# Patient Record
Sex: Male | Born: 1940 | Race: White | Hispanic: No | Smoking: Current every day smoker
Health system: Southern US, Community
[De-identification: ages and names within clinical notes are randomized; demographics above are authoritative.]

## PROBLEM LIST (undated history)

## (undated) DIAGNOSIS — K635 Polyp of colon: Secondary | ICD-10-CM

## (undated) DIAGNOSIS — Z923 Personal history of irradiation: Secondary | ICD-10-CM

## (undated) DIAGNOSIS — B029 Zoster without complications: Secondary | ICD-10-CM

## (undated) DIAGNOSIS — I10 Essential (primary) hypertension: Secondary | ICD-10-CM

## (undated) DIAGNOSIS — E785 Hyperlipidemia, unspecified: Secondary | ICD-10-CM

## (undated) DIAGNOSIS — C61 Malignant neoplasm of prostate: Secondary | ICD-10-CM

## (undated) DIAGNOSIS — G40909 Epilepsy, unspecified, not intractable, without status epilepticus: Secondary | ICD-10-CM

## (undated) HISTORY — PX: COLONOSCOPY: SHX174

## (undated) HISTORY — PX: OTHER SURGICAL HISTORY: SHX169

---

## 1999-07-07 ENCOUNTER — Ambulatory Visit (HOSPITAL_COMMUNITY): Admission: RE | Admit: 1999-07-07 | Discharge: 1999-07-07 | Payer: Self-pay | Admitting: Gastroenterology

## 2005-01-08 ENCOUNTER — Ambulatory Visit: Payer: Self-pay | Admitting: Internal Medicine

## 2005-02-20 ENCOUNTER — Ambulatory Visit: Payer: Self-pay | Admitting: Internal Medicine

## 2005-07-05 ENCOUNTER — Inpatient Hospital Stay (HOSPITAL_COMMUNITY): Admission: EM | Admit: 2005-07-05 | Discharge: 2005-07-06 | Payer: Self-pay | Admitting: Emergency Medicine

## 2005-07-05 ENCOUNTER — Ambulatory Visit: Payer: Self-pay | Admitting: Infectious Diseases

## 2005-07-09 ENCOUNTER — Encounter: Payer: Self-pay | Admitting: Interventional Radiology

## 2005-09-01 ENCOUNTER — Ambulatory Visit (HOSPITAL_COMMUNITY): Admission: RE | Admit: 2005-09-01 | Discharge: 2005-09-02 | Payer: Self-pay | Admitting: Interventional Radiology

## 2005-09-15 ENCOUNTER — Encounter: Payer: Self-pay | Admitting: Interventional Radiology

## 2006-01-11 ENCOUNTER — Ambulatory Visit (HOSPITAL_COMMUNITY): Admission: RE | Admit: 2006-01-11 | Discharge: 2006-01-11 | Payer: Self-pay | Admitting: Interventional Radiology

## 2010-07-06 ENCOUNTER — Encounter: Payer: Self-pay | Admitting: Interventional Radiology

## 2011-09-29 ENCOUNTER — Ambulatory Visit: Payer: Self-pay | Admitting: Radiation Oncology

## 2011-09-29 ENCOUNTER — Ambulatory Visit: Payer: Self-pay

## 2011-12-25 ENCOUNTER — Encounter: Payer: Self-pay | Admitting: Internal Medicine

## 2012-09-29 ENCOUNTER — Encounter: Payer: Self-pay | Admitting: Internal Medicine

## 2014-05-17 ENCOUNTER — Other Ambulatory Visit (HOSPITAL_COMMUNITY): Payer: Self-pay | Admitting: Urology

## 2014-05-17 DIAGNOSIS — C61 Malignant neoplasm of prostate: Secondary | ICD-10-CM

## 2014-06-21 ENCOUNTER — Ambulatory Visit (HOSPITAL_COMMUNITY)
Admission: RE | Admit: 2014-06-21 | Discharge: 2014-06-21 | Disposition: A | Discharge status: Home / Self Care | Payer: Medicare HMO | Source: Ambulatory Visit | Attending: Urology | Admitting: Urology

## 2014-06-21 DIAGNOSIS — K5730 Diverticulosis of large intestine without perforation or abscess without bleeding: Secondary | ICD-10-CM | POA: Insufficient documentation

## 2014-06-21 DIAGNOSIS — C61 Malignant neoplasm of prostate: Secondary | ICD-10-CM | POA: Insufficient documentation

## 2014-06-21 LAB — POCT I-STAT CREATININE: Creatinine, Ser: 1 mg/dL (ref 0.50–1.35)

## 2014-06-21 MED ORDER — GADOBENATE DIMEGLUMINE 529 MG/ML IV SOLN
20.0000 mL | Freq: Once | INTRAVENOUS | Status: AC | PRN
Start: 2014-06-21 — End: 2014-06-21
  Administered 2014-06-21: 18 mL via INTRAVENOUS

## 2014-06-27 HISTORY — PX: PROSTATE BIOPSY: SHX241

## 2014-07-12 ENCOUNTER — Ambulatory Visit: Payer: Medicare Other | Admitting: Radiation Oncology

## 2014-07-12 ENCOUNTER — Ambulatory Visit: Payer: Medicare Other

## 2014-07-17 ENCOUNTER — Ambulatory Visit: Payer: Medicare Other

## 2014-07-17 ENCOUNTER — Ambulatory Visit: Payer: Medicare Other | Admitting: Radiation Oncology

## 2014-07-19 ENCOUNTER — Encounter: Payer: Self-pay | Admitting: Radiation Oncology

## 2014-07-19 NOTE — Progress Notes (Signed)
GU Location of Tumor / Histology: Adenocarcinoma of the Prostate    If Prostate Cancer, Gleason Score is (3 + 4) and PSA is (10.8)    Past/Anticipated interventions by urology, if any: Biopsy of the Prostate  Past/Anticipated interventions by medical oncology, if any:   Weight changes, if any: Lost 7 lbs in the past 5 months - deliberate weight loss   Bowel/Bladder complaints, if any: Nocturia x 2-3, Intermittent urinary stream, denies dysuria  Nausea/Vomiting, if any: No  Pain issues, if any:  None  SAFETY ISSUES:  Prior radiation? NO  Pacemaker/ICD? NO  Possible current pregnancy? N/A  Is the patient on methotrexate? NO  Current Complaints / other details:  Scheduled for Bone Scan on 08/03/2016

## 2014-07-20 ENCOUNTER — Encounter: Payer: Self-pay | Admitting: Radiation Oncology

## 2014-07-23 ENCOUNTER — Other Ambulatory Visit (HOSPITAL_COMMUNITY): Payer: Self-pay | Admitting: Urology

## 2014-07-23 DIAGNOSIS — C61 Malignant neoplasm of prostate: Secondary | ICD-10-CM

## 2014-07-24 ENCOUNTER — Ambulatory Visit: Payer: Medicare HMO

## 2014-07-24 ENCOUNTER — Ambulatory Visit
Admission: RE | Admit: 2014-07-24 | Discharge: 2014-07-24 | Disposition: A | Discharge status: Home / Self Care | Payer: Medicare HMO | Source: Ambulatory Visit | Attending: Radiation Oncology | Admitting: Radiation Oncology

## 2014-07-24 ENCOUNTER — Encounter: Payer: Self-pay | Admitting: Radiation Oncology

## 2014-07-24 ENCOUNTER — Inpatient Hospital Stay: Admission: RE | Admit: 2014-07-24 | Payer: Medicare Other | Source: Ambulatory Visit | Admitting: Radiation Oncology

## 2014-07-24 ENCOUNTER — Institutional Professional Consult (permissible substitution): Payer: Medicare Other | Admitting: Radiation Oncology

## 2014-07-24 VITALS — BP 131/69 | HR 105 | Temp 98.3°F | Ht 69.0 in | Wt 188.9 lb

## 2014-07-24 DIAGNOSIS — C61 Malignant neoplasm of prostate: Secondary | ICD-10-CM | POA: Insufficient documentation

## 2014-07-24 HISTORY — DX: Malignant neoplasm of prostate: C61

## 2014-07-24 HISTORY — DX: Hyperlipidemia, unspecified: E78.5

## 2014-07-24 HISTORY — DX: Essential (primary) hypertension: I10

## 2014-07-24 HISTORY — DX: Polyp of colon: K63.5

## 2014-07-24 HISTORY — DX: Zoster without complications: B02.9

## 2014-07-24 NOTE — Progress Notes (Addendum)
[facility] NEW PATIENT EVALUATION  Name: Carlos Wise MRN: [id number]  Date:   07/24/2014           DOB: 1941-04-08  Status: outpatient   CC:   Carlos Wise, * Dr. Daiva Eves, Dr. 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 male who is seen today through the courtesy of Dr. 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  I send he was initially diagnosed with prostate cancer in March 2013 when he presented with an elevated PSA of 6.64.  He did one biopsy diagnostic for Gleason 7 (3+4) involving 50% of one core from the left lateral mid gland.  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  I see that his PSA was 8.2 in March 2014.  More recently, his PSA rose to 11.04 and he was seen by Dr. Gaynelle Arabian this past December.  MR of the prostate on 06/21/2014  showed a 1.6 cm irregular peripheral zone nodule in the left lateral mid gland consistent with a high-grade carcinoma.  There was no definite extra prostatic tumor extension, seminal vesicle involvement or pelvic metastatic disease.  On 06/27/2014  he underwent ultrasound-guided biopsies by Dr. Gaynelle Arabian finding Gleason 8 (4+4) involving 50% of one core from left lateral base, 70% of one core from the left base, 60%  each of 2 cores from left lateral mid gland. He also had Gleason 7 (4+3) involving less than 5% of one core from the left mid gland and 20% of one core from the left lateral apex. His gland volume was approximately 64.3 mL.  His staging workup includes a bone scan which is scheduled for February 19.  He is doing reasonably well from a GU and GI standpoint.  His I PSS score is 5.  He claims to be potent.  He was seen by Dr. Alinda Money last week to discuss robotic surgery.    PREVIOUS RADIATION THERAPY: No   PAST  MEDICAL HISTORY:  has a past medical history of Prostate cancer; Hyperlipemia; Herpes zoster; Hypertension; and Intestinal polyp.     PAST SURGICAL HISTORY:  Past Surgical History  Procedure Laterality Date  . Carotid artery catherization    . Prostate biopsy  06/27/2014     FAMILY HISTORY: family history includes Colon cancer in his mother; Heart attack in his father; Prostate cancer in his father.  His father was diagnosed with prostate cancer in his early 78s, and died of a heart attack at age 48.  His mother died from metastatic colon cancer and 36.    SOCIAL HISTORY:  reports that he has quit smoking. He does not have any smokeless tobacco history on file. He reports that he drinks alcohol. He reports that he does not use illicit drugs.  Married, no children.  Retired Hotel manager.    ALLERGIES: Penicillins   MEDICATIONS:  Current Outpatient Prescriptions  Medication Sig Dispense Refill  . amLODipine (NORVASC) 5 MG tablet Take 5 mg by mouth daily.    Marland Kitchen aspirin 81 MG tablet Take 81 mg by mouth daily.    . cholecalciferol (VITAMIN D) 400 UNITS TABS tablet Take 400 Units by mouth. Vitamin D3    . Cyanocobalamin (VITAMIN B-12 ER PO) Take by mouth.    . escitalopram (LEXAPRO) 10 MG tablet Take 10 mg  by mouth daily.    . Multiple Vitamins-Minerals (CENTRUM ADULTS PO) Take 1 tablet by mouth daily.    . simvastatin (ZOCOR) 40 MG tablet Take 40 mg by mouth daily.     No current facility-administered medications for this encounter.     REVIEW OF SYSTEMS:  Pertinent items are noted in HPI.    PHYSICAL EXAM:  height is 5\' 9"  (1.753 m) and weight is 188 lb 14.4 oz (85.684 kg). His temperature is 98.3 F (36.8 C). His blood pressure is 131/69 and his pulse is 105.   Head and neck examination: Grossly unremarkable.  Nodes: Without palpable cervical or supra-clavicular lymphadenopathy.  Chest: Lungs clear.  Abdomen: Without hepatomegaly.  Rectal:  the prostate gland is normal in size and is  without focal induration or nodularity.  Extremities: Without edema.     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 I explained to the patient and his wife that his prognosis is related to his stage, PSA level, and Gleason score.  His stage is favorable while his PSA level is of intermediate favorability and his Gleason score is unfavorable.  He has high-risk disease.  He is at significant risk for extraprostatic extension, seminal vesicle involvement and/or occult metastatic disease.  He will complete his staging workup with a bone scan of February 19.  We discussed management options which include robotic surgery versus radiation therapy.  Radiation therapy options include 5 weeks of external beam followed by seed implantation or 8 weeks of external beam/IMRT.  We discussed the potential acute and late toxicities of radiation therapy.  We also discussed the role of androgen deprivation therapy which is typically given for 2 years when given with radiation therapy.  We discussed bladder filling to minimize radiation related toxicity.  We discussed the fact that if he has surgery he would not require androgen deprivation therapy, and he would have a pathologic stage which could guide [place] with respect to need for possible adjuvant radiation therapy.  Radiation therapy with external beam/IMRT would obviously be more time-consuming but may be better tolerated considering his age of 7.  I do recommend androgen deprivation therapy and we discussed the timing of radiation therapy which would not be until late spring.  Lastly, we discussed placement of 3 gold seed markers for image guidance.  He was given information for review.      PLAN: As discussed above.  He will contact me if he wants to consider  radiation therapy.  Otherwise, he will make plans for surgery with Dr. Alinda Money.   I spent 60  minutes face to face with the patient and more than 50% of that time was spent in counseling and/or coordination of care.

## 2014-07-24 NOTE — Addendum Note (Signed)
Encounter addended by: Deirdre Evener, RN on: 07/24/2014  5:15 PM<BR>     Documentation filed: Charges VN

## 2014-07-24 NOTE — Addendum Note (Signed)
Encounter addended by: Deirdre Evener, RN on: 07/24/2014  6:37 PM<BR>     Documentation filed: Inpatient Document Flowsheet, Flowsheet VN, Charges VN

## 2014-08-03 ENCOUNTER — Encounter (HOSPITAL_COMMUNITY)
Admission: RE | Admit: 2014-08-03 | Discharge: 2014-08-03 | Disposition: A | Discharge status: Home / Self Care | Payer: Medicare HMO | Source: Ambulatory Visit | Attending: Urology | Admitting: Urology

## 2014-08-03 DIAGNOSIS — C61 Malignant neoplasm of prostate: Secondary | ICD-10-CM | POA: Insufficient documentation

## 2014-08-14 ENCOUNTER — Other Ambulatory Visit (HOSPITAL_COMMUNITY): Payer: Self-pay | Admitting: Urology

## 2014-08-14 DIAGNOSIS — R948 Abnormal results of function studies of other organs and systems: Secondary | ICD-10-CM

## 2014-08-28 ENCOUNTER — Other Ambulatory Visit (HOSPITAL_COMMUNITY): Payer: Self-pay | Admitting: Urology

## 2014-08-28 ENCOUNTER — Ambulatory Visit (HOSPITAL_COMMUNITY)
Admission: RE | Admit: 2014-08-28 | Discharge: 2014-08-28 | Disposition: A | Discharge status: Home / Self Care | Payer: Medicare HMO | Source: Ambulatory Visit | Attending: Urology | Admitting: Urology

## 2014-08-28 DIAGNOSIS — R948 Abnormal results of function studies of other organs and systems: Secondary | ICD-10-CM | POA: Insufficient documentation

## 2014-08-28 DIAGNOSIS — C61 Malignant neoplasm of prostate: Secondary | ICD-10-CM | POA: Diagnosis present

## 2014-08-28 LAB — POCT I-STAT CREATININE: CREATININE: 1 mg/dL (ref 0.50–1.35)

## 2014-08-28 MED ORDER — GADOBENATE DIMEGLUMINE 529 MG/ML IV SOLN
15.0000 mL | Freq: Once | INTRAVENOUS | Status: AC | PRN
Start: 2014-08-28 — End: 2014-08-28
  Administered 2014-08-28: 15 mL via INTRAVENOUS

## 2014-09-05 ENCOUNTER — Encounter: Payer: Self-pay | Admitting: Radiation Oncology

## 2014-09-05 ENCOUNTER — Telehealth: Payer: Self-pay | Admitting: *Deleted

## 2014-09-05 NOTE — Telephone Encounter (Signed)
CALLED PATIENT TO INFORM OF [facility] APPT. FOR 11-07-14, LVM FOR A RETURN CALL

## 2014-09-05 NOTE — Progress Notes (Signed)
Chart note: Dr. Arlyn Leak nurse, Carlos Wise, called to tell me that [name] wanted to proceed with external beam/IMRT.  I spoke with Carlos Wise, and he is scheduled for a Vantus implant in early April.  He will also place his 3 gold seed markers at the same time.  I will see Carlos Wise back for a follow-up visit in 2 months and go through the planning process for external beam/IMRT.

## 2014-09-21 ENCOUNTER — Telehealth: Payer: Self-pay | Admitting: *Deleted

## 2014-09-21 NOTE — Telephone Encounter (Signed)
CALLED PATIENT TO INFORM THAT APPT. HAS BEEN MOVED TO 11-14-14 - 1:30 PM  FOR 2:00 PM APPT., LVM FOR A RETURN  CALL

## 2014-10-03 ENCOUNTER — Encounter: Payer: Self-pay | Admitting: Internal Medicine

## 2014-11-07 ENCOUNTER — Ambulatory Visit: Payer: Medicare HMO | Admitting: Radiation Oncology

## 2014-11-07 ENCOUNTER — Ambulatory Visit: Payer: Medicare HMO

## 2014-11-14 ENCOUNTER — Ambulatory Visit: Payer: Medicare HMO | Admitting: Radiation Oncology

## 2014-11-14 ENCOUNTER — Ambulatory Visit: Payer: Medicare HMO

## 2014-11-15 ENCOUNTER — Ambulatory Visit: Payer: Medicare HMO | Admitting: Radiation Oncology

## 2014-11-20 ENCOUNTER — Ambulatory Visit
Admission: RE | Admit: 2014-11-20 | Discharge: 2014-11-20 | Disposition: A | Discharge status: Home / Self Care | Payer: Medicare HMO | Source: Ambulatory Visit | Attending: Radiation Oncology | Admitting: Radiation Oncology

## 2014-11-20 ENCOUNTER — Encounter: Payer: Self-pay | Admitting: Radiation Oncology

## 2014-11-20 VITALS — BP 137/78 | HR 97 | Temp 99.0°F | Ht 69.0 in | Wt 195.7 lb

## 2014-11-20 DIAGNOSIS — C61 Malignant neoplasm of prostate: Secondary | ICD-10-CM

## 2014-11-20 NOTE — Progress Notes (Signed)
CC: Dr. Katrine Coho, Dr. Daiva Eves  Follow-up note:  Diagnosis: Stage TIc high-risk adenocarcinoma prostate  History: [name] is a pleasant 74 year old male who is seen today for review and scheduling of his radiation therapy in the management of his stage TIc high-risk adenocarcinoma prostate.  I first saw the patient in consultation on 07/24/2014. He was initially diagnosed with prostate cancer in March 2013 when he presented with an elevated PSA of 6.64. He did one biopsy diagnostic for Gleason 7 (3+4) involving 50% of one core from the left lateral mid gland. 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 I see that his PSA was 8.2 in March 2014. More recently, his PSA rose to 11.04 and he was seen by Dr. Gaynelle Arabian this past December. MR of the prostate on 06/21/2014 showed a 1.6 cm irregular peripheral zone nodule in the left lateral mid gland consistent with a high-grade carcinoma. There was no definite extra prostatic tumor extension, seminal vesicle involvement or pelvic metastatic disease. On 06/27/2014 he underwent ultrasound-guided biopsies by Dr. Gaynelle Arabian finding Gleason 8 (4+4) involving 50% of one core from left lateral base, 70% of one core from the left base, 60% each of 2 cores from left lateral mid gland. He also had Gleason 7 (4+3) involving less than 5% of one core from the left mid gland and 20% of one core from the left lateral apex. His gland volume was approximately 64.3 mL.His staging workup included negative bone scan on 08/03/2014.  He elected for external beam along with androgen deprivation therapy for 2 years.  He has been doing reasonably well from a GU and GI standpoint.  On 09/25/2014 he had placement of a Vantas implant along his proximal left upper extremity and also placement of 3 gold seed markers for image guidance.  He states that he has minimal hot flashes and only slight fatigue.  He is still having erections.  Physical  examination: Alert and oriented. Filed Vitals:   11/20/14 1230  BP: 137/78  Pulse: 97  Temp: 99 F (37.2 C)   Rectal examination not performed today.  Impression: Stage TIc high-risk adenocarcinoma prostate.  We again reviewed his management options and he confirms his desire to undergo 8 weeks of external beam/IMRT along with androgen deprivation therapy.  I would like to wait until the end of June before beginning his radiation therapy.  This will be her most 3 months since placement of his implant.  I will have him return for CT simulation on June 20.  We discussed the potential acute and late toxicities of radiation therapy.  We discussed bladder filling to minimize urinary treatment-related toxicity.  Plan: As above.  30 minutes was spent face-to-face with the patient, primarily counseling patient and coordinating his care.

## 2014-11-20 NOTE — Progress Notes (Signed)
Carlos Wise is here for reassessment to start radiation therapy. He had placement of his 3 Gold seeds and the [employer] Implant.  He denies any hot flashes, fatigue, nor muscle aches since Vantas implant.

## 2014-12-03 ENCOUNTER — Ambulatory Visit
Admission: RE | Admit: 2014-12-03 | Discharge: 2014-12-03 | Disposition: A | Discharge status: Home / Self Care | Payer: Medicare HMO | Source: Ambulatory Visit | Attending: Radiation Oncology | Admitting: Radiation Oncology

## 2014-12-03 DIAGNOSIS — C61 Malignant neoplasm of prostate: Secondary | ICD-10-CM | POA: Diagnosis not present

## 2014-12-03 NOTE — Progress Notes (Addendum)
Complex simulation/treatment planning note: The patient was taken to the CT simulator. He was placed supine on the CT simulator table. A custom Vac lock immobilization device was constructed. A red rubber tube was placed within the rectal vault. He was then catheterized and contrast instilled into the bladder/urethra. He was then scanned . I chose an isocenter along the central prostate. The CT data set was sent to the MIM Planning system where I contoured his prostate, seminal vesicles, bladder, rectum, and lower rectosigmoid colon. He is now ready for IMRT simulation/treatment planning. I am prescribing 7800 cGy to his prostate PTV which represents the prostate +0.8 cm except for 0.5 cm along the rectum. I prescribing 5600 cGy in 40 sessions to his seminal vesicles which includes the seminal vesicles +0.5 cm.

## 2014-12-04 ENCOUNTER — Encounter: Payer: Self-pay | Admitting: Radiation Oncology

## 2014-12-04 DIAGNOSIS — C61 Malignant neoplasm of prostate: Secondary | ICD-10-CM | POA: Diagnosis not present

## 2014-12-04 NOTE — Progress Notes (Signed)
IMRT simulation/treatment planning note: The patient completed his IMRT treatment planning today.  IMRT was chosen to decrease the risk for both acute and late bladder and rectal toxicity compared to conventional or 3-D conformal radiation therapy.  Dose volume histograms were obtained for the bladder, rectum, and femoral heads in addition to the prostate PTV and seminal vesicle PTV.  We met our departmental guidelines.  I'm prescribing 7800 cGy in 40 sessions to his prostate PTV and 5600 cGy in 40 sessions to his seminal vesicle PTV.  He is being treated with 6 MV photons, dual ARC VMAT IMRT.

## 2014-12-06 ENCOUNTER — Ambulatory Visit: Payer: Medicare HMO | Admitting: Radiation Oncology

## 2014-12-12 ENCOUNTER — Ambulatory Visit
Admission: RE | Admit: 2014-12-12 | Discharge: 2014-12-12 | Disposition: A | Discharge status: Home / Self Care | Payer: Medicare HMO | Source: Ambulatory Visit | Attending: Radiation Oncology | Admitting: Radiation Oncology

## 2014-12-12 DIAGNOSIS — C61 Malignant neoplasm of prostate: Secondary | ICD-10-CM | POA: Diagnosis not present

## 2014-12-13 ENCOUNTER — Ambulatory Visit
Admission: RE | Admit: 2014-12-13 | Discharge: 2014-12-13 | Disposition: A | Discharge status: Home / Self Care | Payer: Medicare HMO | Source: Ambulatory Visit | Attending: Radiation Oncology | Admitting: Radiation Oncology

## 2014-12-13 DIAGNOSIS — C61 Malignant neoplasm of prostate: Secondary | ICD-10-CM | POA: Diagnosis not present

## 2014-12-14 ENCOUNTER — Ambulatory Visit
Admission: RE | Admit: 2014-12-14 | Discharge: 2014-12-14 | Disposition: A | Discharge status: Home / Self Care | Payer: Medicare HMO | Source: Ambulatory Visit | Attending: Radiation Oncology | Admitting: Radiation Oncology

## 2014-12-14 DIAGNOSIS — C61 Malignant neoplasm of prostate: Secondary | ICD-10-CM | POA: Diagnosis not present

## 2014-12-18 ENCOUNTER — Ambulatory Visit
Admission: RE | Admit: 2014-12-18 | Discharge: 2014-12-18 | Disposition: A | Discharge status: Home / Self Care | Payer: Medicare HMO | Source: Ambulatory Visit | Attending: Radiation Oncology | Admitting: Radiation Oncology

## 2014-12-18 ENCOUNTER — Encounter: Payer: Self-pay | Admitting: Radiation Oncology

## 2014-12-18 VITALS — BP 140/76 | HR 97 | Temp 98.6°F | Resp 16 | Ht 69.0 in | Wt 193.9 lb

## 2014-12-18 DIAGNOSIS — C61 Malignant neoplasm of prostate: Secondary | ICD-10-CM

## 2014-12-18 NOTE — Progress Notes (Signed)
Brodey Bonn has completed 4 fractions to his prostate.  He denies pain and hematuria.  He reports occasional dysuria, every "1 out of 5 times I urinate."  He said this started recently.  He reports nocturia 3 times a night.    BP 140/76 mmHg  Pulse 97  Temp(Src) 98.6 F (37 C) (Oral)  Resp 16  Ht 5\' 9"  (1.753 m)  Wt 193 lb 14.4 oz (87.952 kg)  BMI 28.62 kg/m2

## 2014-12-18 NOTE — Progress Notes (Signed)
Weekly Management Note:  Site: Prostate Current Dose:  780  cGy Projected Dose: 7800  cGy  Narrative: The patient is seen today for routine under treatment assessment. CBCT/MVCT images/port films were reviewed. The chart was reviewed.   Bladder filling is satisfactory.  No new GU or GI difficulties.  He does have occasional dysuria which I do not feel is related to his radiation therapy.  He is also having nocturia 3.  Physical Examination:  Filed Vitals:   12/18/14 1105  BP: 140/76  Pulse: 97  Temp: 98.6 F (37 C)  Resp: 16  .  Weight: 193 lb 14.4 oz (87.952 kg).  No change.  Impression: Tolerating radiation therapy well.  I reviewed his cone beam CT scans with him to encouraged comfortable bladder filling.  Plan: Continue radiation therapy as planned.

## 2014-12-18 NOTE — Progress Notes (Signed)
Pt here for patient teaching.  Pt given Radiation and You booklet. Reviewed areas of pertinence such as diarrhea, fatigue, skin changes and urinary and bladder changes . Pt able to give teach back of to pat skin and use unscented/gentle soap,avoid applying anything to skin within 4 hours of treatment. Pt demonstrated understanding and verbalizes understanding of information given and will contact nursing with any questions or concerns.

## 2014-12-19 ENCOUNTER — Ambulatory Visit
Admission: RE | Admit: 2014-12-19 | Discharge: 2014-12-19 | Disposition: A | Discharge status: Home / Self Care | Payer: Medicare HMO | Source: Ambulatory Visit | Attending: Radiation Oncology | Admitting: Radiation Oncology

## 2014-12-19 DIAGNOSIS — C61 Malignant neoplasm of prostate: Secondary | ICD-10-CM | POA: Diagnosis not present

## 2014-12-20 ENCOUNTER — Ambulatory Visit
Admission: RE | Admit: 2014-12-20 | Discharge: 2014-12-20 | Disposition: A | Discharge status: Home / Self Care | Payer: Medicare HMO | Source: Ambulatory Visit | Attending: Radiation Oncology | Admitting: Radiation Oncology

## 2014-12-20 DIAGNOSIS — C61 Malignant neoplasm of prostate: Secondary | ICD-10-CM | POA: Diagnosis not present

## 2014-12-21 ENCOUNTER — Ambulatory Visit
Admission: RE | Admit: 2014-12-21 | Discharge: 2014-12-21 | Disposition: A | Discharge status: Home / Self Care | Payer: Medicare HMO | Source: Ambulatory Visit | Attending: Radiation Oncology | Admitting: Radiation Oncology

## 2014-12-21 DIAGNOSIS — C61 Malignant neoplasm of prostate: Secondary | ICD-10-CM | POA: Diagnosis not present

## 2014-12-24 ENCOUNTER — Ambulatory Visit
Admission: RE | Admit: 2014-12-24 | Discharge: 2014-12-24 | Disposition: A | Discharge status: Home / Self Care | Payer: Medicare HMO | Source: Ambulatory Visit | Attending: Radiation Oncology | Admitting: Radiation Oncology

## 2014-12-24 VITALS — BP 125/70 | HR 101 | Temp 98.1°F | Resp 10 | Wt 194.1 lb

## 2014-12-24 DIAGNOSIS — C61 Malignant neoplasm of prostate: Secondary | ICD-10-CM | POA: Diagnosis not present

## 2014-12-24 NOTE — Progress Notes (Signed)
PAIN: He is currently in no pain. URINARY: Pt reports urinary frequency. Pt states they urinate 2 - 3 times per night.  BOWEL: Pt reports a soft bowel movement everyday/everyother day. OTHER: Pt complains of fatigue.  BP 125/70 mmHg  Pulse 101  Temp(Src) 98.1 F (36.7 C) (Oral)  Resp 10  Wt 194 lb 1.6 oz (88.043 kg)  SpO2 98% Wt Readings from Last 3 Encounters:  12/24/14 194 lb 1.6 oz (88.043 kg)  12/18/14 193 lb 14.4 oz (87.952 kg)  11/20/14 195 lb 11.2 oz (88.769 kg)

## 2014-12-24 NOTE — Progress Notes (Signed)
   Weekly Management Note:  outpatient    ICD-9-CM ICD-10-CM   1. Malignant neoplasm of prostate 185 C61     Current Dose:  15.6 Gy  Projected Dose: 78 Gy   Narrative:  The patient presents for routine under treatment assessment.  CBCT/MVCT images/Port film x-rays were reviewed.  The chart was checked. Doing well. No new complaints   Physical Findings:  weight is 194 lb 1.6 oz (88.043 kg). His oral temperature is 98.1 F (36.7 C). His blood pressure is 125/70 and his pulse is 101. His respiration is 10 and oxygen saturation is 98%.   Wt Readings from Last 3 Encounters:  12/24/14 194 lb 1.6 oz (88.043 kg)  12/18/14 193 lb 14.4 oz (87.952 kg)  11/20/14 195 lb 11.2 oz (88.769 kg)   NAD  Impression:  The patient is tolerating radiotherapy.  Plan:  Continue radiotherapy as planned.    ________________________________   Eppie Gibson, M.D.

## 2014-12-25 ENCOUNTER — Ambulatory Visit
Admission: RE | Admit: 2014-12-25 | Discharge: 2014-12-25 | Disposition: A | Discharge status: Home / Self Care | Payer: Medicare HMO | Source: Ambulatory Visit | Attending: Radiation Oncology | Admitting: Radiation Oncology

## 2014-12-25 DIAGNOSIS — C61 Malignant neoplasm of prostate: Secondary | ICD-10-CM | POA: Diagnosis not present

## 2014-12-26 ENCOUNTER — Ambulatory Visit
Admission: RE | Admit: 2014-12-26 | Discharge: 2014-12-26 | Disposition: A | Discharge status: Home / Self Care | Payer: Medicare HMO | Source: Ambulatory Visit | Attending: Radiation Oncology | Admitting: Radiation Oncology

## 2014-12-26 DIAGNOSIS — C61 Malignant neoplasm of prostate: Secondary | ICD-10-CM | POA: Diagnosis not present

## 2014-12-27 ENCOUNTER — Ambulatory Visit
Admission: RE | Admit: 2014-12-27 | Discharge: 2014-12-27 | Disposition: A | Discharge status: Home / Self Care | Payer: Medicare HMO | Source: Ambulatory Visit | Attending: Radiation Oncology | Admitting: Radiation Oncology

## 2014-12-27 DIAGNOSIS — C61 Malignant neoplasm of prostate: Secondary | ICD-10-CM | POA: Diagnosis not present

## 2014-12-28 ENCOUNTER — Ambulatory Visit
Admission: RE | Admit: 2014-12-28 | Discharge: 2014-12-28 | Disposition: A | Discharge status: Home / Self Care | Payer: Medicare HMO | Source: Ambulatory Visit | Attending: Radiation Oncology | Admitting: Radiation Oncology

## 2014-12-28 DIAGNOSIS — C61 Malignant neoplasm of prostate: Secondary | ICD-10-CM | POA: Diagnosis not present

## 2014-12-31 ENCOUNTER — Ambulatory Visit
Admission: RE | Admit: 2014-12-31 | Discharge: 2014-12-31 | Disposition: A | Discharge status: Home / Self Care | Payer: Medicare HMO | Source: Ambulatory Visit | Attending: Radiation Oncology | Admitting: Radiation Oncology

## 2014-12-31 VITALS — BP 132/72 | HR 98 | Temp 98.5°F | Resp 12 | Wt 192.6 lb

## 2014-12-31 DIAGNOSIS — C61 Malignant neoplasm of prostate: Secondary | ICD-10-CM

## 2014-12-31 NOTE — Progress Notes (Signed)
Weekly Management Note:  Site: Prostate Current Dose:  2535  cGy Projected Dose: 7800  cGy  Narrative: The patient is seen today for routine under treatment assessment. CBCT/MVCT images/port films were reviewed. The chart was reviewed.   Bladder filling is satisfactory.  No new GU or GI difficulties.  Physical Examination:  Filed Vitals:   12/31/14 1019  BP: 132/72  Pulse: 98  Temp: 98.5 F (36.9 C)  Resp: 12  .  Weight: 192 lb 9.6 oz (87.363 kg).  No change.  Impression: Tolerating radiation therapy well.  Plan: Continue radiation therapy as planned.

## 2014-12-31 NOTE — Progress Notes (Signed)
PAIN: He is currently in no pain.  URINARY: Pt states they urinate 2 - 3 times per night.  BOWEL: Pt reports a soft bowel movement everyday/everyother day.  BP 132/72 mmHg  Pulse 98  Temp(Src) 98.5 F (36.9 C) (Oral)  Resp 12  Wt 192 lb 9.6 oz (87.363 kg)  SpO2 100% Wt Readings from Last 3 Encounters:  12/31/14 192 lb 9.6 oz (87.363 kg)  12/24/14 194 lb 1.6 oz (88.043 kg)  12/18/14 193 lb 14.4 oz (87.952 kg)

## 2015-01-01 ENCOUNTER — Ambulatory Visit
Admission: RE | Admit: 2015-01-01 | Discharge: 2015-01-01 | Disposition: A | Discharge status: Home / Self Care | Payer: Medicare HMO | Source: Ambulatory Visit | Attending: Radiation Oncology | Admitting: Radiation Oncology

## 2015-01-01 DIAGNOSIS — C61 Malignant neoplasm of prostate: Secondary | ICD-10-CM | POA: Diagnosis not present

## 2015-01-02 ENCOUNTER — Ambulatory Visit
Admission: RE | Admit: 2015-01-02 | Discharge: 2015-01-02 | Disposition: A | Discharge status: Home / Self Care | Payer: Medicare HMO | Source: Ambulatory Visit | Attending: Radiation Oncology | Admitting: Radiation Oncology

## 2015-01-02 DIAGNOSIS — C61 Malignant neoplasm of prostate: Secondary | ICD-10-CM | POA: Diagnosis not present

## 2015-01-03 ENCOUNTER — Ambulatory Visit
Admission: RE | Admit: 2015-01-03 | Discharge: 2015-01-03 | Disposition: A | Discharge status: Home / Self Care | Payer: Medicare HMO | Source: Ambulatory Visit | Attending: Radiation Oncology | Admitting: Radiation Oncology

## 2015-01-03 DIAGNOSIS — C61 Malignant neoplasm of prostate: Secondary | ICD-10-CM | POA: Diagnosis not present

## 2015-01-04 ENCOUNTER — Ambulatory Visit
Admission: RE | Admit: 2015-01-04 | Discharge: 2015-01-04 | Disposition: A | Discharge status: Home / Self Care | Payer: Medicare HMO | Source: Ambulatory Visit | Attending: Radiation Oncology | Admitting: Radiation Oncology

## 2015-01-04 DIAGNOSIS — C61 Malignant neoplasm of prostate: Secondary | ICD-10-CM | POA: Diagnosis not present

## 2015-01-07 ENCOUNTER — Ambulatory Visit
Admission: RE | Admit: 2015-01-07 | Discharge: 2015-01-07 | Disposition: A | Discharge status: Home / Self Care | Payer: Medicare HMO | Source: Ambulatory Visit | Attending: Radiation Oncology | Admitting: Radiation Oncology

## 2015-01-07 ENCOUNTER — Encounter: Payer: Self-pay | Admitting: Radiation Oncology

## 2015-01-07 VITALS — BP 131/77 | HR 90 | Temp 98.7°F | Resp 20 | Ht 69.0 in | Wt 192.4 lb

## 2015-01-07 DIAGNOSIS — C61 Malignant neoplasm of prostate: Secondary | ICD-10-CM | POA: Diagnosis not present

## 2015-01-07 NOTE — Progress Notes (Signed)
Weekly Management Note:  Site: prostate Current Dose:   3510  cGy Projected Dose:  7800  cGy  Narrative: The patient is seen today for routine under treatment assessment. CBCT/MVCT images/port films were reviewed. The chart was reviewed.    Bladder filling is satisfactory. He does have nocturia 2-4 along with some urinary frequency.  His stream is occasionally weak. No GI difficulties.  Physical Examination:  Filed Vitals:   01/07/15 1029  BP: 131/77  Pulse: 90  Temp: 98.7 F (37.1 C)  Resp: 20  .  Weight: 192 lb 6.4 oz (87.272 kg).  No change.  Impression: Tolerating radiation therapy well.  He may benefit from an alpha blocker but he would like to avoid this for the time being.  Plan: Continue radiation therapy as planned.

## 2015-01-07 NOTE — Progress Notes (Signed)
Carlos Wise has compelled 18 fractions to his prostate.  He denies pain.  He reports occasional burning with urination.  He reports urinary frequency.  He reports getting up 2-4 times per night to urinate.  He reports his urinary stream is occasionally weak.  He denies hematuria.  He denies diarrhea and fatigue.  BP 131/77 mmHg  Pulse 90  Temp(Src) 98.7 F (37.1 C) (Oral)  Resp 20  Ht 5\' 9"  (1.753 m)  Wt 192 lb 6.4 oz (87.272 kg)  BMI 28.40 kg/m2

## 2015-01-08 ENCOUNTER — Ambulatory Visit
Admission: RE | Admit: 2015-01-08 | Discharge: 2015-01-08 | Disposition: A | Discharge status: Home / Self Care | Payer: Medicare HMO | Source: Ambulatory Visit | Attending: Radiation Oncology | Admitting: Radiation Oncology

## 2015-01-08 DIAGNOSIS — C61 Malignant neoplasm of prostate: Secondary | ICD-10-CM | POA: Diagnosis not present

## 2015-01-09 ENCOUNTER — Ambulatory Visit
Admission: RE | Admit: 2015-01-09 | Discharge: 2015-01-09 | Disposition: A | Discharge status: Home / Self Care | Payer: Medicare HMO | Source: Ambulatory Visit | Attending: Radiation Oncology | Admitting: Radiation Oncology

## 2015-01-09 DIAGNOSIS — C61 Malignant neoplasm of prostate: Secondary | ICD-10-CM | POA: Diagnosis not present

## 2015-01-10 ENCOUNTER — Ambulatory Visit
Admission: RE | Admit: 2015-01-10 | Discharge: 2015-01-10 | Disposition: A | Discharge status: Home / Self Care | Payer: Medicare HMO | Source: Ambulatory Visit | Attending: Radiation Oncology | Admitting: Radiation Oncology

## 2015-01-10 DIAGNOSIS — C61 Malignant neoplasm of prostate: Secondary | ICD-10-CM | POA: Diagnosis not present

## 2015-01-11 ENCOUNTER — Ambulatory Visit
Admission: RE | Admit: 2015-01-11 | Discharge: 2015-01-11 | Disposition: A | Discharge status: Home / Self Care | Payer: Medicare HMO | Source: Ambulatory Visit | Attending: Radiation Oncology | Admitting: Radiation Oncology

## 2015-01-11 ENCOUNTER — Encounter: Payer: Self-pay | Admitting: *Deleted

## 2015-01-11 VITALS — BP 139/65 | HR 97 | Temp 98.4°F | Resp 12 | Wt 195.1 lb

## 2015-01-11 DIAGNOSIS — C61 Malignant neoplasm of prostate: Secondary | ICD-10-CM | POA: Diagnosis not present

## 2015-01-11 MED ORDER — TAMSULOSIN HCL 0.4 MG PO CAPS: 0.4000 mg | ORAL_CAPSULE | Freq: Every day | ORAL | Status: DC

## 2015-01-11 NOTE — Progress Notes (Signed)
PAIN: He is currently in no pain. URINARY: Pt reports urinary frequency, urgency, incontinence and dribbling. Pt states they urinate 9 times.  BOWEL: Pt reports a soft bowel movement everyday/everyother day. OTHER: Not taking Flomax BP 139/65 mmHg  Pulse 97  Temp(Src) 98.4 F (36.9 C) (Oral)  Resp 12  Wt 195 lb 1.6 oz (88.497 kg)  SpO2 99% Wt Readings from Last 3 Encounters:  01/11/15 195 lb 1.6 oz (88.497 kg)  01/07/15 192 lb 6.4 oz (87.272 kg)  12/31/14 192 lb 9.6 oz (87.363 kg)

## 2015-01-11 NOTE — Progress Notes (Signed)
   Department of Radiation Oncology  Phone:  [phone] Fax:        [phone]  Weekly Treatment Note    Name: Carlos Wise Date: 01/11/2015 MRN: [id number] DOB: February 14, 1941   Current dose: 40.95 Gy  Current fraction: 21   MEDICATIONS: Current Outpatient Prescriptions  Medication Sig Dispense Refill  . amLODipine (NORVASC) 5 MG tablet Take 5 mg by mouth daily.    Marland Kitchen aspirin 81 MG tablet Take 81 mg by mouth daily.    . cholecalciferol (VITAMIN D) 400 UNITS TABS tablet Take 400 Units by mouth. Vitamin D3    . Cyanocobalamin (VITAMIN B-12 ER PO) Take by mouth.    . escitalopram (LEXAPRO) 10 MG tablet Take 10 mg by mouth daily.    . Multiple Vitamins-Minerals (CENTRUM ADULTS PO) Take 1 tablet by mouth daily.    . simvastatin (ZOCOR) 40 MG tablet Take 40 mg by mouth daily.     No current facility-administered medications for this encounter.     ALLERGIES: Penicillins   LABORATORY DATA:  No results found for: WBC, HGB, HCT, MCV, PLT No results found for: NA, K, CL, CO2 No results found for: ALT, AST, GGT, ALKPHOS, BILITOT   NARRATIVE: SAMIR ISHAQ was seen today for weekly treatment management. The chart was checked and the patient's films were reviewed. PAIN:  He is currently in no pain.  URINARY:  Pt reports urinary frequency, urgency, incontinence and dribbling. Pt states nocturia x 9. BOWEL:  Pt reports a soft bowel movement everyday or every other day.  OTHER:  Not taking Flomax  PHYSICAL EXAMINATION: weight is 195 lb 1.6 oz (88.497 kg). His oral temperature is 98.4 F (36.9 C). His blood pressure is 139/65 and his pulse is 97. His respiration is 12 and oxygen saturation is 99%.  Alert and oriented times three.  ASSESSMENT: The patient is doing satisfactorily with treatment.  PLAN: We will continue with the patient's radiation treatment as planned. I will prescribe Flomax for the pt's urinary symptoms. The pt uses [facility].  This document  serves as a record of services personally performed by Kyung Rudd, MD. It was created on his behalf by Darcus Austin, a trained medical scribe. The creation of this record is based on the scribe's personal observations and the provider's statements to them. This document has been checked and approved by the attending provider.

## 2015-01-11 NOTE — Progress Notes (Signed)
Received call from pt stating having problems going to the bathroom constantly and he doesn't know if he can come in for his treatment today.  Called pt.  Pt reports they have voided 9 times since midnight to 8.  Encouraged pt to try and come in so he could be seen by one of our physicians. He said he lives 45 minutes away and will be here as soon as he can.  Notified Dr. Lisbeth Renshaw and PUT appointment scheduled.

## 2015-01-14 ENCOUNTER — Ambulatory Visit
Admission: RE | Admit: 2015-01-14 | Discharge: 2015-01-14 | Disposition: A | Discharge status: Home / Self Care | Payer: Medicare HMO | Source: Ambulatory Visit | Attending: Radiation Oncology | Admitting: Radiation Oncology

## 2015-01-14 DIAGNOSIS — C61 Malignant neoplasm of prostate: Secondary | ICD-10-CM | POA: Diagnosis not present

## 2015-01-15 ENCOUNTER — Ambulatory Visit
Admission: RE | Admit: 2015-01-15 | Discharge: 2015-01-15 | Disposition: A | Discharge status: Home / Self Care | Payer: Medicare HMO | Source: Ambulatory Visit | Attending: Radiation Oncology | Admitting: Radiation Oncology

## 2015-01-15 ENCOUNTER — Encounter: Payer: Self-pay | Admitting: Radiation Oncology

## 2015-01-15 VITALS — BP 135/76 | HR 96 | Temp 98.2°F | Resp 20 | Wt 194.8 lb

## 2015-01-15 DIAGNOSIS — C61 Malignant neoplasm of prostate: Secondary | ICD-10-CM

## 2015-01-15 NOTE — Progress Notes (Signed)
Weekly rad txs 24/40 completed, flomax has helped a lot stated patient, nocturia x2, no dysuria,hematuria, bowels normal, appetite good, mild fatigue  10:18 AM BP 135/76 mmHg  Pulse 96  Temp(Src) 98.2 F (36.8 C) (Oral)  Resp 20  Wt 194 lb 12.8 oz (88.361 kg)  Wt Readings from Last 3 Encounters:  01/15/15 194 lb 12.8 oz (88.361 kg)  01/11/15 195 lb 1.6 oz (88.497 kg)  01/07/15 192 lb 6.4 oz (87.272 kg)

## 2015-01-15 NOTE — Progress Notes (Signed)
Weekly Management Note:  Site: Prostate Current Dose:  4680  cGy Projected Dose: 7800  cGy  Narrative: The patient is seen today for routine under treatment assessment. CBCT/MVCT images/port films were reviewed. The chart was reviewed.   Bladder filling satisfactory.  No new GU or GI difficulty.  Tamsulosin has been quite helpful.  Physical Examination:  Filed Vitals:   01/15/15 1016  BP: 135/76  Pulse: 96  Temp: 98.2 F (36.8 C)  Resp: 20  .  Weight: 194 lb 12.8 oz (88.361 kg).  No change.  Impression: Tolerating radiation therapy well.  Plan: Continue radiation therapy as planned.

## 2015-01-16 ENCOUNTER — Ambulatory Visit
Admission: RE | Admit: 2015-01-16 | Discharge: 2015-01-16 | Disposition: A | Discharge status: Home / Self Care | Payer: Medicare HMO | Source: Ambulatory Visit | Attending: Radiation Oncology | Admitting: Radiation Oncology

## 2015-01-16 DIAGNOSIS — C61 Malignant neoplasm of prostate: Secondary | ICD-10-CM | POA: Diagnosis not present

## 2015-01-17 ENCOUNTER — Ambulatory Visit
Admission: RE | Admit: 2015-01-17 | Discharge: 2015-01-17 | Disposition: A | Discharge status: Home / Self Care | Payer: Medicare HMO | Source: Ambulatory Visit | Attending: Radiation Oncology | Admitting: Radiation Oncology

## 2015-01-17 DIAGNOSIS — C61 Malignant neoplasm of prostate: Secondary | ICD-10-CM | POA: Diagnosis not present

## 2015-01-18 ENCOUNTER — Ambulatory Visit
Admission: RE | Admit: 2015-01-18 | Discharge: 2015-01-18 | Disposition: A | Discharge status: Home / Self Care | Payer: Medicare HMO | Source: Ambulatory Visit | Attending: Radiation Oncology | Admitting: Radiation Oncology

## 2015-01-18 DIAGNOSIS — C61 Malignant neoplasm of prostate: Secondary | ICD-10-CM | POA: Diagnosis not present

## 2015-01-21 ENCOUNTER — Ambulatory Visit
Admission: RE | Admit: 2015-01-21 | Discharge: 2015-01-21 | Disposition: A | Discharge status: Home / Self Care | Payer: Medicare HMO | Source: Ambulatory Visit | Attending: Radiation Oncology | Admitting: Radiation Oncology

## 2015-01-21 VITALS — BP 106/62 | HR 89 | Temp 98.4°F | Resp 12 | Wt 194.0 lb

## 2015-01-21 DIAGNOSIS — E785 Hyperlipidemia, unspecified: Secondary | ICD-10-CM | POA: Insufficient documentation

## 2015-01-21 DIAGNOSIS — C61 Malignant neoplasm of prostate: Secondary | ICD-10-CM | POA: Insufficient documentation

## 2015-01-21 DIAGNOSIS — Z880 Allergy status to penicillin: Secondary | ICD-10-CM | POA: Diagnosis not present

## 2015-01-21 DIAGNOSIS — Z510 Encounter for antineoplastic radiation therapy: Secondary | ICD-10-CM | POA: Insufficient documentation

## 2015-01-21 DIAGNOSIS — I10 Essential (primary) hypertension: Secondary | ICD-10-CM | POA: Diagnosis not present

## 2015-01-21 NOTE — Progress Notes (Signed)
PAIN: He is currently in no pain.  URINARY: Pt reports no changes to urinary. Pt states they urinate 2 - 3 times per night.  BOWEL: Pt reports a soft bowel movement everyday/everyother day. OTHER: Pt complains of fatigue.  Pt wife fell on Friday night and broke her hip.  She is currently admitted at [facility] recovering from surgery.   BP 106/62 mmHg  Pulse 89  Temp(Src) 98.4 F (36.9 C) (Oral)  Resp 12  Wt 194 lb (87.998 kg)  SpO2 98% Wt Readings from Last 3 Encounters:  01/21/15 194 lb (87.998 kg)  01/15/15 194 lb 12.8 oz (88.361 kg)  01/11/15 195 lb 1.6 oz (88.497 kg)

## 2015-01-21 NOTE — Progress Notes (Signed)
   Weekly Management Note:  outpatient    ICD-9-CM ICD-10-CM   1. Malignant neoplasm of prostate 185 C61     Current Dose:  54.6 Gy  Projected Dose: 78 Gy   Narrative:  The patient presents for routine under treatment assessment.  CBCT/MVCT images/Port film x-rays were reviewed.  The chart was checked. No new compalints  Physical Findings:  weight is 194 lb (87.998 kg). His oral temperature is 98.4 F (36.9 C). His blood pressure is 106/62 and his pulse is 89. His respiration is 12 and oxygen saturation is 98%.   Wt Readings from Last 3 Encounters:  01/21/15 194 lb (87.998 kg)  01/15/15 194 lb 12.8 oz (88.361 kg)  01/11/15 195 lb 1.6 oz (88.497 kg)   NAD  Impression:  The patient is tolerating radiotherapy.  Plan:  Continue radiotherapy as planned.    ________________________________   Eppie Gibson, M.D.

## 2015-01-22 ENCOUNTER — Ambulatory Visit
Admission: RE | Admit: 2015-01-22 | Discharge: 2015-01-22 | Disposition: A | Discharge status: Home / Self Care | Payer: Medicare HMO | Source: Ambulatory Visit | Attending: Radiation Oncology | Admitting: Radiation Oncology

## 2015-01-22 DIAGNOSIS — Z510 Encounter for antineoplastic radiation therapy: Secondary | ICD-10-CM | POA: Diagnosis not present

## 2015-01-23 ENCOUNTER — Ambulatory Visit
Admission: RE | Admit: 2015-01-23 | Discharge: 2015-01-23 | Disposition: A | Discharge status: Home / Self Care | Payer: Medicare HMO | Source: Ambulatory Visit | Attending: Radiation Oncology | Admitting: Radiation Oncology

## 2015-01-23 DIAGNOSIS — Z510 Encounter for antineoplastic radiation therapy: Secondary | ICD-10-CM | POA: Diagnosis not present

## 2015-01-24 ENCOUNTER — Ambulatory Visit
Admission: RE | Admit: 2015-01-24 | Discharge: 2015-01-24 | Disposition: A | Discharge status: Home / Self Care | Payer: Medicare HMO | Source: Ambulatory Visit | Attending: Radiation Oncology | Admitting: Radiation Oncology

## 2015-01-24 DIAGNOSIS — Z510 Encounter for antineoplastic radiation therapy: Secondary | ICD-10-CM | POA: Diagnosis not present

## 2015-01-25 ENCOUNTER — Ambulatory Visit
Admission: RE | Admit: 2015-01-25 | Discharge: 2015-01-25 | Disposition: A | Discharge status: Home / Self Care | Payer: Medicare HMO | Source: Ambulatory Visit | Attending: Radiation Oncology | Admitting: Radiation Oncology

## 2015-01-25 DIAGNOSIS — Z510 Encounter for antineoplastic radiation therapy: Secondary | ICD-10-CM | POA: Diagnosis not present

## 2015-01-28 ENCOUNTER — Ambulatory Visit
Admission: RE | Admit: 2015-01-28 | Discharge: 2015-01-28 | Disposition: A | Discharge status: Home / Self Care | Payer: Medicare HMO | Source: Ambulatory Visit | Attending: Radiation Oncology | Admitting: Radiation Oncology

## 2015-01-28 ENCOUNTER — Ambulatory Visit: Payer: Medicare HMO

## 2015-01-28 ENCOUNTER — Encounter: Payer: Self-pay | Admitting: Radiation Oncology

## 2015-01-28 VITALS — BP 128/75 | HR 90 | Temp 98.5°F | Ht 69.0 in | Wt 192.5 lb

## 2015-01-28 DIAGNOSIS — C61 Malignant neoplasm of prostate: Secondary | ICD-10-CM | POA: Diagnosis present

## 2015-01-28 NOTE — Progress Notes (Signed)
Carlos Wise reports good urinary stream with occ. burning at the end of his stream, nocturia 2-3, no rectal irritation nor diarrhea.

## 2015-01-28 NOTE — Progress Notes (Signed)
Weekly Management Note:  Site: Prostate Current Dose:  6435  cGy Projected Dose: 7800  cGy  Narrative: The patient is seen today for routine under treatment assessment. CBCT/MVCT images/port films were reviewed. The chart was reviewed.   Bladder filling is satisfactory.  He does have slight burning at the end of his urinary stream.  Nocturia 2-3.  Physical Examination:  Filed Vitals:   01/28/15 1018  BP: 128/75  Pulse: 90  Temp: 98.5 F (36.9 C)  .  Weight: 192 lb 8 oz (87.317 kg).  No change.  Impression: Tolerating radiation therapy well.  He will finish his radiation therapy next week.  Plan: Continue radiation therapy as planned.

## 2015-01-29 ENCOUNTER — Ambulatory Visit: Payer: Medicare HMO

## 2015-01-29 ENCOUNTER — Ambulatory Visit
Admission: RE | Admit: 2015-01-29 | Discharge: 2015-01-29 | Disposition: A | Discharge status: Home / Self Care | Payer: Medicare HMO | Source: Ambulatory Visit | Attending: Radiation Oncology | Admitting: Radiation Oncology

## 2015-01-29 DIAGNOSIS — C61 Malignant neoplasm of prostate: Secondary | ICD-10-CM | POA: Diagnosis not present

## 2015-01-30 ENCOUNTER — Ambulatory Visit
Admission: RE | Admit: 2015-01-30 | Discharge: 2015-01-30 | Disposition: A | Discharge status: Home / Self Care | Payer: Medicare HMO | Source: Ambulatory Visit | Attending: Radiation Oncology | Admitting: Radiation Oncology

## 2015-01-30 ENCOUNTER — Ambulatory Visit: Payer: Medicare HMO

## 2015-01-30 DIAGNOSIS — C61 Malignant neoplasm of prostate: Secondary | ICD-10-CM | POA: Diagnosis not present

## 2015-01-31 ENCOUNTER — Ambulatory Visit
Admission: RE | Admit: 2015-01-31 | Discharge: 2015-01-31 | Disposition: A | Discharge status: Home / Self Care | Payer: Medicare HMO | Source: Ambulatory Visit | Attending: Radiation Oncology | Admitting: Radiation Oncology

## 2015-01-31 ENCOUNTER — Ambulatory Visit: Payer: Medicare HMO

## 2015-01-31 DIAGNOSIS — C61 Malignant neoplasm of prostate: Secondary | ICD-10-CM | POA: Diagnosis not present

## 2015-02-01 ENCOUNTER — Ambulatory Visit: Payer: Medicare HMO

## 2015-02-01 ENCOUNTER — Ambulatory Visit
Admission: RE | Admit: 2015-02-01 | Discharge: 2015-02-01 | Disposition: A | Discharge status: Home / Self Care | Payer: Medicare HMO | Source: Ambulatory Visit | Attending: Radiation Oncology | Admitting: Radiation Oncology

## 2015-02-01 DIAGNOSIS — C61 Malignant neoplasm of prostate: Secondary | ICD-10-CM | POA: Diagnosis not present

## 2015-02-04 ENCOUNTER — Ambulatory Visit
Admission: RE | Admit: 2015-02-04 | Discharge: 2015-02-04 | Disposition: A | Discharge status: Home / Self Care | Payer: Medicare HMO | Source: Ambulatory Visit | Attending: Radiation Oncology | Admitting: Radiation Oncology

## 2015-02-04 VITALS — BP 140/79 | HR 91 | Temp 97.6°F | Ht 69.0 in | Wt 193.8 lb

## 2015-02-04 DIAGNOSIS — C61 Malignant neoplasm of prostate: Secondary | ICD-10-CM

## 2015-02-04 NOTE — Progress Notes (Signed)
Weekly Management Note:  Site: Prostate Current Dose:  7410  cGy Projected Dose: 7800  cGy  Narrative: The patient is seen today for routine under treatment assessment. CBCT/MVCT images/port films were reviewed. The chart was reviewed.   Bladder filling is satisfactory.  Physical Examination:  Filed Vitals:   02/04/15 1016  BP: 140/79  Pulse: 91  Temp: 97.6 F (36.4 C)  .  Weight: 193 lb 12.8 oz (87.907 kg).  No new GU or GI difficulty.  Impression: Tolerating radiation therapy well.  He finishes his radiation therapy this Wednesday.  Plan: Continue radiation therapy as planned.  One-month follow-up visit after completion of radiation therapy.

## 2015-02-04 NOTE — Progress Notes (Signed)
Carlos Wise reports good urinary stream with occ. burning at the end of his stream, nocturia 2-3.  He reports more frequent, "normal" stools at this time.  Completes on Wednesday of this week.

## 2015-02-05 ENCOUNTER — Ambulatory Visit
Admission: RE | Admit: 2015-02-05 | Discharge: 2015-02-05 | Disposition: A | Discharge status: Home / Self Care | Payer: Medicare HMO | Source: Ambulatory Visit | Attending: Radiation Oncology | Admitting: Radiation Oncology

## 2015-02-05 DIAGNOSIS — C61 Malignant neoplasm of prostate: Secondary | ICD-10-CM | POA: Diagnosis not present

## 2015-02-06 ENCOUNTER — Ambulatory Visit
Admission: RE | Admit: 2015-02-06 | Discharge: 2015-02-06 | Disposition: A | Discharge status: Home / Self Care | Payer: Medicare HMO | Source: Ambulatory Visit | Attending: Radiation Oncology | Admitting: Radiation Oncology

## 2015-02-06 ENCOUNTER — Encounter: Payer: Self-pay | Admitting: Radiation Oncology

## 2015-02-06 DIAGNOSIS — C61 Malignant neoplasm of prostate: Secondary | ICD-10-CM | POA: Diagnosis not present

## 2015-02-06 NOTE — Progress Notes (Signed)
[facility] End of Treatment Note  Name:Carlos Wise  Date: 02/06/2015 BMW:[id number] DOB:10-Apr-1941   Status:outpatient    CC: No primary care provider on file.  Dr. Katrine Coho  REFERRING PHYSICIAN: Dr. 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 tolerated his treatment well although he developed mild obstructive symptoms early during his course of therapy which improved with tamsulosin.  Otherwise no GU or GI difficulties.                         PLAN: Routine followup in one month. Patient instructed to call if questions or worsening complaints in interim.

## 2015-02-06 NOTE — Progress Notes (Signed)
Chart note: Mr. Fosnaugh began his VMAT IMRT on 12/12/2014.  He was treated with VMAT IMRT with dual ARC with 2 dynamic sets of MLCs corresponding to one set of IMRT treatment devices [phone])

## 2015-03-05 ENCOUNTER — Encounter: Payer: Self-pay | Admitting: Radiation Oncology

## 2015-03-06 ENCOUNTER — Encounter: Payer: Self-pay | Admitting: Radiation Oncology

## 2015-03-06 ENCOUNTER — Ambulatory Visit
Admission: RE | Admit: 2015-03-06 | Discharge: 2015-03-06 | Disposition: A | Discharge status: Home / Self Care | Payer: Medicare HMO | Source: Ambulatory Visit | Attending: Radiation Oncology | Admitting: Radiation Oncology

## 2015-03-06 VITALS — BP 139/83 | HR 94 | Temp 97.8°F | Ht 69.0 in | Wt 189.8 lb

## 2015-03-06 DIAGNOSIS — C61 Malignant neoplasm of prostate: Secondary | ICD-10-CM

## 2015-03-06 HISTORY — DX: Personal history of irradiation: Z92.3

## 2015-03-06 NOTE — Progress Notes (Signed)
CC: Dr. Katrine Wise  Follow-up note:  [name] returns today approximately 1 month following completion of radiation therapy/IMRT along with androgen deprivation therapy in the management of his stage TIc high-risk adenocarcinoma prostate.  He is doing well and he feels that his urinary habits have almost returned to baseline.  He has nocturia 1-2.  He remains on tamsulosin.  He tells me that he will run out of his prescription within a week and he may try to do without it.  No GI difficulties.  He does not have any significant hot flashes although he does have some fatigue which she feels is more likely related to helping his wife by pushing her in a wheelchair after she recently fractured her hip for which she underwent surgery.  His Viadur implant was on April 12 and I suspect that Dr. Gaynelle Wise will replace this in early to mid April.  He does not yet have a FU appointment to see Dr. Gaynelle Wise.  Physical examination: Alert and oriented. Filed Vitals:   03/06/15 1021  BP: 139/83  Pulse: 94  Temp: 97.8 F (36.6 C)   Rectal examination not performed today.  Impression: Satisfactory progress.  He is having little in the way of side effects from his androgen deprivation therapy.  I told [name] that Dr. Gaynelle Wise may want to see him later this year for routine follow-up visit, or simply wait until next year prior to scheduling replacement of his Viadur implant.  I've not scheduled him for a formal follow-up visit with me, , and we would appreciate Dr. Gaynelle Wise keep [place] posted on his progress.  Plan: As above.

## 2015-03-06 NOTE — Progress Notes (Signed)
Carlos Wise here for reassessment s/p xrt to his pelvis for prostate cancer which completed on 02/06/15.  He reports intermittent burning upon urination and grades this as a level 5/10.  He continues to have urinary frequency during the night and urgency.  Nocturia x 2.  Denies any proctitis.

## 2015-04-24 DIAGNOSIS — E7800 Pure hypercholesterolemia, unspecified: Secondary | ICD-10-CM | POA: Diagnosis not present

## 2015-04-24 DIAGNOSIS — R69 Illness, unspecified: Secondary | ICD-10-CM | POA: Diagnosis not present

## 2015-04-24 DIAGNOSIS — I10 Essential (primary) hypertension: Secondary | ICD-10-CM | POA: Diagnosis not present

## 2015-04-24 DIAGNOSIS — C61 Malignant neoplasm of prostate: Secondary | ICD-10-CM | POA: Diagnosis not present

## 2015-04-24 DIAGNOSIS — Z6829 Body mass index (BMI) 29.0-29.9, adult: Secondary | ICD-10-CM | POA: Diagnosis not present

## 2015-04-24 DIAGNOSIS — Z79899 Other long term (current) drug therapy: Secondary | ICD-10-CM | POA: Diagnosis not present

## 2015-04-24 DIAGNOSIS — Z23 Encounter for immunization: Secondary | ICD-10-CM | POA: Diagnosis not present

## 2015-07-10 DIAGNOSIS — R05 Cough: Secondary | ICD-10-CM | POA: Diagnosis not present

## 2015-07-10 DIAGNOSIS — Z6829 Body mass index (BMI) 29.0-29.9, adult: Secondary | ICD-10-CM | POA: Diagnosis not present

## 2015-08-09 DIAGNOSIS — Z6829 Body mass index (BMI) 29.0-29.9, adult: Secondary | ICD-10-CM | POA: Diagnosis not present

## 2015-08-09 DIAGNOSIS — R0609 Other forms of dyspnea: Secondary | ICD-10-CM | POA: Diagnosis not present

## 2015-08-09 DIAGNOSIS — Z1389 Encounter for screening for other disorder: Secondary | ICD-10-CM | POA: Diagnosis not present

## 2015-08-09 DIAGNOSIS — Z9181 History of falling: Secondary | ICD-10-CM | POA: Diagnosis not present

## 2015-08-09 DIAGNOSIS — J309 Allergic rhinitis, unspecified: Secondary | ICD-10-CM | POA: Diagnosis not present

## 2015-08-09 DIAGNOSIS — R5383 Other fatigue: Secondary | ICD-10-CM | POA: Diagnosis not present

## 2015-08-09 DIAGNOSIS — R202 Paresthesia of skin: Secondary | ICD-10-CM | POA: Diagnosis not present

## 2015-08-15 DIAGNOSIS — W010XXA Fall on same level from slipping, tripping and stumbling without subsequent striking against object, initial encounter: Secondary | ICD-10-CM | POA: Diagnosis not present

## 2015-08-15 DIAGNOSIS — R0602 Shortness of breath: Secondary | ICD-10-CM | POA: Diagnosis not present

## 2015-08-15 DIAGNOSIS — S20212A Contusion of left front wall of thorax, initial encounter: Secondary | ICD-10-CM | POA: Diagnosis not present

## 2015-08-15 DIAGNOSIS — Z8546 Personal history of malignant neoplasm of prostate: Secondary | ICD-10-CM | POA: Diagnosis not present

## 2015-08-15 DIAGNOSIS — Z87891 Personal history of nicotine dependence: Secondary | ICD-10-CM | POA: Diagnosis not present

## 2015-08-15 DIAGNOSIS — R05 Cough: Secondary | ICD-10-CM | POA: Diagnosis not present

## 2015-08-15 DIAGNOSIS — S40012A Contusion of left shoulder, initial encounter: Secondary | ICD-10-CM | POA: Diagnosis not present

## 2015-08-15 DIAGNOSIS — Z7982 Long term (current) use of aspirin: Secondary | ICD-10-CM | POA: Diagnosis not present

## 2015-08-20 DIAGNOSIS — S20212A Contusion of left front wall of thorax, initial encounter: Secondary | ICD-10-CM | POA: Diagnosis not present

## 2015-08-20 DIAGNOSIS — Z6829 Body mass index (BMI) 29.0-29.9, adult: Secondary | ICD-10-CM | POA: Diagnosis not present

## 2015-08-20 DIAGNOSIS — R05 Cough: Secondary | ICD-10-CM | POA: Diagnosis not present

## 2015-09-05 DIAGNOSIS — R0602 Shortness of breath: Secondary | ICD-10-CM | POA: Diagnosis not present

## 2015-09-05 DIAGNOSIS — R05 Cough: Secondary | ICD-10-CM | POA: Diagnosis not present

## 2015-09-05 DIAGNOSIS — J310 Chronic rhinitis: Secondary | ICD-10-CM | POA: Diagnosis not present

## 2015-09-05 DIAGNOSIS — Z8719 Personal history of other diseases of the digestive system: Secondary | ICD-10-CM | POA: Diagnosis not present

## 2015-10-15 DIAGNOSIS — R05 Cough: Secondary | ICD-10-CM | POA: Diagnosis not present

## 2015-10-21 DIAGNOSIS — Z6828 Body mass index (BMI) 28.0-28.9, adult: Secondary | ICD-10-CM | POA: Diagnosis not present

## 2015-10-21 DIAGNOSIS — H8110 Benign paroxysmal vertigo, unspecified ear: Secondary | ICD-10-CM | POA: Diagnosis not present

## 2015-11-08 ENCOUNTER — Ambulatory Visit: Payer: Medicare HMO | Attending: Family Medicine | Admitting: Physical Therapy

## 2015-11-08 ENCOUNTER — Encounter: Payer: Self-pay | Admitting: Physical Therapy

## 2015-11-08 VITALS — BP 131/62

## 2015-11-08 DIAGNOSIS — R42 Dizziness and giddiness: Secondary | ICD-10-CM | POA: Diagnosis not present

## 2015-11-08 NOTE — Therapy (Signed)
[place] MAIN [facility] SERVICES [address] [place], [place], [address] Phone: [phone]   Fax:  [phone]  Physical Therapy Evaluation  Patient Details  Name: SOPHEAK KISSLING MRN: [id number] Date of Birth: [birth year] Referring Provider: Dr. Lisbeth Ply  Encounter Date: 11/08/2015      PT End of Session - 11/08/15 1252    Visit Number 1   Number of Visits 8   Date for PT Re-Evaluation 01/03/16   Authorization Type Needs G codes   PT Start Time [id number]   PT Stop Time 1405   PT Time Calculation (min) 70 min   Activity Tolerance Patient tolerated treatment well   Behavior During Therapy [facility] for tasks assessed/performed      Past Medical History  Diagnosis Date  . Prostate cancer ([place])   . Hyperlipemia   . Herpes zoster   . Hypertension   . Intestinal polyp     hyperplastic  . S/P radiation therapy 12/12/2014 through 02/06/2015     Prostate 7800 cGy in 40 sessions, seminal vesicles 5600 cGy in 40 sessions     Past Surgical History  Procedure Laterality Date  . Carotid artery catherization    . Prostate biopsy  06/27/2014    Filed Vitals:   11/08/15 1332 11/08/15 1333 11/08/15 1334  BP: 142/72 132/77 131/62    VESTIBULAR AND BALANCE EVALUATION  Onset Date: May 1st 2017  HISTORY:  Subjective history of current problem: Pt reports that when he layed down one night he experienced vertigo that was brief. Pt states this began the first of May and states no prior issues with vertigo. Pt reports that getting up in the morning he has to go slow. Pt states it is annoying more than anything else. Patient states sometimes when he looks up he gets vertigo but states it does not happen all the time. Pt states he has allergies and sometimes if he has a coughing spell he will experience dizziness.   Description of dizziness: vertigo, unsteadiness with initial standing from  lower level     Frequency: usually in the morning every morning Duration: seconds Symptom nature: motion provoked,positional, variable Provocative Factors: looking up, lying down and standing from low positions at times Easing Factors: none that he can recall   Progression of symptoms: no change since onset History of similar episodes: denies  Falls (yes/no): yes Number of falls in past 6 months: 1 fall when he got out of bed quickly a few weeks ago due to dizziness; pt states this was around the time when his dizziness symptoms had first begun.  Prior Functional Level: independent community ambulator without AD, driving and independent with ADLs.   Auditory complaints (tinnitus, pain, drainage): denies except occasional tinnitus chronically Vision (last eye exam, diplopia, recent changes): denies   EXAMINATION       COORDINATION: Finger to Nose:  Dysmetric  Left; normal right Past Pointing:   Left  Past pointing; normal right     MUSCULOSKELETAL SCREEN: Cervical Spine ROM: WFL AROM cervical R and L rotation, flexion and extension without pain. AROM cervical extension was limited although WFL.   Functional Mobility: independent with transfers and bed mobility  Gait: patient ambulates with good cadence with reciprocal arm swing without AD. Pt reports mild apprehension when asked to look up while ambulating but was able to do so with mild changes to gait.  Scanning of visual environment with gait is: fair  Balance: Pt demonstrates very mild gait changes with  ambulation with vertical head turns. Pt scored a 23/24 on the DGI which is normal.    OCULOMOTOR / VESTIBULAR TESTING:  Oculomotor Exam- Room Light  Normal Abnormal Comments  Ocular Alignment N    Ocular ROM N    Spontaneous Nystagmus N    End-Gaze Nystagmus N  AGE APPROPRIATE AT END RANGE  Smooth Pursuit N    Saccades N    VOR N    VOR Cancellation N    Left Head Thrust   Deferred  Right Head Thrust   Deferred   Head Shaking Nystagmus   Deferred       BPPV TESTS:  Symptoms Duration Intensity Nystagmus  L Dix-Hallpike None reported with testing on flat mat table     R Dix-Hallpike None reported with testing on flat mat table; repeated on inverted mat table and pt reported 5/10 vertigo Less than 30 seconds 5/10 Upbeating, right torsional nystagmus of short duration  L Head Roll negative   None observed  R Head Roll negative   None observed    FUNCTIONAL OUTCOME MEASURES:  Results Comments  DGI 23/24 normal         Subjective Assessment - 11/08/15 1421    Subjective Patient reports at the end of the session "I can tell I feel better already".   Pertinent History Pt reports that when he layed down one night he experienced vertigo that was brief. Pt states this began the first of May and states no prior issues with vertigo. Pt reports that getting up in the morning he has to go slow. Pt states it is annoying more than anything else. Patient states sometimes when he looks up he gets vertigo but states it does not happen all the time. Pt states he has allergies and sometimes if he has a coughing spell he will experience dizziness.     Currently in Pain? No/denies            [facility] PT Assessment - 11/08/15 0001    Assessment   Medical Diagnosis positional vertigo   Referring Provider Dr. Lisbeth Ply   Onset Date/Surgical Date 10/14/15   Hand Dominance Right   Next MD Visit 12/30/2015   Prior Therapy none   Precautions   Precautions None   Restrictions   Weight Bearing Restrictions No   Balance Screen   Has the patient fallen in the past 6 months Yes   How many times? 1   Has the patient had a decrease in activity level because of a fear of falling?  Yes   Is the patient reluctant to leave their home because of a fear of falling?  No   Home Environment   Living Environment Private residence   Living Arrangements Spouse/significant other   Available Help at Discharge Family;Friend(s)    Type of [place] to enter   Entrance Stairs-Number of Steps 8   Entrance Stairs-Rails Left;Right;Can reach both   [place] Two level   Alternate Level Stairs-Number of Steps patient wrote 2 with bilateral rails   Alternate Level Stairs-Rails Right;Left   Home Equipment Grab bars - tub/shower;Toilet riser   Standardized Balance Assessment   Standardized Balance Assessment Dynamic Gait Index   Dynamic Gait Index   Level Surface Normal   Change in Gait Speed Normal   Gait with Horizontal Head Turns Normal   Gait with Vertical Head Turns Mild Impairment   Gait and Pivot Turn Normal   Step Over Obstacle  Normal   Step Around Obstacles Normal   Steps Normal   Total Score 23        Canalith Repositioning: Performed Left and Right Dix-Hallpike test and both were negative with no nystagmus observed and patient denied vertigo with patient. Patient however with limited cervical extension ROM and therefore repeated testing on inclined mat table.    On inverted mat table performed Right Dix-Hallpike testing. Performed right Dix-Hallpike test and it was positive with right upbeating torsional nystagmus of short duration. Performed canalith repositioning maneuver (CRT) Repeated R CRT for a total of 2 maneuvers with retesting between maneuvers Pt reported 5/10 vertigo with first Dix-Hallpike test on inverted mat table and reported <5/10 on second test.        PT Education - 11/08/15 1251    Education provided Yes   Education Details BPPV and handout information from [email or web address] provided, POC   Person(s) Educated Patient   Methods Explanation;Demonstration;Handout   Comprehension Verbalized understanding             PT Long Term Goals - 11/08/15 1252    PT LONG TERM GOAL #1   Title Patient reports no vertigo with provoking motions or positions.   Time 8   Period Weeks   Status New   PT LONG TERM GOAL #2   Title Patient will report 50% or greater  improvement in his symptoms of dizziness and imbalance with provoking motions or positions by 01/03/2016.   Baseline Pt reports 5/10 dizziness with activity this date   Time 8   Period Weeks   Status New               Plan - 11/08/15 1252    Clinical Impression Statement Patient with 10 mmHg decrease in systolic blood pressure with change in postion from sup to sitting with reports of mild dizziness. Patient reporting new onset of vertigo lasting seconds that is brought on by position changes. Pt with positive right Dix-Hallpike testing on inverted mat table secondary to decreased cerivcal ROM extension. Pt responded well to canalith repositioning maneuvers and no nystagmus was observed upon repeat testing. Patient with testing and signs that could be consistent with right posterior canal BPPV and would benefit from continued PT services to perform repeat cnaalith repositioning maneuvers if indicated and to address patient's subjective symptoms of dizziness and imbalance.    Rehab Potential Excellent   Clinical Impairments Affecting Rehab Potential Positive indicators: motivated, acute onset  Negative Indicators: decreased cervical AROM extension   PT Frequency 1x / week   PT Duration 8 weeks   PT Treatment/Interventions Canalith Repostioning;Therapeutic activities;Vestibular;Balance training;Neuromuscular re-education;Patient/family education   PT Next Visit Plan Re-test Dix-Hallpike tests on inclined mat table and repeat CRT if indicated.   PT Home Exercise Plan N/A at present   Consulted and Agree with Plan of Care Patient      Patient will benefit from skilled therapeutic intervention in order to improve the following deficits and impairments:  Dizziness, Decreased balance  Visit Diagnosis: Dizziness and giddiness - Plan: PT plan of care cert/re-cert      G-Codes - 0000000 [id number]    Functional Assessment Tool Used clinical judgment, DGI   Functional Limitation Mobility: Walking  and moving around   Mobility: Walking and Moving Around Current Status [id number]) At least 1 percent but less than 20 percent impaired, limited or restricted   Mobility: Walking and Moving Around Goal Status [id number]) 0 percent impaired, limited or restricted  Problem List Patient Active Problem List   Diagnosis Date Noted  . Malignant neoplasm of prostate ([place]) 07/24/2014   Lady Deutscher PT, DPT Lady Deutscher 11/08/2015, 2:59 PM  [place] MAIN [facility] SERVICES [address] [place], [place], [address] Phone: [phone]   Fax:  [phone]  Name: EUELL SANTORELLI MRN: [id number] Date of Birth: 05-Sep-1940

## 2015-11-15 ENCOUNTER — Encounter: Payer: Medicare HMO | Admitting: Physical Therapy

## 2015-11-20 ENCOUNTER — Encounter: Payer: Medicare HMO | Admitting: Physical Therapy

## 2015-11-22 DIAGNOSIS — C61 Malignant neoplasm of prostate: Secondary | ICD-10-CM | POA: Diagnosis not present

## 2015-11-26 DIAGNOSIS — R5383 Other fatigue: Secondary | ICD-10-CM | POA: Diagnosis not present

## 2015-11-26 DIAGNOSIS — C61 Malignant neoplasm of prostate: Secondary | ICD-10-CM | POA: Diagnosis not present

## 2015-11-29 ENCOUNTER — Ambulatory Visit: Payer: Medicare HMO | Attending: Family Medicine | Admitting: Physical Therapy

## 2015-12-06 ENCOUNTER — Ambulatory Visit: Payer: Medicare HMO | Admitting: Physical Therapy

## 2015-12-13 ENCOUNTER — Encounter: Payer: Medicare HMO | Admitting: Physical Therapy

## 2016-01-20 DIAGNOSIS — I959 Hypotension, unspecified: Secondary | ICD-10-CM | POA: Diagnosis not present

## 2016-01-20 DIAGNOSIS — Z6829 Body mass index (BMI) 29.0-29.9, adult: Secondary | ICD-10-CM | POA: Diagnosis not present

## 2016-01-20 DIAGNOSIS — R5383 Other fatigue: Secondary | ICD-10-CM | POA: Diagnosis not present

## 2016-01-20 DIAGNOSIS — R69 Illness, unspecified: Secondary | ICD-10-CM | POA: Diagnosis not present

## 2016-02-11 DIAGNOSIS — H2513 Age-related nuclear cataract, bilateral: Secondary | ICD-10-CM | POA: Diagnosis not present

## 2016-02-12 DIAGNOSIS — M79659 Pain in unspecified thigh: Secondary | ICD-10-CM | POA: Diagnosis not present

## 2016-02-12 DIAGNOSIS — E663 Overweight: Secondary | ICD-10-CM | POA: Diagnosis not present

## 2016-02-12 DIAGNOSIS — Z6829 Body mass index (BMI) 29.0-29.9, adult: Secondary | ICD-10-CM | POA: Diagnosis not present

## 2016-02-12 DIAGNOSIS — R202 Paresthesia of skin: Secondary | ICD-10-CM | POA: Diagnosis not present

## 2016-02-24 ENCOUNTER — Ambulatory Visit: Payer: Self-pay | Admitting: Allergy and Immunology

## 2016-03-04 ENCOUNTER — Encounter: Payer: Self-pay | Admitting: Allergy and Immunology

## 2016-03-04 ENCOUNTER — Ambulatory Visit (INDEPENDENT_AMBULATORY_CARE_PROVIDER_SITE_OTHER): Payer: Medicare HMO | Admitting: Allergy and Immunology

## 2016-03-04 VITALS — BP 150/82 | HR 104 | Temp 99.1°F | Resp 20 | Ht 67.13 in | Wt 196.8 lb

## 2016-03-04 DIAGNOSIS — R5382 Chronic fatigue, unspecified: Secondary | ICD-10-CM

## 2016-03-04 NOTE — Progress Notes (Signed)
NEW PATIENT NOTE  Referring Provider: No ref. provider found Primary Provider: [name], MD Date of office visit: 03/04/2016    Subjective:   Chief Complaint:  Carlos Wise (DOB: 04/26/1941) is a 75 y.o. male who presents to the clinic on 03/04/2016 with a chief complaint of Fatigue (suspects food allergy) and sinus congestion .  HPI: Carlos Wise presents to this clinic in evaluation of fatigue. For the past 6 months he develops these episodes of very severe fatigue where he has no energy. If he performes some type of labor for an hour or 2 he then feels "spent" for the rest of the day. He's also noticed that he's developed some upper thigh pain a little bit inferior to his inguinal area bilaterally during this timeframe. Otherwise, he has no associated systemic or constitutional symptoms. He apparently sleeps quite well without any apneic issues and he feels refreshed in the morning upon awakening. He does snore but once again it does not appear as though he obstructs his airway. He is not using any type of new medications that may be responsible for this issue. He has been treating reflux for the past 6 months to address reflux-induced cough. He's had a chest x-ray and he's had blood tests apparently all of which were normal.  It should be noted that Maxwell was diagnosed with prostate cancer last year and in May receive radiation therapy and a hormone inhibitor implant in his right arm. Apparently this implant should work for one year to inhibit, I suspect, testosterone production.  Afraz would like to determine whether or not allergic disease is responsible for his episodes of fatigue. I did have a talk with him today and notified him that this would be a very unusual manifestation of allergic disease but he would like to go ahead and have that evaluation completed.  Past Medical History:  Diagnosis Date  . Herpes zoster   . Hyperlipemia   . Hypertension   . Intestinal polyp    hyperplastic  . Prostate cancer ([place])   . S/P radiation therapy 12/12/2014 through 02/06/2015    Prostate 7800 cGy in 40 sessions, seminal vesicles 5600 cGy in 40 sessions     Past Surgical History:  Procedure Laterality Date  . Carotid Artery Catherization    . PROSTATE BIOPSY  06/27/2014      Medication List      amLODipine 5 MG tablet Commonly known as:  NORVASC Take 5 mg by mouth daily.   aspirin 81 MG tablet Take 81 mg by mouth daily.   CENTRUM ADULTS PO Take 1 tablet by mouth daily.   cholecalciferol 400 units Tabs tablet Commonly known as:  VITAMIN D Take 400 Units by mouth. Vitamin D3   pantoprazole 40 MG tablet Commonly known as:  PROTONIX Take 40 mg by mouth daily.   simvastatin 40 MG tablet Commonly known as:  ZOCOR Take 40 mg by mouth daily.   tamsulosin 0.4 MG Caps capsule Commonly known as:  FLOMAX Take 1 capsule (0.4 mg total) by mouth daily.   VITAMIN B-12 ER PO Take by mouth.       Allergies  Allergen Reactions  . Penicillins     Patient states no allergy but has preference that he does not want this medication    Review of systems negative except as noted in HPI / PMHx or noted below:  Review of Systems  Constitutional: Negative.   HENT: Negative.   Eyes: Negative.  Respiratory: Negative.   Cardiovascular: Negative.   Gastrointestinal: Negative.   Genitourinary: Negative.   Musculoskeletal: Negative.   Skin: Negative.   Neurological: Negative.   Endo/Heme/Allergies: Negative.   Psychiatric/Behavioral: Negative.     Family History  Problem Relation Age of Onset  . Colon cancer Mother   . Prostate cancer Father   . Heart attack Father     Social History   Social History  . Marital status: Married    Spouse name: N/A  . Number of children: 0  . Years of education: N/A   Occupational History  . Retired    Social History Main Topics  . Smoking  status: Former Research scientist (life sciences)  . Smokeless tobacco: Never Used     Comment: Quit 30 years ago  . Alcohol use 0.0 oz/week     Comment: 3 oz daily  . Drug use: No  . Sexual activity: Not on file   Other Topics Concern  . Not on file   Social History Narrative  . No narrative on file    Environmental and Social history  Lives in a house with a dry environment, no animals located inside the household, carpeting in the bedroom, no plastic on the better pillow, and no smokers located inside the household.  Objective:   Vitals:   03/04/16 1345  BP: (!) 150/82  Pulse: (!) 104  Resp: 20  Temp: 99.1 F (37.3 C)   Height: 5' 7.13" (170.5 cm) Weight: 196 lb 12.8 oz (89.3 kg)  Physical Exam  Constitutional: He is well-developed, well-nourished, and in no distress.  HENT:  Head: Normocephalic. Head is without right periorbital erythema and without left periorbital erythema.  Right Ear: Tympanic membrane, external ear and ear canal normal.  Left Ear: Tympanic membrane, external ear and ear canal normal.  Nose: Nose normal. No mucosal edema or rhinorrhea.  Mouth/Throat: Oropharynx is clear and moist and mucous membranes are normal. No oropharyngeal exudate.  Eyes: Conjunctivae and lids are normal. Pupils are equal, round, and reactive to light.  Neck: Trachea normal. No tracheal deviation present. No thyromegaly present.  Cardiovascular: Normal rate, regular rhythm, S1 normal, S2 normal and normal heart sounds.   No murmur heard. Pulmonary/Chest: Effort normal. No stridor. No tachypnea. No respiratory distress. He has no wheezes. He has no rales. He exhibits no tenderness.  Abdominal: Soft. He exhibits no distension and no mass. There is no hepatosplenomegaly. There is no tenderness. There is no rebound and no guarding.  Musculoskeletal: He exhibits no edema or tenderness.  Lymphadenopathy:       Head (right side): No tonsillar adenopathy present.       Head (left side): No tonsillar  adenopathy present.    He has no cervical adenopathy.    He has no axillary adenopathy.  Neurological: He is alert. Gait normal.  Skin: No rash noted. He is not diaphoretic. No erythema. No pallor. Nails show no clubbing.  Psychiatric: Mood and affect normal.    Diagnostics: Allergy skin tests were performed. He did not demonstrate any hypersensitivity against a screening panel of aeroallergens or foods  Oxygen saturation was 95% on room air at rest   Assessment and Plan:    1. Chronic fatigue     1. Review all blood tests results  2. May need further blood testing  3. Obtain fall flu vaccine  I do not know the etiologic factor giving rise to Robert's fatigue but I would not be surprised at all if he has very  little testosterone circulating around in his body and that is the cause for his fatigue. Apparently his implant should have only worked for 12 months but there could be some prolonged anti-testosterone metabolism ongoing or he could've had some type of permanent change in his testosterone production or maybe he is deficient on cortisol production. If his testosterone level and his cortisol level has not been checked with his previous blood test we will check this just to confirm that this is probably the issue. I would recommend any additional evaluation at this point in time beyond checking his testosterone and cortisol level as I suspect it would be very low yield and if there is a specific etiologic factor contributing to his fatigue then he will probably need to declare itself better prior to being identified. There does not appear to be any evidence that a food or aeroallergens hypersensitivities responsible for this issue.  Allena Katz, MD [place]

## 2016-03-04 NOTE — Patient Instructions (Addendum)
  1. Review all blood tests results  2. May need further blood testing including evaluation of testosterone level  3. Obtain fall flu vaccine

## 2016-03-05 ENCOUNTER — Other Ambulatory Visit: Payer: Self-pay | Admitting: *Deleted

## 2016-03-05 ENCOUNTER — Telehealth: Payer: Self-pay

## 2016-03-05 DIAGNOSIS — R5382 Chronic fatigue, unspecified: Secondary | ICD-10-CM

## 2016-03-05 NOTE — Telephone Encounter (Signed)
Left message for pt. To call.

## 2016-03-05 NOTE — Telephone Encounter (Signed)
Patient clled back to office advised that Dr Neldon Mc wanted heim to get lab tests. He requested they be mailed his home which I did.

## 2016-03-05 NOTE — Telephone Encounter (Signed)
-----   Message from Jiles Prows, MD sent at 03/05/2016  7:40 AM EDT ----- Please inform patient that he needs to have additional blood tests including a.m. cortisol and free and total testosterone level.

## 2016-03-10 DIAGNOSIS — R5382 Chronic fatigue, unspecified: Secondary | ICD-10-CM | POA: Diagnosis not present

## 2016-03-11 LAB — TESTOSTERONE: Testosterone: 14 ng/dL — ABNORMAL LOW (ref 264–916)

## 2016-03-11 LAB — CORTISOL: CORTISOL: 15.5 ug/dL

## 2016-04-03 DIAGNOSIS — Z6829 Body mass index (BMI) 29.0-29.9, adult: Secondary | ICD-10-CM | POA: Diagnosis not present

## 2016-04-03 DIAGNOSIS — M48062 Spinal stenosis, lumbar region with neurogenic claudication: Secondary | ICD-10-CM | POA: Diagnosis not present

## 2016-04-03 DIAGNOSIS — M79659 Pain in unspecified thigh: Secondary | ICD-10-CM | POA: Diagnosis not present

## 2016-04-03 DIAGNOSIS — Z23 Encounter for immunization: Secondary | ICD-10-CM | POA: Diagnosis not present

## 2016-04-03 DIAGNOSIS — Z1389 Encounter for screening for other disorder: Secondary | ICD-10-CM | POA: Diagnosis not present

## 2016-04-30 DIAGNOSIS — R69 Illness, unspecified: Secondary | ICD-10-CM | POA: Diagnosis not present

## 2016-05-25 DIAGNOSIS — C61 Malignant neoplasm of prostate: Secondary | ICD-10-CM | POA: Diagnosis not present

## 2016-05-27 DIAGNOSIS — C61 Malignant neoplasm of prostate: Secondary | ICD-10-CM | POA: Diagnosis not present

## 2016-07-20 ENCOUNTER — Telehealth: Payer: Self-pay | Admitting: *Deleted

## 2016-07-20 NOTE — Telephone Encounter (Signed)
Called pt pharmacy l[facility]berty, left vm,  This patient is no longer in our care, patient will need to call his primary MD or his Urologist for refill on flomax' 12:28 PM

## 2016-07-28 DIAGNOSIS — E7800 Pure hypercholesterolemia, unspecified: Secondary | ICD-10-CM | POA: Diagnosis not present

## 2016-07-28 DIAGNOSIS — Z79899 Other long term (current) drug therapy: Secondary | ICD-10-CM | POA: Diagnosis not present

## 2016-07-28 DIAGNOSIS — Z1211 Encounter for screening for malignant neoplasm of colon: Secondary | ICD-10-CM | POA: Diagnosis not present

## 2016-07-28 DIAGNOSIS — Z0000 Encounter for general adult medical examination without abnormal findings: Secondary | ICD-10-CM | POA: Diagnosis not present

## 2016-07-28 DIAGNOSIS — M48062 Spinal stenosis, lumbar region with neurogenic claudication: Secondary | ICD-10-CM | POA: Diagnosis not present

## 2016-07-28 DIAGNOSIS — Z6829 Body mass index (BMI) 29.0-29.9, adult: Secondary | ICD-10-CM | POA: Diagnosis not present

## 2016-07-28 DIAGNOSIS — C61 Malignant neoplasm of prostate: Secondary | ICD-10-CM | POA: Diagnosis not present

## 2016-07-28 DIAGNOSIS — I10 Essential (primary) hypertension: Secondary | ICD-10-CM | POA: Diagnosis not present

## 2016-07-28 DIAGNOSIS — K219 Gastro-esophageal reflux disease without esophagitis: Secondary | ICD-10-CM | POA: Diagnosis not present

## 2016-07-28 DIAGNOSIS — R202 Paresthesia of skin: Secondary | ICD-10-CM | POA: Diagnosis not present

## 2016-08-18 ENCOUNTER — Ambulatory Visit: Payer: Medicare HMO | Attending: Physician Assistant

## 2016-08-18 DIAGNOSIS — R42 Dizziness and giddiness: Secondary | ICD-10-CM

## 2016-08-18 NOTE — Therapy (Signed)
[place] MAIN [facility] SERVICES [address] [place], [place], [address] Phone: [phone]   Fax:  [phone]  Physical Therapy Evaluation  Patient Details  Name: RAYDEL LATSKO MRN: [id number] Date of Birth: 1940-11-04 Referring Provider: Cyndi Bender PA-C  Encounter Date: 08/18/2016      PT End of Session - 08/18/16 1433    Visit Number 1   Number of Visits 8   Date for PT Re-Evaluation 10/18/16   Authorization Type g codes 1/10   PT Start Time 1115   PT Stop Time 1215   PT Time Calculation (min) 60 min   Activity Tolerance Patient tolerated treatment well   Behavior During Therapy [facility] for tasks assessed/performed      Past Medical History:  Diagnosis Date  . Herpes zoster   . Hyperlipemia   . Hypertension   . Intestinal polyp    hyperplastic  . Prostate cancer ([place])   . S/P radiation therapy 12/12/2014 through 02/06/2015    Prostate 7800 cGy in 40 sessions, seminal vesicles 5600 cGy in 40 sessions     Past Surgical History:  Procedure Laterality Date  . Carotid Artery Catherization    . PROSTATE BIOPSY  06/27/2014    There were no vitals filed for this visit.       Subjective Assessment - 08/18/16 1114    Subjective Dizziness and balance difficulty   Pertinent History Pt reports some problems controlling his balance for the last 6 months. He also describes his symptoms as "dizziness."  He reports symptoms when closing his eyes in the shower, bending forward, and turning his head quickly. He has suffered one fall in the last 12 months but endorses multiple stumbles. His symptoms are worse in the AM and improve when he is distracted. He has an upcoming appointment with neurology for shooting pain in bilateral hands and feet which occurs briefly and infrequently. He also reports some numbness in bilateral hands and feet but has difficulty describing in  greater detail. Denies history of diabetes. He has a history of prostate CA and was successfully treated with radiation as well as hormone therapy. He complains of severe fatigue and saw an allergist who recommended he have his testosterone checked. His lab tests showed extremely low testosterone and he recently had a follow-up with his urologist. His urologist recommended starting him on testosterone replacement therapy if his PSA remains low by June 2018.  Pt denies any recent increase in stress/anxiety. No recent tick bites. No syncopal or presyncopal episodes. Recent physical exam was WNL including labwork. 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 Pt reports some recent night sweats. States he has reported to his primary MD who had an explanation for his symptoms but pt does not recall. Otherwise history is negative for red flags.    Limitations Walking   Diagnostic tests None reported   Patient Stated Goals Decrease dizziness with household and leisure activities            [facility] PT Assessment - 08/18/16 0001      Assessment   Medical Diagnosis Balance difficulty   Referring Provider Cyndi Bender PA-C   Onset Date/Surgical Date 02/19/16  Approximate   Hand Dominance Right   Next MD Visit August 2018   Prior Therapy Yes     Precautions   Precautions None     Restrictions   Weight Bearing Restrictions No  Balance Screen   Has the patient fallen in the past 6 months Yes   How many times? 1   Has the patient had a decrease in activity level because of a fear of falling?  No   Is the patient reluctant to leave their home because of a fear of falling?  No     Home Environment   Living Environment Private residence   Living Arrangements Spouse/significant other   Available Help at Discharge Family;Friend(s)   Type of [place] to enter;Other (comment)  Also has chair lift   Entrance  Stairs-Number of Steps 8   Entrance Stairs-Rails Left;Right;Can reach both   Home Layout Two level   Home Equipment Grab bars - tub/shower;Toilet riser     Prior Function   Level of Independence Independent     Cognition   Overall Cognitive Status Within Functional Limits for tasks assessed     Observation/Other Assessments   Other Surveys  Other Surveys   Activities of Balance Confidence Scale (ABC Scale)  72.5%     Dynamic Gait Index   Level Surface Normal   Change in Gait Speed Normal   Gait with Horizontal Head Turns Mild Impairment   Gait with Vertical Head Turns Mild Impairment   Gait and Pivot Turn Normal  Reports dizziness   Step Over Obstacle Normal   Step Around Obstacles Normal   Steps Mild Impairment   Total Score 21       VESTIBULAR AND BALANCE EVALUATION  Onset Date:   HISTORY:  Subjective history of current problem: Pt reports some problems controlling his balance for the last 6 months. He also describes his symptoms as "dizziness."  He reports symptoms when closing his eyes in the shower, bending forward, and turning his head quickly. He has suffered one fall in the last 12 months but endorses multiple stumbles. His symptoms are worse in the AM and improve when he is distracted. He has an upcoming appointment with neurology for shooting pain in bilateral hands and feet which occurs briefly and infrequently. He also reports some numbness in bilateral hands and feet but has difficulty describing in greater detail. Denies history of diabetes. He has a history of prostate CA and was successfully treated with radiation as well as hormone therapy. He complains of severe fatigue and saw an allergist who recommended he have his testosterone checked. His lab tests showed extremely low testosterone and he recently had a follow-up with his urologist. His urologist recommended starting him on testosterone replacement therapy if his PSA remains low by June 2018.  Pt denies any  recent increase in stress/anxiety. No recent tick bites. No syncopal or presyncopal episodes. Recent physical exam was WNL including labwork. 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 Pt reports some recent night sweats. States he has reported to his primary MD who had an explanation for his symptoms but pt does not recall. Otherwise history is negative for red flags.   Description of dizziness: Denies: vertigo, lightheadedness, aural fullness. Confirms: general unsteadiness  Frequency: 4-5 times/wk  Duration: seconds  Symptom nature: (motion provoked/positional/spontaneous/constant, variable, intermittent): intermittent and motion provoked, never spontaneous  Provocative Factors: worse in the AM, leaning forward, quick head movements, heights, in the shower with eyes closed Easing Factors: Activity, high protein meals, distraction  Progression of symptoms: (better, worse, no change since onset) Unchanged History of similar episodes: None  Falls (yes/no):  Yes Number of falls in past 6 months: 1   Prior Functional Level: Fully independent.  Auditory complaints (tinnitus, pain, drainage): Reports infrequent history of tinnitus but not correlated with symptoms. Otherwise denies auditory symptoms. Vision (last eye exam, diplopia, recent changes): Denies symptoms, wears reading glasses but otherwise no corrective lenses.  Current Symptoms: Denies: vertigo, N & V, dysarthria, dysphagia, drop attacks, bowel and bladder changes, recent weight loss/gain, lightheadedness, headache, migraines; Confirms: rocking, general unsteadiness, imbalance, tilting, swaying;   EXAMINATION  POSTURE: WNL  NEUROLOGICAL SCREEN: (2+ unless otherwise noted.) N=normal  Ab=abnormal  Level Dermatome R L Myotome R L Reflex R L  C3 Anterior Neck N N Sidebend C2-3 N N Jaw CN V    C4 Top of Shoulder N N Shoulder Shrug C4 N N Hoffman's UMN N N  C5  Lateral Upper Arm N N Shoulder ABD C4-5 N N Biceps C5-6 2 2   C6 Lateral Arm/ Thumb N N Arm Flex/ Wrist Ext C5-6 N N Brachiorad. C5-6 2 2   C7 Middle Finger N N Arm Ext//Wrist Flex C6-7 N N Triceps C7 2 2  C8 4th & 5th Finger N N Flex/ Ext Carpi Ulnaris C8 N N Patella 2 2  T1 Medial Arm N N Interossei T1 N N Gastrocnemius 2 2  L2 Medial thigh/groin N N Illiopsoas (L2-3) N N     L3 Lower thigh/med.knee N N Quadriceps (L3-4) N N     L4 Medial leg/lat thigh N N Tibialis Ant (L4-5) N N     L5 Lat. leg & dorsal foot N N EHL (L5) N N     S1 post/lat foot/thigh/leg N N Gastrocnemius (S1-2) N N     S2 Post./med. thigh & leg N N Hamstrings (L4-S3) N N       SOMATOSENSORY:         Sensation           Intact      Diminished         Absent  Light touch Normal    Any N & T in extremities or weakness: Reports some intermittent bilateral hand and foot numbness      COORDINATION: Finger to Nose: Normal Pronator Drift: Very mild pronator drift LUE Finger opposition and rapid alternating movements WNL  MUSCULOSKELETAL SCREEN: Cervical Spine ROM: Mild limitation in all directions but particularly extension, painless   ROM: WFL  MMT: Grossly 4+ to 5/5 throughout and symmetrical, No foot drop. No abnormal tone  Functional Mobility: Independent with all transfers  Gait: Scanning of visual environment with gait is: Decreased spontaneous head turning noted with ambulation  POSTURAL CONTROL TESTS:   Clinical Test of Sensory Interaction for Balance    (CTSIB):  CONDITION TIME STRATEGY SWAY  Eyes open, firm surface 30  1+  Eyes closed, firm surface 30 Ankle 3+  Eyes open, foam surface 30 Ankle 1+  Eyes closed, foam surface 30 Ankle/knee 3+    OCULOMOTOR / VESTIBULAR TESTING:  Oculomotor Exam- Room Light  Normal Abnormal Comments  Ocular Alignment N    Ocular ROM N    Spontaneous Nystagmus N    End-Gaze Nystagmus N  Age appropriate  Smooth Pursuit  A Mildly saccadic especially during L gaze.  Convergence approximately 6 inches  Saccades N  One saccade correction, varies between under and overshoot  VOR  A Difficulty relaxing during testing but with intervention pt reports onset of symptoms  VOR Cancellation N    Left Head Thrust  Unable to accurately assess bilaterally due to difficulty relaxing cervical spine  Right Head Thrust     Head Shaking Nystagmus     Static Acuity     Dynamic Acuity       Oculomotor Exam- Fixation Suppressed  Normal Abnormal Comments  Ocular Alignment N    Ocular ROM N    Spontaneous Nystagmus N    End-Gaze Nystagmus     Left Head Thrust     Right Head Thrust     Head Shaking Nystagmus N      BPPV TESTS:  Symptoms Duration Intensity Nystagmus  L Dix-Hallpike No   No  R Dix-Hallpike No   No  L Head Roll No   No  R Head Roll No   No  L Sidelying Test      R Sidelying Test        FUNCTIONAL OUTCOME MEASURES:  Results Comments  DHI    ABC Scale 72.5% Above cut-off  DGI 21/24 Gait deviation with horizontal and vertical head turns  10 meter Walking Speed WNL        TREATMENT  Neuromuscular Re-education Pt issued VOR x 1 horizontal in sitting 1 min x 3, 4 times/day; Performed exercise x 60 seconds with patient who reports 3/10 dizziness. Verbal feedback and demonstration provided to patient. Pt issued written handout with education.                            PT Education - 08/18/16 1432    Education provided Yes   Education Details Plan of care, HEP   Person(s) Educated Patient   Methods Explanation;Demonstration;Verbal cues;Handout   Comprehension Verbalized understanding;Returned demonstration             PT Long Term Goals - 08/18/16 1428      PT LONG TERM GOAL #1   Title Pt will be independent with HEP in order to decrease symptoms and improve function at home and with leisure activities   Time 8   Period Weeks   Status New     PT LONG TERM GOAL #2   Title Patient will report 50% or  greater improvement in his symptoms of dizziness and imbalance with provoking motions or positions.    Time 8   Period Weeks   Status New     PT LONG TERM GOAL #3   Title Pt will improve DGI to at least 23/24 in order to demonstrate normal gait with horizontal and vertical head turns with ambulation   Baseline 08/18/16: 21/24   Time 8   Period Weeks   Status New               Plan - 08/18/16 1426    Clinical Impression Statement Pt is a pleasant 76 yo male referred for difficulty related to his balance. He reports approximately 6 month duration of symptoms which are primarily described as dizziness related to quick head turns, bending forward, and closing his eyes in the shower. PT examination reveals mildly saccadic smooth pursuits but no reproduction of symptoms. Positive VOR for reproduction of dizziness as well lateral gait deviation and slowing of gait speed with both vertical and horizontal head turns while walking. All BPPV testing negative. Significant trunk sway and difficulty with eyes closed on compliant surface during modified CTSIB test. 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 He will benefit from vestibular therapy to improve symptoms of dizziness and balance  deficits in order to decrease his impairments at home and with leisure activities. If he does not respond to vestibular therapy he may benefit from referral to ENT for further work-up.     Rehab Potential Excellent   Clinical Impairments Affecting Rehab Potential Positive: motivated; Negative: age   PT Frequency 1x / week   PT Duration 8 weeks   PT Treatment/Interventions Canalith Repostioning;Therapeutic activities;Vestibular;Balance training;Neuromuscular re-education;Patient/family education;DME Instruction;Gait training;Therapeutic exercise;Manual techniques   PT Next Visit Plan Pt to complete DHI, review and progress VOR, semi-tandem progressions, progress HEP as tolerated   PT Home  Exercise Plan VOR x 1 horizontal in sitting, 1 min x 3, 4x/day   Consulted and Agree with Plan of Care Patient      Patient will benefit from skilled therapeutic intervention in order to improve the following deficits and impairments:  Dizziness, Decreased balance  Visit Diagnosis: Dizziness and giddiness - Plan: PT plan of care cert/re-cert      G-Codes - [id number]    Functional Assessment Tool Used (Outpatient Only) clinical judgment, DGI, ABC   Functional Limitation Mobility: Walking and moving around   Mobility: Walking and Moving Around Current Status [phone]) At least 20 percent but less than 40 percent impaired, limited or restricted   Mobility: Walking and Moving Around Goal Status [phone]) At least 1 percent but less than 20 percent impaired, limited or restricted       Problem List Patient Active Problem List   Diagnosis Date Noted  . Malignant neoplasm of prostate ([place]) 07/24/2014   Phillips Grout PT, DPT   Huprich,Jason 08/18/2016, 2:45 PM  [place] MAIN [facility] SERVICES [address] [place], [place], [address] Phone: [phone]   Fax:  [phone]  Name: TERIQ RABON MRN: [id number] Date of Birth: Sep 03, 1940

## 2016-08-25 ENCOUNTER — Encounter: Payer: Medicare HMO | Admitting: Physical Therapy

## 2016-08-28 ENCOUNTER — Ambulatory Visit: Payer: Medicare HMO | Admitting: Physical Therapy

## 2016-08-31 ENCOUNTER — Ambulatory Visit: Payer: Medicare HMO

## 2016-09-02 ENCOUNTER — Ambulatory Visit: Payer: Medicare HMO

## 2016-09-07 ENCOUNTER — Ambulatory Visit: Payer: Medicare HMO

## 2016-09-09 ENCOUNTER — Ambulatory Visit: Payer: Medicare HMO

## 2016-09-11 ENCOUNTER — Encounter: Payer: Medicare HMO | Admitting: Physical Therapy

## 2016-09-14 ENCOUNTER — Ambulatory Visit: Payer: Medicare HMO

## 2016-09-16 ENCOUNTER — Ambulatory Visit: Payer: Medicare HMO

## 2016-09-18 ENCOUNTER — Ambulatory Visit: Payer: Medicare HMO | Attending: Physician Assistant

## 2016-09-18 VITALS — BP 154/78 | HR 101

## 2016-09-18 DIAGNOSIS — R42 Dizziness and giddiness: Secondary | ICD-10-CM | POA: Diagnosis not present

## 2016-09-18 NOTE — Patient Instructions (Addendum)
Feet Heel-Toe "Tandem", Head Motion - Eyes Open    With eyes open, right foot in front of the other but slightly out to the side, move head slowly: left and right for 30 seconds. Alternate feet and then repeat. Perform 3 times with each leg forward. Do __2__ sessions per day.

## 2016-09-18 NOTE — Therapy (Addendum)
[place] MAIN [facility] SERVICES [address] [place], [place], [address] Phone: [phone]   Fax:  [phone]  Physical Therapy Treatment  Patient Details  Name: Carlos Wise MRN: [id number] Date of Birth: 11/09/1940 Referring Provider: Cyndi Bender PA-C  Encounter Date: 09/18/2016      PT End of Session - 09/18/16 1013    Visit Number 2   Number of Visits 8   Date for PT Re-Evaluation Nov 02, 2016   Authorization Type g codes August 14, 2022   PT Start Time 1000   PT Stop Time 1045   PT Time Calculation (min) 45 min   Activity Tolerance Patient tolerated treatment well   Behavior During Therapy [facility] for tasks assessed/performed      Past Medical History:  Diagnosis Date  . Herpes zoster   . Hyperlipemia   . Hypertension   . Intestinal polyp    hyperplastic  . Prostate cancer ([place])   . S/P radiation therapy 12/12/2014 through 02/06/2015    Prostate 7800 cGy in 40 sessions, seminal vesicles 5600 cGy in 40 sessions     Past Surgical History:  Procedure Laterality Date  . Carotid Artery Catherization    . PROSTATE BIOPSY  06/27/2014    Vitals:   09/18/16 1006  BP: (!) 154/78  Pulse: (!) 101  SpO2: 98%        Subjective Assessment - 09/18/16 1001    Subjective Pt reports that he is doing well at this time. He states that he has been performing VOR at home and states that it has helped with his symptoms. He has had scheduling conflicts and went out of town for a couple weeks which is why he has not been back since the initial evaluation.    Pertinent History Pt reports some problems controlling his balance for the last 6 months. He also describes his symptoms as "dizziness."  He reports symptoms when closing his eyes in the shower, bending forward, and turning his head quickly. He has suffered one fall in the last 12 months but endorses multiple stumbles. His symptoms are  worse in the AM and improve when he is distracted. He has an upcoming appointment with neurology for shooting pain in bilateral hands and feet which occurs briefly and infrequently. He also reports some numbness in bilateral hands and feet but has difficulty describing in greater detail. Denies history of diabetes. He has a history of prostate CA and was successfully treated with radiation as well as hormone therapy. He complains of severe fatigue and saw an allergist who recommended he have his testosterone checked. His lab tests showed extremely low testosterone and he recently had a follow-up with his urologist. His urologist recommended starting him on testosterone replacement therapy if his PSA remains low by June 2018.  Pt denies any recent increase in stress/anxiety. No recent tick bites. No syncopal or presyncopal episodes. Recent physical exam was WNL including labwork. 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 Pt reports some recent night sweats. States he has reported to his primary MD who had an explanation for his symptoms but pt does not recall. Otherwise history is negative for red flags.    Limitations Walking   Diagnostic tests None reported   Patient Stated Goals Decrease dizziness with household and leisure activities   Currently in Pain? No/denies            [facility] PT Assessment - 09/18/16 0001  Observation/Other Assessments   Other Surveys  Other Surveys   Dizziness Handicap Inventory (DHI)  36/100       TREATMENT  NEUROMUSCULAR RE-EDUCATION  VOR Performed VOR x 1 in sitting x 60 seconds, pt requires considerable correction for speed of motion and to avoid stopping during rotation; VOR x 1 in standing with feet together x 30 seconds, too difficult for patient to maintain balance so discontinued; VOR x 1 in standing with feet apart 60 seconds x 2, pt reports 5/10 dizziness and demonstrates better  exercise technique only requiring minimal correction;  Semi-tandem Performed semitandem balance with horizontal and vertical head turns alternating forward LE, 30s x 2 for both vertical and horizontal in each position;  Pt completed DHI=36/100 (unbilled);  Hallway Ambulation Performed ambulation in hallway with horizontal and vertical head turns x 75' each; Ambulation with vertical ball toss to self x 75', horizontal toss to therapist 36' x 2; Ambulation in hallway with head turns to read playing cards on wall x 75';                   PT Education - 09/18/16 1013    Education provided Yes   Education Details HEP progression   Person(s) Educated Patient   Methods Explanation   Comprehension Verbalized understanding             PT Long Term Goals - 09/18/16 1159      PT LONG TERM GOAL #1   Title Pt will be independent with HEP in order to decrease symptoms and improve function at home and with leisure activities   Time 8   Period Weeks   Status On-going     PT LONG TERM GOAL #2   Title Patient will report 50% or greater improvement in his symptoms of dizziness and imbalance with provoking motions or positions.    Time 8   Period Weeks   Status On-going     PT LONG TERM GOAL #3   Title Pt will improve DGI to at least 23/24 in order to demonstrate normal gait with horizontal and vertical head turns with ambulation   Baseline 08/18/16: 21/24   Time 8   Period Weeks   Status On-going     PT LONG TERM GOAL #4   Title Pt will decrease DHI score by at least 18 points in order to demonstrate clinically significant reduction in disability    Baseline 09/18/16: 36/100   Time 8   Period Weeks   Status New               Plan - 09/18/16 1015    Clinical Impression Statement Pt reports some improvement in his symptoms since starting VOR exercise at home. He has not been back for therapy in the month since his initial evaluation so would not expect a lot of  progress. [place] is 36/100 on this date. Pt required significant correction to VOR x 1 exercise. Provided pt with home exercise progression on this date. Follow-up as scheduled.    Rehab Potential Excellent   Clinical Impairments Affecting Rehab Potential Positive: motivated; Negative: age   PT Frequency 1x / week   PT Duration 8 weeks   PT Treatment/Interventions Canalith Repostioning;Therapeutic activities;Vestibular;Balance training;Neuromuscular re-education;Patient/family education;DME Instruction;Gait training;Therapeutic exercise;Manual techniques   PT Next Visit Plan Progress VOR, semi-tandem progressions, ambulation with head turns, progress HEP as tolerated   PT Home Exercise Plan VOR x 1 horizontal in standing with feet apart, 1 min x 3, 4x/day,  semitandem balance with horizontal head turns 30s x 3 bilateral;   Consulted and Agree with Plan of Care Patient      Patient will benefit from skilled therapeutic intervention in order to improve the following deficits and impairments:  Dizziness, Decreased balance  Visit Diagnosis: Dizziness and giddiness     Problem List Patient Active Problem List   Diagnosis Date Noted  . Malignant neoplasm of prostate ([place]) 07/24/2014   Phillips Grout PT, DPT   Obelia Bonello 09/18/2016, 12:00 PM  [place] MAIN [facility] SERVICES [address] [place], [place], [address] Phone: [phone]   Fax:  [phone]  Name: Carlos Wise MRN: [id number] Date of Birth: 07/27/1940

## 2016-09-21 ENCOUNTER — Ambulatory Visit: Payer: Medicare HMO

## 2016-09-23 ENCOUNTER — Ambulatory Visit: Payer: Medicare HMO

## 2016-09-25 ENCOUNTER — Encounter: Payer: Self-pay | Admitting: Physical Therapy

## 2016-09-25 ENCOUNTER — Ambulatory Visit: Payer: Medicare HMO | Admitting: Physical Therapy

## 2016-09-25 DIAGNOSIS — R42 Dizziness and giddiness: Secondary | ICD-10-CM

## 2016-09-25 NOTE — Therapy (Signed)
[place] MAIN [facility] SERVICES [address] [place], [place], [address] Phone: [phone]   Fax:  [phone]  Physical Therapy Treatment  Patient Details  Name: Carlos Wise MRN: [id number] Date of Birth: November 26, 1940 Referring Provider: Cyndi Bender PA-C  Encounter Date: 09/25/2016      PT End of Session - 09/25/16 1119    Visit Number 3   Number of Visits 8   Date for PT Re-Evaluation 2016-10-29   Authorization Type g codes 2022-08-10   PT Start Time 1030/09/28   PT Stop Time 1116   PT Time Calculation (min) 44 min   Activity Tolerance Patient tolerated treatment well   Behavior During Therapy [facility] for tasks assessed/performed      Past Medical History:  Diagnosis Date  . Herpes zoster   . Hyperlipemia   . Hypertension   . Intestinal polyp    hyperplastic  . Prostate cancer ([place])   . S/P radiation therapy 12/12/2014 through 02/06/2015    Prostate 7800 cGy in 40 sessions, seminal vesicles 5600 cGy in 40 sessions     Past Surgical History:  Procedure Laterality Date  . Carotid Artery Catherization    . PROSTATE BIOPSY  06/27/2014    There were no vitals filed for this visit.      Subjective Assessment - 09/25/16 1035    Subjective Patient reports that he has been doing good. He states the exercises have helped. Patient reports yesterday was not a good day and that from 8-10 in the am is the hardest time for him. Patient denies episodes of dizziness.    Pertinent History Pt reports some problems controlling his balance for the last 6 months. He also describes his symptoms as "dizziness."  He reports symptoms when closing his eyes in the shower, bending forward, and turning his head quickly. He has suffered one fall in the last 12 months but endorses multiple stumbles. His symptoms are worse in the AM and improve when he is distracted. He has an upcoming appointment with  neurology for shooting pain in bilateral hands and feet which occurs briefly and infrequently. He also reports some numbness in bilateral hands and feet but has difficulty describing in greater detail. Denies history of diabetes. He has a history of prostate CA and was successfully treated with radiation as well as hormone therapy. He complains of severe fatigue and saw an allergist who recommended he have his testosterone checked. His lab tests showed extremely low testosterone and he recently had a follow-up with his urologist. His urologist recommended starting him on testosterone replacement therapy if his PSA remains low by June 2018.  Pt denies any recent increase in stress/anxiety. No recent tick bites. No syncopal or presyncopal episodes. Recent physical exam was WNL including labwork. 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 Pt reports some recent night sweats. States he has reported to his primary MD who had an explanation for his symptoms but pt does not recall. Otherwise history is negative for red flags.    Limitations Walking   Diagnostic tests None reported   Patient Stated Goals Decrease dizziness with household and leisure activities   Currently in Pain? No/denies      Neuromuscular Re-education:  VOR X 1 exercise:   Patient performed VOR X 1 horizontal with conflicting background in standing 3 reps of 1 minute each with verbal cues for technique initially. Patient demonstrating good amount of  head turning excursion and with maintaining eye contact with the target. Added conflicting background progression for patient's home exercise program.    Airex Balance Beam: On Airex balance beam, performed sideways stance static holds with horizontal and vertical head turns and then body turns.  On Airex balance beam, performed sideways stepping with horizontal head turns 5' times 4 reps with CGA to Min A. On Airex balance  beam, performed sideways stepping with vertical head turns 5' times 4 reps with CGA to min A. Patient with several small losses of balance that he was able to self-correct and one loss of balance in which patient required min A to correct.   Tandem Walking:  On firm surface, performed tandem walking 5' times 4 reps in // bars without UEs support with CGA. Patient improved with practice but demonstrates trunk sway and use of ankle and hip strategies to maintain balance.   Diona Foley toss over shoulder Patient performed multiple 38' trials of forward and retro ambulation while tossing ball over one shoulder with return catch over opposite shoulder with CGA. Patient reports sensation of imbalance with this activity. Patient performed multiple 41' trials of forward and retro ambulation while tossing ball over one shoulder with return catch over opposite shoulder varying the ball position to head, shoulder and waist level to promote head turning and tilting. Patient has increased difficulty with retro ambulation with increased imbalance and patient with very slow cadence with decreased step length. Patient did improve with practice with retro ambulation.   Ball circles: In standing, worked on moving a ball in circles clockwise while tracking ball with head and eyes.          PT Education - 09/25/16 1542    Education provided Yes   Education Details HEP progression and plan of care   Person(s) Educated Patient;Spouse   Methods Explanation;Demonstration   Comprehension Verbalized understanding             PT Long Term Goals - 09/18/16 1159      PT LONG TERM GOAL #1   Title Pt will be independent with HEP in order to decrease symptoms and improve function at home and with leisure activities   Time 8   Period Weeks   Status On-going     PT LONG TERM GOAL #2   Title Patient will report 50% or greater improvement in his symptoms of dizziness and imbalance with provoking motions or positions.     Time 8   Period Weeks   Status On-going     PT LONG TERM GOAL #3   Title Pt will improve DGI to at least 23/24 in order to demonstrate normal gait with horizontal and vertical head turns with ambulation   Baseline 08/18/16: 21/24   Time 8   Period Weeks   Status On-going     PT LONG TERM GOAL #4   Title Pt will decrease DHI score by at least 18 points in order to demonstrate clinically significant reduction in disability    Baseline 09/18/16: 36/100   Time 8   Period Weeks   Status New               Plan - 09/25/16 1543    Clinical Impression Statement Patient required decreased cuing wtih VOR x 1exercise and was able to progress to conflicting background. Patient challenged by narrow base of support and uneven surface activties especially when combined with vertical or horizontal head turns. Patient noted to have mild imbalance at times  during session and did require min A with sidestepping activity in Airex balance beam. Patient would benefit from continued PT services to further address deficits and goals.    Rehab Potential Excellent   Clinical Impairments Affecting Rehab Potential Positive: motivated; Negative: age   PT Frequency 1x / week   PT Duration 8 weeks   PT Treatment/Interventions Canalith Repostioning;Therapeutic activities;Vestibular;Balance training;Neuromuscular re-education;Patient/family education;DME Instruction;Gait training;Therapeutic exercise;Manual techniques   PT Next Visit Plan semi-tandem progressions, ambulation with head turns, progress HEP as tolerated; hallway ball toss, forward and retro amb with ball toss over shoulder, airex pad activities   PT Home Exercise Plan VOR x 1 horizontal in standing with feet apart, 1 min x 3, 4x/day, semitandem balance with horizontal head turns 30s x 3 bilateral;   Consulted and Agree with Plan of Care Patient      Patient will benefit from skilled therapeutic intervention in order to improve the following  deficits and impairments:  Dizziness, Decreased balance  Visit Diagnosis: Dizziness and giddiness     Problem List Patient Active Problem List   Diagnosis Date Noted  . Malignant neoplasm of prostate ([place]) 07/24/2014   Lady Deutscher PT, DPT Lady Deutscher 09/25/2016, 3:47 PM  [place] MAIN [facility] SERVICES [address] [place], [place], [address] Phone: [phone]   Fax:  [phone]  Name: JAEVION GOTO MRN: [id number] Date of Birth: 01/06/1941

## 2016-09-28 ENCOUNTER — Ambulatory Visit: Payer: Medicare HMO

## 2016-09-30 ENCOUNTER — Ambulatory Visit: Payer: Medicare HMO

## 2016-09-30 DIAGNOSIS — Z86010 Personal history of colonic polyps: Secondary | ICD-10-CM | POA: Diagnosis not present

## 2016-09-30 DIAGNOSIS — R05 Cough: Secondary | ICD-10-CM | POA: Diagnosis not present

## 2016-09-30 DIAGNOSIS — Z800 Family history of malignant neoplasm of digestive organs: Secondary | ICD-10-CM | POA: Diagnosis not present

## 2016-10-02 ENCOUNTER — Ambulatory Visit: Payer: Medicare HMO

## 2016-10-02 VITALS — BP 134/77 | HR 96

## 2016-10-02 DIAGNOSIS — R42 Dizziness and giddiness: Secondary | ICD-10-CM

## 2016-10-02 NOTE — Therapy (Signed)
[place] MAIN [facility] SERVICES [address] [place], [place], [address] Phone: [phone]   Fax:  [phone]  Physical Therapy Treatment  Patient Details  Name: Carlos Wise MRN: [id number] Date of Birth: Jan 04, 1941 Referring Provider: Cyndi Bender PA-C  Encounter Date: 10/02/2016      PT End of Session - 10/02/16 1041    Visit Number 4   Number of Visits 8   Date for PT Re-Evaluation 03-Nov-2016   Authorization Type g codes 10/14/22   PT Start Time 1028-10-02   PT Stop Time 1115   PT Time Calculation (min) 45 min   Equipment Utilized During Treatment Gait belt   Activity Tolerance Patient tolerated treatment well   Behavior During Therapy [facility] for tasks assessed/performed      Past Medical History:  Diagnosis Date  . Herpes zoster   . Hyperlipemia   . Hypertension   . Intestinal polyp    hyperplastic  . Prostate cancer ([place])   . S/P radiation therapy 12/12/2014 through 02/06/2015    Prostate 7800 cGy in 40 sessions, seminal vesicles 5600 cGy in 40 sessions     Past Surgical History:  Procedure Laterality Date  . Carotid Artery Catherization    . PROSTATE BIOPSY  06/27/2014    Vitals:   10/02/16 1033  BP: 134/77  Pulse: 96  SpO2: 98%        Subjective Assessment - 10/02/16 1032    Subjective Pt reports he is doing well today. He had a particularly good day yesterday. He reports approximately 70% improvement in symptoms since starting therapy. No specific questions or concerns at this time.    Pertinent History Pt reports some problems controlling his balance for the last 6 months. He also describes his symptoms as "dizziness."  He reports symptoms when closing his eyes in the shower, bending forward, and turning his head quickly. He has suffered one fall in the last 12 months but endorses multiple stumbles. His symptoms are worse in the AM and improve when he is  distracted. He has an upcoming appointment with neurology for shooting pain in bilateral hands and feet which occurs briefly and infrequently. He also reports some numbness in bilateral hands and feet but has difficulty describing in greater detail. Denies history of diabetes. He has a history of prostate CA and was successfully treated with radiation as well as hormone therapy. He complains of severe fatigue and saw an allergist who recommended he have his testosterone checked. His lab tests showed extremely low testosterone and he recently had a follow-up with his urologist. His urologist recommended starting him on testosterone replacement therapy if his PSA remains low by June 2018.  Pt denies any recent increase in stress/anxiety. No recent tick bites. No syncopal or presyncopal episodes. Recent physical exam was WNL including labwork. 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 Pt reports some recent night sweats. States he has reported to his primary MD who had an explanation for his symptoms but pt does not recall. Otherwise history is negative for red flags.    Limitations Walking   Diagnostic tests None reported   Patient Stated Goals Decrease dizziness with household and leisure activities   Currently in Pain? No/denies         Neuromuscular Re-education:  VOR X 1 exercise:   VOR X 1 horizontal with conflicting background in standing with feet together x 1 minute, verbal cues  for speed initially but patient demonstrating good amount of head turning excursion and with maintaining eye contact with the target. Fair amount of sway noted. Pt denies dizziness but endorses unsteadiness during this activity; VOR x 1 horizontal with conflicting background, standing on Airex with feet apart 1 minute x 2;    Ball toss over shoulder Patient performed multiple 70' trials of forward and retro ambulation while tossing ball over one  shoulder with return catch over opposite shoulder with CGA. Patient reports sensation of imbalance with this activity. Patient performed multiple 86' trials of forward and retro ambulation while tossing ball over one shoulder with return catch over opposite shoulder varying the ball position to head, shoulder and waist level to promote head turning and tilting. Patient has increased difficulty with retro ambulation with increased imbalance and patient with very slow cadence with decreased step length. Patient did improve with practice with retro ambulation.   Airex Balance Beam On Airex balance beam, performed sideways stepping with horizontal head turns 5' times 4 reps with CGA to Min A. On Airex balance beam, performed sideways stepping with vertical head turns 5' times 4 reps with CGA to min A. Patient with several small losses of balance that he was able to self-correct and one loss of balance in which patient required min A to correct. Pt has difficulty sequencing with head turns and stepping requiring frequent correction;  Tandem Walking  On Airex balance beam performed tandem walking 5' times 6 reps in // bars without UEs support with CGA. Patient with infrequent 2 finger touches on parallel bars to stabilize;  Bending Narrow stance on Airex ball passes between baskets at knee height. Pt forced to bend forward, bring ball to eye level, and bend over while turning to the other side. Considerable instability noted. Pt reports that one of the most difficulty activities for him to perform is forward bending as he feels like his body is going to continue forward  Ball circles In standing on Airex in front of mirror, worked on moving a ball in circles clockwise while tracking ball with head and eyes.                           PT Education - 10/02/16 1033    Education provided Yes   Education Details HEP reinforced   Person(s) Educated Patient   Methods Explanation    Comprehension Verbalized understanding             PT Long Term Goals - 09/18/16 1159      PT LONG TERM GOAL #1   Title Pt will be independent with HEP in order to decrease symptoms and improve function at home and with leisure activities   Time 8   Period Weeks   Status On-going     PT LONG TERM GOAL #2   Title Patient will report 50% or greater improvement in his symptoms of dizziness and imbalance with provoking motions or positions.    Time 8   Period Weeks   Status On-going     PT LONG TERM GOAL #3   Title Pt will improve DGI to at least 23/24 in order to demonstrate normal gait with horizontal and vertical head turns with ambulation   Baseline 08/18/16: 21/24   Time 8   Period Weeks   Status On-going     PT LONG TERM GOAL #4   Title Pt will decrease DHI score by at least 18  points in order to demonstrate clinically significant reduction in disability    Baseline 09/18/16: 36/100   Time 8   Period Weeks   Status New               Plan - 10/02/16 1041    Clinical Impression Statement Pt demonstrates good progress with therapy. He reports approximately 70% improvement in symptoms since starting therapy. He still presents with imbalance and has particular difficulty with retro ambulation ball passes. No HEP progression on this date. Pt encouraged to continue current HEP and follow-up as scheduled.    Rehab Potential Excellent   Clinical Impairments Affecting Rehab Potential Positive: motivated; Negative: age   PT Frequency 1x / week   PT Duration 8 weeks   PT Treatment/Interventions Canalith Repostioning;Therapeutic activities;Vestibular;Balance training;Neuromuscular re-education;Patient/family education;DME Instruction;Gait training;Therapeutic exercise;Manual techniques   PT Next Visit Plan semi-tandem progressions, ambulation with head turns, progress HEP as tolerated; hallway ball toss, forward and retro amb with ball toss over shoulder, airex pad activities    PT Home Exercise Plan VOR x 1 horizontal in standing with feet apart, 1 min x 3, 4x/day, semitandem balance with horizontal head turns 30s x 3 bilateral;   Consulted and Agree with Plan of Care Patient      Patient will benefit from skilled therapeutic intervention in order to improve the following deficits and impairments:  Dizziness, Decreased balance  Visit Diagnosis: Dizziness and giddiness     Problem List Patient Active Problem List   Diagnosis Date Noted  . Malignant neoplasm of prostate ([place]) 07/24/2014   Phillips Grout PT, DPT   Huprich,Jason 10/02/2016, 11:35 AM  [place] MAIN [facility] SERVICES [address] [place], [place], [address] Phone: [phone]   Fax:  [phone]  Name: Carlos Wise MRN: [id number] Date of Birth: 01/31/1941

## 2016-10-09 ENCOUNTER — Ambulatory Visit: Payer: Medicare HMO | Admitting: Physical Therapy

## 2016-10-13 ENCOUNTER — Ambulatory Visit: Payer: Medicare HMO | Attending: Physician Assistant

## 2016-10-13 VITALS — BP 142/79 | HR 91

## 2016-10-13 DIAGNOSIS — R42 Dizziness and giddiness: Secondary | ICD-10-CM | POA: Insufficient documentation

## 2016-10-13 NOTE — Therapy (Signed)
[place] MAIN [facility] SERVICES [address] [place], [place], [address] Phone: [phone]   Fax:  [phone]  Physical Therapy Treatment/Discharge  Patient Details  Name: Carlos Wise MRN: [id number] Date of Birth: August 24, 1940 Referring Provider: Cyndi Bender PA-C  Encounter Date: 20-Oct-2016      PT End of Session - 2016/10/20 1258    Visit Number 5   Number of Visits 8   Date for PT Re-Evaluation Oct 20, 2016   Authorization Type g codes 5/10   PT Start Time 1300   PT Stop Time 1345   PT Time Calculation (min) 45 min   Equipment Utilized During Treatment Gait belt   Activity Tolerance Patient tolerated treatment well   Behavior During Therapy [facility] for tasks assessed/performed      Past Medical History:  Diagnosis Date  . Herpes zoster   . Hyperlipemia   . Hypertension   . Intestinal polyp    hyperplastic  . Prostate cancer ([place])   . S/P radiation therapy 12/12/2014 through 02/06/2015    Prostate 7800 cGy in 40 sessions, seminal vesicles 5600 cGy in 40 sessions     Past Surgical History:  Procedure Laterality Date  . Carotid Artery Catherization    . PROSTATE BIOPSY  06/27/2014    Vitals:   2016/10/20 1302  BP: (!) 142/79  Pulse: 91  SpO2: 99%        Subjective Assessment - Oct 20, 2016 1257    Subjective Pt states he is doing well today. He reports approximately 60% improvement with respect to his symptoms since starting therapy.    Pertinent History Pt reports some problems controlling his balance for the last 6 months. He also describes his symptoms as "dizziness."  He reports symptoms when closing his eyes in the shower, bending forward, and turning his head quickly. He has suffered one fall in the last 12 months but endorses multiple stumbles. His symptoms are worse in the AM and improve when he is distracted. He has an upcoming appointment with neurology  for shooting pain in bilateral hands and feet which occurs briefly and infrequently. He also reports some numbness in bilateral hands and feet but has difficulty describing in greater detail. Denies history of diabetes. He has a history of prostate CA and was successfully treated with radiation as well as hormone therapy. He complains of severe fatigue and saw an allergist who recommended he have his testosterone checked. His lab tests showed extremely low testosterone and he recently had a follow-up with his urologist. His urologist recommended starting him on testosterone replacement therapy if his PSA remains low by June 2018.  Pt denies any recent increase in stress/anxiety. No recent tick bites. No syncopal or presyncopal episodes. Recent physical exam was WNL including labwork. 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 Pt reports some recent night sweats. States he has reported to his primary MD who had an explanation for his symptoms but pt does not recall. Otherwise history is negative for red flags.    Limitations Walking   Diagnostic tests None reported   Patient Stated Goals Decrease dizziness with household and leisure activities   Currently in Pain? No/denies            [facility] PT Assessment - Oct 20, 2016 1302      Observation/Other Assessments   Other Surveys  Other Surveys   Dizziness Handicap Inventory [facility])  14/100     Dynamic Gait  Index   Level Surface Normal   Change in Gait Speed Normal   Gait with Horizontal Head Turns Normal   Gait with Vertical Head Turns Normal   Gait and Pivot Turn Normal   Step Over Obstacle Normal   Step Around Obstacles Normal   Steps Normal   Total Score 24      Neuromuscular Re-education:  Pt completed DHI (unbilled); Performed DGI with patient who scored 24/24; Updated goals and discussed plan of care with patient;  VOR X 1 exercise:  VOR X 1 horizontal with conflicting  background in standing with feet together x 1 minute, good technique noted without cues, denies dizziness; VOR X 1 horizontal with conflicting background in semitandem x 1 minute, VOR x 1 horizontal with conflicting background feet together on Airex x 1 minute;   Semitandem Semitandem stance alternating LE with smooth horizontal head turns/eye follow x 30s each; Tandem stance alternating LE with smooth horizontal head turns/eye follow x 30s each;  Tandem Walking  On Airex balance beam performed tandem walking 5', 6 lengths in // bars without UEs support with CGA. Patient with infrequent 2 finger touches on parallel bars to stabilize; On Airex balance beam retro ambulation 5', 2 lengths in // bars with intemrittent UE support; Side stepping on Airex 5' x 4 lengths;  Single leg balance Single leg balance x 30s bilaterally;  Pt provided written HEP with extensive education and directions about how progress exercises to make more challenging as he improves.                        PT Education - 10/13/16 1257    Education provided Yes   Education Details Goals, discharge, HEP progression, indicates for return if needed   Person(s) Educated Patient   Methods Explanation;Demonstration;Verbal cues;Handout   Comprehension Verbalized understanding;Returned demonstration             PT Long Term Goals - 10/13/16 1302      PT LONG TERM GOAL #1   Title Pt will be independent with HEP in order to decrease symptoms and improve function at home and with leisure activities   Time 8   Period Weeks   Status Achieved     PT LONG TERM GOAL #2   Title Patient will report 50% or greater improvement in his symptoms of dizziness and imbalance with provoking motions or positions.    Baseline 10/13/16: 60% improvement in symptoms since starting therapy   Time 8   Period Weeks   Status Achieved     PT LONG TERM GOAL #3   Title Pt will improve DGI to at least 23/24 in order to  demonstrate normal gait with horizontal and vertical head turns with ambulation   Baseline 08/18/16: 21/24; 10/13/16: 24/24   Time 8   Period Weeks   Status Achieved     PT LONG TERM GOAL #4   Title Pt will decrease DHI score by at least 18 points in order to demonstrate clinically significant reduction in disability    Baseline 09/18/16: 36/100; 10/13/16: 14/100   Time 8   Period Weeks   Status Achieved               Plan - 10/13/16 1258    Clinical Impression Statement Pt reports approximately 60% improvement in symptoms since starting therapy. His DGI improved from 21/24 to 24/24 today and his Grant decreased from 36/100 at the initial evaluation to 14/100 today. He  is independent with his home exercise program and pleased with his progress at this time. Pt will be discharged at this time having met his goals and due to patient preference to continue home program independently. He was provided an appropriate exercise progression and encouraged to obtain a new order to return if his symptoms do not continue to improve or worsen.    Rehab Potential Excellent   Clinical Impairments Affecting Rehab Potential Positive: motivated; Negative: age   PT Frequency 1x / week   PT Duration 8 weeks   PT Treatment/Interventions Canalith Repostioning;Therapeutic activities;Vestibular;Balance training;Neuromuscular re-education;Patient/family education;DME Instruction;Gait training;Therapeutic exercise;Manual techniques   PT Next Visit Plan Discharge   PT Home Exercise Plan VOR x 1 horizontal in standing with feet together and target on busy background, 1 min x 3, 4x/day, semitandem balance with horizontal head turns 30s x 3 bilateral; single leg balance; Pt provided possible progressions for both VOR and semitandem head turning exercises to make more difficult   Consulted and Agree with Plan of Care Patient      Patient will benefit from skilled therapeutic intervention in order to improve the following  deficits and impairments:  Dizziness, Decreased balance  Visit Diagnosis: Dizziness and giddiness       G-Codes - 10-26-16 1549    Functional Assessment Tool Used (Outpatient Only) clinical judgment, DGI, DHI   Functional Limitation Mobility: Walking and moving around   Mobility: Walking and Moving Around Goal Status [phone]) At least 1 percent but less than 20 percent impaired, limited or restricted   Mobility: Walking and Moving Around Discharge Status [phone]) At least 1 percent but less than 20 percent impaired, limited or restricted      Problem List Patient Active Problem List   Diagnosis Date Noted  . Malignant neoplasm of prostate ([place]) 07/24/2014   Phillips Grout PT, DPT   Karalee Hauter Oct 26, 2016, 3:52 PM  [place] MAIN [facility] SERVICES [address] [place], [place], [address] Phone: [phone]   Fax:  [phone]  Name: Carlos Wise MRN: [id number] Date of Birth: November 18, 1940

## 2016-11-23 DIAGNOSIS — M48061 Spinal stenosis, lumbar region without neurogenic claudication: Secondary | ICD-10-CM | POA: Diagnosis not present

## 2016-11-23 DIAGNOSIS — R202 Paresthesia of skin: Secondary | ICD-10-CM | POA: Diagnosis not present

## 2016-11-24 DIAGNOSIS — C61 Malignant neoplasm of prostate: Secondary | ICD-10-CM | POA: Diagnosis not present

## 2016-11-30 DIAGNOSIS — C61 Malignant neoplasm of prostate: Secondary | ICD-10-CM | POA: Diagnosis not present

## 2016-11-30 DIAGNOSIS — E291 Testicular hypofunction: Secondary | ICD-10-CM | POA: Diagnosis not present

## 2016-11-30 DIAGNOSIS — R3916 Straining to void: Secondary | ICD-10-CM | POA: Diagnosis not present

## 2016-12-09 ENCOUNTER — Ambulatory Visit: Admit: 2016-12-09 | Payer: Medicare HMO | Admitting: Unknown Physician Specialty

## 2016-12-09 SURGERY — COLONOSCOPY WITH PROPOFOL
Anesthesia: General

## 2017-01-25 DIAGNOSIS — C61 Malignant neoplasm of prostate: Secondary | ICD-10-CM | POA: Diagnosis not present

## 2017-01-25 DIAGNOSIS — R3915 Urgency of urination: Secondary | ICD-10-CM | POA: Diagnosis not present

## 2017-01-25 DIAGNOSIS — R350 Frequency of micturition: Secondary | ICD-10-CM | POA: Diagnosis not present

## 2017-01-25 DIAGNOSIS — E291 Testicular hypofunction: Secondary | ICD-10-CM | POA: Diagnosis not present

## 2017-01-26 DIAGNOSIS — Z79899 Other long term (current) drug therapy: Secondary | ICD-10-CM | POA: Diagnosis not present

## 2017-01-26 DIAGNOSIS — E349 Endocrine disorder, unspecified: Secondary | ICD-10-CM | POA: Diagnosis not present

## 2017-01-26 DIAGNOSIS — G629 Polyneuropathy, unspecified: Secondary | ICD-10-CM | POA: Diagnosis not present

## 2017-01-26 DIAGNOSIS — Z9181 History of falling: Secondary | ICD-10-CM | POA: Diagnosis not present

## 2017-01-26 DIAGNOSIS — I10 Essential (primary) hypertension: Secondary | ICD-10-CM | POA: Diagnosis not present

## 2017-01-26 DIAGNOSIS — Z6830 Body mass index (BMI) 30.0-30.9, adult: Secondary | ICD-10-CM | POA: Diagnosis not present

## 2017-01-26 DIAGNOSIS — K219 Gastro-esophageal reflux disease without esophagitis: Secondary | ICD-10-CM | POA: Diagnosis not present

## 2017-01-26 DIAGNOSIS — E7800 Pure hypercholesterolemia, unspecified: Secondary | ICD-10-CM | POA: Diagnosis not present

## 2017-01-27 DIAGNOSIS — R202 Paresthesia of skin: Secondary | ICD-10-CM | POA: Diagnosis not present

## 2017-01-27 DIAGNOSIS — M48061 Spinal stenosis, lumbar region without neurogenic claudication: Secondary | ICD-10-CM | POA: Diagnosis not present

## 2017-03-05 DIAGNOSIS — E871 Hypo-osmolality and hyponatremia: Secondary | ICD-10-CM | POA: Diagnosis not present

## 2017-04-13 DIAGNOSIS — R8271 Bacteriuria: Secondary | ICD-10-CM | POA: Diagnosis not present

## 2017-04-13 DIAGNOSIS — C61 Malignant neoplasm of prostate: Secondary | ICD-10-CM | POA: Diagnosis not present

## 2017-04-13 DIAGNOSIS — R338 Other retention of urine: Secondary | ICD-10-CM | POA: Diagnosis not present

## 2017-04-20 DIAGNOSIS — R338 Other retention of urine: Secondary | ICD-10-CM | POA: Diagnosis not present

## 2017-04-27 DIAGNOSIS — R338 Other retention of urine: Secondary | ICD-10-CM | POA: Diagnosis not present

## 2017-05-11 DIAGNOSIS — I10 Essential (primary) hypertension: Secondary | ICD-10-CM | POA: Diagnosis not present

## 2017-05-11 DIAGNOSIS — Z1331 Encounter for screening for depression: Secondary | ICD-10-CM | POA: Diagnosis not present

## 2017-05-11 DIAGNOSIS — Z6831 Body mass index (BMI) 31.0-31.9, adult: Secondary | ICD-10-CM | POA: Diagnosis not present

## 2017-05-11 DIAGNOSIS — Z23 Encounter for immunization: Secondary | ICD-10-CM | POA: Diagnosis not present

## 2017-05-11 DIAGNOSIS — G629 Polyneuropathy, unspecified: Secondary | ICD-10-CM | POA: Diagnosis not present

## 2017-05-27 DIAGNOSIS — E291 Testicular hypofunction: Secondary | ICD-10-CM | POA: Diagnosis not present

## 2017-05-27 DIAGNOSIS — C61 Malignant neoplasm of prostate: Secondary | ICD-10-CM | POA: Diagnosis not present

## 2017-06-03 DIAGNOSIS — C61 Malignant neoplasm of prostate: Secondary | ICD-10-CM | POA: Diagnosis not present

## 2017-07-21 DIAGNOSIS — S76111A Strain of right quadriceps muscle, fascia and tendon, initial encounter: Secondary | ICD-10-CM | POA: Diagnosis not present

## 2017-07-21 DIAGNOSIS — Z6831 Body mass index (BMI) 31.0-31.9, adult: Secondary | ICD-10-CM | POA: Diagnosis not present

## 2017-09-02 DIAGNOSIS — Z8546 Personal history of malignant neoplasm of prostate: Secondary | ICD-10-CM | POA: Diagnosis not present

## 2017-10-14 DIAGNOSIS — R3915 Urgency of urination: Secondary | ICD-10-CM | POA: Diagnosis not present

## 2017-10-14 DIAGNOSIS — R350 Frequency of micturition: Secondary | ICD-10-CM | POA: Diagnosis not present

## 2017-10-21 DIAGNOSIS — R0602 Shortness of breath: Secondary | ICD-10-CM | POA: Diagnosis not present

## 2017-10-21 DIAGNOSIS — Z1211 Encounter for screening for malignant neoplasm of colon: Secondary | ICD-10-CM | POA: Diagnosis not present

## 2017-10-21 DIAGNOSIS — Z0000 Encounter for general adult medical examination without abnormal findings: Secondary | ICD-10-CM | POA: Diagnosis not present

## 2017-10-21 DIAGNOSIS — Z125 Encounter for screening for malignant neoplasm of prostate: Secondary | ICD-10-CM | POA: Diagnosis not present

## 2017-10-21 DIAGNOSIS — E785 Hyperlipidemia, unspecified: Secondary | ICD-10-CM | POA: Diagnosis not present

## 2017-10-21 DIAGNOSIS — Z136 Encounter for screening for cardiovascular disorders: Secondary | ICD-10-CM | POA: Diagnosis not present

## 2017-10-21 DIAGNOSIS — Z6831 Body mass index (BMI) 31.0-31.9, adult: Secondary | ICD-10-CM | POA: Diagnosis not present

## 2017-10-21 DIAGNOSIS — E669 Obesity, unspecified: Secondary | ICD-10-CM | POA: Diagnosis not present

## 2017-11-18 ENCOUNTER — Encounter: Payer: Self-pay | Admitting: Family Medicine

## 2017-12-02 DIAGNOSIS — Z8546 Personal history of malignant neoplasm of prostate: Secondary | ICD-10-CM | POA: Diagnosis not present

## 2017-12-07 DIAGNOSIS — R3915 Urgency of urination: Secondary | ICD-10-CM | POA: Diagnosis not present

## 2017-12-07 DIAGNOSIS — Z8546 Personal history of malignant neoplasm of prostate: Secondary | ICD-10-CM | POA: Diagnosis not present

## 2017-12-07 DIAGNOSIS — N5201 Erectile dysfunction due to arterial insufficiency: Secondary | ICD-10-CM | POA: Diagnosis not present

## 2018-02-11 DIAGNOSIS — R339 Retention of urine, unspecified: Secondary | ICD-10-CM | POA: Diagnosis not present

## 2018-02-15 DIAGNOSIS — N35812 Other urethral bulbous stricture, male: Secondary | ICD-10-CM | POA: Diagnosis not present

## 2018-02-15 DIAGNOSIS — R339 Retention of urine, unspecified: Secondary | ICD-10-CM | POA: Diagnosis not present

## 2018-02-23 DIAGNOSIS — R339 Retention of urine, unspecified: Secondary | ICD-10-CM | POA: Diagnosis not present

## 2018-02-23 DIAGNOSIS — N35912 Unspecified bulbous urethral stricture, male: Secondary | ICD-10-CM | POA: Diagnosis not present

## 2018-02-23 DIAGNOSIS — N35812 Other urethral bulbous stricture, male: Secondary | ICD-10-CM | POA: Diagnosis not present

## 2018-02-23 DIAGNOSIS — N35913 Unspecified membranous urethral stricture, male: Secondary | ICD-10-CM | POA: Diagnosis not present

## 2018-02-24 DIAGNOSIS — N35812 Other urethral bulbous stricture, male: Secondary | ICD-10-CM | POA: Diagnosis not present

## 2018-03-04 DIAGNOSIS — N35812 Other urethral bulbous stricture, male: Secondary | ICD-10-CM | POA: Diagnosis not present

## 2018-03-15 DIAGNOSIS — R69 Illness, unspecified: Secondary | ICD-10-CM | POA: Diagnosis not present

## 2018-03-15 DIAGNOSIS — Z6831 Body mass index (BMI) 31.0-31.9, adult: Secondary | ICD-10-CM | POA: Diagnosis not present

## 2018-03-15 DIAGNOSIS — Z23 Encounter for immunization: Secondary | ICD-10-CM | POA: Diagnosis not present

## 2018-05-25 DIAGNOSIS — R69 Illness, unspecified: Secondary | ICD-10-CM | POA: Diagnosis not present

## 2018-05-25 DIAGNOSIS — Z6831 Body mass index (BMI) 31.0-31.9, adult: Secondary | ICD-10-CM | POA: Diagnosis not present

## 2018-05-25 DIAGNOSIS — I10 Essential (primary) hypertension: Secondary | ICD-10-CM | POA: Diagnosis not present

## 2018-05-25 DIAGNOSIS — Z9181 History of falling: Secondary | ICD-10-CM | POA: Diagnosis not present

## 2018-06-02 DIAGNOSIS — Z8546 Personal history of malignant neoplasm of prostate: Secondary | ICD-10-CM | POA: Diagnosis not present

## 2018-06-14 DIAGNOSIS — Z8546 Personal history of malignant neoplasm of prostate: Secondary | ICD-10-CM | POA: Diagnosis not present

## 2018-06-14 DIAGNOSIS — N35812 Other urethral bulbous stricture, male: Secondary | ICD-10-CM | POA: Diagnosis not present

## 2018-06-14 DIAGNOSIS — N5201 Erectile dysfunction due to arterial insufficiency: Secondary | ICD-10-CM | POA: Diagnosis not present

## 2018-07-11 DIAGNOSIS — H6123 Impacted cerumen, bilateral: Secondary | ICD-10-CM | POA: Diagnosis not present

## 2018-07-11 DIAGNOSIS — J301 Allergic rhinitis due to pollen: Secondary | ICD-10-CM | POA: Diagnosis not present

## 2018-07-11 DIAGNOSIS — H6980 Other specified disorders of Eustachian tube, unspecified ear: Secondary | ICD-10-CM | POA: Diagnosis not present

## 2018-07-25 DIAGNOSIS — R69 Illness, unspecified: Secondary | ICD-10-CM | POA: Diagnosis not present

## 2018-07-28 DIAGNOSIS — Z6830 Body mass index (BMI) 30.0-30.9, adult: Secondary | ICD-10-CM | POA: Diagnosis not present

## 2018-07-28 DIAGNOSIS — R69 Illness, unspecified: Secondary | ICD-10-CM | POA: Diagnosis not present

## 2018-11-15 DIAGNOSIS — R3915 Urgency of urination: Secondary | ICD-10-CM | POA: Diagnosis not present

## 2018-11-15 DIAGNOSIS — R350 Frequency of micturition: Secondary | ICD-10-CM | POA: Diagnosis not present

## 2018-11-22 DIAGNOSIS — Z8546 Personal history of malignant neoplasm of prostate: Secondary | ICD-10-CM | POA: Diagnosis not present

## 2018-11-29 DIAGNOSIS — Z8546 Personal history of malignant neoplasm of prostate: Secondary | ICD-10-CM | POA: Diagnosis not present

## 2018-11-29 DIAGNOSIS — R3915 Urgency of urination: Secondary | ICD-10-CM | POA: Diagnosis not present

## 2018-12-01 DIAGNOSIS — H9192 Unspecified hearing loss, left ear: Secondary | ICD-10-CM | POA: Diagnosis not present

## 2018-12-01 DIAGNOSIS — I10 Essential (primary) hypertension: Secondary | ICD-10-CM | POA: Diagnosis not present

## 2018-12-01 DIAGNOSIS — E7800 Pure hypercholesterolemia, unspecified: Secondary | ICD-10-CM | POA: Diagnosis not present

## 2018-12-01 DIAGNOSIS — R69 Illness, unspecified: Secondary | ICD-10-CM | POA: Diagnosis not present

## 2018-12-01 DIAGNOSIS — Z1331 Encounter for screening for depression: Secondary | ICD-10-CM | POA: Diagnosis not present

## 2018-12-01 DIAGNOSIS — Z6829 Body mass index (BMI) 29.0-29.9, adult: Secondary | ICD-10-CM | POA: Diagnosis not present

## 2018-12-01 DIAGNOSIS — G629 Polyneuropathy, unspecified: Secondary | ICD-10-CM | POA: Diagnosis not present

## 2018-12-01 DIAGNOSIS — K219 Gastro-esophageal reflux disease without esophagitis: Secondary | ICD-10-CM | POA: Diagnosis not present

## 2018-12-06 DIAGNOSIS — H9120 Sudden idiopathic hearing loss, unspecified ear: Secondary | ICD-10-CM | POA: Diagnosis not present

## 2018-12-06 DIAGNOSIS — H6123 Impacted cerumen, bilateral: Secondary | ICD-10-CM | POA: Diagnosis not present

## 2018-12-06 DIAGNOSIS — H90A22 Sensorineural hearing loss, unilateral, left ear, with restricted hearing on the contralateral side: Secondary | ICD-10-CM | POA: Diagnosis not present

## 2018-12-21 DIAGNOSIS — R42 Dizziness and giddiness: Secondary | ICD-10-CM | POA: Diagnosis not present

## 2018-12-21 DIAGNOSIS — J301 Allergic rhinitis due to pollen: Secondary | ICD-10-CM | POA: Diagnosis not present

## 2018-12-21 DIAGNOSIS — H90A22 Sensorineural hearing loss, unilateral, left ear, with restricted hearing on the contralateral side: Secondary | ICD-10-CM | POA: Diagnosis not present

## 2018-12-30 DIAGNOSIS — N35112 Postinfective bulbous urethral stricture, not elsewhere classified: Secondary | ICD-10-CM | POA: Diagnosis not present

## 2019-01-02 DIAGNOSIS — R339 Retention of urine, unspecified: Secondary | ICD-10-CM | POA: Diagnosis not present

## 2019-01-23 DIAGNOSIS — Z6829 Body mass index (BMI) 29.0-29.9, adult: Secondary | ICD-10-CM | POA: Diagnosis not present

## 2019-01-23 DIAGNOSIS — G629 Polyneuropathy, unspecified: Secondary | ICD-10-CM | POA: Diagnosis not present

## 2019-01-23 DIAGNOSIS — Z79899 Other long term (current) drug therapy: Secondary | ICD-10-CM | POA: Diagnosis not present

## 2019-01-23 DIAGNOSIS — R69 Illness, unspecified: Secondary | ICD-10-CM | POA: Diagnosis not present

## 2019-01-23 DIAGNOSIS — E7800 Pure hypercholesterolemia, unspecified: Secondary | ICD-10-CM | POA: Diagnosis not present

## 2019-01-23 DIAGNOSIS — R5383 Other fatigue: Secondary | ICD-10-CM | POA: Diagnosis not present

## 2019-01-23 DIAGNOSIS — I10 Essential (primary) hypertension: Secondary | ICD-10-CM | POA: Diagnosis not present

## 2019-01-30 DIAGNOSIS — Z79899 Other long term (current) drug therapy: Secondary | ICD-10-CM | POA: Diagnosis not present

## 2019-01-30 DIAGNOSIS — E7800 Pure hypercholesterolemia, unspecified: Secondary | ICD-10-CM | POA: Diagnosis not present

## 2019-01-30 DIAGNOSIS — R5383 Other fatigue: Secondary | ICD-10-CM | POA: Diagnosis not present

## 2019-02-09 DIAGNOSIS — Z8546 Personal history of malignant neoplasm of prostate: Secondary | ICD-10-CM | POA: Diagnosis not present

## 2019-02-13 DIAGNOSIS — R339 Retention of urine, unspecified: Secondary | ICD-10-CM | POA: Diagnosis not present

## 2019-03-08 DIAGNOSIS — R69 Illness, unspecified: Secondary | ICD-10-CM | POA: Diagnosis not present

## 2019-03-15 DIAGNOSIS — R339 Retention of urine, unspecified: Secondary | ICD-10-CM | POA: Diagnosis not present

## 2019-04-24 DIAGNOSIS — R339 Retention of urine, unspecified: Secondary | ICD-10-CM | POA: Diagnosis not present

## 2019-05-22 DIAGNOSIS — R69 Illness, unspecified: Secondary | ICD-10-CM | POA: Diagnosis not present

## 2019-05-23 DIAGNOSIS — R69 Illness, unspecified: Secondary | ICD-10-CM | POA: Diagnosis not present

## 2019-05-31 DIAGNOSIS — R339 Retention of urine, unspecified: Secondary | ICD-10-CM | POA: Diagnosis not present

## 2019-08-07 DIAGNOSIS — E7800 Pure hypercholesterolemia, unspecified: Secondary | ICD-10-CM | POA: Diagnosis not present

## 2019-08-07 DIAGNOSIS — R69 Illness, unspecified: Secondary | ICD-10-CM | POA: Diagnosis not present

## 2019-08-07 DIAGNOSIS — K219 Gastro-esophageal reflux disease without esophagitis: Secondary | ICD-10-CM | POA: Diagnosis not present

## 2019-08-07 DIAGNOSIS — G629 Polyneuropathy, unspecified: Secondary | ICD-10-CM | POA: Diagnosis not present

## 2019-08-07 DIAGNOSIS — I10 Essential (primary) hypertension: Secondary | ICD-10-CM | POA: Diagnosis not present

## 2019-08-07 DIAGNOSIS — Z6830 Body mass index (BMI) 30.0-30.9, adult: Secondary | ICD-10-CM | POA: Diagnosis not present

## 2019-08-21 DIAGNOSIS — R339 Retention of urine, unspecified: Secondary | ICD-10-CM | POA: Diagnosis not present

## 2019-09-15 DIAGNOSIS — R339 Retention of urine, unspecified: Secondary | ICD-10-CM | POA: Diagnosis not present

## 2019-10-24 DIAGNOSIS — R339 Retention of urine, unspecified: Secondary | ICD-10-CM | POA: Diagnosis not present

## 2019-11-20 DIAGNOSIS — X32XXXS Exposure to sunlight, sequela: Secondary | ICD-10-CM | POA: Diagnosis not present

## 2019-11-20 DIAGNOSIS — L573 Poikiloderma of Civatte: Secondary | ICD-10-CM | POA: Diagnosis not present

## 2019-11-20 DIAGNOSIS — R69 Illness, unspecified: Secondary | ICD-10-CM | POA: Diagnosis not present

## 2019-12-15 DIAGNOSIS — R339 Retention of urine, unspecified: Secondary | ICD-10-CM | POA: Diagnosis not present

## 2020-01-04 DIAGNOSIS — X32XXXS Exposure to sunlight, sequela: Secondary | ICD-10-CM | POA: Diagnosis not present

## 2020-01-04 DIAGNOSIS — R69 Illness, unspecified: Secondary | ICD-10-CM | POA: Diagnosis not present

## 2020-01-04 DIAGNOSIS — L573 Poikiloderma of Civatte: Secondary | ICD-10-CM | POA: Diagnosis not present

## 2020-01-23 DIAGNOSIS — R339 Retention of urine, unspecified: Secondary | ICD-10-CM | POA: Diagnosis not present

## 2020-01-30 DIAGNOSIS — R69 Illness, unspecified: Secondary | ICD-10-CM | POA: Diagnosis not present

## 2020-01-30 DIAGNOSIS — G629 Polyneuropathy, unspecified: Secondary | ICD-10-CM | POA: Diagnosis not present

## 2020-01-30 DIAGNOSIS — G3184 Mild cognitive impairment, so stated: Secondary | ICD-10-CM | POA: Diagnosis not present

## 2020-01-30 DIAGNOSIS — I10 Essential (primary) hypertension: Secondary | ICD-10-CM | POA: Diagnosis not present

## 2020-01-30 DIAGNOSIS — Z809 Family history of malignant neoplasm, unspecified: Secondary | ICD-10-CM | POA: Diagnosis not present

## 2020-01-30 DIAGNOSIS — Z720 Tobacco use: Secondary | ICD-10-CM | POA: Diagnosis not present

## 2020-01-30 DIAGNOSIS — Z8249 Family history of ischemic heart disease and other diseases of the circulatory system: Secondary | ICD-10-CM | POA: Diagnosis not present

## 2020-01-30 DIAGNOSIS — R32 Unspecified urinary incontinence: Secondary | ICD-10-CM | POA: Diagnosis not present

## 2020-01-30 DIAGNOSIS — K219 Gastro-esophageal reflux disease without esophagitis: Secondary | ICD-10-CM | POA: Diagnosis not present

## 2020-01-30 DIAGNOSIS — N529 Male erectile dysfunction, unspecified: Secondary | ICD-10-CM | POA: Diagnosis not present

## 2020-02-04 ENCOUNTER — Observation Stay (HOSPITAL_COMMUNITY): Payer: Medicare HMO

## 2020-02-04 ENCOUNTER — Emergency Department (HOSPITAL_COMMUNITY): Payer: Medicare HMO

## 2020-02-04 ENCOUNTER — Inpatient Hospital Stay (HOSPITAL_COMMUNITY)
Admission: EM | Admit: 2020-02-04 | Discharge: 2020-02-09 | DRG: 100 | Disposition: A | Discharge status: Skilled Nursing Facility | Payer: Medicare HMO | Attending: Internal Medicine | Admitting: Internal Medicine

## 2020-02-04 ENCOUNTER — Encounter (HOSPITAL_COMMUNITY): Payer: Self-pay | Admitting: Radiology

## 2020-02-04 ENCOUNTER — Observation Stay (HOSPITAL_BASED_OUTPATIENT_CLINIC_OR_DEPARTMENT_OTHER): Payer: Medicare HMO

## 2020-02-04 DIAGNOSIS — K219 Gastro-esophageal reflux disease without esophagitis: Secondary | ICD-10-CM | POA: Diagnosis present

## 2020-02-04 DIAGNOSIS — I110 Hypertensive heart disease with heart failure: Secondary | ICD-10-CM | POA: Diagnosis present

## 2020-02-04 DIAGNOSIS — C61 Malignant neoplasm of prostate: Secondary | ICD-10-CM | POA: Diagnosis present

## 2020-02-04 DIAGNOSIS — Z7289 Other problems related to lifestyle: Secondary | ICD-10-CM | POA: Diagnosis not present

## 2020-02-04 DIAGNOSIS — F1010 Alcohol abuse, uncomplicated: Secondary | ICD-10-CM | POA: Diagnosis present

## 2020-02-04 DIAGNOSIS — R9431 Abnormal electrocardiogram [ECG] [EKG]: Secondary | ICD-10-CM | POA: Diagnosis present

## 2020-02-04 DIAGNOSIS — I5032 Chronic diastolic (congestive) heart failure: Secondary | ICD-10-CM | POA: Diagnosis present

## 2020-02-04 DIAGNOSIS — Z8249 Family history of ischemic heart disease and other diseases of the circulatory system: Secondary | ICD-10-CM

## 2020-02-04 DIAGNOSIS — R0689 Other abnormalities of breathing: Secondary | ICD-10-CM | POA: Diagnosis not present

## 2020-02-04 DIAGNOSIS — R471 Dysarthria and anarthria: Secondary | ICD-10-CM | POA: Diagnosis present

## 2020-02-04 DIAGNOSIS — F1090 Alcohol use, unspecified, uncomplicated: Secondary | ICD-10-CM

## 2020-02-04 DIAGNOSIS — Z87891 Personal history of nicotine dependence: Secondary | ICD-10-CM

## 2020-02-04 DIAGNOSIS — I6523 Occlusion and stenosis of bilateral carotid arteries: Secondary | ICD-10-CM | POA: Diagnosis not present

## 2020-02-04 DIAGNOSIS — Y900 Blood alcohol level of less than 20 mg/100 ml: Secondary | ICD-10-CM | POA: Diagnosis present

## 2020-02-04 DIAGNOSIS — R404 Transient alteration of awareness: Secondary | ICD-10-CM | POA: Diagnosis not present

## 2020-02-04 DIAGNOSIS — R4701 Aphasia: Secondary | ICD-10-CM | POA: Diagnosis not present

## 2020-02-04 DIAGNOSIS — Z79899 Other long term (current) drug therapy: Secondary | ICD-10-CM

## 2020-02-04 DIAGNOSIS — Z789 Other specified health status: Secondary | ICD-10-CM

## 2020-02-04 DIAGNOSIS — D72829 Elevated white blood cell count, unspecified: Secondary | ICD-10-CM | POA: Diagnosis present

## 2020-02-04 DIAGNOSIS — R2990 Unspecified symptoms and signs involving the nervous system: Secondary | ICD-10-CM

## 2020-02-04 DIAGNOSIS — R29818 Other symptoms and signs involving the nervous system: Secondary | ICD-10-CM | POA: Diagnosis not present

## 2020-02-04 DIAGNOSIS — G629 Polyneuropathy, unspecified: Secondary | ICD-10-CM | POA: Diagnosis present

## 2020-02-04 DIAGNOSIS — R4182 Altered mental status, unspecified: Secondary | ICD-10-CM | POA: Diagnosis not present

## 2020-02-04 DIAGNOSIS — Z20822 Contact with and (suspected) exposure to covid-19: Secondary | ICD-10-CM | POA: Diagnosis present

## 2020-02-04 DIAGNOSIS — Z880 Allergy status to penicillin: Secondary | ICD-10-CM

## 2020-02-04 DIAGNOSIS — Z8042 Family history of malignant neoplasm of prostate: Secondary | ICD-10-CM

## 2020-02-04 DIAGNOSIS — G9341 Metabolic encephalopathy: Secondary | ICD-10-CM | POA: Diagnosis present

## 2020-02-04 DIAGNOSIS — I10 Essential (primary) hypertension: Secondary | ICD-10-CM | POA: Diagnosis not present

## 2020-02-04 DIAGNOSIS — G40909 Epilepsy, unspecified, not intractable, without status epilepticus: Secondary | ICD-10-CM | POA: Diagnosis not present

## 2020-02-04 DIAGNOSIS — Z7982 Long term (current) use of aspirin: Secondary | ICD-10-CM

## 2020-02-04 DIAGNOSIS — E871 Hypo-osmolality and hyponatremia: Secondary | ICD-10-CM | POA: Diagnosis not present

## 2020-02-04 DIAGNOSIS — R0989 Other specified symptoms and signs involving the circulatory and respiratory systems: Secondary | ICD-10-CM | POA: Diagnosis not present

## 2020-02-04 DIAGNOSIS — I6782 Cerebral ischemia: Secondary | ICD-10-CM | POA: Diagnosis not present

## 2020-02-04 DIAGNOSIS — R0902 Hypoxemia: Secondary | ICD-10-CM | POA: Diagnosis not present

## 2020-02-04 DIAGNOSIS — H9190 Unspecified hearing loss, unspecified ear: Secondary | ICD-10-CM | POA: Diagnosis present

## 2020-02-04 DIAGNOSIS — I6389 Other cerebral infarction: Secondary | ICD-10-CM | POA: Diagnosis not present

## 2020-02-04 DIAGNOSIS — R569 Unspecified convulsions: Secondary | ICD-10-CM

## 2020-02-04 DIAGNOSIS — E785 Hyperlipidemia, unspecified: Secondary | ICD-10-CM | POA: Diagnosis present

## 2020-02-04 DIAGNOSIS — R279 Unspecified lack of coordination: Secondary | ICD-10-CM | POA: Diagnosis present

## 2020-02-04 DIAGNOSIS — Z8546 Personal history of malignant neoplasm of prostate: Secondary | ICD-10-CM

## 2020-02-04 DIAGNOSIS — R9401 Abnormal electroencephalogram [EEG]: Secondary | ICD-10-CM | POA: Diagnosis present

## 2020-02-04 DIAGNOSIS — R9082 White matter disease, unspecified: Secondary | ICD-10-CM | POA: Diagnosis not present

## 2020-02-04 DIAGNOSIS — Z923 Personal history of irradiation: Secondary | ICD-10-CM

## 2020-02-04 LAB — CBC
HCT: 43.5 % (ref 39.0–52.0)
Hemoglobin: 15.2 g/dL (ref 13.0–17.0)
MCH: 32 pg (ref 26.0–34.0)
MCHC: 34.9 g/dL (ref 30.0–36.0)
MCV: 91.6 fL (ref 80.0–100.0)
Platelets: 231 10*3/uL (ref 150–400)
RBC: 4.75 MIL/uL (ref 4.22–5.81)
RDW: 11.9 % (ref 11.5–15.5)
WBC: 12.2 10*3/uL — ABNORMAL HIGH (ref 4.0–10.5)
nRBC: 0 % (ref 0.0–0.2)

## 2020-02-04 LAB — PHOSPHORUS: Phosphorus: 3.6 mg/dL (ref 2.5–4.6)

## 2020-02-04 LAB — I-STAT CHEM 8, ED
BUN: 10 mg/dL (ref 8–23)
Calcium, Ion: 1.1 mmol/L — ABNORMAL LOW (ref 1.15–1.40)
Chloride: 94 mmol/L — ABNORMAL LOW (ref 98–111)
Creatinine, Ser: 0.8 mg/dL (ref 0.61–1.24)
Glucose, Bld: 156 mg/dL — ABNORMAL HIGH (ref 70–99)
HCT: 45 % (ref 39.0–52.0)
Hemoglobin: 15.3 g/dL (ref 13.0–17.0)
Potassium: 4.2 mmol/L (ref 3.5–5.1)
Sodium: 128 mmol/L — ABNORMAL LOW (ref 135–145)
TCO2: 23 mmol/L (ref 22–32)

## 2020-02-04 LAB — DIFFERENTIAL
Abs Immature Granulocytes: 0.06 10*3/uL (ref 0.00–0.07)
Basophils Absolute: 0.1 10*3/uL (ref 0.0–0.1)
Basophils Relative: 1 %
Eosinophils Absolute: 0 10*3/uL (ref 0.0–0.5)
Eosinophils Relative: 0 %
Immature Granulocytes: 1 %
Lymphocytes Relative: 8 %
Lymphs Abs: 1 10*3/uL (ref 0.7–4.0)
Monocytes Absolute: 0.5 10*3/uL (ref 0.1–1.0)
Monocytes Relative: 4 %
Neutro Abs: 10.6 10*3/uL — ABNORMAL HIGH (ref 1.7–7.7)
Neutrophils Relative %: 86 %

## 2020-02-04 LAB — COMPREHENSIVE METABOLIC PANEL WITH GFR
ALT: 17 U/L (ref 0–44)
AST: 23 U/L (ref 15–41)
Albumin: 4.3 g/dL (ref 3.5–5.0)
Alkaline Phosphatase: 66 U/L (ref 38–126)
Anion gap: 14 (ref 5–15)
BUN: 10 mg/dL (ref 8–23)
CO2: 21 mmol/L — ABNORMAL LOW (ref 22–32)
Calcium: 9.5 mg/dL (ref 8.9–10.3)
Chloride: 92 mmol/L — ABNORMAL LOW (ref 98–111)
Creatinine, Ser: 0.9 mg/dL (ref 0.61–1.24)
GFR calc Af Amer: >60 mL/min
GFR calc non Af Amer: >60 mL/min
Glucose, Bld: 154 mg/dL — ABNORMAL HIGH (ref 70–99)
Potassium: 4.2 mmol/L (ref 3.5–5.1)
Sodium: 127 mmol/L — ABNORMAL LOW (ref 135–145)
Total Bilirubin: 1.7 mg/dL — ABNORMAL HIGH (ref 0.3–1.2)
Total Protein: 7.6 g/dL (ref 6.5–8.1)

## 2020-02-04 LAB — LIPID PANEL
Cholesterol: 274 mg/dL — ABNORMAL HIGH (ref 0–200)
HDL: 61 mg/dL
LDL Cholesterol: 193 mg/dL — ABNORMAL HIGH (ref 0–99)
Total CHOL/HDL Ratio: 4.5 ratio
Triglycerides: 98 mg/dL
VLDL: 20 mg/dL (ref 0–40)

## 2020-02-04 LAB — ETHANOL: Alcohol, Ethyl (B): <10 mg/dL (ref <10)

## 2020-02-04 LAB — SARS CORONAVIRUS 2 BY RT PCR (HOSPITAL ORDER, PERFORMED IN ~~LOC~~ HOSPITAL LAB): SARS Coronavirus 2: NEGATIVE

## 2020-02-04 LAB — ECHOCARDIOGRAM COMPLETE
Area-P 1/2: 3.34 cm2
S' Lateral: 2.8 cm
Single Plane A4C EF: 61.5 %

## 2020-02-04 LAB — APTT: aPTT: 26 seconds (ref 24–36)

## 2020-02-04 LAB — HEMOGLOBIN A1C
Hgb A1c MFr Bld: 5.6 % (ref 4.8–5.6)
Mean Plasma Glucose: 114.02 mg/dL

## 2020-02-04 LAB — MAGNESIUM: Magnesium: 1.9 mg/dL (ref 1.7–2.4)

## 2020-02-04 LAB — CBG MONITORING, ED: Glucose-Capillary: 153 mg/dL — ABNORMAL HIGH (ref 70–99)

## 2020-02-04 MED ORDER — LORAZEPAM 2 MG/ML IJ SOLN
0.0000 mg | Freq: Four times a day (QID) | INTRAMUSCULAR | Status: DC
  Administered 2020-02-04: 1 mg via INTRAVENOUS
  Filled 2020-02-04: qty 1

## 2020-02-04 MED ORDER — SODIUM CHLORIDE 0.9 % IV SOLN: INTRAVENOUS | Status: DC

## 2020-02-04 MED ORDER — ASPIRIN 325 MG PO TABS
325.0000 mg | ORAL_TABLET | Freq: Every day | ORAL | Status: DC
  Administered 2020-02-06 – 2020-02-09 (×4): 325 mg via ORAL
  Filled 2020-02-04 (×4): qty 1

## 2020-02-04 MED ORDER — HYDRALAZINE HCL 20 MG/ML IJ SOLN: 10.0000 mg | INTRAMUSCULAR | Status: DC | PRN

## 2020-02-04 MED ORDER — LORAZEPAM 1 MG PO TABS
1.0000 mg | ORAL_TABLET | ORAL | Status: AC | PRN
  Filled 2020-02-04: qty 4

## 2020-02-04 MED ORDER — ACETAMINOPHEN 325 MG PO TABS: 650.0000 mg | ORAL_TABLET | ORAL | Status: DC | PRN

## 2020-02-04 MED ORDER — THIAMINE HCL 100 MG/ML IJ SOLN
100.0000 mg | Freq: Every day | INTRAMUSCULAR | Status: DC
  Administered 2020-02-04 – 2020-02-06 (×3): 100 mg via INTRAVENOUS
  Filled 2020-02-04 (×4): qty 2

## 2020-02-04 MED ORDER — FOLIC ACID 1 MG PO TABS
1.0000 mg | ORAL_TABLET | Freq: Every day | ORAL | Status: DC
  Administered 2020-02-07 – 2020-02-09 (×3): 1 mg via ORAL
  Filled 2020-02-04 (×4): qty 1

## 2020-02-04 MED ORDER — ASPIRIN 300 MG RE SUPP
300.0000 mg | Freq: Once | RECTAL | Status: AC
  Administered 2020-02-04: 300 mg via RECTAL
  Filled 2020-02-04: qty 1

## 2020-02-04 MED ORDER — LORAZEPAM 2 MG/ML IJ SOLN
1.0000 mg | Freq: Once | INTRAMUSCULAR | Status: DC
  Filled 2020-02-04: qty 1

## 2020-02-04 MED ORDER — IOHEXOL 350 MG/ML SOLN
100.0000 mL | Freq: Once | INTRAVENOUS | Status: AC | PRN
  Administered 2020-02-04: 100 mL via INTRAVENOUS

## 2020-02-04 MED ORDER — ENOXAPARIN SODIUM 40 MG/0.4ML ~~LOC~~ SOLN
40.0000 mg | SUBCUTANEOUS | Status: DC
  Administered 2020-02-05 – 2020-02-08 (×5): 40 mg via SUBCUTANEOUS
  Filled 2020-02-04 (×5): qty 0.4

## 2020-02-04 MED ORDER — ADULT MULTIVITAMIN W/MINERALS CH
1.0000 | ORAL_TABLET | Freq: Every day | ORAL | Status: DC
  Administered 2020-02-07 – 2020-02-08 (×2): 1 via ORAL
  Filled 2020-02-04 (×3): qty 1

## 2020-02-04 MED ORDER — ACETAMINOPHEN 160 MG/5ML PO SOLN: 650.0000 mg | ORAL | Status: DC | PRN

## 2020-02-04 MED ORDER — STROKE: EARLY STAGES OF RECOVERY BOOK: Freq: Once | Status: DC

## 2020-02-04 MED ORDER — THIAMINE HCL 100 MG PO TABS
100.0000 mg | ORAL_TABLET | Freq: Every day | ORAL | Status: DC
  Administered 2020-02-07 – 2020-02-09 (×3): 100 mg via ORAL
  Filled 2020-02-04 (×3): qty 1

## 2020-02-04 MED ORDER — ASPIRIN 300 MG RE SUPP
300.0000 mg | Freq: Every day | RECTAL | Status: DC
  Administered 2020-02-05: 300 mg via RECTAL
  Filled 2020-02-04 (×5): qty 1

## 2020-02-04 MED ORDER — LORAZEPAM 2 MG/ML IJ SOLN: 0.0000 mg | Freq: Two times a day (BID) | INTRAMUSCULAR | Status: DC

## 2020-02-04 MED ORDER — LORAZEPAM 2 MG/ML IJ SOLN
1.0000 mg | INTRAMUSCULAR | Status: AC | PRN
  Administered 2020-02-05 (×2): 2 mg via INTRAVENOUS
  Filled 2020-02-04 (×3): qty 1

## 2020-02-04 MED ORDER — ACETAMINOPHEN 650 MG RE SUPP: 650.0000 mg | RECTAL | Status: DC | PRN

## 2020-02-04 NOTE — ED Notes (Signed)
Admitting provider notified of difficulties holding still for MRI, verbal order for Ativan 1mg  IV given.

## 2020-02-04 NOTE — Progress Notes (Signed)
Patients wife was tired and wanted to go homne. Someone came to take her home. She requested info from staff.  Staff expressed she has already been told ands she does not understand. Chaplain had prayer with patient and wife to try to calm down the wife who was upset.   02/04/20 2200  Clinical Encounter Type  Visited With Patient and family together  Visit Type Initial  Referral From Chaplain  Consult/Referral To Chaplain  Spiritual Encounters  Spiritual Needs Prayer  Stress Factors  Patient Stress Factors None identified  Family Stress Factors Exhausted  Conard Novak, [place]

## 2020-02-04 NOTE — Consult Note (Signed)
Neurology Consultation Reason for Consult: Aphasia Referring Physician: Vernell Barrier, A  CC: Aphasia  History is obtained from: EMS  HPI: Carlos Wise is a 79 y.o. male with a history with htn, hpl who was normal prior to bed.  His wife found him to be confused this morning and therefore called 911.  Due to aphasia, he was activated as a code stroke and taken for emergent CT scan.  CT/CTA/CTP was negative, despite him appearing as a stroke.  Given the focal symptoms without large vessel occlusion on emergent EEG was obtained as well which does not demonstrate any ongoing seizure activity.   LKW: 9:30 PM 8/21 tpa given?: no, outside of window    ROS: Unable to obtain due to altered mental status.   Past Medical History:  Diagnosis Date  . Herpes zoster   . Hyperlipemia   . Hypertension   . Intestinal polyp    hyperplastic  . Prostate cancer ([place])   . S/P radiation therapy 12/12/2014 through 02/06/2015    Prostate 7800 cGy in 40 sessions, seminal vesicles 5600 cGy in 40 sessions      Family History  Problem Relation Age of Onset  . Colon cancer Mother   . Prostate cancer Father   . Heart attack Father      Social History:  reports that he has quit smoking. He has never used smokeless tobacco. He reports current alcohol use. He reports that he does not use drugs.   Exam: Current vital signs: There were no vitals taken for this visit. Vital signs in last 24 hours:     Physical Exam  Constitutional: Appears well-developed and well-nourished.  Psych: Affect appropriate to situation Eyes: No scleral injection HENT: No OP obstrucion MSK: no joint deformities.  Cardiovascular: Normal rate and regular rhythm.  Respiratory: Effort normal, non-labored breathing GI: Soft.  No distension. There is no tenderness.  Skin: WDI  Neuro: Mental Status: Patient is awake, alert, he is fairly densely aphasic.   He is able to close his eyes to command, but when asked to stick out his tongue he simply opens his mouth.  He does not follow any other commands. Cranial Nerves: II: He does not clearly blink to threat from the right, but does from the left.  Pupils are equal, round, and reactive to light.   III,IV, VI: EOMI without ptosis or diploplia.  V: Facial sensation is symmetric to temperature VII: Facial movement is symmetric.  VIII: hearing is intact to voice X: Uvula elevates symmetrically XI: Shoulder shrug is symmetric. XII: tongue is midline without atrophy or fasciculations.  Motor: He appears to move all extremities equally, but does not cooperate with formal testing. Sensory: He does not appear to respond to noxious stimulation as much on the right leg as the left leg Cerebellar: Does not perform    I have reviewed labs in epic and the results pertinent to this consultation are: Sodium 128  I have reviewed the images obtained: CT/CTA/CTP-negative  Impression: 79 year old male with acute onset aphasia and I suspect a right visual field cut and right hypoesthesia.  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  He is being admitted for secondary risk factor modification and therapy.  Recommendations: - HgbA1c, fasting lipid panel - MRI of the brain without contrast - Frequent neuro checks - Echocardiogram - Carotid dopplers - Prophylactic therapy-Antiplatelet med:  Aspirin - dose 325mg  PO or 300mg  PR - Risk factor modification - Telemetry monitoring - PT consult, OT consult, Speech consult - Stroke team to follow   Roland Rack, MD [facility] [phone]  If 7pm- 7am, please page neurology on call as listed in [place].

## 2020-02-04 NOTE — Progress Notes (Signed)
  Echocardiogram 2D Echocardiogram has been performed.  Merrie Roof F 02/04/2020, 4:09 PM

## 2020-02-04 NOTE — H&P (Signed)
History and Physical    Carlos Wise SJG:[id number] DOB: [birth year] DOA: 02/04/2020  Referring MD/NP/PA: Lennice Sites, MD PCP: [name], MD  Patient coming from: Home via EMS  Chief Complaint: Altered  I have personally briefly reviewed patient's old medical records in [place]   HPI: Carlos Wise is a 79 y.o. male with medical history significant of hypertension, hyperlipidemia, ICA aneurysm s/p coiling , and prostate cancer s/p radiation presents after being found this morning by his wife altered and not making sense with what he was trying to say.  History is limited his wife with the phone.  The patient has seemed normal to bed around 9 PM last night.  He however was up and down throughout most of the night.  His wife noted that he was only coughing.  This time she noted that he seemed very unstable on his feet for which she was assisted around.  Patient apparently drinks cocktails per day on average.  He had received both covid vaccines.  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  Patient was not a TPA candidate as he was out of the window.  Patient was noted to have  blood pressure 169/78 and all other vital signs stable.   Labs significant for WBC 12.2, sodium 127, chloride 92, glucose 154 total bilirubin 1.7.  CT angiogram of the head and neck and CT cerebral perfusion did not show any emergent large vessel occlusion.  Covid screening was pending.  TR[facility] called to admit for further stroke work-up.  Review of Systems  Unable to perform ROS: Medical condition    Past Medical History:  Diagnosis Date  . Herpes zoster   . Hyperlipemia   . Hypertension   . Intestinal polyp    hyperplastic  . Prostate cancer ([place])   . S/P radiation therapy 12/12/2014 through 02/06/2015    Prostate  7800 cGy in 40 sessions, seminal vesicles 5600 cGy in 40 sessions     Past Surgical History:  Procedure Laterality Date  . Carotid Artery Catherization    . PROSTATE BIOPSY  06/27/2014     reports that he has quit smoking. He has never used smokeless tobacco. He reports current alcohol use. He reports that he does not use drugs.  Allergies  Allergen Reactions  . Penicillins     Patient states no allergy but has preference that he does not want this medication    Family History  Problem Relation Age of Onset  . Colon cancer Mother   . Prostate cancer Father   . Heart attack Father     Prior to Admission medications   Medication Sig Start Date End Date Taking? Authorizing Provider  amLODipine (NORVASC) 5 MG tablet Take 2.5 mg by mouth daily.     [provider]  aspirin 81 MG tablet Take 81 mg by mouth daily.    [provider]  cholecalciferol (VITAMIN D) 400 UNITS TABS tablet Take 400 Units by mouth. Vitamin D3    [provider]  Cyanocobalamin (VITAMIN B-12 ER PO) Take by mouth.    [provider]  Multiple Vitamins-Minerals (CENTRUM ADULTS PO) Take 1 tablet by mouth daily.    [provider]  pantoprazole (PROTONIX) 40 MG tablet Take 40 mg by mouth daily.    [provider]  simvastatin (ZOCOR) 40 MG  tablet Take 20 mg by mouth daily.     [provider]  tamsulosin (FLOMAX) 0.4 MG CAPS capsule Take 1 capsule (0.4 mg total) by mouth daily. 01/11/15   Kyung Rudd, MD    Physical Exam:  Constitutional: Elderly male who is alert, but not really following commands Vitals:   02/04/20 1345 02/04/20 1400  BP: (!) 169/78   Pulse:  91  Resp: 16 20  SpO2:  95%   Eyes: PERRL, lids and conjunctivae normal ENMT: Mucous membranes are moist. Posterior pharynx clear of any exudate or lesions. Appears to be hard of hearing. Neck: normal, supple, no masses, no thyromegaly Respiratory: clear to  auscultation bilaterally, no wheezing, no crackles. Normal respiratory effort. No accessory muscle use.  Cardiovascular: Regular rate and rhythm, no murmurs / rubs / gallops. No extremity edema. 2+ pedal pulses. No carotid bruits.  Abdomen: no tenderness, no masses palpated. No hepatosplenomegaly. Bowel sounds positive.  Musculoskeletal: no clubbing / cyanosis. No joint deformity upper and lower extremities. Good ROM, no contractures. Normal muscle tone.  Skin: no rashes, lesions, ulcers. No induration Neurologic: CN 2-12 grossly intact.  Able to speak, but words did not make sense.  Appears also able to move all extremities, but unable to get the patient to follow commands. Psychiatric:  Alert, but unable to assess for orientation.    Labs on Admission: I have personally reviewed following labs and imaging studies  CBC: Recent Labs  Lab 02/04/20 1319 02/04/20 1328  WBC 12.2*  --   NEUTROABS 10.6*  --   HGB 15.2 15.3  HCT 43.5 45.0  MCV 91.6  --   PLT 231  --    Basic Metabolic Panel: Recent Labs  Lab 02/04/20 1319 02/04/20 1328  NA 127* 128*  K 4.2 4.2  CL 92* 94*  CO2 21*  --   GLUCOSE 154* 156*  BUN 10 10  CREATININE 0.90 0.80  CALCIUM 9.5  --    GFR: CrCl cannot be calculated (Unknown ideal weight.). Liver Function Tests: Recent Labs  Lab 02/04/20 1319  AST 23  ALT 17  ALKPHOS 66  BILITOT 1.7*  PROT 7.6  ALBUMIN 4.3   No results for input(s): LIPASE, AMYLASE in the last 168 hours. No results for input(s): AMMONIA in the last 168 hours. Coagulation Profile: No results for input(s): INR, PROTIME in the last 168 hours. Cardiac Enzymes: No results for input(s): CKTOTAL, CKMB, CKMBINDEX, TROPONINI in the last 168 hours. BNP (last 3 results) No results for input(s): PROBNP in the last 8760 hours. HbA1C: No results for input(s): HGBA1C in the last 72 hours. CBG: No results for input(s): GLUCAP in the last 168 hours. Lipid Profile: No results for input(s):  CHOL, HDL, LDLCALC, TRIG, CHOLHDL, LDLDIRECT in the last 72 hours. Thyroid Function Tests: No results for input(s): TSH, T4TOTAL, FREET4, T3FREE, THYROIDAB in the last 72 hours. Anemia Panel: No results for input(s): VITAMINB12, FOLATE, FERRITIN, TIBC, IRON, RETICCTPCT in the last 72 hours. Urine analysis: No results found for: COLORURINE, APPEARANCEUR, LABSPEC, PHURINE, GLUCOSEU, HGBUR, BILIRUBINUR, KETONESUR, PROTEINUR, UROBILINOGEN, NITRITE, LEUKOCYTESUR Sepsis Labs: No results found for this or any previous visit (from the past 240 hour(s)).   Radiological Exams on Admission: CT Code Stroke CTA Head W/WO contrast  Result Date: 02/04/2020 CLINICAL DATA:  Aphasia. EXAM: CT ANGIOGRAPHY HEAD AND NECK CT PERFUSION BRAIN TECHNIQUE: Multidetector CT imaging of the head and neck was performed using the standard protocol during bolus administration of intravenous contrast. Multiplanar CT image  reconstructions and MIPs were obtained to evaluate the vascular anatomy. Carotid stenosis measurements (when applicable) are obtained utilizing NASCET criteria, using the distal internal carotid diameter as the denominator. Multiphase CT imaging of the brain was performed following IV bolus contrast injection. Subsequent parametric perfusion maps were calculated using RAPID software. CONTRAST:  Dose is currently not known COMPARISON:  Brain MRI 07/06/2005 FINDINGS: CTA NECK FINDINGS Aortic arch: The proximal aorta arch is larger than the descending. No aneurysmal measurement where covered (maximum 37 mm). Right carotid system: Calcified plaque at the bifurcation and proximal ICA without flow limiting stenosis or ulceration. Left carotid system: Calcified plaque at the bifurcation and proximal ICA without flow limiting stenosis, beading, or ulceration. Vertebral arteries: Proximal subclavian atherosclerosis without significant stenosis. Symmetric vertebral arteries with smooth and widely patent lumens to the dura.  Skeleton: Degenerative changes without acute or aggressive finding. Other neck: No significant incidental finding. Upper chest: Negative Review of the MIP images confirms the above findings CTA HEAD FINDINGS Anterior circulation: Atheromatous plaque on the bilateral carotid siphon with right-sided stent spanning cavernous to supraclinoid. No evidence of in stent stenosis. There has been coil embolization of a superior hypophyseal region aneurysm on the right. Hypoplastic left A1 segment. No evident branch occlusion, beading, or filling aneurysm. Posterior circulation: The vertebral and basilar arteries are widely patent. No branch occlusion, beading, or aneurysm. Venous sinuses: Unremarkable Anatomic variants: As above Review of the MIP images confirms the above findings CT Brain Perfusion Findings: ASPECTS: 10 CBF (<30%) Volume: 36mL Perfusion (Tmax>6.0s) volume: 65mL IMPRESSION: CTA head and neck: 1. No emergent finding, including large vessel occlusion. 2. Atherosclerosis without flow limiting stenosis of major vessels in the head and neck. 3. Stent assisted right ICA aneurysm coiling. No evidence of recurrence. CT perfusion: No infarct or penumbra by standard parameters Electronically Signed   By: Monte Fantasia M.D.   On: 02/04/2020 13:48   CT Code Stroke CTA Neck W/WO contrast  Result Date: 02/04/2020 CLINICAL DATA:  Aphasia. EXAM: CT ANGIOGRAPHY HEAD AND NECK CT PERFUSION BRAIN TECHNIQUE: Multidetector CT imaging of the head and neck was performed using the standard protocol during bolus administration of intravenous contrast. Multiplanar CT image reconstructions and MIPs were obtained to evaluate the vascular anatomy. Carotid stenosis measurements (when applicable) are obtained utilizing NASCET criteria, using the distal internal carotid diameter as the denominator. Multiphase CT imaging of the brain was performed following IV bolus contrast injection. Subsequent parametric perfusion maps were calculated  using RAPID software. CONTRAST:  Dose is currently not known COMPARISON:  Brain MRI 07/06/2005 FINDINGS: CTA NECK FINDINGS Aortic arch: The proximal aorta arch is larger than the descending. No aneurysmal measurement where covered (maximum 37 mm). Right carotid system: Calcified plaque at the bifurcation and proximal ICA without flow limiting stenosis or ulceration. Left carotid system: Calcified plaque at the bifurcation and proximal ICA without flow limiting stenosis, beading, or ulceration. Vertebral arteries: Proximal subclavian atherosclerosis without significant stenosis. Symmetric vertebral arteries with smooth and widely patent lumens to the dura. Skeleton: Degenerative changes without acute or aggressive finding. Other neck: No significant incidental finding. Upper chest: Negative Review of the MIP images confirms the above findings CTA HEAD FINDINGS Anterior circulation: Atheromatous plaque on the bilateral carotid siphon with right-sided stent spanning cavernous to supraclinoid. No evidence of in stent stenosis. There has been coil embolization of a superior hypophyseal region aneurysm on the right. Hypoplastic left A1 segment. No evident branch occlusion, beading, or filling aneurysm. Posterior circulation: The vertebral and  basilar arteries are widely patent. No branch occlusion, beading, or aneurysm. Venous sinuses: Unremarkable Anatomic variants: As above Review of the MIP images confirms the above findings CT Brain Perfusion Findings: ASPECTS: 10 CBF (<30%) Volume: 68mL Perfusion (Tmax>6.0s) volume: 64mL IMPRESSION: CTA head and neck: 1. No emergent finding, including large vessel occlusion. 2. Atherosclerosis without flow limiting stenosis of major vessels in the head and neck. 3. Stent assisted right ICA aneurysm coiling. No evidence of recurrence. CT perfusion: No infarct or penumbra by standard parameters Electronically Signed   By: Monte Fantasia M.D.   On: 02/04/2020 13:48   CT Code Stroke  Cerebral Perfusion with contrast  Result Date: 02/04/2020 CLINICAL DATA:  Aphasia. EXAM: CT ANGIOGRAPHY HEAD AND NECK CT PERFUSION BRAIN TECHNIQUE: Multidetector CT imaging of the head and neck was performed using the standard protocol during bolus administration of intravenous contrast. Multiplanar CT image reconstructions and MIPs were obtained to evaluate the vascular anatomy. Carotid stenosis measurements (when applicable) are obtained utilizing NASCET criteria, using the distal internal carotid diameter as the denominator. Multiphase CT imaging of the brain was performed following IV bolus contrast injection. Subsequent parametric perfusion maps were calculated using RAPID software. CONTRAST:  Dose is currently not known COMPARISON:  Brain MRI 07/06/2005 FINDINGS: CTA NECK FINDINGS Aortic arch: The proximal aorta arch is larger than the descending. No aneurysmal measurement where covered (maximum 37 mm). Right carotid system: Calcified plaque at the bifurcation and proximal ICA without flow limiting stenosis or ulceration. Left carotid system: Calcified plaque at the bifurcation and proximal ICA without flow limiting stenosis, beading, or ulceration. Vertebral arteries: Proximal subclavian atherosclerosis without significant stenosis. Symmetric vertebral arteries with smooth and widely patent lumens to the dura. Skeleton: Degenerative changes without acute or aggressive finding. Other neck: No significant incidental finding. Upper chest: Negative Review of the MIP images confirms the above findings CTA HEAD FINDINGS Anterior circulation: Atheromatous plaque on the bilateral carotid siphon with right-sided stent spanning cavernous to supraclinoid. No evidence of in stent stenosis. There has been coil embolization of a superior hypophyseal region aneurysm on the right. Hypoplastic left A1 segment. No evident branch occlusion, beading, or filling aneurysm. Posterior circulation: The vertebral and basilar  arteries are widely patent. No branch occlusion, beading, or aneurysm. Venous sinuses: Unremarkable Anatomic variants: As above Review of the MIP images confirms the above findings CT Brain Perfusion Findings: ASPECTS: 10 CBF (<30%) Volume: 38mL Perfusion (Tmax>6.0s) volume: 16mL IMPRESSION: CTA head and neck: 1. No emergent finding, including large vessel occlusion. 2. Atherosclerosis without flow limiting stenosis of major vessels in the head and neck. 3. Stent assisted right ICA aneurysm coiling. No evidence of recurrence. CT perfusion: No infarct or penumbra by standard parameters Electronically Signed   By: Monte Fantasia M.D.   On: 02/04/2020 13:48   CT HEAD CODE STROKE WO CONTRAST`  Result Date: 02/04/2020 CLINICAL DATA:  Code stroke.  Aphasia. EXAM: CT HEAD WITHOUT CONTRAST TECHNIQUE: Contiguous axial images were obtained from the base of the skull through the vertex without intravenous contrast. COMPARISON:  Brain MRI 07/06/2005 FINDINGS: Brain: No evidence of acute infarction, hemorrhage, obstructive hydrocephalus, extra-axial collection or mass lesion/mass effect. Cerebral volume loss. Ventriculomegaly with disproportionate subarachnoid spaces and some callosal angle narrowing but to a degree not definitive for normal pressure hydrocephalus. Vascular: No asymmetric vessel hyperdensity. Right intracranial ICA stenting. Skull: Normal. Negative for fracture or focal lesion. Sinuses/Orbits: Negative Other: These results were communicated to Dr. Leonel Ramsay at 1:35 pmon 8/22/2021by text page via the  AMION messaging system. ASPECTS [facility] Stroke Program Early CT Score) - Ganglionic level infarction (caudate, lentiform nuclei, internal capsule, insula, M1-M3 cortex): 7 - Supraganglionic infarction (M4-M6 cortex): 3 Total score (0-10 with 10 being normal): 10 IMPRESSION: 1. No acute finding. 2. 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 Electronically Signed    By: Monte Fantasia M.D.   On: 02/04/2020 13:38    EKG: Independently reviewed.  Sinus rhythm at 84 bpm with QT prolonged at 589  Assessment/Plan Aphasia: Acute.  Patient presents after being found to be acutely unable to understand or express what he would like to say this morning.  Last noted to be normal prior to going to bed around 9 PM last night. Initial Imaging studies  did not show any acute abnormalities or large vessel occlusion. Suspectin patient likely had a stroke. -Admit to telemetry bed -Stroke order set initiated -Neuro checks -N.p.o. until able to pass swallow screen -Allow for permissive hypertension systolic blood pressure <211/155 -Check echocardiogram -Check  MRI and EEG -PT/OT/Speech consults -Check Hemoglobin A1c and lipid panel in a.m. -ASA -Social work consult -[place] [facility], will follow-up   Leukocytosis: WBC elevated at 12.2 admission.  -Follow-up urinalysis and chest x-ray  Hyponatremia: Acute.  Sodium level noted to be 127 on admission.  Related with patient's history of alcohol use. -Continue to monitor  Prolonged QT interval: Acute.  QTc prolonged at 589. -Correct any electrolyte abnormalities -Hold any QT prolonging medication  Alcohol use/abuse: Patient wife notes that he normally drinks a couple of cocktails per day on average. -CIWA protocol  History of prostate cancer status post radiation  DVT prophylaxis: Lovenox Code Status: Full Family Communication: Message left on home phone is unable to reach patient's wife Disposition Plan: To be determined Consults called: Neurology Admission status: Observation Norval Morton MD [facility] Pager [phone]   If 7PM-7AM, please contact night-coverage [email or web address] Password [email or web address]  02/04/2020, 2:09 PM

## 2020-02-04 NOTE — Progress Notes (Addendum)
Unable to acquire imaging. Pt moving during exam. IV came while we were changing pt but he kept retracting hand. Attempted to call nurse to notify.

## 2020-02-04 NOTE — ED Notes (Signed)
Pts son called and updated, he will bring mother to see pt. Pt did go to MRI but did was not able to stay still enough for images. Pt does have 2nd IV still in place.  VSS, will contact MD for additional orders.

## 2020-02-04 NOTE — ED Triage Notes (Signed)
Pt bib ems code stroke. LKW yesterday at 2100. Today wife found pt to be altered, aphasic. Only answering yes to all questions/statements. Not moving legs.  170/92, CBG 140. HR 80.

## 2020-02-04 NOTE — Progress Notes (Signed)
EEG was reviewed at the bedside while is being performed with no ongoing seizure activity, left side posterior dominant rhythm appears attenuated, but no epileptiform activity was seen.  unfortunately due to technical difficulty I am unable to review it for formal read at this time.  Formal read to follow.  Roland Rack, MD [facility] [phone]  If 7pm- 7am, please page neurology on call as listed in [place].

## 2020-02-04 NOTE — ED Notes (Signed)
Admitting paged to RN per her request 

## 2020-02-04 NOTE — ED Provider Notes (Addendum)
[facility] EMERGENCY DEPARTMENT Provider Note   CSN: [id number] Arrival date & time: 02/04/20  1321  An emergency department physician performed an initial assessment on this suspected stroke patient at 1320.  History Chief Complaint  Patient presents with  . Code Stroke    Carlos Wise is a 79 y.o. male.  LEVEL 5 caveat due to aphasia and confusion. Code stroke upon arrival. Last known normal at 930 pm, woke up with aphasia and leg weakness. Cant answer question upon arrival due to aphasia.   The history is provided by the EMS personnel.  Neurologic Problem This is a new problem. Nothing aggravates the symptoms. Nothing relieves the symptoms. He has tried nothing for the symptoms. The treatment provided no relief.       Past Medical History:  Diagnosis Date  . Herpes zoster   . Hyperlipemia   . Hypertension   . Intestinal polyp    hyperplastic  . Prostate cancer ([place])   . S/P radiation therapy 12/12/2014 through 02/06/2015    Prostate 7800 cGy in 40 sessions, seminal vesicles 5600 cGy in 40 sessions     Patient Active Problem List   Diagnosis Date Noted  . Malignant neoplasm of prostate ([place]) 07/24/2014    Past Surgical History:  Procedure Laterality Date  . Carotid Artery Catherization    . PROSTATE BIOPSY  06/27/2014       Family History  Problem Relation Age of Onset  . Colon cancer Mother   . Prostate cancer Father   . Heart attack Father     Social History   Tobacco Use  . Smoking status: Former Research scientist (life sciences)  . Smokeless tobacco: Never Used  . Tobacco comment: Quit 30 years ago  Substance Use Topics  . Alcohol use: Yes    Alcohol/week: 0.0 standard drinks    Comment: 3 oz daily  . Drug use: No    Home Medications Prior to Admission medications   Medication Sig Start Date End Date Taking? Authorizing Provider  amLODipine (NORVASC) 5 MG tablet Take 2.5 mg  by mouth daily.     [provider]  aspirin 81 MG tablet Take 81 mg by mouth daily.    [provider]  cholecalciferol (VITAMIN D) 400 UNITS TABS tablet Take 400 Units by mouth. Vitamin D3    [provider]  Cyanocobalamin (VITAMIN B-12 ER PO) Take by mouth.    [provider]  Multiple Vitamins-Minerals (CENTRUM ADULTS PO) Take 1 tablet by mouth daily.    [provider]  pantoprazole (PROTONIX) 40 MG tablet Take 40 mg by mouth daily.    [provider]  simvastatin (ZOCOR) 40 MG tablet Take 20 mg by mouth daily.     [provider]  tamsulosin (FLOMAX) 0.4 MG CAPS capsule Take 1 capsule (0.4 mg total) by mouth daily. 01/11/15   Carlos Rudd, MD    Allergies    Penicillins  Review of Systems   Review of Systems  Unable to perform ROS: Acuity of condition    Physical Exam Updated Vital Signs There were no vitals taken for this visit.  Physical Exam Vitals and nursing note reviewed.  Constitutional:      General: He is not in acute distress.    Appearance: He is well-developed. He is not ill-appearing.  HENT:     Head: Normocephalic and atraumatic.     Nose: Nose normal.     Mouth/Throat:     Mouth: Mucous membranes are  moist.  Eyes:     Extraocular Movements: Extraocular movements intact.     Conjunctiva/sclera: Conjunctivae normal.     Pupils: Pupils are equal, round, and reactive to light.  Cardiovascular:     Rate and Rhythm: Normal rate and regular rhythm.     Pulses: Normal pulses.     Heart sounds: Normal heart sounds. No murmur heard.   Pulmonary:     Effort: Pulmonary effort is normal. No respiratory distress.     Breath sounds: Normal breath sounds.  Abdominal:     Palpations: Abdomen is soft.     Tenderness: There is no abdominal tenderness.  Musculoskeletal:     Cervical back: Normal range of motion and neck supple.  Skin:    General: Skin is warm and dry.  Neurological:     Mental Status:  He is alert. He is disoriented.     Comments: Aphasic, does not follow commands well, appears to move all extremities, no obvious facial droop   Psychiatric:        Mood and Affect: Mood normal.     ED Results / Procedures / Treatments   Labs (all labs ordered are listed, but only abnormal results are displayed) Labs Reviewed  CBC - Abnormal; Notable for the following components:      Result Value   WBC 12.2 (*)    All other components within normal limits  DIFFERENTIAL - Abnormal; Notable for the following components:   Neutro Abs 10.6 (*)    All other components within normal limits  I-STAT CHEM 8, ED - Abnormal; Notable for the following components:   Sodium 128 (*)    Chloride 94 (*)    Glucose, Bld 156 (*)    Calcium, Ion 1.10 (*)    All other components within normal limits  SARS CORONAVIRUS 2 BY RT PCR (HOSPITAL ORDER, [facility])  APTT  COMPREHENSIVE METABOLIC PANEL  RAPID URINE DRUG SCREEN, HOSP PERFORMED  URINALYSIS, ROUTINE W REFLEX MICROSCOPIC  ETHANOL    EKG None  Radiology CT Code Stroke CTA Head W/WO contrast  Result Date: 02/04/2020 CLINICAL DATA:  Aphasia. EXAM: CT ANGIOGRAPHY HEAD AND NECK CT PERFUSION BRAIN TECHNIQUE: Multidetector CT imaging of the head and neck was performed using the standard protocol during bolus administration of intravenous contrast. Multiplanar CT image reconstructions and MIPs were obtained to evaluate the vascular anatomy. Carotid stenosis measurements (when applicable) are obtained utilizing NASCET criteria, using the distal internal carotid diameter as the denominator. Multiphase CT imaging of the brain was performed following IV bolus contrast injection. Subsequent parametric perfusion maps were calculated using RAPID software. CONTRAST:  Dose is currently not known COMPARISON:  Brain MRI 07/06/2005 FINDINGS: CTA NECK FINDINGS Aortic arch: The proximal aorta arch is larger than the descending. No  aneurysmal measurement where covered (maximum 37 mm). Right carotid system: Calcified plaque at the bifurcation and proximal ICA without flow limiting stenosis or ulceration. Left carotid system: Calcified plaque at the bifurcation and proximal ICA without flow limiting stenosis, beading, or ulceration. Vertebral arteries: Proximal subclavian atherosclerosis without significant stenosis. Symmetric vertebral arteries with smooth and widely patent lumens to the dura. Skeleton: Degenerative changes without acute or aggressive finding. Other neck: No significant incidental finding. Upper chest: Negative Review of the MIP images confirms the above findings CTA HEAD FINDINGS Anterior circulation: Atheromatous plaque on the bilateral carotid siphon with right-sided stent spanning cavernous to supraclinoid. No evidence of in stent stenosis. There has been coil embolization  of a superior hypophyseal region aneurysm on the right. Hypoplastic left A1 segment. No evident branch occlusion, beading, or filling aneurysm. Posterior circulation: The vertebral and basilar arteries are widely patent. No branch occlusion, beading, or aneurysm. Venous sinuses: Unremarkable Anatomic variants: As above Review of the MIP images confirms the above findings CT Brain Perfusion Findings: ASPECTS: 10 CBF (<30%) Volume: 66mL Perfusion (Tmax>6.0s) volume: 80mL IMPRESSION: CTA head and neck: 1. No emergent finding, including large vessel occlusion. 2. Atherosclerosis without flow limiting stenosis of major vessels in the head and neck. 3. Stent assisted right ICA aneurysm coiling. No evidence of recurrence. CT perfusion: No infarct or penumbra by standard parameters Electronically Signed   By: Monte Fantasia M.D.   On: 02/04/2020 13:48   CT Code Stroke CTA Neck W/WO contrast  Result Date: 02/04/2020 CLINICAL DATA:  Aphasia. EXAM: CT ANGIOGRAPHY HEAD AND NECK CT PERFUSION BRAIN TECHNIQUE: Multidetector CT imaging of the head and neck was  performed using the standard protocol during bolus administration of intravenous contrast. Multiplanar CT image reconstructions and MIPs were obtained to evaluate the vascular anatomy. Carotid stenosis measurements (when applicable) are obtained utilizing NASCET criteria, using the distal internal carotid diameter as the denominator. Multiphase CT imaging of the brain was performed following IV bolus contrast injection. Subsequent parametric perfusion maps were calculated using RAPID software. CONTRAST:  Dose is currently not known COMPARISON:  Brain MRI 07/06/2005 FINDINGS: CTA NECK FINDINGS Aortic arch: The proximal aorta arch is larger than the descending. No aneurysmal measurement where covered (maximum 37 mm). Right carotid system: Calcified plaque at the bifurcation and proximal ICA without flow limiting stenosis or ulceration. Left carotid system: Calcified plaque at the bifurcation and proximal ICA without flow limiting stenosis, beading, or ulceration. Vertebral arteries: Proximal subclavian atherosclerosis without significant stenosis. Symmetric vertebral arteries with smooth and widely patent lumens to the dura. Skeleton: Degenerative changes without acute or aggressive finding. Other neck: No significant incidental finding. Upper chest: Negative Review of the MIP images confirms the above findings CTA HEAD FINDINGS Anterior circulation: Atheromatous plaque on the bilateral carotid siphon with right-sided stent spanning cavernous to supraclinoid. No evidence of in stent stenosis. There has been coil embolization of a superior hypophyseal region aneurysm on the right. Hypoplastic left A1 segment. No evident branch occlusion, beading, or filling aneurysm. Posterior circulation: The vertebral and basilar arteries are widely patent. No branch occlusion, beading, or aneurysm. Venous sinuses: Unremarkable Anatomic variants: As above Review of the MIP images confirms the above findings CT Brain Perfusion  Findings: ASPECTS: 10 CBF (<30%) Volume: 51mL Perfusion (Tmax>6.0s) volume: 43mL IMPRESSION: CTA head and neck: 1. No emergent finding, including large vessel occlusion. 2. Atherosclerosis without flow limiting stenosis of major vessels in the head and neck. 3. Stent assisted right ICA aneurysm coiling. No evidence of recurrence. CT perfusion: No infarct or penumbra by standard parameters Electronically Signed   By: Monte Fantasia M.D.   On: 02/04/2020 13:48   CT Code Stroke Cerebral Perfusion with contrast  Result Date: 02/04/2020 CLINICAL DATA:  Aphasia. EXAM: CT ANGIOGRAPHY HEAD AND NECK CT PERFUSION BRAIN TECHNIQUE: Multidetector CT imaging of the head and neck was performed using the standard protocol during bolus administration of intravenous contrast. Multiplanar CT image reconstructions and MIPs were obtained to evaluate the vascular anatomy. Carotid stenosis measurements (when applicable) are obtained utilizing NASCET criteria, using the distal internal carotid diameter as the denominator. Multiphase CT imaging of the brain was performed following IV bolus contrast injection. Subsequent parametric perfusion  maps were calculated using RAPID software. CONTRAST:  Dose is currently not known COMPARISON:  Brain MRI 07/06/2005 FINDINGS: CTA NECK FINDINGS Aortic arch: The proximal aorta arch is larger than the descending. No aneurysmal measurement where covered (maximum 37 mm). Right carotid system: Calcified plaque at the bifurcation and proximal ICA without flow limiting stenosis or ulceration. Left carotid system: Calcified plaque at the bifurcation and proximal ICA without flow limiting stenosis, beading, or ulceration. Vertebral arteries: Proximal subclavian atherosclerosis without significant stenosis. Symmetric vertebral arteries with smooth and widely patent lumens to the dura. Skeleton: Degenerative changes without acute or aggressive finding. Other neck: No significant incidental finding. Upper chest:  Negative Review of the MIP images confirms the above findings CTA HEAD FINDINGS Anterior circulation: Atheromatous plaque on the bilateral carotid siphon with right-sided stent spanning cavernous to supraclinoid. No evidence of in stent stenosis. There has been coil embolization of a superior hypophyseal region aneurysm on the right. Hypoplastic left A1 segment. No evident branch occlusion, beading, or filling aneurysm. Posterior circulation: The vertebral and basilar arteries are widely patent. No branch occlusion, beading, or aneurysm. Venous sinuses: Unremarkable Anatomic variants: As above Review of the MIP images confirms the above findings CT Brain Perfusion Findings: ASPECTS: 10 CBF (<30%) Volume: 65mL Perfusion (Tmax>6.0s) volume: 24mL IMPRESSION: CTA head and neck: 1. No emergent finding, including large vessel occlusion. 2. Atherosclerosis without flow limiting stenosis of major vessels in the head and neck. 3. Stent assisted right ICA aneurysm coiling. No evidence of recurrence. CT perfusion: No infarct or penumbra by standard parameters Electronically Signed   By: Monte Fantasia M.D.   On: 02/04/2020 13:48   CT HEAD CODE STROKE WO CONTRAST`  Result Date: 02/04/2020 CLINICAL DATA:  Code stroke.  Aphasia. EXAM: CT HEAD WITHOUT CONTRAST TECHNIQUE: Contiguous axial images were obtained from the base of the skull through the vertex without intravenous contrast. COMPARISON:  Brain MRI 07/06/2005 FINDINGS: Brain: No evidence of acute infarction, hemorrhage, obstructive hydrocephalus, extra-axial collection or mass lesion/mass effect. Cerebral volume loss. Ventriculomegaly with disproportionate subarachnoid spaces and some callosal angle narrowing but to a degree not definitive for normal pressure hydrocephalus. Vascular: No asymmetric vessel hyperdensity. Right intracranial ICA stenting. Skull: Normal. Negative for fracture or focal lesion. Sinuses/Orbits: Negative Other: These results were communicated to  Dr. Leonel Ramsay at 1:35 pmon 8/22/2021by text page via the [facility] messaging system. ASPECTS [facility] Stroke Program Early CT Score) - Ganglionic level infarction (caudate, lentiform nuclei, internal capsule, insula, M1-M3 cortex): 7 - Supraganglionic infarction (M4-M6 cortex): 3 Total score (0-10 with 10 being normal): 10 IMPRESSION: 1. No acute finding. 2. 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 Electronically Signed   By: Monte Fantasia M.D.   On: 02/04/2020 13:38    Procedures .Critical Care Performed by: Lennice Sites, DO Authorized by: Lennice Sites, DO   Critical care provider statement:    Critical care time (minutes):  40   Critical care was necessary to treat or prevent imminent or life-threatening deterioration of the following conditions:  CNS failure or compromise   Critical care was time spent personally by me on the following activities:  Blood draw for specimens, development of treatment plan with patient or surrogate, discussions with primary provider, discussions with consultants, evaluation of patient's response to treatment, examination of patient, obtaining history from patient or surrogate, ordering and performing treatments and interventions, ordering and review of laboratory studies, ordering and review of radiographic studies, pulse oximetry, re-evaluation of patient's condition and  review of old charts   I assumed direction of critical care for this patient from another provider in my specialty: no     (including critical care time)  Medications Ordered in ED Medications  aspirin suppository 300 mg (has no administration in time range)    ED Course  I have reviewed the triage vital signs and the nursing notes.  Pertinent labs & imaging results that were available during my care of the patient were reviewed by me and considered in my medical decision making (see chart for details).    MDM Rules/Calculators/A&P                           TAYVEN RENTERIA is a 79 year old male with history of hypertension, high cholesterol, prostate cancer status post radiation treatment, history of ICA disease status post coiling who presents to the ED with strokelike symptoms with EMS. Last known normal was last night at 9:30 PM. Patient woke up aphasic, confused, possibly not moving his lower extremities. Patient arrives with airway, breathing, circulation intact. He is aphasic but appears to be moving all extremities. He does not follow commands very well and he does not make much sense when he talks. Neurology at the bedside upon arrival and went directly for CT scan. CT imaging showed bleeding, no large vessel occlusion. Patient is not a candidate for TPA and does not appear to be a candidate for thrombectomy. Overall suspicion is high for stroke and patient to be given aspirin and to be admitted for further stroke care. Will order MRI. Lab work shows no significant anemia, electrolyte abnormality, kidney injury other than a sodium of 127. To be admitted to medicine for further stroke care.  This chart was dictated using voice recognition software.  Despite best efforts to proofread,  errors can occur which can change the documentation meaning.    Final Clinical Impression(s) / ED Diagnoses Final diagnoses:  Aphasia  Stroke-like symptoms    Rx / DC Orders ED Discharge Orders    None       Lennice Sites, DO 02/04/20 [place], [place], DO 02/04/20 1409

## 2020-02-05 ENCOUNTER — Other Ambulatory Visit: Payer: Self-pay

## 2020-02-05 ENCOUNTER — Observation Stay (HOSPITAL_COMMUNITY): Payer: Medicare HMO

## 2020-02-05 DIAGNOSIS — R569 Unspecified convulsions: Secondary | ICD-10-CM | POA: Diagnosis not present

## 2020-02-05 DIAGNOSIS — E782 Mixed hyperlipidemia: Secondary | ICD-10-CM | POA: Diagnosis not present

## 2020-02-05 DIAGNOSIS — R2990 Unspecified symptoms and signs involving the nervous system: Secondary | ICD-10-CM | POA: Diagnosis not present

## 2020-02-05 DIAGNOSIS — R4701 Aphasia: Secondary | ICD-10-CM | POA: Diagnosis not present

## 2020-02-05 LAB — URINALYSIS, ROUTINE W REFLEX MICROSCOPIC
Bilirubin Urine: NEGATIVE
Glucose, UA: NEGATIVE mg/dL
Hgb urine dipstick: NEGATIVE
Ketones, ur: 20 mg/dL — AB
Leukocytes,Ua: NEGATIVE
Nitrite: NEGATIVE
Protein, ur: 30 mg/dL — AB
Specific Gravity, Urine: 1.031 — ABNORMAL HIGH (ref 1.005–1.030)
pH: 5 (ref 5.0–8.0)

## 2020-02-05 LAB — RAPID URINE DRUG SCREEN, HOSP PERFORMED
Amphetamines: NOT DETECTED
Barbiturates: NOT DETECTED
Benzodiazepines: POSITIVE — AB
Cocaine: NOT DETECTED
Opiates: NOT DETECTED
Tetrahydrocannabinol: NOT DETECTED

## 2020-02-05 LAB — CBC
HCT: 41.6 % (ref 39.0–52.0)
Hemoglobin: 14.7 g/dL (ref 13.0–17.0)
MCH: 32.9 pg (ref 26.0–34.0)
MCHC: 35.3 g/dL (ref 30.0–36.0)
MCV: 93.1 fL (ref 80.0–100.0)
Platelets: 261 10*3/uL (ref 150–400)
RBC: 4.47 MIL/uL (ref 4.22–5.81)
RDW: 12.1 % (ref 11.5–15.5)
WBC: 11.8 10*3/uL — ABNORMAL HIGH (ref 4.0–10.5)
nRBC: 0 % (ref 0.0–0.2)

## 2020-02-05 LAB — COMPREHENSIVE METABOLIC PANEL
ALT: 16 U/L (ref 0–44)
AST: 28 U/L (ref 15–41)
Albumin: 4.1 g/dL (ref 3.5–5.0)
Alkaline Phosphatase: 55 U/L (ref 38–126)
Anion gap: 13 (ref 5–15)
BUN: 14 mg/dL (ref 8–23)
CO2: 23 mmol/L (ref 22–32)
Calcium: 9.6 mg/dL (ref 8.9–10.3)
Chloride: 95 mmol/L — ABNORMAL LOW (ref 98–111)
Creatinine, Ser: 0.88 mg/dL (ref 0.61–1.24)
GFR calc Af Amer: >60 mL/min (ref >60)
GFR calc non Af Amer: >60 mL/min (ref >60)
Glucose, Bld: 113 mg/dL — ABNORMAL HIGH (ref 70–99)
Potassium: 3.8 mmol/L (ref 3.5–5.1)
Sodium: 131 mmol/L — ABNORMAL LOW (ref 135–145)
Total Bilirubin: 1.9 mg/dL — ABNORMAL HIGH (ref 0.3–1.2)
Total Protein: 7.4 g/dL (ref 6.5–8.1)

## 2020-02-05 LAB — CBG MONITORING, ED: Glucose-Capillary: 165 mg/dL — ABNORMAL HIGH (ref 70–99)

## 2020-02-05 MED ORDER — FLUTICASONE PROPIONATE 50 MCG/ACT NA SUSP
2.0000 | Freq: Every day | NASAL | Status: DC | PRN
  Filled 2020-02-05: qty 16

## 2020-02-05 MED ORDER — SENNOSIDES-DOCUSATE SODIUM 8.6-50 MG PO TABS: 2.0000 | ORAL_TABLET | Freq: Every evening | ORAL | Status: DC | PRN

## 2020-02-05 MED ORDER — GABAPENTIN 300 MG PO CAPS
300.0000 mg | ORAL_CAPSULE | Freq: Two times a day (BID) | ORAL | Status: DC
  Administered 2020-02-06 – 2020-02-09 (×7): 300 mg via ORAL
  Filled 2020-02-05 (×7): qty 1

## 2020-02-05 MED ORDER — PANTOPRAZOLE SODIUM 40 MG PO TBEC
40.0000 mg | DELAYED_RELEASE_TABLET | Freq: Every day | ORAL | Status: DC
  Administered 2020-02-07 – 2020-02-09 (×3): 40 mg via ORAL
  Filled 2020-02-05 (×4): qty 1

## 2020-02-05 MED ORDER — SODIUM CHLORIDE 0.9 % IV SOLN
2000.0000 mg | Freq: Once | INTRAVENOUS | Status: AC
  Administered 2020-02-05: 2000 mg via INTRAVENOUS
  Filled 2020-02-05: qty 20

## 2020-02-05 MED ORDER — LEVETIRACETAM IN NACL 500 MG/100ML IV SOLN
500.0000 mg | Freq: Two times a day (BID) | INTRAVENOUS | Status: DC
  Administered 2020-02-06 – 2020-02-08 (×6): 500 mg via INTRAVENOUS
  Filled 2020-02-05 (×7): qty 100

## 2020-02-05 MED ORDER — ATORVASTATIN CALCIUM 80 MG PO TABS
80.0000 mg | ORAL_TABLET | Freq: Every day | ORAL | Status: DC
  Administered 2020-02-06 – 2020-02-09 (×4): 80 mg via ORAL
  Filled 2020-02-05 (×4): qty 1

## 2020-02-05 MED ORDER — POLYETHYLENE GLYCOL 3350 17 G PO PACK: 17.0000 g | PACK | Freq: Every day | ORAL | Status: DC | PRN

## 2020-02-05 NOTE — ED Notes (Signed)
Patient sleeping

## 2020-02-05 NOTE — Progress Notes (Signed)
STROKE TEAM PROGRESS NOTE   INTERVAL HISTORY Wife at bedside. Pt lying in bed, initially sleeping, but arousable with voiced but needed repetitive stimulation to keep eyes open. Pt still has severe global aphasia, however, current stroke work up all negative. EEG formal read pending but no seizure reported. No fever, UA, CXR neg. Slight elevated WBC but no sign of CNS infection.   Vitals:   02/04/20 1530 02/04/20 1830 02/04/20 1900 02/05/20 0612  BP: (!) 173/88 (!) 177/88 (!) 128/93 (!) 159/86  Pulse: 88 91 96 76  Resp:  15 17 18   Temp:    99.9 F (37.7 C)  TempSrc:    Oral  SpO2: 95% 96% 96% 94%   CBC:  Recent Labs  Lab 02/04/20 1319 02/04/20 1319 02/04/20 1328 02/05/20 0634  WBC 12.2*  --   --  11.8*  NEUTROABS 10.6*  --   --   --   HGB 15.2   < > 15.3 14.7  HCT 43.5   < > 45.0 41.6  MCV 91.6  --   --  93.1  PLT 231  --   --  261   < > = values in this interval not displayed.   Basic Metabolic Panel:  Recent Labs  Lab 02/04/20 1319 02/04/20 1319 02/04/20 1328 02/04/20 1419 02/05/20 0634  NA 127*   < > 128*  --  131*  K 4.2   < > 4.2  --  3.8  CL 92*   < > 94*  --  95*  CO2 21*  --   --   --  23  GLUCOSE 154*   < > 156*  --  113*  BUN 10   < > 10  --  14  CREATININE 0.90   < > 0.80  --  0.88  CALCIUM 9.5  --   --   --  9.6  MG  --   --   --  1.9  --   PHOS  --   --   --  3.6  --    < > = values in this interval not displayed.   Lipid Panel:  Recent Labs  Lab 02/04/20 1319  CHOL 274*  TRIG 98  HDL 61  CHOLHDL 4.5  VLDL 20  LDLCALC 193*   HgbA1c:  Recent Labs  Lab 02/04/20 1419  HGBA1C 5.6   Urine Drug Screen:  Recent Labs  Lab 02/05/20 1045  LABOPIA NONE DETECTED  COCAINSCRNUR NONE DETECTED  LABBENZ POSITIVE*  AMPHETMU NONE DETECTED  THCU NONE DETECTED  LABBARB NONE DETECTED    Alcohol Level  Recent Labs  Lab 02/04/20 1319  ETH <10    IMAGING past 24 hours CT Code Stroke CTA Head W/WO contrast  Result Date: 02/04/2020 CLINICAL  DATA:  Aphasia. EXAM: CT ANGIOGRAPHY HEAD AND NECK CT PERFUSION BRAIN TECHNIQUE: Multidetector CT imaging of the head and neck was performed using the standard protocol during bolus administration of intravenous contrast. Multiplanar CT image reconstructions and MIPs were obtained to evaluate the vascular anatomy. Carotid stenosis measurements (when applicable) are obtained utilizing NASCET criteria, using the distal internal carotid diameter as the denominator. Multiphase CT imaging of the brain was performed following IV bolus contrast injection. Subsequent parametric perfusion maps were calculated using RAPID software. CONTRAST:  Dose is currently not known COMPARISON:  Brain MRI 07/06/2005 FINDINGS: CTA NECK FINDINGS Aortic arch: The proximal aorta arch is larger than the descending. No aneurysmal measurement where covered (maximum 37 mm).  Right carotid system: Calcified plaque at the bifurcation and proximal ICA without flow limiting stenosis or ulceration. Left carotid system: Calcified plaque at the bifurcation and proximal ICA without flow limiting stenosis, beading, or ulceration. Vertebral arteries: Proximal subclavian atherosclerosis without significant stenosis. Symmetric vertebral arteries with smooth and widely patent lumens to the dura. Skeleton: Degenerative changes without acute or aggressive finding. Other neck: No significant incidental finding. Upper chest: Negative Review of the MIP images confirms the above findings CTA HEAD FINDINGS Anterior circulation: Atheromatous plaque on the bilateral carotid siphon with right-sided stent spanning cavernous to supraclinoid. No evidence of in stent stenosis. There has been coil embolization of a superior hypophyseal region aneurysm on the right. Hypoplastic left A1 segment. No evident branch occlusion, beading, or filling aneurysm. Posterior circulation: The vertebral and basilar arteries are widely patent. No branch occlusion, beading, or aneurysm.  Venous sinuses: Unremarkable Anatomic variants: As above Review of the MIP images confirms the above findings CT Brain Perfusion Findings: ASPECTS: 10 CBF (<30%) Volume: 86mL Perfusion (Tmax>6.0s) volume: 60mL IMPRESSION: CTA head and neck: 1. No emergent finding, including large vessel occlusion. 2. Atherosclerosis without flow limiting stenosis of major vessels in the head and neck. 3. Stent assisted right ICA aneurysm coiling. No evidence of recurrence. CT perfusion: No infarct or penumbra by standard parameters Electronically Signed   By: Monte Fantasia M.D.   On: 02/04/2020 13:48   DG Chest 2 View  Result Date: 02/04/2020 CLINICAL DATA:  Aphasia EXAM: CHEST - 2 VIEW COMPARISON:  Report from March of 2017 FINDINGS: Trachea is midline. Cardiomediastinal contours and hilar structures are normal. Multiple leads project over the chest. Lungs are clear.  No sign of pleural effusion. On limited assessment skeletal structures are unremarkable. IMPRESSION: No acute cardiopulmonary disease. Electronically Signed   By: Zetta Bills M.D.   On: 02/04/2020 15:27   CT Code Stroke CTA Neck W/WO contrast  Result Date: 02/04/2020 CLINICAL DATA:  Aphasia. EXAM: CT ANGIOGRAPHY HEAD AND NECK CT PERFUSION BRAIN TECHNIQUE: Multidetector CT imaging of the head and neck was performed using the standard protocol during bolus administration of intravenous contrast. Multiplanar CT image reconstructions and MIPs were obtained to evaluate the vascular anatomy. Carotid stenosis measurements (when applicable) are obtained utilizing NASCET criteria, using the distal internal carotid diameter as the denominator. Multiphase CT imaging of the brain was performed following IV bolus contrast injection. Subsequent parametric perfusion maps were calculated using RAPID software. CONTRAST:  Dose is currently not known COMPARISON:  Brain MRI 07/06/2005 FINDINGS: CTA NECK FINDINGS Aortic arch: The proximal aorta arch is larger than the  descending. No aneurysmal measurement where covered (maximum 37 mm). Right carotid system: Calcified plaque at the bifurcation and proximal ICA without flow limiting stenosis or ulceration. Left carotid system: Calcified plaque at the bifurcation and proximal ICA without flow limiting stenosis, beading, or ulceration. Vertebral arteries: Proximal subclavian atherosclerosis without significant stenosis. Symmetric vertebral arteries with smooth and widely patent lumens to the dura. Skeleton: Degenerative changes without acute or aggressive finding. Other neck: No significant incidental finding. Upper chest: Negative Review of the MIP images confirms the above findings CTA HEAD FINDINGS Anterior circulation: Atheromatous plaque on the bilateral carotid siphon with right-sided stent spanning cavernous to supraclinoid. No evidence of in stent stenosis. There has been coil embolization of a superior hypophyseal region aneurysm on the right. Hypoplastic left A1 segment. No evident branch occlusion, beading, or filling aneurysm. Posterior circulation: The vertebral and basilar arteries are widely patent. No branch occlusion,  beading, or aneurysm. Venous sinuses: Unremarkable Anatomic variants: As above Review of the MIP images confirms the above findings CT Brain Perfusion Findings: ASPECTS: 10 CBF (<30%) Volume: 27mL Perfusion (Tmax>6.0s) volume: 23mL IMPRESSION: CTA head and neck: 1. No emergent finding, including large vessel occlusion. 2. Atherosclerosis without flow limiting stenosis of major vessels in the head and neck. 3. Stent assisted right ICA aneurysm coiling. No evidence of recurrence. CT perfusion: No infarct or penumbra by standard parameters Electronically Signed   By: Monte Fantasia M.D.   On: 02/04/2020 13:48   MR Brain Wo Contrast (neuro protocol)  Result Date: 02/04/2020 CLINICAL DATA:  Suspected stroke.  Aphasia. EXAM: MRI HEAD WITHOUT CONTRAST TECHNIQUE: Multiplanar, multiecho pulse sequences of the  brain and surrounding structures were obtained without intravenous contrast. COMPARISON:  Head CT 02/04/2020 FINDINGS: Examination degraded by motion. BRAIN: No acute infarct, acute hemorrhage or extra-axial collection. Multifocal white matter hyperintensity, most commonly due to chronic ischemic microangiopathy. Advanced parenchymal volume loss. No chronic microhemorrhage. Normal midline structures. VASCULAR: Major flow voids are preserved. SKULL AND UPPER CERVICAL SPINE: Normal calvarium and skull base. Visualized upper cervical spine and soft tissues are normal. SINUSES/ORBITS: No paranasal sinus fluid levels or advanced mucosal thickening. No mastoid or middle ear effusion. Normal orbits. IMPRESSION: 1. Motion degraded examination. 2. No acute intracranial process. 3. Advanced parenchymal volume loss and findings of chronic small vessel disease. Electronically Signed   By: Ulyses Jarred M.D.   On: 02/04/2020 21:38   CT Code Stroke Cerebral Perfusion with contrast  Result Date: 02/04/2020 CLINICAL DATA:  Aphasia. EXAM: CT ANGIOGRAPHY HEAD AND NECK CT PERFUSION BRAIN TECHNIQUE: Multidetector CT imaging of the head and neck was performed using the standard protocol during bolus administration of intravenous contrast. Multiplanar CT image reconstructions and MIPs were obtained to evaluate the vascular anatomy. Carotid stenosis measurements (when applicable) are obtained utilizing NASCET criteria, using the distal internal carotid diameter as the denominator. Multiphase CT imaging of the brain was performed following IV bolus contrast injection. Subsequent parametric perfusion maps were calculated using RAPID software. CONTRAST:  Dose is currently not known COMPARISON:  Brain MRI 07/06/2005 FINDINGS: CTA NECK FINDINGS Aortic arch: The proximal aorta arch is larger than the descending. No aneurysmal measurement where covered (maximum 37 mm). Right carotid system: Calcified plaque at the bifurcation and proximal  ICA without flow limiting stenosis or ulceration. Left carotid system: Calcified plaque at the bifurcation and proximal ICA without flow limiting stenosis, beading, or ulceration. Vertebral arteries: Proximal subclavian atherosclerosis without significant stenosis. Symmetric vertebral arteries with smooth and widely patent lumens to the dura. Skeleton: Degenerative changes without acute or aggressive finding. Other neck: No significant incidental finding. Upper chest: Negative Review of the MIP images confirms the above findings CTA HEAD FINDINGS Anterior circulation: Atheromatous plaque on the bilateral carotid siphon with right-sided stent spanning cavernous to supraclinoid. No evidence of in stent stenosis. There has been coil embolization of a superior hypophyseal region aneurysm on the right. Hypoplastic left A1 segment. No evident branch occlusion, beading, or filling aneurysm. Posterior circulation: The vertebral and basilar arteries are widely patent. No branch occlusion, beading, or aneurysm. Venous sinuses: Unremarkable Anatomic variants: As above Review of the MIP images confirms the above findings CT Brain Perfusion Findings: ASPECTS: 10 CBF (<30%) Volume: 64mL Perfusion (Tmax>6.0s) volume: 87mL IMPRESSION: CTA head and neck: 1. No emergent finding, including large vessel occlusion. 2. Atherosclerosis without flow limiting stenosis of major vessels in the head and neck. 3. Stent assisted right  ICA aneurysm coiling. No evidence of recurrence. CT perfusion: No infarct or penumbra by standard parameters Electronically Signed   By: Monte Fantasia M.D.   On: 02/04/2020 13:48   ECHOCARDIOGRAM COMPLETE  Result Date: 02/04/2020    ECHOCARDIOGRAM REPORT   Patient Name:   NATHIN SARAN Date of Exam: 02/04/2020 Medical Rec #:  [id number]    Height:       67.1 in Accession #:    [id number]   Weight:       196.8 lb Date of Birth:  11-Apr-1941    BSA:          2.011 m Patient Age:    64 years     BP:           167/96  mmHg Patient Gender: M            HR:           86 bpm. Exam Location:  Inpatient Procedure: 2D Echo, Cardiac Doppler and Color Doppler Indications:    Stroke 434.91/I163.9  History:        Patient has no prior history of Echocardiogram examinations.  Sonographer:    Merrie Roof RDCS Referring Phys: [id number] RONDELL A SMITH  Sonographer Comments: Poor compliance secondary to altered mental status due to stroke. IMPRESSIONS  1. Left ventricular ejection fraction, by estimation, is 60 to 65%. The left ventricle has normal function. The left ventricle has no regional wall motion abnormalities. There is mild concentric left ventricular hypertrophy. Left ventricular diastolic parameters are consistent with Grade I diastolic dysfunction (impaired relaxation).  2. Right ventricular systolic function is normal. The right ventricular size is normal. There is normal pulmonary artery systolic pressure.  3. The mitral valve is normal in structure. No evidence of mitral valve regurgitation. No evidence of mitral stenosis.  4. The aortic valve is normal in structure. Aortic valve regurgitation is not visualized. No aortic stenosis is present.  5. The inferior vena cava is normal in size with greater than 50% respiratory variability, suggesting right atrial pressure of 3 mmHg. Conclusion(s)/Recommendation(s): No intracardiac source of embolism detected on this transthoracic study. A transesophageal echocardiogram is recommended to exclude cardiac source of embolism if clinically indicated. FINDINGS  Left Ventricle: Left ventricular ejection fraction, by estimation, is 60 to 65%. The left ventricle has normal function. The left ventricle has no regional wall motion abnormalities. The left ventricular internal cavity size was normal in size. There is  mild concentric left ventricular hypertrophy. Left ventricular diastolic parameters are consistent with Grade I diastolic dysfunction (impaired relaxation). Normal left ventricular  filling pressure. Right Ventricle: The right ventricular size is normal. No increase in right ventricular wall thickness. Right ventricular systolic function is normal. There is normal pulmonary artery systolic pressure. Left Atrium: Left atrial size was normal in size. Right Atrium: Right atrial size was normal in size. Pericardium: There is no evidence of pericardial effusion. Mitral Valve: The mitral valve is normal in structure. Normal mobility of the mitral valve leaflets. No evidence of mitral valve regurgitation. No evidence of mitral valve stenosis. Tricuspid Valve: The tricuspid valve is normal in structure. Tricuspid valve regurgitation is trivial. No evidence of tricuspid stenosis. Aortic Valve: The aortic valve is normal in structure. Aortic valve regurgitation is not visualized. No aortic stenosis is present. Pulmonic Valve: The pulmonic valve was normal in structure. Pulmonic valve regurgitation is not visualized. No evidence of pulmonic stenosis. Aorta: The aortic root is normal in size and structure. Venous: The  inferior vena cava is normal in size with greater than 50% respiratory variability, suggesting right atrial pressure of 3 mmHg. IAS/Shunts: No atrial level shunt detected by color flow Doppler.  LEFT VENTRICLE PLAX 2D LVIDd:         4.20 cm     Diastology LVIDs:         2.80 cm     LV e' lateral:   8.49 cm/s LV PW:         1.20 cm     LV E/e' lateral: 7.9 LV IVS:        1.10 cm     LV e' medial:    7.40 cm/s LVOT diam:     2.00 cm     LV E/e' medial:  9.1 LV SV:         74 LV SV Index:   37 LVOT Area:     3.14 cm  LV Volumes (MOD) LV vol d, MOD A4C: 63.4 ml LV vol s, MOD A4C: 24.4 ml LV SV MOD A4C:     63.4 ml LEFT ATRIUM           Index LA diam:      3.70 cm 1.84 cm/m LA Vol (A4C): 59.4 ml 29.54 ml/m  AORTIC VALVE LVOT Vmax:   122.00 cm/s LVOT Vmean:  82.300 cm/s LVOT VTI:    0.237 m  AORTA Ao Root diam: 3.50 cm MITRAL VALVE MV Area (PHT): 3.34 cm     SHUNTS MV Decel Time: 227 msec      Systemic VTI:  0.24 m MV E velocity: 67.30 cm/s   Systemic Diam: 2.00 cm MV A velocity: 113.00 cm/s MV E/A ratio:  0.60 Ena Dawley MD Electronically signed by Ena Dawley MD Signature Date/Time: 02/04/2020/4:28:09 PM    Final    CT HEAD CODE STROKE WO CONTRAST`  Result Date: 02/04/2020 CLINICAL DATA:  Code stroke.  Aphasia. EXAM: CT HEAD WITHOUT CONTRAST TECHNIQUE: Contiguous axial images were obtained from the base of the skull through the vertex without intravenous contrast. COMPARISON:  Brain MRI 07/06/2005 FINDINGS: Brain: No evidence of acute infarction, hemorrhage, obstructive hydrocephalus, extra-axial collection or mass lesion/mass effect. Cerebral volume loss. Ventriculomegaly with disproportionate subarachnoid spaces and some callosal angle narrowing but to a degree not definitive for normal pressure hydrocephalus. Vascular: No asymmetric vessel hyperdensity. Right intracranial ICA stenting. Skull: Normal. Negative for fracture or focal lesion. Sinuses/Orbits: Negative Other: These results were communicated to Dr. Leonel Ramsay at 1:35 pmon 8/22/2021by text page via the [facility] messaging system. ASPECTS [facility] Stroke Program Early CT Score) - Ganglionic level infarction (caudate, lentiform nuclei, internal capsule, insula, M1-M3 cortex): 7 - Supraganglionic infarction (M4-M6 cortex): 3 Total score (0-10 with 10 being normal): 10 IMPRESSION: 1. No acute finding. 2. 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 Electronically Signed   By: Monte Fantasia M.D.   On: 02/04/2020 13:38    PHYSICAL EXAM  Temp:  [98.4 F (36.9 C)-99.9 F (37.7 C)] 98.4 F (36.9 C) (08/23 1237) Pulse Rate:  [76-96] 76 (08/23 1237) Resp:  [14-21] 14 (08/23 1237) BP: (128-177)/(71-96) 144/71 (08/23 1237) SpO2:  [94 %-97 %] 94 % (08/23 1237)  General - Well nourished, well developed, sleepy.  Ophthalmologic - fundi not visualized due to  noncooperation.  Cardiovascular - Regular rhythm and rate.  Neuro -initially sleepy, however arousable with voice, needed repetitive stimulation to keep eyes open.  Global aphasia, minimum language output with word salad.  Not  follow commands.  Able to track bilaterally, however seems to have decreased blinking with visual threat on the right.  PERRL, EOMI.  Facial symmetrical, tongue protrusion not corporative.  Bilateral upper and lower extremity muscle strength seems symmetrical. Sensation, coordination not corporative and gait not tested.   ASSESSMENT/PLAN Mr. CLEOPHAS YOAK is a 79 y.o. male with history of HTN, HLD who woke w/ confusion. Presenting with acute aphasia. LSW Saturday night before went to bed. Woke up Sunday 4am with speech difficulty and walking wobbly.   Persistent aphasia - ? Prolonged post ictal ?   Code Stroke CT head No acute abnormality. Small vessel disease. Atrophy. Ventriculomegaly progressed since 2007. ASPECTS 10.     CTA head & neck atherosclerosis throughout. Hx stent assisted R ICA aneurysm coiling.  CT perfusion no core or penumbra  MRI  No acute abnormality. Small vessel disease. Atrophy.   2D Echo EF 60-65%. No source of embolus   EEG no sz  Sudden onset, no fever or sign of infection, slight elevated WBC, low suspicious for CNS infection at this time  Consider MRI with contrast, EEG repeat if no improving over time  LDL 193  HgbA1c 5.6  VTE prophylaxis - Lovenox 40 mg sq daily   aspirin 81 mg daily prior to admission, now on aspirin 325mg  daily.   Therapy recommendations:  pending   Disposition:  pending   Hyponatremia - ?? Etiology for seizure  Not sure about baseline  Na 127->128->131  Per wife, pt two sisters had episodes of stroke-like symptoms with low Na. But no further details. Wife does not know Na levels of pt   Treatment per primary team  Hypertension  Stable . 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 On CIWA protocol  Other Active Problems  Hx prostate CA s/p XRT   Hx herpes zoster  Hx R ICA aneurysm s/p coiling   Leukocytosis WBC 12.2->11.8   Prolonged QT  Hospital day # 0   Rosalin Hawking, MD PhD Stroke Neurology 02/05/2020 2:17 PM   To contact Stroke Continuity provider, please refer to [email or web address] After hours, contact General Neurology

## 2020-02-05 NOTE — Progress Notes (Signed)
vLTM setup in ER-51  No internet in room.  Patient disoriented/confused during setup and pulling at leads on head. Will move study over to server in am if patient leaves wires on his head through the night Neurology notified

## 2020-02-05 NOTE — Progress Notes (Signed)
Inpatient Rehabilitation Admissions Coordinator  Inpatient rehab consult received. Await further medical workup of etiology of his aphasia before proceeding to assist with dispo.   Danne Baxter, RN, MSN Rehab Admissions Coordinator [phone] 02/05/2020 2:54 PM

## 2020-02-05 NOTE — ED Notes (Signed)
Attempted to call EEG tech no answer

## 2020-02-05 NOTE — ED Notes (Signed)
EEG setting up at bedside.

## 2020-02-05 NOTE — ED Notes (Signed)
Pt OTF.

## 2020-02-05 NOTE — Procedures (Addendum)
Patient Name: Carlos Wise  MRN: [id number]  Epilepsy Attending: Lora Havens  Referring Physician/Provider: Dr Roland Rack Date: 02/05/2020 Duration: 29.51 mins  Patient history: 79 year old male with acute onset aphasia. EEG to evaluate for seizure.   Level of alertness: Awake  AEDs during EEG study: Gabapentin  Technical aspects: This EEG study was done with scalp electrodes positioned according to the 10-20 International system of electrode placement. Electrical activity was acquired at a sampling rate of 500Hz  and reviewed with a high frequency filter of 70Hz  and a low frequency filter of 1Hz . EEG data were recorded continuously and digitally stored.   Description: The posterior dominant rhythm consists of 8.5 Hz activity of moderate voltage (25-35 uV) seen predominantly in posterior head regions, asymmetric ( L<R) and reactive to eye opening and eye closing. EEG showed continuous left hemispheric 3 to 6 Hz theta-delta slowing. Frequent sharp waves were seen in left temporal region as well. Hyperventilation and photic stimulation were not performed.     ABNORMALITY - Continuous slow, left hemispheric - Sharp wave, left temporal region - Background asymmetry, left <right  IMPRESSION: This study is showed evidence of epileptogenicity arising from left temporal region as well as cortical dysfunction in left hemisphere likely secondary to underlying structural abnormality, post-ictal state. No seizures were seen throughout the recording.  Naethan Bracewell Barbra Sarks

## 2020-02-05 NOTE — Progress Notes (Signed)
Occupational Therapy Evaluation Patient Details Name: Carlos Wise MRN: [id number] DOB: 02-11-1941 Today's Date: 02/05/2020    History of Present Illness 79 year old male with history of HTN, HLD, admitted to the hospital for confusion and dysarthria which was noted by his wife.  Upon admission CT of the head, CTA head and neck and MRI brain were negative   Clinical Impression   PTA, pt lived at home with his wife and was independent with ADL and mobility. Enjoyed doing Haematologist.  Pt is HOH and is difficult for him to understand through masks. Requires max verbal and gestural cues to complete tasks. Appears to demonstrate apparent sensory motor deficits in addition to communication deficits requiring min A with mobility and max A with ADL tasks. Pt's wife is not able to care for him at this level. Recommend CIR for rehab to facilitate safe DC home with wife. Will follow acutely.     Follow Up Recommendations  Supervision/Assistance - 24 hour;CIR    Equipment Recommendations  None recommended by OT    Recommendations for Other Services Rehab consult     Precautions / Restrictions Precautions Precautions: Fall      Mobility Bed Mobility Overal bed mobility: Needs Assistance Bed Mobility: Supine to Sit;Sit to Supine     Supine to sit: Min assist Sit to supine: Supervision      Transfers Overall transfer level: Needs assistance Equipment used: Rolling walker (2 wheeled) Transfers: Sit to/from [facility] Sit to Stand: Min assist Stand pivot transfers: Min assist       General transfer comment: trys to pull up on RW; does better when hands placed on chair to push up    Balance Overall balance assessment: Needs assistance   Sitting balance-Leahy Scale: Good       Standing balance-Leahy Scale: Fair                             ADL either performed or assessed with clinical judgement   ADL Overall ADL's : Needs assistance/impaired      Grooming: Minimal assistance;Sitting   Upper Body Bathing: Minimal assistance;Sitting   Lower Body Bathing: Moderate assistance;Sit to/from stand   Upper Body Dressing : Moderate assistance;Sitting   Lower Body Dressing: Maximal assistance;Sit to/from stand Lower Body Dressing Details (indicate cue type and reason): once sock placed on foot, pt could pull sock onto foot Toilet Transfer: Minimal assistance;Ambulation   Toileting- Clothing Manipulation and Hygiene: Total assistance Toileting - Clothing Manipulation Details (indicate cue type and reason): incontinent of urine during session     Functional mobility during ADLs: Minimal assistance       Vision   Additional Comments: no deficits at baseline; difficult to assess but dso not notice over/undershooting when reaching for items     Perception Perception Comments: deficits; will further assess   Praxis Praxis Praxis tested?: Deficits Deficits: Initiation;Organization;Limb apraxia    Pertinent Vitals/Pain Pain Assessment: Faces Faces Pain Scale: No hurt     Hand Dominance Right   Extremity/Trunk Assessment Upper Extremity Assessment Upper Extremity Assessment: Generalized weakness;RUE deficits/detail RUE Deficits / Details: appears to have sensory motor deficits with alien type movements of arm at times; however able to use functionally to brush his hair RUE Coordination: decreased fine motor   Lower Extremity Assessment Lower Extremity Assessment: Defer to PT evaluation   Cervical / Trunk Assessment Cervical / Trunk Assessment: Normal   Communication Communication Communication: Receptive difficulties;Expressive difficulties  Cognition Arousal/Alertness: Awake/alert Behavior During Therapy: Flat affect Overall Cognitive Status: Impaired/Different from baseline Area of Impairment: Following commands;Safety/judgement;Awareness;Problem solving;Attention                   Current Attention Level:  Sustained   Following Commands: Follows one step commands inconsistently Safety/Judgement: Decreased awareness of safety;Decreased awareness of deficits Awareness: Intellectual Problem Solving: Slow processing;Decreased initiation;Difficulty sequencing;Requires verbal cues;Requires tactile cues     General Comments       Exercises     Shoulder Instructions      Home Living Family/patient expects to be discharged to:: Private residence Living Arrangements: Spouse/significant other Available Help at Discharge: Family;Available 24 hours/day Type of Home: House Home Access: Stairs to enter [employer] of Steps: 2   Home Layout: Multi-level     Bathroom Shower/Tub: Teacher, early years/pre: Standard Bathroom Accessibility: Yes How Accessible: Accessible via walker Home Equipment: Shower seat (stair lift)      Lives With: Spouse (elderly)    Prior Functioning/Environment Level of Independence: Independent        Comments: drove; enjoys doing yardwork        OT Problem List: Decreased strength;Decreased activity tolerance;Impaired balance (sitting and/or standing);Impaired vision/perception;Decreased coordination;Decreased cognition;Decreased safety awareness;Decreased knowledge of use of DME or AE;Impaired UE functional use      OT Treatment/Interventions: Self-care/ADL training;Therapeutic exercise;Neuromuscular education;DME and/or AE instruction;Therapeutic activities;Cognitive remediation/compensation;Visual/perceptual remediation/compensation;Patient/family education;Balance training    OT Goals(Current goals can be found in the care plan section) Acute Rehab OT Goals Patient Stated Goal: per wife to figure out what is wrong with her husband OT Goal Formulation: With patient/family Time For Goal Achievement: 02/19/20 Potential to Achieve Goals: Good  OT Frequency: Min 2X/week   Barriers to D/C:            Co-evaluation               AM-PAC OT "6 Clicks" Daily Activity     Outcome Measure Help from another person eating meals?: A Lot Help from another person taking care of personal grooming?: A Lot Help from another person toileting, which includes using toliet, bedpan, or urinal?: Total Help from another person bathing (including washing, rinsing, drying)?: A Lot Help from another person to put on and taking off regular upper body clothing?: A Lot Help from another person to put on and taking off regular lower body clothing?: A Lot 6 Click Score: 11   End of Session Equipment Utilized During Treatment: Gait belt;Rolling walker Nurse Communication: Mobility status  Activity Tolerance: Patient tolerated treatment well Patient left: in bed;with family/visitor present  OT Visit Diagnosis: Unsteadiness on feet (R26.81);Other abnormalities of gait and mobility (R26.89);Muscle weakness (generalized) (M62.81);Apraxia (R48.2);Other symptoms and signs involving cognitive function                Time: 1660-6301 OT Time Calculation (min): 28 min Charges:  OT General Charges $OT Visit: 1 Visit OT Evaluation $OT Eval Moderate Complexity: 1 Mod OT Treatments $Self Care/Home Management : 8-22 mins  Maurie Boettcher, OT/L   Acute OT Clinical Specialist Acute Rehabilitation Services Pager [phone] Office [phone]   [facility] 02/05/2020, 4:23 PM

## 2020-02-05 NOTE — ED Notes (Signed)
Carlos Wise wife [phone] would like an update on the pt

## 2020-02-05 NOTE — Evaluation (Signed)
Clinical/Bedside Swallow Evaluation Patient Details  Name: Carlos Wise MRN: [id number] Date of Birth: 10-19-1940  Today's Date: 02/05/2020 Time: SLP Start Time (ACUTE ONLY): 1145 SLP Stop Time (ACUTE ONLY): 1216 SLP Time Calculation (min) (ACUTE ONLY): 31 min  Past Medical History:  Past Medical History:  Diagnosis Date  . Herpes zoster   . Hyperlipemia   . Hypertension   . Intestinal polyp    hyperplastic  . Prostate cancer ([place])   . S/P radiation therapy 12/12/2014 through 02/06/2015    Prostate 7800 cGy in 40 sessions, seminal vesicles 5600 cGy in 40 sessions    Past Surgical History:  Past Surgical History:  Procedure Laterality Date  . Carotid Artery Catherization    . PROSTATE BIOPSY  06/27/2014   HPI:  79 year old male with acute onset aphasia. Neurologist suspects a right visual field cut and right hypoesthesia.  Per neurologist, his symptoms are most consistent with stroke, and given the negative EEG, even though the CT perfusion was negative, he suspects that he has had an acute ischemic stroke.  However, MRI on 8/22 was also negative. PMH includes medical history significant of hypertension, hyperlipidemia, ICA aneurysm s/p coiling , and prostate cancer s/p radiation.    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 Pt was able to hold a cup and take a sip to cup edge or straw, but consistently took an over-large sip, appearing to struggle to stop sipping until mouth was overfull. This was followed by a brief oral hold and effortful appearing swallow with a giant bolus. There were at least 2 instances, over 8 oz assessment, in which this was followed by a single cough or throat clear. Otherwise pt was able to consume purees and solids without  difficulty. He did not impulsively self feed solids. Pt demonstrated gradual improvement during session in a variety of tasks; suspect his coordination will improve with intake. Recommend starting a regular diet and thin liquids, though he will need assist with feeding. Will f/u for tolerance.  SLP Visit Diagnosis: Dysphagia, oropharyngeal phase (R13.12)    Aspiration Risk  Mild aspiration risk    Diet Recommendation Regular;Thin liquid   Liquid Administration via: Cup;Straw Medication Administration: Crushed with puree Supervision: Staff to assist with self feeding Compensations: Minimize environmental distractions;Slow rate;Small sips/bites Postural Changes: Seated upright at 90 degrees    Other  Recommendations Oral Care Recommendations: Oral care BID   Follow up Recommendations Inpatient Rehab      Frequency and Duration min 2x/week  2 weeks       Prognosis        Swallow Study   General HPI: 79 year old male with acute onset aphasia. Neurologist suspects a right visual field cut and right hypoesthesia.  Per neurologist, his symptoms are most consistent with stroke, and given the negative EEG, even though the CT perfusion was negative, he suspects that he has had an acute ischemic stroke.  However, MRI on 8/22 was also negative. PMH includes medical history significant of hypertension, hyperlipidemia, ICA aneurysm s/p coiling , and prostate cancer s/p radiation.  Type of Study: Bedside Swallow Evaluation Diet Prior to this Study: NPO Temperature Spikes Noted: No Respiratory Status: Room air History of Recent Intubation: No Behavior/Cognition: Alert;Cooperative;Requires cueing;Distractible;Confused Oral Cavity Assessment: Within Functional Limits Oral Care Completed by SLP: No Oral Cavity - Dentition: Adequate natural dentition Vision: Impaired for self-feeding  Self-Feeding Abilities: Able to feed self Patient Positioning: Upright in bed Baseline Vocal Quality:  Normal Volitional Cough: Cognitively unable to elicit Volitional Swallow: Unable to elicit    Oral/Motor/Sensory Function Overall Oral Motor/Sensory Function: Other (comment) (unable to follow commands, ?right lingual weakness)   Ice Chips Ice chips: Not tested   Thin Liquid Thin Liquid: Impaired Presentation: Self Fed;Straw;Cup Oral Phase Functional Implications: Prolonged oral transit;Oral holding Pharyngeal  Phase Impairments: Cough - Immediate;Cough - Delayed    Nectar Thick Nectar Thick Liquid: Not tested   Honey Thick Honey Thick Liquid: Not tested   Puree Puree: Within functional limits Presentation: Spoon   Solid     Solid: Within functional limits     Herbie [place], MA [place] Pager [phone] Office [phone]  Lynann Beaver 02/05/2020,12:34 PM

## 2020-02-05 NOTE — ED Notes (Signed)
Pt has pulled out his IV.

## 2020-02-05 NOTE — Progress Notes (Signed)
PROGRESS NOTE    Carlos Wise  QPY:[id number] DOB: 1940/10/22 DOA: 02/04/2020 PCP: [name], MD   Brief Narrative:  79 year old male with history of HTN, HLD, admitted to the hospital for confusion and dysarthria which was noted by his wife.  Upon admission CT of the head, CTA head and neck and MRI brain were negative.  LDL 153, A1c 5.6, echocardiogram showed EF of 60 to 65%.  Neurology was consulted.   Assessment & Plan:   Principal Problem:   Aphasia Active Problems:   Malignant neoplasm of prostate (HCC)   Leukocytosis   Hyponatremia   Prolonged QT interval   Alcohol use  Acute aphasia/dysarthria-incoordination -Unclear exact etiology.  CT head, CTA head and neck, MRI brain negative for acute pathology.  Neurology consulted -LDL 193, start Lipitor 80 mg daily -Echocardiogram 60 to 65% EF, grade 1 diastolic dysfunction -T2W 5.6 -EEG -Speech and swallow, PT/OT evaluation -UA-negative, UDS-positive for benzodiazepine  Prolonged QTC -Admission QTC 589 but difficult to assess.  Repeat EKG t normal  Daily alcohol use -Alcohol withdrawal protocol -Multivitamin, thiamine and folic acid  History of prostate cancer status post radiation -Defer to outpatient oncology  GERD -PPI daily  Peripheral neuropathy -Home gabapentin  If he is consistently taking all his home medications can be slowly restarted including Norvasc, Lexapro, aspirin, vitamin D, Flomax    DVT prophylaxis: Lovenox Code Status: Full code Family Communication: Wife at bedside  Dispo: The patient is from: Home              Anticipated d/c is to: To be determined              Anticipated d/c date is: 2 days              Patient currently is not medically stable to d/c.  Patient is still confused, not necessarily following all the basic commands       There is no height or weight on file to calculate BMI.       Subjective: Patient seen and examined in the ER, wife is at the bedside.   Patient is able to answer and tell me his name but overall a very poor historian. According to patient's wife recently he has had issues with short-term memory but over the last 24 hours she is acutely noticed his change in mental status and able to comprehend things.  During this time he is also had poor oral intake.  Review of Systems Otherwise negative except as per HPI, including: Difficult to obtain from the patient fully  Examination: Constitutional: Not in acute distress, elderly frail Respiratory: Very minimal bibasilar crackles Cardiovascular: Normal sinus rhythm, no rubs Abdomen: Nontender nondistended good bowel sounds Musculoskeletal: No edema noted Skin: No rashes seen Neurologic: Grossly moving all the extremities but difficult to assess full neurologic exam Psychiatric: Alert to name only, poor judgment and insight   Objective: Vitals:   02/04/20 1830 02/04/20 1900 02/05/20 0612 02/05/20 1237  BP: (!) 177/88 (!) 128/93 (!) 159/86 (!) 144/71  Pulse: 91 96 76 76  Resp: 15 17 18 14   Temp:   99.9 F (37.7 C) 98.4 F (36.9 C)  TempSrc:   Oral Oral  SpO2: 96% 96% 94% 94%    Intake/Output Summary (Last 24 hours) at 02/05/2020 1255 Last data filed at 02/05/2020 1043 Gross per 24 hour  Intake --  Output 60 ml  Net -60 ml   There were no vitals filed for this visit.  Data Reviewed:   CBC: Recent Labs  Lab 02/04/20 1319 02/04/20 1328 02/05/20 0634  WBC 12.2*  --  11.8*  NEUTROABS 10.6*  --   --   HGB 15.2 15.3 14.7  HCT 43.5 45.0 41.6  MCV 91.6  --  93.1  PLT 231  --  858   Basic Metabolic Panel: Recent Labs  Lab 02/04/20 1319 02/04/20 1328 02/04/20 1419 02/05/20 0634  NA 127* 128*  --  131*  K 4.2 4.2  --  3.8  CL 92* 94*  --  95*  CO2 21*  --   --  23  GLUCOSE 154* 156*  --  113*  BUN 10 10  --  14  CREATININE 0.90 0.80  --  0.88  CALCIUM 9.5  --   --  9.6  MG  --   --  1.9  --   PHOS  --   --  3.6  --    GFR: CrCl cannot be calculated  (Unknown ideal weight.). Liver Function Tests: Recent Labs  Lab 02/04/20 1319 02/05/20 0634  AST 23 28  ALT 17 16  ALKPHOS 66 55  BILITOT 1.7* 1.9*  PROT 7.6 7.4  ALBUMIN 4.3 4.1   No results for input(s): LIPASE, AMYLASE in the last 168 hours. No results for input(s): AMMONIA in the last 168 hours. Coagulation Profile: No results for input(s): INR, PROTIME in the last 168 hours. Cardiac Enzymes: No results for input(s): CKTOTAL, CKMB, CKMBINDEX, TROPONINI in the last 168 hours. BNP (last 3 results) No results for input(s): PROBNP in the last 8760 hours. HbA1C: Recent Labs    02/04/20 1419  HGBA1C 5.6   CBG: Recent Labs  Lab 02/04/20 1319 02/04/20 1555  GLUCAP 165* 153*   Lipid Profile: Recent Labs    02/04/20 1319  CHOL 274*  HDL 61  LDLCALC 193*  TRIG 98  CHOLHDL 4.5   Thyroid Function Tests: No results for input(s): TSH, T4TOTAL, FREET4, T3FREE, THYROIDAB in the last 72 hours. Anemia Panel: No results for input(s): VITAMINB12, FOLATE, FERRITIN, TIBC, IRON, RETICCTPCT in the last 72 hours. Sepsis Labs: No results for input(s): PROCALCITON, LATICACIDVEN in the last 168 hours.  Recent Results (from the past 240 hour(s))  SARS Coronavirus 2 by RT PCR (hospital order, performed in [facility] hospital lab) Nasopharyngeal Nasopharyngeal Swab     Status: None   Collection Time: 02/04/20  1:28 PM   Specimen: Nasopharyngeal Swab  Result Value Ref Range Status   SARS Coronavirus 2 NEGATIVE NEGATIVE Final    Comment: (NOTE) SARS-CoV-2 target nucleic acids are NOT DETECTED.  The SARS-CoV-2 RNA is generally detectable in upper and lower respiratory specimens during the acute phase of infection. The lowest concentration of SARS-CoV-2 viral copies this assay can detect is 250 copies / mL. A negative result does not preclude SARS-CoV-2 infection and should not be used as the sole basis for treatment or other patient management decisions.  A negative result may  occur with improper specimen collection / handling, submission of specimen other than nasopharyngeal swab, presence of viral mutation(s) within the areas targeted by this assay, and inadequate number of viral copies (<250 copies / mL). A negative result must be combined with clinical observations, patient history, and epidemiological information.  Fact Sheet for Patients:   StrictlyIdeas.no  Fact Sheet for Healthcare Providers: BankingDealers.co.za  This test is not yet approved or  cleared by the [place] FDA and has been authorized for detection and/or diagnosis of  SARS-CoV-2 by FDA under an Emergency Use Authorization (EUA).  This EUA will remain in effect (meaning this test can be used) for the duration of the COVID-19 declaration under Section 564(b)(1) of the Act, 21 U.S.C. section 360bbb-3(b)(1), unless the authorization is terminated or revoked sooner.  Performed at [facility], [place] [address] [place], [place] [address]          Radiology Studies: CT Code Stroke CTA Head W/WO contrast  Result Date: 02/04/2020 CLINICAL DATA:  Aphasia. EXAM: CT ANGIOGRAPHY HEAD AND NECK CT PERFUSION BRAIN TECHNIQUE: Multidetector CT imaging of the head and neck was performed using the standard protocol during bolus administration of intravenous contrast. Multiplanar CT image reconstructions and MIPs were obtained to evaluate the vascular anatomy. Carotid stenosis measurements (when applicable) are obtained utilizing NASCET criteria, using the distal internal carotid diameter as the denominator. Multiphase CT imaging of the brain was performed following IV bolus contrast injection. Subsequent parametric perfusion maps were calculated using RAPID software. CONTRAST:  Dose is currently not known COMPARISON:  Brain MRI 07/06/2005 FINDINGS: CTA NECK FINDINGS Aortic arch: The proximal aorta arch is larger than the descending. No aneurysmal  measurement where covered (maximum 37 mm). Right carotid system: Calcified plaque at the bifurcation and proximal ICA without flow limiting stenosis or ulceration. Left carotid system: Calcified plaque at the bifurcation and proximal ICA without flow limiting stenosis, beading, or ulceration. Vertebral arteries: Proximal subclavian atherosclerosis without significant stenosis. Symmetric vertebral arteries with smooth and widely patent lumens to the dura. Skeleton: Degenerative changes without acute or aggressive finding. Other neck: No significant incidental finding. Upper chest: Negative Review of the MIP images confirms the above findings CTA HEAD FINDINGS Anterior circulation: Atheromatous plaque on the bilateral carotid siphon with right-sided stent spanning cavernous to supraclinoid. No evidence of in stent stenosis. There has been coil embolization of a superior hypophyseal region aneurysm on the right. Hypoplastic left A1 segment. No evident branch occlusion, beading, or filling aneurysm. Posterior circulation: The vertebral and basilar arteries are widely patent. No branch occlusion, beading, or aneurysm. Venous sinuses: Unremarkable Anatomic variants: As above Review of the MIP images confirms the above findings CT Brain Perfusion Findings: ASPECTS: 10 CBF (<30%) Volume: 53mL Perfusion (Tmax>6.0s) volume: 77mL IMPRESSION: CTA head and neck: 1. No emergent finding, including large vessel occlusion. 2. Atherosclerosis without flow limiting stenosis of major vessels in the head and neck. 3. Stent assisted right ICA aneurysm coiling. No evidence of recurrence. CT perfusion: No infarct or penumbra by standard parameters Electronically Signed   By: Monte Fantasia M.D.   On: 02/04/2020 13:48   DG Chest 2 View  Result Date: 02/04/2020 CLINICAL DATA:  Aphasia EXAM: CHEST - 2 VIEW COMPARISON:  Report from March of 2017 FINDINGS: Trachea is midline. Cardiomediastinal contours and hilar structures are normal.  Multiple leads project over the chest. Lungs are clear.  No sign of pleural effusion. On limited assessment skeletal structures are unremarkable. IMPRESSION: No acute cardiopulmonary disease. Electronically Signed   By: Zetta Bills M.D.   On: 02/04/2020 15:27   CT Code Stroke CTA Neck W/WO contrast  Result Date: 02/04/2020 CLINICAL DATA:  Aphasia. EXAM: CT ANGIOGRAPHY HEAD AND NECK CT PERFUSION BRAIN TECHNIQUE: Multidetector CT imaging of the head and neck was performed using the standard protocol during bolus administration of intravenous contrast. Multiplanar CT image reconstructions and MIPs were obtained to evaluate the vascular anatomy. Carotid stenosis measurements (when applicable) are obtained utilizing NASCET criteria, using the distal internal carotid  diameter as the denominator. Multiphase CT imaging of the brain was performed following IV bolus contrast injection. Subsequent parametric perfusion maps were calculated using RAPID software. CONTRAST:  Dose is currently not known COMPARISON:  Brain MRI 07/06/2005 FINDINGS: CTA NECK FINDINGS Aortic arch: The proximal aorta arch is larger than the descending. No aneurysmal measurement where covered (maximum 37 mm). Right carotid system: Calcified plaque at the bifurcation and proximal ICA without flow limiting stenosis or ulceration. Left carotid system: Calcified plaque at the bifurcation and proximal ICA without flow limiting stenosis, beading, or ulceration. Vertebral arteries: Proximal subclavian atherosclerosis without significant stenosis. Symmetric vertebral arteries with smooth and widely patent lumens to the dura. Skeleton: Degenerative changes without acute or aggressive finding. Other neck: No significant incidental finding. Upper chest: Negative Review of the MIP images confirms the above findings CTA HEAD FINDINGS Anterior circulation: Atheromatous plaque on the bilateral carotid siphon with right-sided stent spanning cavernous to  supraclinoid. No evidence of in stent stenosis. There has been coil embolization of a superior hypophyseal region aneurysm on the right. Hypoplastic left A1 segment. No evident branch occlusion, beading, or filling aneurysm. Posterior circulation: The vertebral and basilar arteries are widely patent. No branch occlusion, beading, or aneurysm. Venous sinuses: Unremarkable Anatomic variants: As above Review of the MIP images confirms the above findings CT Brain Perfusion Findings: ASPECTS: 10 CBF (<30%) Volume: 92mL Perfusion (Tmax>6.0s) volume: 74mL IMPRESSION: CTA head and neck: 1. No emergent finding, including large vessel occlusion. 2. Atherosclerosis without flow limiting stenosis of major vessels in the head and neck. 3. Stent assisted right ICA aneurysm coiling. No evidence of recurrence. CT perfusion: No infarct or penumbra by standard parameters Electronically Signed   By: Monte Fantasia M.D.   On: 02/04/2020 13:48   MR Brain Wo Contrast (neuro protocol)  Result Date: 02/04/2020 CLINICAL DATA:  Suspected stroke.  Aphasia. EXAM: MRI HEAD WITHOUT CONTRAST TECHNIQUE: Multiplanar, multiecho pulse sequences of the brain and surrounding structures were obtained without intravenous contrast. COMPARISON:  Head CT 02/04/2020 FINDINGS: Examination degraded by motion. BRAIN: No acute infarct, acute hemorrhage or extra-axial collection. Multifocal white matter hyperintensity, most commonly due to chronic ischemic microangiopathy. Advanced parenchymal volume loss. No chronic microhemorrhage. Normal midline structures. VASCULAR: Major flow voids are preserved. SKULL AND UPPER CERVICAL SPINE: Normal calvarium and skull base. Visualized upper cervical spine and soft tissues are normal. SINUSES/ORBITS: No paranasal sinus fluid levels or advanced mucosal thickening. No mastoid or middle ear effusion. Normal orbits. IMPRESSION: 1. Motion degraded examination. 2. No acute intracranial process. 3. Advanced parenchymal volume  loss and findings of chronic small vessel disease. Electronically Signed   By: Ulyses Jarred M.D.   On: 02/04/2020 21:38   CT Code Stroke Cerebral Perfusion with contrast  Result Date: 02/04/2020 CLINICAL DATA:  Aphasia. EXAM: CT ANGIOGRAPHY HEAD AND NECK CT PERFUSION BRAIN TECHNIQUE: Multidetector CT imaging of the head and neck was performed using the standard protocol during bolus administration of intravenous contrast. Multiplanar CT image reconstructions and MIPs were obtained to evaluate the vascular anatomy. Carotid stenosis measurements (when applicable) are obtained utilizing NASCET criteria, using the distal internal carotid diameter as the denominator. Multiphase CT imaging of the brain was performed following IV bolus contrast injection. Subsequent parametric perfusion maps were calculated using RAPID software. CONTRAST:  Dose is currently not known COMPARISON:  Brain MRI 07/06/2005 FINDINGS: CTA NECK FINDINGS Aortic arch: The proximal aorta arch is larger than the descending. No aneurysmal measurement where covered (maximum 37 mm). Right carotid system:  Calcified plaque at the bifurcation and proximal ICA without flow limiting stenosis or ulceration. Left carotid system: Calcified plaque at the bifurcation and proximal ICA without flow limiting stenosis, beading, or ulceration. Vertebral arteries: Proximal subclavian atherosclerosis without significant stenosis. Symmetric vertebral arteries with smooth and widely patent lumens to the dura. Skeleton: Degenerative changes without acute or aggressive finding. Other neck: No significant incidental finding. Upper chest: Negative Review of the MIP images confirms the above findings CTA HEAD FINDINGS Anterior circulation: Atheromatous plaque on the bilateral carotid siphon with right-sided stent spanning cavernous to supraclinoid. No evidence of in stent stenosis. There has been coil embolization of a superior hypophyseal region aneurysm on the right.  Hypoplastic left A1 segment. No evident branch occlusion, beading, or filling aneurysm. Posterior circulation: The vertebral and basilar arteries are widely patent. No branch occlusion, beading, or aneurysm. Venous sinuses: Unremarkable Anatomic variants: As above Review of the MIP images confirms the above findings CT Brain Perfusion Findings: ASPECTS: 10 CBF (<30%) Volume: 65mL Perfusion (Tmax>6.0s) volume: 55mL IMPRESSION: CTA head and neck: 1. No emergent finding, including large vessel occlusion. 2. Atherosclerosis without flow limiting stenosis of major vessels in the head and neck. 3. Stent assisted right ICA aneurysm coiling. No evidence of recurrence. CT perfusion: No infarct or penumbra by standard parameters Electronically Signed   By: Monte Fantasia M.D.   On: 02/04/2020 13:48   ECHOCARDIOGRAM COMPLETE  Result Date: 02/04/2020    ECHOCARDIOGRAM REPORT   Patient Name:   ASTIN SAYRE Date of Exam: 02/04/2020 Medical Rec #:  [id number]    Height:       67.1 in Accession #:    [id number]   Weight:       196.8 lb Date of Birth:  10-12-1940    BSA:          2.011 m Patient Age:    37 years     BP:           167/96 mmHg Patient Gender: M            HR:           86 bpm. Exam Location:  Inpatient Procedure: 2D Echo, Cardiac Doppler and Color Doppler Indications:    Stroke 434.91/I163.9  History:        Patient has no prior history of Echocardiogram examinations.  Sonographer:    Merrie Roof RDCS Referring Phys: [id number] RONDELL A SMITH  Sonographer Comments: Poor compliance secondary to altered mental status due to stroke. IMPRESSIONS  1. Left ventricular ejection fraction, by estimation, is 60 to 65%. The left ventricle has normal function. The left ventricle has no regional wall motion abnormalities. There is mild concentric left ventricular hypertrophy. Left ventricular diastolic parameters are consistent with Grade I diastolic dysfunction (impaired relaxation).  2. Right ventricular systolic function is  normal. The right ventricular size is normal. There is normal pulmonary artery systolic pressure.  3. The mitral valve is normal in structure. No evidence of mitral valve regurgitation. No evidence of mitral stenosis.  4. The aortic valve is normal in structure. Aortic valve regurgitation is not visualized. No aortic stenosis is present.  5. The inferior vena cava is normal in size with greater than 50% respiratory variability, suggesting right atrial pressure of 3 mmHg. Conclusion(s)/Recommendation(s): No intracardiac source of embolism detected on this transthoracic study. A transesophageal echocardiogram is recommended to exclude cardiac source of embolism if clinically indicated. FINDINGS  Left Ventricle: Left ventricular ejection fraction, by estimation, is 60  to 65%. The left ventricle has normal function. The left ventricle has no regional wall motion abnormalities. The left ventricular internal cavity size was normal in size. There is  mild concentric left ventricular hypertrophy. Left ventricular diastolic parameters are consistent with Grade I diastolic dysfunction (impaired relaxation). Normal left ventricular filling pressure. Right Ventricle: The right ventricular size is normal. No increase in right ventricular wall thickness. Right ventricular systolic function is normal. There is normal pulmonary artery systolic pressure. Left Atrium: Left atrial size was normal in size. Right Atrium: Right atrial size was normal in size. Pericardium: There is no evidence of pericardial effusion. Mitral Valve: The mitral valve is normal in structure. Normal mobility of the mitral valve leaflets. No evidence of mitral valve regurgitation. No evidence of mitral valve stenosis. Tricuspid Valve: The tricuspid valve is normal in structure. Tricuspid valve regurgitation is trivial. No evidence of tricuspid stenosis. Aortic Valve: The aortic valve is normal in structure. Aortic valve regurgitation is not visualized. No  aortic stenosis is present. Pulmonic Valve: The pulmonic valve was normal in structure. Pulmonic valve regurgitation is not visualized. No evidence of pulmonic stenosis. Aorta: The aortic root is normal in size and structure. Venous: The inferior vena cava is normal in size with greater than 50% respiratory variability, suggesting right atrial pressure of 3 mmHg. IAS/Shunts: No atrial level shunt detected by color flow Doppler.  LEFT VENTRICLE PLAX 2D LVIDd:         4.20 cm     Diastology LVIDs:         2.80 cm     LV e' lateral:   8.49 cm/s LV PW:         1.20 cm     LV E/e' lateral: 7.9 LV IVS:        1.10 cm     LV e' medial:    7.40 cm/s LVOT diam:     2.00 cm     LV E/e' medial:  9.1 LV SV:         74 LV SV Index:   37 LVOT Area:     3.14 cm  LV Volumes (MOD) LV vol d, MOD A4C: 63.4 ml LV vol s, MOD A4C: 24.4 ml LV SV MOD A4C:     63.4 ml LEFT ATRIUM           Index LA diam:      3.70 cm 1.84 cm/m LA Vol (A4C): 59.4 ml 29.54 ml/m  AORTIC VALVE LVOT Vmax:   122.00 cm/s LVOT Vmean:  82.300 cm/s LVOT VTI:    0.237 m  AORTA Ao Root diam: 3.50 cm MITRAL VALVE MV Area (PHT): 3.34 cm     SHUNTS MV Decel Time: 227 msec     Systemic VTI:  0.24 m MV E velocity: 67.30 cm/s   Systemic Diam: 2.00 cm MV A velocity: 113.00 cm/s MV E/A ratio:  0.60 Ena Dawley MD Electronically signed by Ena Dawley MD Signature Date/Time: 02/04/2020/4:28:09 PM    Final    CT HEAD CODE STROKE WO CONTRAST`  Result Date: 02/04/2020 CLINICAL DATA:  Code stroke.  Aphasia. EXAM: CT HEAD WITHOUT CONTRAST TECHNIQUE: Contiguous axial images were obtained from the base of the skull through the vertex without intravenous contrast. COMPARISON:  Brain MRI 07/06/2005 FINDINGS: Brain: No evidence of acute infarction, hemorrhage, obstructive hydrocephalus, extra-axial collection or mass lesion/mass effect. Cerebral volume loss. Ventriculomegaly with disproportionate subarachnoid spaces and some callosal angle narrowing but to a degree not  definitive  for normal pressure hydrocephalus. Vascular: No asymmetric vessel hyperdensity. Right intracranial ICA stenting. Skull: Normal. Negative for fracture or focal lesion. Sinuses/Orbits: Negative Other: These results were communicated to Dr. Leonel Ramsay at 1:35 pmon 8/22/2021by text page via the [facility] messaging system. ASPECTS [facility] Stroke Program Early CT Score) - Ganglionic level infarction (caudate, lentiform nuclei, internal capsule, insula, M1-M3 cortex): 7 - Supraganglionic infarction (M4-M6 cortex): 3 Total score (0-10 with 10 being normal): 10 IMPRESSION: 1. No acute finding. 2. 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 Electronically Signed   By: Monte Fantasia M.D.   On: 02/04/2020 13:38        Scheduled Meds: .  stroke: mapping our early stages of recovery book   Does not apply Once  . aspirin  300 mg Rectal Daily   Or  . aspirin  325 mg Oral Daily  . atorvastatin  80 mg Oral Daily  . enoxaparin (LOVENOX) injection  40 mg Subcutaneous Q24H  . folic acid  1 mg Oral Daily  . gabapentin  300 mg Oral BID  . LORazepam  0-4 mg Intravenous Q6H   Followed by  . [START ON 02/06/2020] LORazepam  0-4 mg Intravenous Q12H  . LORazepam  1 mg Intravenous Once  . multivitamin with minerals  1 tablet Oral Daily  . pantoprazole  40 mg Oral QAC breakfast  . thiamine  100 mg Oral Daily   Or  . thiamine  100 mg Intravenous Daily   Continuous Infusions: . sodium chloride 50 mL/hr at 02/04/20 1851     LOS: 0 days   Time spent= 35 mins    Melbert Botelho Arsenio Loader, MD [facility]  If 7PM-7AM, please contact night-coverage  02/05/2020, 12:55 PM

## 2020-02-05 NOTE — Evaluation (Signed)
Speech Language Pathology Evaluation Patient Details Name: Carlos Wise MRN: [id number] DOB: 05-Apr-1941 Today's Date: 02/05/2020 Time: 4580-9983 SLP Time Calculation (min) (ACUTE ONLY): 31 min  Problem List:  Patient Active Problem List   Diagnosis Date Noted  . Aphasia 02/04/2020  . Leukocytosis 02/04/2020  . Hyponatremia 02/04/2020  . Prolonged QT interval 02/04/2020  . 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 Malignant neoplasm of prostate ([place]) 07/24/2014   Past Medical History:  Past Medical History:  Diagnosis Date  . Herpes zoster   . Hyperlipemia   . Hypertension   . Intestinal polyp    hyperplastic  . Prostate cancer ([place])   . S/P radiation therapy 12/12/2014 through 02/06/2015    Prostate 7800 cGy in 40 sessions, seminal vesicles 5600 cGy in 40 sessions    Past Surgical History:  Past Surgical History:  Procedure Laterality Date  . Carotid Artery Catherization    . PROSTATE BIOPSY  06/27/2014   HPI:  79 year old male with acute onset aphasia. Neurologist suspects a right visual field cut and right hypoesthesia.  Per neurologist, his symptoms are most consistent with stroke, and given the negative EEG, even though the CT perfusion was negative, he suspects that he has had an acute ischemic stroke.  However, MRI on 8/22 was also negative. PMH includes medical history significant of hypertension, hyperlipidemia, ICA aneurysm s/p coiling , and prostate cancer s/p radiation.    Assessment / Plan / Recommendation Clinical Impression  Pt demonstrates cognitive impairment and a severe aphasia with expressive and receptive deficits possibly complicated by motor planning impairment. Pt is inattentive to the right visual field, requiring auditory cues to make eye contact on right, and is often unable to recognize objects on the right, including his own hand. With cueing and in functional tasks, pt can briefly focus  attention, but is easily distracted by environment. In functional tasks at midline pt needs max fading to min hand over hand/tactile assist to recruit and coordinate RUE (use of spoon/straw, etc). Total assist initially needed for any problem solving. Verbal expression is characterized by incomprehensible jargon, with some aspects of sterotypy and perseveration, very consistent with a Wernicke's-like fluent output. Pt appears aware of his errors however, and will frequently shout out an obscenity in frusteration. He cannot name or repeat, respond Y/N or count despite heavy multimodal cueing. Though pts auditory comprehension was severely inhibited by motor planning impairment and perseveration, pt was able to recognize two numbers and hold up fingers correctly. Hopeful for ongoing improvment as pt was stimulable to efforts in the room. He would not be capable of functional independence or self care at this time and needs f/u at intensive rehabilitation. Recommend CIR.     SLP Assessment  SLP Visit Diagnosis: Dysphagia, oropharyngeal phase (R13.12)    Follow Up Recommendations  Inpatient Rehab    Frequency and Duration min 2x/week  2 weeks      SLP Evaluation Cognition  Overall Cognitive Status: Impaired/Different from baseline Arousal/Alertness: Awake/alert Orientation Level: Oriented to person Attention: Focused;Sustained Focused Attention: Appears intact Sustained Attention: Impaired Sustained Attention Impairment: Verbal basic;Functional basic Awareness: Impaired Awareness Impairment: Intellectual impairment (emergent improving) Problem Solving: Impaired Problem Solving Impairment: Verbal basic;Functional basic Executive Function: Initiating;Self Monitoring Initiating: Impaired Initiating Impairment: Verbal basic;Functional basic Self Monitoring: Impaired Self Monitoring Impairment: Verbal basic;Functional basic Safety/Judgment: Other (comment) (not assessed)       Comprehension   Auditory Comprehension Overall Auditory Comprehension: Impaired Yes/No Questions: Impaired Basic Biographical Questions: 0-25% accurate Commands: Impaired One Step  Basic Commands: 0-24% accurate Interfering Components: Attention;Visual impairments;Motor planning Reading Comprehension Reading Status: Not tested    Expression Verbal Expression Overall Verbal Expression: Impaired Initiation: Impaired Automatic Speech: Social Response ("thats good" cursing) Level of Generative/Spontaneous Verbalization: Phrase;Word Repetition: Impaired Level of Impairment: Word level Naming: Impairment Verbal Errors: Jargon;Perseveration;Aware of errors;Not aware of errors Pragmatics: No impairment Written Expression Dominant Hand: Right   Oral / Motor  Oral Motor/Sensory Function Overall Oral Motor/Sensory Function: Other (comment) (unable to follow commands ?right lingual weakness) Motor Speech Overall Motor Speech: Impaired Respiration: Impaired Level of Impairment: Word Phonation: Normal Resonance: Within functional limits Motor Planning: Impaired Level of Impairment: Word   GO                   Herbie Baltimore, MA [place] Pager [phone] Office [phone]  Lynann Beaver 02/05/2020, 1:02 PM

## 2020-02-06 DIAGNOSIS — R569 Unspecified convulsions: Secondary | ICD-10-CM | POA: Diagnosis not present

## 2020-02-06 DIAGNOSIS — Z7289 Other problems related to lifestyle: Secondary | ICD-10-CM | POA: Diagnosis not present

## 2020-02-06 DIAGNOSIS — E782 Mixed hyperlipidemia: Secondary | ICD-10-CM | POA: Diagnosis not present

## 2020-02-06 DIAGNOSIS — R9431 Abnormal electrocardiogram [ECG] [EKG]: Secondary | ICD-10-CM | POA: Diagnosis not present

## 2020-02-06 DIAGNOSIS — R4701 Aphasia: Secondary | ICD-10-CM | POA: Diagnosis not present

## 2020-02-06 DIAGNOSIS — R2990 Unspecified symptoms and signs involving the nervous system: Secondary | ICD-10-CM | POA: Diagnosis not present

## 2020-02-06 DIAGNOSIS — D72829 Elevated white blood cell count, unspecified: Secondary | ICD-10-CM | POA: Diagnosis not present

## 2020-02-06 LAB — COMPREHENSIVE METABOLIC PANEL
ALT: 20 U/L (ref 0–44)
AST: 46 U/L — ABNORMAL HIGH (ref 15–41)
Albumin: 3.9 g/dL (ref 3.5–5.0)
Alkaline Phosphatase: 50 U/L (ref 38–126)
Anion gap: 9 (ref 5–15)
BUN: 16 mg/dL (ref 8–23)
CO2: 24 mmol/L (ref 22–32)
Calcium: 9.3 mg/dL (ref 8.9–10.3)
Chloride: 97 mmol/L — ABNORMAL LOW (ref 98–111)
Creatinine, Ser: 0.97 mg/dL (ref 0.61–1.24)
GFR calc Af Amer: >60 mL/min (ref >60)
GFR calc non Af Amer: >60 mL/min (ref >60)
Glucose, Bld: 93 mg/dL (ref 70–99)
Potassium: 3.5 mmol/L (ref 3.5–5.1)
Sodium: 130 mmol/L — ABNORMAL LOW (ref 135–145)
Total Bilirubin: 1.6 mg/dL — ABNORMAL HIGH (ref 0.3–1.2)
Total Protein: 6.8 g/dL (ref 6.5–8.1)

## 2020-02-06 LAB — CBC
HCT: 41.2 % (ref 39.0–52.0)
Hemoglobin: 14.4 g/dL (ref 13.0–17.0)
MCH: 33 pg (ref 26.0–34.0)
MCHC: 35 g/dL (ref 30.0–36.0)
MCV: 94.3 fL (ref 80.0–100.0)
Platelets: 224 10*3/uL (ref 150–400)
RBC: 4.37 MIL/uL (ref 4.22–5.81)
RDW: 11.9 % (ref 11.5–15.5)
WBC: 10.4 10*3/uL (ref 4.0–10.5)
nRBC: 0 % (ref 0.0–0.2)

## 2020-02-06 LAB — MAGNESIUM: Magnesium: 2.1 mg/dL (ref 1.7–2.4)

## 2020-02-06 MED ORDER — AMLODIPINE BESYLATE 5 MG PO TABS
2.5000 mg | ORAL_TABLET | Freq: Every day | ORAL | Status: DC
  Administered 2020-02-06 – 2020-02-09 (×4): 2.5 mg via ORAL
  Filled 2020-02-06 (×4): qty 1

## 2020-02-06 MED ORDER — POLYETHYLENE GLYCOL 3350 17 G PO PACK: 17.0000 g | PACK | Freq: Every day | ORAL | Status: DC | PRN

## 2020-02-06 NOTE — Progress Notes (Addendum)
PROGRESS NOTE    Carlos Wise  WRU:[id number] DOB: [birth year] DOA: 02/04/2020 PCP: [name], MD   Brief Narrative: Carlos Wise is a 79 y.o. male with history of HTN, HLD, admitted to the hospital for confusion and dysarthria. Initial concern for stroke for which his workup was negative for an acute stroke. Neurology is on board and currently patient is undergoing continuous EEG to evaluate for seizures.   Assessment & Plan:   Principal Problem:   Aphasia Active Problems:   Malignant neoplasm of prostate (HCC)   Leukocytosis   Hyponatremia   Prolonged QT interval   Alcohol use   Aphasia Dysarthria Unknown etiology. Imaging negative for stroke. Patient has had a CT head, CTA head/neck and MRI brain. Neurology on board and started continuous EEG which is abnormal with no definite seizure activity. It seems his deficits have improved. -Neurology recommendations: EEG; recommendations pending today -PT/OT recommendations: CIR recommended however per CIR evaluation he lacks medical necessity so plan for discharge to SNF.  Prolonged QTc Appears to be resolved on repeat EKG.  Alcohol abuse Started on CIWA. -Continue thiamine, MVI, folic acid  History of prostate cancer S/p radiation. Patient follows with urology as an outpatient although notes are not available to view.  GERD -Continue Protonix  Peripheral neuropathy Patient is on gabapentin 300 mg 1-2 times daily as an outpatient -Continue gabapentin 300 mg BID  Essential hypertension Patient is on amlodipine 2.5 mg daily as an outpatient. BP has been slightly elevated -Restart home amlodipine 2.5 mg and titrate as needed  Hyperlipidemia LDL of 193. Patient has simvastatin listed on home meds but apparently is not taking. -Continue Lipitor  Grade 1 diastolic heart dysfunction No evidence of heart failure.   DVT prophylaxis: Lovenox Code Status:   Code Status: Full Code Family Communication: Called wife  on telephone; no response Disposition Plan: Discharge to SNF when bed available and pending continued neurology workup. Per neurology, not at baseline.   Consultants:   Neurology  Procedures:   TRANSTHORACIC ECHOCARDIOGRAM (02/04/2020) IMPRESSIONS    1. Left ventricular ejection fraction, by estimation, is 60 to 65%. The  left ventricle has normal function. The left ventricle has no regional  wall motion abnormalities. There is mild concentric left ventricular  hypertrophy. Left ventricular diastolic  parameters are consistent with Grade I diastolic dysfunction (impaired  relaxation).  2. Right ventricular systolic function is normal. The right ventricular  size is normal. There is normal pulmonary artery systolic pressure.  3. The mitral valve is normal in structure. No evidence of mitral valve  regurgitation. No evidence of mitral stenosis.  4. The aortic valve is normal in structure. Aortic valve regurgitation is  not visualized. No aortic stenosis is present.  5. The inferior vena cava is normal in size with greater than 50%  respiratory variability, suggesting right atrial pressure of 3 mmHg.   Conclusion(s)/Recommendation(s): No intracardiac source of embolism  detected on this transthoracic study. A transesophageal echocardiogram is  recommended to exclude cardiac source of embolism if clinically indicated.   Antimicrobials:  None    Subjective: Does not like his mittens  Objective: Vitals:   02/05/20 1940 02/05/20 2150 02/05/20 2311 02/06/20 0739  BP: 129/79 (!) 148/81 (!) 144/82 (!) 144/94  Pulse: 79 73 69 66  Resp: 16 16 19 19   Temp: 98.8 F (37.1 C) 98.5 F (36.9 C) 98.2 F (36.8 C) 98 F (36.7 C)  TempSrc: Oral Oral Oral Oral  SpO2: 95% 96%  97% 100%   No intake or output data in the 24 hours ending 02/06/20 1250 There were no vitals filed for this visit.  Examination:  General exam: Appears calm and comfortable Respiratory system: Mild  wheezing bilaterally. Respiratory effort normal. Cardiovascular system: S1 & S2 heard, RRR. No murmurs, rubs, gallops or clicks. Gastrointestinal system: Abdomen is nondistended, soft and nontender. No organomegaly or masses felt. Normal bowel sounds heard. Central nervous system: Alert and oriented to person, place and time. Slow to respond Musculoskeletal: No edema. No calf tenderness Skin: No cyanosis. No rashes Psychiatry: Judgement and insight appear impaired.    Data Reviewed: I have personally reviewed following labs and imaging studies  CBC Lab Results  Component Value Date   WBC 10.4 02/06/2020   RBC 4.37 02/06/2020   HGB 14.4 02/06/2020   HCT 41.2 02/06/2020   MCV 94.3 02/06/2020   MCH 33.0 02/06/2020   PLT 224 02/06/2020   MCHC 35.0 02/06/2020   RDW 11.9 02/06/2020   LYMPHSABS 1.0 02/04/2020   MONOABS 0.5 02/04/2020   EOSABS 0.0 02/04/2020   BASOSABS 0.1 02/40/9735     Last metabolic panel Lab Results  Component Value Date   NA 130 (L) 02/06/2020   K 3.5 02/06/2020   CL 97 (L) 02/06/2020   CO2 24 02/06/2020   BUN 16 02/06/2020   CREATININE 0.97 02/06/2020   GLUCOSE 93 02/06/2020   GFRNONAA >60 02/06/2020   GFRAA >60 02/06/2020   CALCIUM 9.3 02/06/2020   PHOS 3.6 02/04/2020   PROT 6.8 02/06/2020   ALBUMIN 3.9 02/06/2020   BILITOT 1.6 (H) 02/06/2020   ALKPHOS 50 02/06/2020   AST 46 (H) 02/06/2020   ALT 20 02/06/2020   ANIONGAP 9 02/06/2020    CBG (last 3)  Recent Labs    02/04/20 1319 02/04/20 1555  GLUCAP 165* 153*     GFR: CrCl cannot be calculated (Unknown ideal weight.).  Coagulation Profile: No results for input(s): INR, PROTIME in the last 168 hours.  Recent Results (from the past 240 hour(s))  SARS Coronavirus 2 by RT PCR (hospital order, performed in [facility] hospital lab) Nasopharyngeal Nasopharyngeal Swab     Status: None   Collection Time: 02/04/20  1:28 PM   Specimen: Nasopharyngeal Swab  Result Value Ref Range Status    SARS Coronavirus 2 NEGATIVE NEGATIVE Final    Comment: (NOTE) SARS-CoV-2 target nucleic acids are NOT DETECTED.  The SARS-CoV-2 RNA is generally detectable in upper and lower respiratory specimens during the acute phase of infection. The lowest concentration of SARS-CoV-2 viral copies this assay can detect is 250 copies / mL. A negative result does not preclude SARS-CoV-2 infection and should not be used as the sole basis for treatment or other patient management decisions.  A negative result may occur with improper specimen collection / handling, submission of specimen other than nasopharyngeal swab, presence of viral mutation(s) within the areas targeted by this assay, and inadequate number of viral copies (<250 copies / mL). A negative result must be combined with clinical observations, patient history, and epidemiological information.  Fact Sheet for Patients:   StrictlyIdeas.no  Fact Sheet for Healthcare Providers: BankingDealers.co.za  This test is not yet approved or  cleared by the [place] FDA and has been authorized for detection and/or diagnosis of SARS-CoV-2 by FDA under an Emergency Use Authorization (EUA).  This EUA will remain in effect (meaning this test can be used) for the duration of the COVID-19 declaration under Section 564(b)(1) of  the Act, 21 U.S.C. section 360bbb-3(b)(1), unless the authorization is terminated or revoked sooner.  Performed at [facility], [place] [address], [place], [place] [address]         Radiology Studies: CT Code Stroke CTA Head W/WO contrast  Result Date: 02/04/2020 CLINICAL DATA:  Aphasia. EXAM: CT ANGIOGRAPHY HEAD AND NECK CT PERFUSION BRAIN TECHNIQUE: Multidetector CT imaging of the head and neck was performed using the standard protocol during bolus administration of intravenous contrast. Multiplanar CT image reconstructions and MIPs were obtained to evaluate the vascular  anatomy. Carotid stenosis measurements (when applicable) are obtained utilizing NASCET criteria, using the distal internal carotid diameter as the denominator. Multiphase CT imaging of the brain was performed following IV bolus contrast injection. Subsequent parametric perfusion maps were calculated using RAPID software. CONTRAST:  Dose is currently not known COMPARISON:  Brain MRI 07/06/2005 FINDINGS: CTA NECK FINDINGS Aortic arch: The proximal aorta arch is larger than the descending. No aneurysmal measurement where covered (maximum 37 mm). Right carotid system: Calcified plaque at the bifurcation and proximal ICA without flow limiting stenosis or ulceration. Left carotid system: Calcified plaque at the bifurcation and proximal ICA without flow limiting stenosis, beading, or ulceration. Vertebral arteries: Proximal subclavian atherosclerosis without significant stenosis. Symmetric vertebral arteries with smooth and widely patent lumens to the dura. Skeleton: Degenerative changes without acute or aggressive finding. Other neck: No significant incidental finding. Upper chest: Negative Review of the MIP images confirms the above findings CTA HEAD FINDINGS Anterior circulation: Atheromatous plaque on the bilateral carotid siphon with right-sided stent spanning cavernous to supraclinoid. No evidence of in stent stenosis. There has been coil embolization of a superior hypophyseal region aneurysm on the right. Hypoplastic left A1 segment. No evident branch occlusion, beading, or filling aneurysm. Posterior circulation: The vertebral and basilar arteries are widely patent. No branch occlusion, beading, or aneurysm. Venous sinuses: Unremarkable Anatomic variants: As above Review of the MIP images confirms the above findings CT Brain Perfusion Findings: ASPECTS: 10 CBF (<30%) Volume: 37mL Perfusion (Tmax>6.0s) volume: 78mL IMPRESSION: CTA head and neck: 1. No emergent finding, including large vessel occlusion. 2.  Atherosclerosis without flow limiting stenosis of major vessels in the head and neck. 3. Stent assisted right ICA aneurysm coiling. No evidence of recurrence. CT perfusion: No infarct or penumbra by standard parameters Electronically Signed   By: Monte Fantasia M.D.   On: 02/04/2020 13:48   DG Chest 2 View  Result Date: 02/04/2020 CLINICAL DATA:  Aphasia EXAM: CHEST - 2 VIEW COMPARISON:  Report from March of 2017 FINDINGS: Trachea is midline. Cardiomediastinal contours and hilar structures are normal. Multiple leads project over the chest. Lungs are clear.  No sign of pleural effusion. On limited assessment skeletal structures are unremarkable. IMPRESSION: No acute cardiopulmonary disease. Electronically Signed   By: Zetta Bills M.D.   On: 02/04/2020 15:27   CT Code Stroke CTA Neck W/WO contrast  Result Date: 02/04/2020 CLINICAL DATA:  Aphasia. EXAM: CT ANGIOGRAPHY HEAD AND NECK CT PERFUSION BRAIN TECHNIQUE: Multidetector CT imaging of the head and neck was performed using the standard protocol during bolus administration of intravenous contrast. Multiplanar CT image reconstructions and MIPs were obtained to evaluate the vascular anatomy. Carotid stenosis measurements (when applicable) are obtained utilizing NASCET criteria, using the distal internal carotid diameter as the denominator. Multiphase CT imaging of the brain was performed following IV bolus contrast injection. Subsequent parametric perfusion maps were calculated using RAPID software. CONTRAST:  Dose is currently not known COMPARISON:  Brain MRI 07/06/2005 FINDINGS: CTA NECK FINDINGS Aortic arch: The proximal aorta arch is larger than the descending. No aneurysmal measurement where covered (maximum 37 mm). Right carotid system: Calcified plaque at the bifurcation and proximal ICA without flow limiting stenosis or ulceration. Left carotid system: Calcified plaque at the bifurcation and proximal ICA without flow limiting stenosis, beading, or  ulceration. Vertebral arteries: Proximal subclavian atherosclerosis without significant stenosis. Symmetric vertebral arteries with smooth and widely patent lumens to the dura. Skeleton: Degenerative changes without acute or aggressive finding. Other neck: No significant incidental finding. Upper chest: Negative Review of the MIP images confirms the above findings CTA HEAD FINDINGS Anterior circulation: Atheromatous plaque on the bilateral carotid siphon with right-sided stent spanning cavernous to supraclinoid. No evidence of in stent stenosis. There has been coil embolization of a superior hypophyseal region aneurysm on the right. Hypoplastic left A1 segment. No evident branch occlusion, beading, or filling aneurysm. Posterior circulation: The vertebral and basilar arteries are widely patent. No branch occlusion, beading, or aneurysm. Venous sinuses: Unremarkable Anatomic variants: As above Review of the MIP images confirms the above findings CT Brain Perfusion Findings: ASPECTS: 10 CBF (<30%) Volume: 68mL Perfusion (Tmax>6.0s) volume: 81mL IMPRESSION: CTA head and neck: 1. No emergent finding, including large vessel occlusion. 2. Atherosclerosis without flow limiting stenosis of major vessels in the head and neck. 3. Stent assisted right ICA aneurysm coiling. No evidence of recurrence. CT perfusion: No infarct or penumbra by standard parameters Electronically Signed   By: Monte Fantasia M.D.   On: 02/04/2020 13:48   MR Brain Wo Contrast (neuro protocol)  Result Date: 02/04/2020 CLINICAL DATA:  Suspected stroke.  Aphasia. EXAM: MRI HEAD WITHOUT CONTRAST TECHNIQUE: Multiplanar, multiecho pulse sequences of the brain and surrounding structures were obtained without intravenous contrast. COMPARISON:  Head CT 02/04/2020 FINDINGS: Examination degraded by motion. BRAIN: No acute infarct, acute hemorrhage or extra-axial collection. Multifocal white matter hyperintensity, most commonly due to chronic ischemic  microangiopathy. Advanced parenchymal volume loss. No chronic microhemorrhage. Normal midline structures. VASCULAR: Major flow voids are preserved. SKULL AND UPPER CERVICAL SPINE: Normal calvarium and skull base. Visualized upper cervical spine and soft tissues are normal. SINUSES/ORBITS: No paranasal sinus fluid levels or advanced mucosal thickening. No mastoid or middle ear effusion. Normal orbits. IMPRESSION: 1. Motion degraded examination. 2. No acute intracranial process. 3. Advanced parenchymal volume loss and findings of chronic small vessel disease. Electronically Signed   By: Ulyses Jarred M.D.   On: 02/04/2020 21:38   CT Code Stroke Cerebral Perfusion with contrast  Result Date: 02/04/2020 CLINICAL DATA:  Aphasia. EXAM: CT ANGIOGRAPHY HEAD AND NECK CT PERFUSION BRAIN TECHNIQUE: Multidetector CT imaging of the head and neck was performed using the standard protocol during bolus administration of intravenous contrast. Multiplanar CT image reconstructions and MIPs were obtained to evaluate the vascular anatomy. Carotid stenosis measurements (when applicable) are obtained utilizing NASCET criteria, using the distal internal carotid diameter as the denominator. Multiphase CT imaging of the brain was performed following IV bolus contrast injection. Subsequent parametric perfusion maps were calculated using RAPID software. CONTRAST:  Dose is currently not known COMPARISON:  Brain MRI 07/06/2005 FINDINGS: CTA NECK FINDINGS Aortic arch: The proximal aorta arch is larger than the descending. No aneurysmal measurement where covered (maximum 37 mm). Right carotid system: Calcified plaque at the bifurcation and proximal ICA without flow limiting stenosis or ulceration. Left carotid system: Calcified plaque at the bifurcation and proximal ICA without flow limiting stenosis, beading, or ulceration. Vertebral arteries: Proximal  subclavian atherosclerosis without significant stenosis. Symmetric vertebral arteries with  smooth and widely patent lumens to the dura. Skeleton: Degenerative changes without acute or aggressive finding. Other neck: No significant incidental finding. Upper chest: Negative Review of the MIP images confirms the above findings CTA HEAD FINDINGS Anterior circulation: Atheromatous plaque on the bilateral carotid siphon with right-sided stent spanning cavernous to supraclinoid. No evidence of in stent stenosis. There has been coil embolization of a superior hypophyseal region aneurysm on the right. Hypoplastic left A1 segment. No evident branch occlusion, beading, or filling aneurysm. Posterior circulation: The vertebral and basilar arteries are widely patent. No branch occlusion, beading, or aneurysm. Venous sinuses: Unremarkable Anatomic variants: As above Review of the MIP images confirms the above findings CT Brain Perfusion Findings: ASPECTS: 10 CBF (<30%) Volume: 10mL Perfusion (Tmax>6.0s) volume: 82mL IMPRESSION: CTA head and neck: 1. No emergent finding, including large vessel occlusion. 2. Atherosclerosis without flow limiting stenosis of major vessels in the head and neck. 3. Stent assisted right ICA aneurysm coiling. No evidence of recurrence. CT perfusion: No infarct or penumbra by standard parameters Electronically Signed   By: Monte Fantasia M.D.   On: 02/04/2020 13:48   EEG adult  Result Date: 02/05/2020 Lora Havens, MD     02/05/2020  7:44 PM Patient Name: PARNELL SPIELER MRN: [id number] Epilepsy Attending: Lora Havens Referring Physician/Provider: Dr Roland Rack Date: 02/05/2020 Duration: 29.51 mins Patient history: 79 year old male with acute onset aphasia. EEG to evaluate for seizure. Level of alertness: Awake AEDs during EEG study: Gabapentin Technical aspects: This EEG study was done with scalp electrodes positioned according to the 10-20 International system of electrode placement. Electrical activity was acquired at a sampling rate of 500Hz  and reviewed with a high  frequency filter of 70Hz  and a low frequency filter of 1Hz . EEG data were recorded continuously and digitally stored. Description: The posterior dominant rhythm consists of 8.5 Hz activity of moderate voltage (25-35 uV) seen predominantly in posterior head regions, asymmetric ( L<R) and reactive to eye opening and eye closing. EEG showed continuous left hemispheric 3 to 6 Hz theta-delta slowing. Frequent sharp waves were seen in left temporal region as well. Hyperventilation and photic stimulation were not performed.   ABNORMALITY - Continuous slow, left hemispheric - Sharp wave, left temporal region - Background asymmetry, left <right IMPRESSION: This study is showed evidence of epileptogenicity arising from left temporal region as well as cortical dysfunction in left hemisphere likely secondary to underlying structural abnormality, post-ictal state. No seizures were seen throughout the recording. Lora Havens   ECHOCARDIOGRAM COMPLETE  Result Date: 02/04/2020    ECHOCARDIOGRAM REPORT   Patient Name:   SYMEON PULEO Date of Exam: 02/04/2020 Medical Rec #:  [id number]    Height:       67.1 in Accession #:    [id number]   Weight:       196.8 lb Date of Birth:  April 13, 1941    BSA:          2.011 m Patient Age:    87 years     BP:           167/96 mmHg Patient Gender: M            HR:           86 bpm. Exam Location:  Inpatient Procedure: 2D Echo, Cardiac Doppler and Color Doppler Indications:    Stroke 434.91/I163.9  History:        Patient has  no prior history of Echocardiogram examinations.  Sonographer:    Merrie Roof RDCS Referring Phys: [id number] RONDELL A SMITH  Sonographer Comments: Poor compliance secondary to altered mental status due to stroke. IMPRESSIONS  1. Left ventricular ejection fraction, by estimation, is 60 to 65%. The left ventricle has normal function. The left ventricle has no regional wall motion abnormalities. There is mild concentric left ventricular hypertrophy. Left ventricular diastolic  parameters are consistent with Grade I diastolic dysfunction (impaired relaxation).  2. Right ventricular systolic function is normal. The right ventricular size is normal. There is normal pulmonary artery systolic pressure.  3. The mitral valve is normal in structure. No evidence of mitral valve regurgitation. No evidence of mitral stenosis.  4. The aortic valve is normal in structure. Aortic valve regurgitation is not visualized. No aortic stenosis is present.  5. The inferior vena cava is normal in size with greater than 50% respiratory variability, suggesting right atrial pressure of 3 mmHg. Conclusion(s)/Recommendation(s): No intracardiac source of embolism detected on this transthoracic study. A transesophageal echocardiogram is recommended to exclude cardiac source of embolism if clinically indicated. FINDINGS  Left Ventricle: Left ventricular ejection fraction, by estimation, is 60 to 65%. The left ventricle has normal function. The left ventricle has no regional wall motion abnormalities. The left ventricular internal cavity size was normal in size. There is  mild concentric left ventricular hypertrophy. Left ventricular diastolic parameters are consistent with Grade I diastolic dysfunction (impaired relaxation). Normal left ventricular filling pressure. Right Ventricle: The right ventricular size is normal. No increase in right ventricular wall thickness. Right ventricular systolic function is normal. There is normal pulmonary artery systolic pressure. Left Atrium: Left atrial size was normal in size. Right Atrium: Right atrial size was normal in size. Pericardium: There is no evidence of pericardial effusion. Mitral Valve: The mitral valve is normal in structure. Normal mobility of the mitral valve leaflets. No evidence of mitral valve regurgitation. No evidence of mitral valve stenosis. Tricuspid Valve: The tricuspid valve is normal in structure. Tricuspid valve regurgitation is trivial. No evidence of  tricuspid stenosis. Aortic Valve: The aortic valve is normal in structure. Aortic valve regurgitation is not visualized. No aortic stenosis is present. Pulmonic Valve: The pulmonic valve was normal in structure. Pulmonic valve regurgitation is not visualized. No evidence of pulmonic stenosis. Aorta: The aortic root is normal in size and structure. Venous: The inferior vena cava is normal in size with greater than 50% respiratory variability, suggesting right atrial pressure of 3 mmHg. IAS/Shunts: No atrial level shunt detected by color flow Doppler.  LEFT VENTRICLE PLAX 2D LVIDd:         4.20 cm     Diastology LVIDs:         2.80 cm     LV e' lateral:   8.49 cm/s LV PW:         1.20 cm     LV E/e' lateral: 7.9 LV IVS:        1.10 cm     LV e' medial:    7.40 cm/s LVOT diam:     2.00 cm     LV E/e' medial:  9.1 LV SV:         74 LV SV Index:   37 LVOT Area:     3.14 cm  LV Volumes (MOD) LV vol d, MOD A4C: 63.4 ml LV vol s, MOD A4C: 24.4 ml LV SV MOD A4C:     63.4 ml LEFT ATRIUM  Index LA diam:      3.70 cm 1.84 cm/m LA Vol (A4C): 59.4 ml 29.54 ml/m  AORTIC VALVE LVOT Vmax:   122.00 cm/s LVOT Vmean:  82.300 cm/s LVOT VTI:    0.237 m  AORTA Ao Root diam: 3.50 cm MITRAL VALVE MV Area (PHT): 3.34 cm     SHUNTS MV Decel Time: 227 msec     Systemic VTI:  0.24 m MV E velocity: 67.30 cm/s   Systemic Diam: 2.00 cm MV A velocity: 113.00 cm/s MV E/A ratio:  0.60 Ena Dawley MD Electronically signed by Ena Dawley MD Signature Date/Time: 02/04/2020/4:28:09 PM    Final    CT HEAD CODE STROKE WO CONTRAST`  Result Date: 02/04/2020 CLINICAL DATA:  Code stroke.  Aphasia. EXAM: CT HEAD WITHOUT CONTRAST TECHNIQUE: Contiguous axial images were obtained from the base of the skull through the vertex without intravenous contrast. COMPARISON:  Brain MRI 07/06/2005 FINDINGS: Brain: No evidence of acute infarction, hemorrhage, obstructive hydrocephalus, extra-axial collection or mass lesion/mass effect. Cerebral volume  loss. Ventriculomegaly with disproportionate subarachnoid spaces and some callosal angle narrowing but to a degree not definitive for normal pressure hydrocephalus. Vascular: No asymmetric vessel hyperdensity. Right intracranial ICA stenting. Skull: Normal. Negative for fracture or focal lesion. Sinuses/Orbits: Negative Other: These results were communicated to Dr. Leonel Ramsay at 1:35 pmon 8/22/2021by text page via the [facility] messaging system. ASPECTS [facility] Stroke Program Early CT Score) - Ganglionic level infarction (caudate, lentiform nuclei, internal capsule, insula, M1-M3 cortex): 7 - Supraganglionic infarction (M4-M6 cortex): 3 Total score (0-10 with 10 being normal): 10 IMPRESSION: 1. No acute finding. 2. 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 Electronically Signed   By: Monte Fantasia M.D.   On: 02/04/2020 13:38        Scheduled Meds: .  stroke: mapping our early stages of recovery book   Does not apply Once  . aspirin  300 mg Rectal Daily   Or  . aspirin  325 mg Oral Daily  . atorvastatin  80 mg Oral Daily  . enoxaparin (LOVENOX) injection  40 mg Subcutaneous Q24H  . folic acid  1 mg Oral Daily  . gabapentin  300 mg Oral BID  . multivitamin with minerals  1 tablet Oral Daily  . pantoprazole  40 mg Oral QAC breakfast  . thiamine  100 mg Oral Daily   Or  . thiamine  100 mg Intravenous Daily   Continuous Infusions: . sodium chloride 50 mL/hr at 02/05/20 2001  . levETIRAcetam 500 mg (02/06/20 1155)     LOS: 0 days     Cordelia Poche, MD [facility] 02/06/2020, 12:50 PM  If 7PM-7AM, please contact night-coverage [email or web address]

## 2020-02-06 NOTE — Evaluation (Signed)
Physical Therapy Evaluation Patient Details Name: Carlos Wise MRN: [id number] DOB: 26-May-1941 Today's Date: 02/06/2020   History of Present Illness  79 year old male with history of HTN, HLD, admitted to the hospital for confusion and dysarthria which was noted by Carlos Wise wife.  Upon admission CT of the head, CTA head and neck and MRI brain were negative  Clinical Impression  Pt admitted with/for confusion and dysarthria.  Imaging doesn't show an acute cause for this.  Further workup pending.  Pt needing minimal assist for all mobility at this time.  Pt currently limited functionally due to the problems listed. ( See problems list.)   Pt will benefit from PT to maximize function and safety in order to get ready for next venue listed below.     Follow Up Recommendations SNF;Other (comment) (progression toward HHPT, wife want rehab before home)    Equipment Recommendations  Other (comment) (TBA)    Recommendations for Other Services       Precautions / Restrictions Precautions Precautions: Fall      Mobility  Bed Mobility Overal bed mobility: Needs Assistance Bed Mobility: Supine to Sit     Supine to sit: Min assist Sit to supine: Supervision   General bed mobility comments: cues for direction, stability assist while pt doing the majority of work to come up and scoot to EOB  Transfers Overall transfer level: Needs assistance Equipment used: None Transfers: Sit to/from Stand Sit to Stand: Min assist         General transfer comment: cues for hand placement and stability assist as Carlos Wise comes forward and up.  Ambulation/Gait Ambulation/Gait assistance: Min assist Gait Distance (Feet): 10 Feet (then 50 additional feet after standing task at sink w/o EEG) Assistive device: IV Pole Gait Pattern/deviations: Step-through pattern Gait velocity: slower Gait velocity interpretation: <1.8 ft/sec, indicate of risk for recurrent falls General Gait Details: mildly unsteady, staggery  steps with several lists to the right needing the minimal assist to recover.  Stairs            Wheelchair Mobility    Modified Rankin (Stroke Patients Only)       Balance Overall balance assessment: Needs assistance Sitting-balance support: No upper extremity supported Sitting balance-Leahy Scale: Good       Standing balance-Leahy Scale: Fair Standing balance comment: washed face at the sink without support against the sink base with hand or body.                             Pertinent Vitals/Pain Pain Assessment: Faces Faces Pain Scale: No hurt    Home Living Family/patient expects to be discharged to:: Private residence Living Arrangements: Spouse/significant other Available Help at Discharge: Family;Available 24 hours/day Type of Home: House Home Access: Stairs to enter   [employer] of Steps: 2 Home Layout: Multi-level Home Equipment: Shower seat (stair lift)      Prior Function Level of Independence: Independent         Comments: drove; enjoys doing Runner, broadcasting/film/video Dominance   Dominant Hand: Right    Extremity/Trunk Assessment   Upper Extremity Assessment Upper Extremity Assessment: Generalized weakness    Lower Extremity Assessment Lower Extremity Assessment: Generalized weakness    Cervical / Trunk Assessment Cervical / Trunk Assessment: Normal  Communication   Communication: Receptive difficulties;Expressive difficulties  Cognition Arousal/Alertness: Awake/alert Behavior During Therapy: Flat affect Overall Cognitive Status: Impaired/Different from baseline Area of Impairment: Following commands;Safety/judgement;Awareness;Problem  solving;Attention                   Current Attention Level: Sustained   Following Commands: Follows one step commands inconsistently Safety/Judgement: Decreased awareness of safety;Decreased awareness of deficits Awareness: Intellectual Problem Solving: Slow  processing;Decreased initiation;Difficulty sequencing;Requires verbal cues;Requires tactile cues General Comments: HOH is also impairing Carlos Wise ability to follow commands and participate.      General Comments General comments (skin integrity, edema, etc.): vss, limited by EEG cable.    Exercises     Assessment/Plan    PT Assessment Patient needs continued PT services  PT Problem List Decreased strength;Decreased activity tolerance;Decreased balance;Decreased mobility;Decreased coordination;Decreased cognition;Decreased knowledge of use of DME;Decreased safety awareness       PT Treatment Interventions Gait training;Functional mobility training;Therapeutic activities;Balance training;Patient/family education;Stair training;DME instruction    PT Goals (Current goals can be found in the Care Plan section)  Acute Rehab PT Goals Patient Stated Goal: per wife to figure out what is wrong with Carlos Wise PT Goal Formulation: With patient/family Time For Goal Achievement: 02/20/20 Potential to Achieve Goals: Good    Frequency Min 3X/week   Barriers to discharge        Co-evaluation               AM-PAC PT "6 Clicks" Mobility  Outcome Measure Help needed turning from your back to your side while in a flat bed without using bedrails?: A Little Help needed moving from lying on your back to sitting on the side of a flat bed without using bedrails?: A Little Help needed moving to and from a bed to a chair (including a wheelchair)?: A Little Help needed standing up from a chair using your arms (e.g., wheelchair or bedside chair)?: None Help needed to walk in hospital room?: A Little Help needed climbing 3-5 steps with a railing? : A Lot 6 Click Score: 18    End of Session   Activity Tolerance: Patient tolerated treatment well Patient left: in chair;with call bell/phone within reach;with chair alarm set;with family/visitor present Nurse Communication: Mobility status PT Visit  Diagnosis: Unsteadiness on feet (R26.81);Muscle weakness (generalized) (M62.81)    Time: 2482-5003 PT Time Calculation (min) (ACUTE ONLY): 32 min   Charges:   PT Evaluation $PT Eval Moderate Complexity: 1 Mod PT Treatments $Gait Training: 8-22 mins        02/06/2020  Ginger Carne., PT Acute Rehabilitation Services [phone]  (pager) [phone]  (office)  Tessie Fass Jermall Isaacson 02/06/2020, 2:45 PM

## 2020-02-06 NOTE — Progress Notes (Signed)
Inpatient Rehabilitation Admissions Coordinator  I met at bedside with patient, sister in law and RN .Inpatient rehab consult received.  Patient HOH and hearing aides not present. Requested sister in law to bring hearing aides in. Patient lacks the medical neccesity for an inpt rehab admission. I recommend SNF and sister in law is aware. I will alert Dr. Lonny Prude, acute team and TOC. We will sign off at this time.  Danne Baxter, RN, MSN Rehab Admissions Coordinator [phone] 02/06/2020 12:07 PM

## 2020-02-06 NOTE — TOC CAGE-AID Note (Signed)
Transition of Care [facility]) - CAGE-AID Screening   Patient Details  Name: Carlos Wise MRN: [id number] Date of Birth: 1941-03-04  Transition of Care [facility]) CM/SW Contact:    Emeterio Reeve, [place] Phone Number: 02/06/2020, 2:48 PM   Clinical Narrative:  Pt unable to participate in assessment due to only being oriented to person. Please re consult once pt is oriented.   CAGE-AID Screening: Substance Abuse Screening unable to be completed due to: : Patient unable to participate            Providence Crosby Clinical Social Worker [phone]

## 2020-02-06 NOTE — Progress Notes (Signed)
LTM EEG discontinued - no skin breakdown at unhook.   

## 2020-02-06 NOTE — Progress Notes (Signed)
Occupational Therapy Treatment Patient Details Name: Carlos Wise MRN: [id number] DOB: 1940-09-13 Today's Date: 02/06/2020    History of present illness 79 year old male with history of HTN, HLD, admitted to the hospital for confusion and dysarthria which was noted by his wife.  Upon admission CT of the head, CTA head and neck and MRI brain were negative   OT comments  Pt progressing with OT goals, but continues to be limited by deficits in balance, safety, strength, and cognition. Pt demonstrated ability to stand at sink for grooming tasks min guard progressing to supervision, but noted with deficits in problem solving and bimanual fine motor tasks. Pt requires overall Min A for mobility to/from sink with one standing LOB noted with external support needed to correct. Balance improved with use of IV pole for stability. Noted that CIR denied pt, so DC recommendations updated to SNF for short term rehab prior to return home.    Follow Up Recommendations  SNF;Supervision/Assistance - 24 hour    Equipment Recommendations  None recommended by OT    Recommendations for Other Services      Precautions / Restrictions Precautions Precautions: Fall Restrictions Weight Bearing Restrictions: No       Mobility Bed Mobility Overal bed mobility: Needs Assistance Bed Mobility: Supine to Sit;Sit to Supine     Supine to sit: Supervision;HOB elevated Sit to supine: Supervision;HOB elevated   General bed mobility comments: Supervision, no assist needed  Transfers Overall transfer level: Needs assistance Equipment used: None Transfers: Sit to/from Stand Sit to Stand: Min guard         General transfer comment: min guard for sit to stand, use of IV improved stability     Balance Overall balance assessment: Needs assistance Sitting-balance support: No upper extremity supported Sitting balance-Leahy Scale: Fair Sitting balance - Comments: fair sitting balance statically, LOB when  leaning side to side for ADLs   Standing balance support: Single extremity supported;During functional activity Standing balance-Leahy Scale: Fair Standing balance comment: static standing at sink fair, dynamic balance LOB without UE support                           ADL either performed or assessed with clinical judgement   ADL Overall ADL's : Needs assistance/impaired     Grooming: Min guard;Standing;Oral care;Brushing hair;Wash/dry face Grooming Details (indicate cue type and reason): Pt min guard progressing to supervision at times standing at sink for oral care, brushing hair and washing face. Pt noted to drop and leave toothpaste tube in sink. Required cued to scan to R to locate toothpaste tube initially with task, noted with difficulty of bimanual fine motor tasks to place toothpaste on toothbrush         Upper Body Dressing : Minimal assistance;Sitting Upper Body Dressing Details (indicate cue type and reason): Min A for sitting EOB to don hospital gown around back, cues for safety and sequencing to remove. Pt noted to lean side to side to remove from under bottom - LOB                 Functional mobility during ADLs: Minimal assistance General ADL Comments: Pt require Min A for steadying with one anterior LOB walking back to bed without device, one LOB leaning side to side to pull gown from under bottom. Pt with slow processing, very agreeable to participate in therapy      Vision   Vision Assessment?: No apparent visual deficits  Perception     Praxis      Cognition Arousal/Alertness: Awake/alert Behavior During Therapy: Flat affect Overall Cognitive Status: Impaired/Different from baseline Area of Impairment: Following commands;Safety/judgement;Awareness;Problem solving;Attention                   Current Attention Level: Sustained   Following Commands: Follows one step commands with increased time Safety/Judgement: Decreased awareness  of safety;Decreased awareness of deficits Awareness: Intellectual Problem Solving: Slow processing;Decreased initiation;Difficulty sequencing;Requires verbal cues;Requires tactile cues General Comments: Pt with slow processing and slow motor movements, difficulty scanning and problem solving        Exercises     Shoulder Instructions       General Comments VSS on RA    Pertinent Vitals/ Pain       Pain Assessment: Faces Faces Pain Scale: No hurt  Home Living Family/patient expects to be discharged to:: Private residence Living Arrangements: Spouse/significant other Available Help at Discharge: Family;Available 24 hours/day Type of Home: House Home Access: Stairs to enter [employer] of Steps: 2   Home Layout: Multi-level     Bathroom Shower/Tub: Teacher, early years/pre: Standard Bathroom Accessibility: Yes   Home Equipment: Shower seat (stair lift)      Lives With: Spouse (elderly)    Prior Functioning/Environment Level of Independence: Independent        Comments: drove; enjoys doing Research officer, trade union  Min 2X/week        Progress Toward Goals  OT Goals(current goals can now be found in the care plan section)  Progress towards OT goals: Progressing toward goals  Acute Rehab OT Goals Patient Stated Goal: per wife to figure out what is wrong with her husband OT Goal Formulation: With patient/family Time For Goal Achievement: 02/19/20 Potential to Achieve Goals: Good ADL Goals Pt Will Perform Eating: with modified independence;sitting Pt Will Perform Grooming: sitting;with supervision Pt Will Perform Upper Body Bathing: sitting;with supervision Pt Will Perform Lower Body Bathing: sit to/from stand;with supervision Pt Will Transfer to Toilet: ambulating;with supervision Pt Will Perform Toileting - Clothing Manipulation and hygiene: sit to/from stand;with supervision  Plan Discharge plan needs to be updated     Co-evaluation                 AM-PAC OT "6 Clicks" Daily Activity     Outcome Measure   Help from another person eating meals?: A Little Help from another person taking care of personal grooming?: A Little Help from another person toileting, which includes using toliet, bedpan, or urinal?: Total Help from another person bathing (including washing, rinsing, drying)?: A Lot Help from another person to put on and taking off regular upper body clothing?: A Lot Help from another person to put on and taking off regular lower body clothing?: A Lot 6 Click Score: 13    End of Session Equipment Utilized During Treatment: Gait belt  OT Visit Diagnosis: Unsteadiness on feet (R26.81);Other abnormalities of gait and mobility (R26.89);Muscle weakness (generalized) (M62.81);Apraxia (R48.2);Other symptoms and signs involving cognitive function   Activity Tolerance Patient tolerated treatment well   Patient Left in bed;with call bell/phone within reach;with bed alarm set   Nurse Communication Mobility status        Time: 3734-2876 OT Time Calculation (min): 22 min  Charges: OT General Charges $OT Visit: 1 Visit OT Treatments $Self Care/Home Management : 8-22 mins  Layla Maw, OTR/L   Layla Maw 02/06/2020, 3:12 PM

## 2020-02-06 NOTE — Plan of Care (Signed)
Pt continues to be disoriented to time and situation. Will continue to monitor

## 2020-02-06 NOTE — Progress Notes (Signed)
STROKE TEAM PROGRESS NOTE   INTERVAL HISTORY No family at the bedside. I saw him 8:30am initially, he was lying in bed with both hand mittens on, hard to arouse. Once arouse, not following commands and nonverbal but some combative towards me with painful stimulation. LTM EEG running at that time. I saw him again around 1pm, he was sitting in chair, sleeping. EEG has discontinued. I woke him up and fed him lunch. He ate some mashed potato and beef and drank ice tea. He was able to say "good potato", "is this coffee"? Then he was able to answer orientation questions, knew in hospital, knew the month and year, but told me "57" instead of "39" for his age. Moving BUEs equally. Moving BLEs slightly. Attending to both sides. Hard of hearing, did not answer other questions. Finally, he said "thank you" before I left.   Vitals:   02/05/20 1940 02/05/20 2150 02/05/20 2311 02/06/20 0739  BP: 129/79 (!) 148/81 (!) 144/82 (!) 144/94  Pulse: 79 73 69 66  Resp: 16 16 19 19   Temp: 98.8 F (37.1 C) 98.5 F (36.9 C) 98.2 F (36.8 C) 98 F (36.7 C)  TempSrc: Oral Oral Oral Oral  SpO2: 95% 96% 97% 100%   CBC:  Recent Labs  Lab 02/04/20 1319 02/04/20 1328 02/05/20 0634 02/06/20 0157  WBC 12.2*   < > 11.8* 10.4  NEUTROABS 10.6*  --   --   --   HGB 15.2   < > 14.7 14.4  HCT 43.5   < > 41.6 41.2  MCV 91.6   < > 93.1 94.3  PLT 231   < > 261 224   < > = values in this interval not displayed.   Basic Metabolic Panel:  Recent Labs  Lab 02/04/20 1328 02/04/20 1419 02/05/20 0634 02/06/20 0157  NA   < >  --  131* 130*  K   < >  --  3.8 3.5  CL   < >  --  95* 97*  CO2   < >  --  23 24  GLUCOSE   < >  --  113* 93  BUN   < >  --  14 16  CREATININE   < >  --  0.88 0.97  CALCIUM   < >  --  9.6 9.3  MG  --  1.9  --  2.1  PHOS  --  3.6  --   --    < > = values in this interval not displayed.   Lipid Panel:  Recent Labs  Lab 02/04/20 1319  CHOL 274*  TRIG 98  HDL 61  CHOLHDL 4.5  VLDL 20   LDLCALC 193*   HgbA1c:  Recent Labs  Lab 02/04/20 1419  HGBA1C 5.6   Urine Drug Screen:  Recent Labs  Lab 02/05/20 1045  LABOPIA NONE DETECTED  COCAINSCRNUR NONE DETECTED  LABBENZ POSITIVE*  AMPHETMU NONE DETECTED  THCU NONE DETECTED  LABBARB NONE DETECTED    Alcohol Level  Recent Labs  Lab 02/04/20 1319  ETH <10    IMAGING past 24 hours EEG adult  Result Date: 02/05/2020 Lora Havens, MD     02/05/2020  7:44 PM Patient Name: Carlos Wise MRN: [id number] Epilepsy Attending: Lora Havens Referring Physician/Provider: Dr Roland Rack Date: 02/05/2020 Duration: 29.51 mins Patient history: 79 year old male with acute onset aphasia. EEG to evaluate for seizure. Level of alertness: Awake AEDs during EEG study: Gabapentin Technical aspects: This  EEG study was done with scalp electrodes positioned according to the 10-20 International system of electrode placement. Electrical activity was acquired at a sampling rate of 500Hz  and reviewed with a high frequency filter of 70Hz  and a low frequency filter of 1Hz . EEG data were recorded continuously and digitally stored. Description: The posterior dominant rhythm consists of 8.5 Hz activity of moderate voltage (25-35 uV) seen predominantly in posterior head regions, asymmetric ( L<R) and reactive to eye opening and eye closing. EEG showed continuous left hemispheric 3 to 6 Hz theta-delta slowing. Frequent sharp waves were seen in left temporal region as well. Hyperventilation and photic stimulation were not performed.   ABNORMALITY - Continuous slow, left hemispheric - Sharp wave, left temporal region - Background asymmetry, left <right IMPRESSION: This study is showed evidence of epileptogenicity arising from left temporal region as well as cortical dysfunction in left hemisphere likely secondary to underlying structural abnormality, post-ictal state. No seizures were seen throughout the recording. Priyanka Barbra Sarks    PHYSICAL  EXAM  Temp:  [98 F (36.7 C)-98.8 F (37.1 C)] 98 F (36.7 C) (08/24 0739) Pulse Rate:  [66-79] 66 (08/24 0739) Resp:  [14-19] 19 (08/24 0739) BP: (129-152)/(71-94) 144/94 (08/24 0739) SpO2:  [94 %-100 %] 100 % (08/24 0739)  General - Well nourished, well developed, sleepy but arousable.  Ophthalmologic - fundi not visualized due to noncooperation.  Cardiovascular - Regular rhythm and rate.  Neuro -initially sleepy, however arousable with voice, and awake during lunch. Hard of hearing, but orientated to place and time but not to his age. Able to speak short sentences but paucity of speech. Did not follow simple midline or peripheral commands, but able to pantomime. Able to track bilaterally, blinking with visual threat bilaterally.  PERRL, EOMI.  Facial symmetrical, tongue protrusion not corporative.  BUE 3+/5 spontaneously and BLE 2/5 on pain. Sensation, coordination not corporative and gait not tested.   ASSESSMENT/PLAN Carlos Wise is a 79 y.o. male with history of HTN, HLD who woke w/ confusion. Presenting with acute aphasia. LSW Saturday night before went to bed. Woke up Sunday 4am with speech difficulty and walking wobbly.   Likely seizure with prolonged post ictal - improving  Code Stroke CT head No acute abnormality. Small vessel disease. Atrophy. Ventriculomegaly progressed since 2007. ASPECTS 10.     CTA head & neck atherosclerosis throughout. Hx stent assisted R ICA aneurysm coiling.  CT perfusion no core or penumbra  MRI  No acute abnormality. Small vessel disease. Atrophy.   2D Echo EF 60-65%. No source of embolus   EEG and LTM EEG - no sz, L temporal sharp waves and left hemisphere slowing  Keppra load 2gm followed by 500 bid  LDL 193  HgbA1c 5.6  VTE prophylaxis - Lovenox 40 mg sq daily   aspirin 81 mg daily prior to admission, now on aspirin 325mg  daily.   Therapy recommendations:  SNF vs. CIR  Disposition:  CIR signed off   Hyponatremia - ??  Etiology for seizure  Not sure about baseline Na  Na 127->128->131->130  Per wife, pt two sisters had episodes of stroke-like symptoms with low Na. But no further details. Wife does not know Na levels of pt   Treatment per primary team  Hypertension  Stable . 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 On CIWA protocol  Other Active Problems  Hx prostate CA s/p XRT   Hx herpes zoster  Hx R ICA aneurysm s/p coiling   Leukocytosis WBC 12.2->11.8->10.4 - resolved  Prolonged QTC  GERD  Peripheral neuropathy  Hospital day # 0   Rosalin Hawking, MD PhD Stroke Neurology 02/06/2020 10:12 AM  Patient condition worsened within the last 24 hours, has developed persistent aphasia, lethargy with sleepiness, drowsiness, combative and agitation, continues to have aphasia, and I have ordered EEG, LTM EEG, keppra load and seizure precautions. I discussed with Dr. Hortense Ramal. I spent  35 minutes in total face-to-face time with the patient, more than 50% of which was spent in counseling and coordination of care, reviewing test results, images and medication, and discussing the diagnosis, treatment plan and potential prognosis. This patient's care requiresreview of multiple databases, neurological assessment, discussion with family, other specialists and medical decision making of high complexity.  To contact Stroke Continuity provider, please refer to [email or web address] After hours, contact General Neurology

## 2020-02-06 NOTE — Care Management Obs Status (Signed)
MEDICARE OBSERVATION STATUS NOTIFICATION   Patient Details  Name: Carlos Wise MRN: [id number] Date of Birth: 03-15-1941   Medicare Observation Status Notification Given:  Yes    Joanne Chars, LCSW 02/06/2020, 9:37 AM

## 2020-02-06 NOTE — Procedures (Signed)
Patient Name: Carlos Wise  MRN: [id number]  Epilepsy Attending: Lora Havens  Referring Physician/Provider: Dr Rosalin Hawking Duration: 02/05/2020 2044 to 02/06/2020 1142  Patient history: 79 year old male with acute onset aphasia. EEG to evaluate for seizure.   Level of alertness: Awake, asleep  AEDs during EEG study: Gabapentin  Technical aspects: This EEG study was done with scalp electrodes positioned according to the 10-20 International system of electrode placement. Electrical activity was acquired at a sampling rate of 500Hz  and reviewed with a high frequency filter of 70Hz  and a low frequency filter of 1Hz . EEG data were recorded continuously and digitally stored.   Description: The posterior dominant rhythm consists of 8.5 Hz activity of moderate voltage (25-35 uV) seen predominantly in posterior head regions, symmetric and reactive to eye opening and eye closing. Sleep was characterized by vertex waves, sleep spindles (14-16hz ), maximal frontocentral region. EEG showed continuous left hemispheric 3 to 6 Hz theta-delta slowing. Frequent sharp waves were seen in left temporal region as well. Hyperventilation and photic stimulation were not performed.     ABNORMALITY - Continuous slow, left hemispheric - Sharp wave, left temporal region  IMPRESSION: This study is showed evidence of epileptogenicity arising from left temporal region as well as cortical dysfunction in left hemisphere likely secondary to underlying structural abnormality, post-ictal state. No seizures were seen throughout the recording.  Farzana Koci Barbra Sarks

## 2020-02-07 DIAGNOSIS — R2990 Unspecified symptoms and signs involving the nervous system: Secondary | ICD-10-CM | POA: Diagnosis not present

## 2020-02-07 DIAGNOSIS — K219 Gastro-esophageal reflux disease without esophagitis: Secondary | ICD-10-CM | POA: Diagnosis present

## 2020-02-07 DIAGNOSIS — G629 Polyneuropathy, unspecified: Secondary | ICD-10-CM | POA: Diagnosis present

## 2020-02-07 DIAGNOSIS — E7800 Pure hypercholesterolemia, unspecified: Secondary | ICD-10-CM | POA: Diagnosis not present

## 2020-02-07 DIAGNOSIS — Z20822 Contact with and (suspected) exposure to covid-19: Secondary | ICD-10-CM | POA: Diagnosis not present

## 2020-02-07 DIAGNOSIS — Z8249 Family history of ischemic heart disease and other diseases of the circulatory system: Secondary | ICD-10-CM | POA: Diagnosis not present

## 2020-02-07 DIAGNOSIS — R9431 Abnormal electrocardiogram [ECG] [EKG]: Secondary | ICD-10-CM | POA: Diagnosis not present

## 2020-02-07 DIAGNOSIS — F1010 Alcohol abuse, uncomplicated: Secondary | ICD-10-CM | POA: Diagnosis present

## 2020-02-07 DIAGNOSIS — R4701 Aphasia: Secondary | ICD-10-CM | POA: Diagnosis not present

## 2020-02-07 DIAGNOSIS — R9401 Abnormal electroencephalogram [EEG]: Secondary | ICD-10-CM | POA: Diagnosis present

## 2020-02-07 DIAGNOSIS — R410 Disorientation, unspecified: Secondary | ICD-10-CM | POA: Diagnosis not present

## 2020-02-07 DIAGNOSIS — R471 Dysarthria and anarthria: Secondary | ICD-10-CM | POA: Diagnosis not present

## 2020-02-07 DIAGNOSIS — R4182 Altered mental status, unspecified: Secondary | ICD-10-CM | POA: Diagnosis not present

## 2020-02-07 DIAGNOSIS — M2550 Pain in unspecified joint: Secondary | ICD-10-CM | POA: Diagnosis not present

## 2020-02-07 DIAGNOSIS — Y900 Blood alcohol level of less than 20 mg/100 ml: Secondary | ICD-10-CM | POA: Diagnosis present

## 2020-02-07 DIAGNOSIS — D72829 Elevated white blood cell count, unspecified: Secondary | ICD-10-CM | POA: Diagnosis not present

## 2020-02-07 DIAGNOSIS — R569 Unspecified convulsions: Secondary | ICD-10-CM | POA: Diagnosis not present

## 2020-02-07 DIAGNOSIS — E785 Hyperlipidemia, unspecified: Secondary | ICD-10-CM | POA: Diagnosis present

## 2020-02-07 DIAGNOSIS — H9190 Unspecified hearing loss, unspecified ear: Secondary | ICD-10-CM | POA: Diagnosis present

## 2020-02-07 DIAGNOSIS — Z8042 Family history of malignant neoplasm of prostate: Secondary | ICD-10-CM | POA: Diagnosis not present

## 2020-02-07 DIAGNOSIS — Z87891 Personal history of nicotine dependence: Secondary | ICD-10-CM | POA: Diagnosis not present

## 2020-02-07 DIAGNOSIS — I110 Hypertensive heart disease with heart failure: Secondary | ICD-10-CM | POA: Diagnosis present

## 2020-02-07 DIAGNOSIS — G40909 Epilepsy, unspecified, not intractable, without status epilepticus: Secondary | ICD-10-CM | POA: Diagnosis not present

## 2020-02-07 DIAGNOSIS — E871 Hypo-osmolality and hyponatremia: Secondary | ICD-10-CM | POA: Diagnosis not present

## 2020-02-07 DIAGNOSIS — R279 Unspecified lack of coordination: Secondary | ICD-10-CM | POA: Diagnosis present

## 2020-02-07 DIAGNOSIS — R69 Illness, unspecified: Secondary | ICD-10-CM | POA: Diagnosis not present

## 2020-02-07 DIAGNOSIS — I5032 Chronic diastolic (congestive) heart failure: Secondary | ICD-10-CM | POA: Diagnosis not present

## 2020-02-07 DIAGNOSIS — Z7982 Long term (current) use of aspirin: Secondary | ICD-10-CM | POA: Diagnosis not present

## 2020-02-07 DIAGNOSIS — Z923 Personal history of irradiation: Secondary | ICD-10-CM | POA: Diagnosis not present

## 2020-02-07 DIAGNOSIS — G9341 Metabolic encephalopathy: Secondary | ICD-10-CM | POA: Diagnosis not present

## 2020-02-07 DIAGNOSIS — R531 Weakness: Secondary | ICD-10-CM | POA: Diagnosis not present

## 2020-02-07 DIAGNOSIS — Z7289 Other problems related to lifestyle: Secondary | ICD-10-CM | POA: Diagnosis not present

## 2020-02-07 DIAGNOSIS — Z7401 Bed confinement status: Secondary | ICD-10-CM | POA: Diagnosis not present

## 2020-02-07 DIAGNOSIS — Z880 Allergy status to penicillin: Secondary | ICD-10-CM | POA: Diagnosis not present

## 2020-02-07 DIAGNOSIS — Z8546 Personal history of malignant neoplasm of prostate: Secondary | ICD-10-CM | POA: Diagnosis not present

## 2020-02-07 LAB — COMPREHENSIVE METABOLIC PANEL
ALT: 23 U/L (ref 0–44)
AST: 40 U/L (ref 15–41)
Albumin: 3.4 g/dL — ABNORMAL LOW (ref 3.5–5.0)
Alkaline Phosphatase: 47 U/L (ref 38–126)
Anion gap: 8 (ref 5–15)
BUN: 18 mg/dL (ref 8–23)
CO2: 24 mmol/L (ref 22–32)
Calcium: 9 mg/dL (ref 8.9–10.3)
Chloride: 101 mmol/L (ref 98–111)
Creatinine, Ser: 0.96 mg/dL (ref 0.61–1.24)
GFR calc Af Amer: >60 mL/min (ref >60)
GFR calc non Af Amer: >60 mL/min (ref >60)
Glucose, Bld: 130 mg/dL — ABNORMAL HIGH (ref 70–99)
Potassium: 3.7 mmol/L (ref 3.5–5.1)
Sodium: 133 mmol/L — ABNORMAL LOW (ref 135–145)
Total Bilirubin: 0.9 mg/dL (ref 0.3–1.2)
Total Protein: 6.1 g/dL — ABNORMAL LOW (ref 6.5–8.1)

## 2020-02-07 LAB — CBC
HCT: 39.1 % (ref 39.0–52.0)
Hemoglobin: 13.6 g/dL (ref 13.0–17.0)
MCH: 32.8 pg (ref 26.0–34.0)
MCHC: 34.8 g/dL (ref 30.0–36.0)
MCV: 94.2 fL (ref 80.0–100.0)
Platelets: 207 10*3/uL (ref 150–400)
RBC: 4.15 MIL/uL — ABNORMAL LOW (ref 4.22–5.81)
RDW: 12.1 % (ref 11.5–15.5)
WBC: 8.4 10*3/uL (ref 4.0–10.5)
nRBC: 0 % (ref 0.0–0.2)

## 2020-02-07 LAB — MAGNESIUM: Magnesium: 2.1 mg/dL (ref 1.7–2.4)

## 2020-02-07 NOTE — Progress Notes (Signed)
  Speech Language Pathology Treatment: Dysphagia;Cognitive-Linquistic  Patient Details Name: Carlos Wise MRN: [id number] DOB: February 07, 1941 Today's Date: 02/07/2020 Time: 80-     Assessment / Plan / Recommendation Clinical Impression  Significant improvements today in all areas of cognition/communication and swallow. Responses are intermittently delayed, suspecting more from his hearing loss (hearing aids are at home) and some from processing speed. His responses are accurate if he hears question clearly. He named single objects independently and accurately. Wrote his address although this does not match current in computer. He was able to read packing of graham cracker on right side of vision. He was standing up in bathroom after RN tech requested he call when finishing urinating. He needed therapist to repeat what nurse was telling him from a hearing standpint. ST will continue for cognitive deficits to decrease assist needed.  No signs of aspiration, pharyngeal retention with multiple straw sips thin and graham cracker. Adequate oral clearance. Recommend continue regular/thin and no f/u needed for swallow.    HPI HPI: 79 year old male with acute onset aphasia. Neurologist suspects a right visual field cut and right hypoesthesia.  Per neurologist, his symptoms are most consistent with stroke, and given the negative EEG, even though the CT perfusion was negative, he suspects that he has had an acute ischemic stroke.  However, MRI on 8/22 was also negative. PMH includes medical history significant of hypertension, hyperlipidemia, ICA aneurysm s/p coiling , and prostate cancer s/p radiation.       SLP Plan  Continue with current plan of care       Recommendations  Diet recommendations: Regular;Thin liquid Liquids provided via: Cup;Straw Medication Administration: Whole meds with liquid Supervision: Patient able to self feed Compensations: Slow rate;Small sips/bites Postural Changes and/or  Swallow Maneuvers: Seated upright 90 degrees                Oral Care Recommendations: Oral care BID Follow up Recommendations: Other (comment) (no f/u for swallow, TBD for cognition/comm) SLP Visit Diagnosis: Dysphagia, unspecified (R13.10);Cognitive communication deficit (N17.001) Plan: Continue with current plan of care       GO                Houston Siren 02/07/2020, 11:15 AM

## 2020-02-07 NOTE — TOC Initial Note (Signed)
Transition of Care [facility]) - Initial/Assessment Note    Patient Details  Name: Carlos Wise MRN: [id number] Date of Birth: Dec 09, 1940  [facility] CM/SW Contact:    Joanne Chars, LCSW Phone Number: 02/07/2020, 10:09 AM  Clinical Narrative:      CSW met with pt to discuss discharge plan.  Pt initially declined SNF recommendation.  Pt gave permission to speak with wife and when CSW spoke with her, she reports they live alone with no outside help.  They do not have children or family support. She would like pt to go to SNF as she can't physically help him in the home.  CSW again spoke with pt about this and he was agreeable to SNF.  Pt reports he is vaccinated.  Does have PCP in place. CSW addressed alcohol issues as well.  Pt reports he has two vodka drinks per day and denies that his alcohol use is causing him any problems.  CSW discussed this with wife as well and she also reports that they each drink two drinks per day and she does not feel like her husbands drinking is a problem at this time.              Expected Discharge Plan: [place]     Patient Goals and CMS Choice Patient states their goals for this hospitalization and ongoing recovery are:: stay healthy CMS Medicare.gov Compare Post Acute Care list provided to:: Patient Choice offered to / list presented to : Patient  Expected Discharge Plan and Services Expected Discharge Plan: [place] Choice: [place] arrangements for the past 2 months: Single Family Home                                      Prior Living Arrangements/Services Living arrangements for the past 2 months: Single Family Home Lives with:: Spouse Patient language and need for interpreter reviewed:: Yes Do you feel safe going back to the place where you live?: Yes      Need for Family Participation in Patient Care: Yes (Comment) Care giver support system in  place?: Yes (comment)   Criminal Activity/Legal Involvement Pertinent to Current Situation/Hospitalization: No - Comment as needed  Activities of Daily Living      Permission Sought/Granted Permission sought to share information with : Facility Sport and exercise psychologist, Family Supports    Share Information with NAME: [name], wife  Permission granted to share info w AGENCY: SNF        Emotional Assessment Appearance:: Appears stated age Attitude/Demeanor/Rapport: Lethargic Affect (typically observed): Pleasant Orientation: : Oriented to Self, Oriented to Place, Oriented to  Time, Oriented to Situation Alcohol / Substance Use: Alcohol Use Psych Involvement: No (comment)  Admission diagnosis:  Aphasia [R47.01] Stroke-like symptoms [R29.90] Patient Active Problem List   Diagnosis Date Noted  . Aphasia 02/04/2020  . Leukocytosis 02/04/2020  . Hyponatremia 02/04/2020  . Prolonged QT interval 02/04/2020  . 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 Malignant neoplasm of prostate ([place]) 07/24/2014   PCP:  [name], MD Pharmacy:   [facility], [address] [place] [address] Phone: [phone] Fax: [phone]  [id number] - [place], [place] [address] Phone: [phone] Fax: [phone]  Social Determinants of Health (SDOH) Interventions    Readmission Risk Interventions No flowsheet data found.

## 2020-02-07 NOTE — NC FL2 (Signed)
[place] LEVEL OF CARE SCREENING TOOL     IDENTIFICATION  Patient Name: Carlos Wise Birthdate: 1940/07/17 Sex: male Admission Date (Current Location): 02/04/2020  [facility] and [place] Number:  Herbalist and Address:  [facility] [place] [place] [address] [place], [place] [address]      Provider Number: [id number]  Attending Physician Name and Address:  Hosie Poisson, MD  Relative Name and Phone Number:  Soren Lazarz, [phone]    Current Level of Care: Hospital Recommended Level of Care: [place] Prior Approval Number:    Date Approved/Denied:   PASRR Number: [id number]  Discharge Plan: SNF    Current Diagnoses: Patient Active Problem List   Diagnosis Date Noted  . Aphasia 02/04/2020  . Leukocytosis 02/04/2020  . Hyponatremia 02/04/2020  . Prolonged QT interval 02/04/2020  . 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 Malignant neoplasm of prostate ([place]) 07/24/2014    Orientation RESPIRATION BLADDER Height & Weight     Self, Time, Situation, Place  Normal Incontinent Weight:   Height:     BEHAVIORAL SYMPTOMS/MOOD NEUROLOGICAL BOWEL NUTRITION STATUS      Continent Diet (regular diet.  See discharge summary)  AMBULATORY STATUS COMMUNICATION OF NEEDS Skin   Limited Assist Verbally Normal                       Personal Care Assistance Level of Assistance  Bathing, Feeding, Dressing Bathing Assistance: Limited assistance Feeding assistance: Limited assistance Dressing Assistance: Limited assistance     Functional Limitations Info  Sight, Hearing, Speech Sight Info: Adequate Hearing Info: Adequate Speech Info: Adequate    SPECIAL CARE FACTORS FREQUENCY  PT (By licensed PT), OT (By licensed OT)     PT Frequency: 5x week OT Frequency: 5x week            Contractures Contractures Info: Not present    Additional Factors Info  Code Status, Allergies Code Status Info: full Allergies Info:  Penicillins           Current Medications (02/07/2020):  This is the current hospital active medication list Current Facility-Administered Medications  Medication Dose Route Frequency Provider Last Rate Last Admin  .  stroke: mapping our early stages of recovery book   Does not apply Once Tamala Julian, Rondell A, MD      . 0.9 %  sodium chloride infusion   Intravenous Continuous Norval Morton, MD 50 mL/hr at 02/06/20 1953 New Bag at 02/06/20 1953  . acetaminophen (TYLENOL) tablet 650 mg  650 mg Oral Q4H PRN Fuller Plan A, MD       Or  . acetaminophen (TYLENOL) 160 MG/5ML solution 650 mg  650 mg Per Tube Q4H PRN Fuller Plan A, MD       Or  . acetaminophen (TYLENOL) suppository 650 mg  650 mg Rectal Q4H PRN Fuller Plan A, MD      . amLODipine (NORVASC) tablet 2.5 mg  2.5 mg Oral Daily Mariel Aloe, MD   2.5 mg at 02/06/20 1608  . aspirin suppository 300 mg  300 mg Rectal Daily Tamala Julian, Rondell A, MD   300 mg at 02/05/20 1042   Or  . aspirin tablet 325 mg  325 mg Oral Daily Tamala Julian, Rondell A, MD   325 mg at 02/06/20 1159  . atorvastatin (LIPITOR) tablet 80 mg  80 mg Oral Daily Amin, Ankit Chirag, MD   80 mg at 02/06/20 1204  . enoxaparin (  LOVENOX) injection 40 mg  40 mg Subcutaneous Q24H Fuller Plan A, MD   40 mg at 02/06/20 2053  . fluticasone (FLONASE) 50 MCG/ACT nasal spray 2 spray  2 spray Each Nare Daily PRN Amin, Ankit Chirag, MD      . folic acid (FOLVITE) tablet 1 mg  1 mg Oral Daily Smith, Rondell A, MD      . gabapentin (NEURONTIN) capsule 300 mg  300 mg Oral BID Amin, Ankit Chirag, MD   300 mg at 02/06/20 2053  . hydrALAZINE (APRESOLINE) injection 10 mg  10 mg Intravenous Q4H PRN Tamala Julian, Rondell A, MD      . levETIRAcetam (KEPPRA) IVPB 500 mg/100 mL premix  500 mg Intravenous Q12H Rosalin Hawking, MD 400 mL/hr at 02/07/20 0829 500 mg at 02/07/20 0829  . LORazepam (ATIVAN) tablet 1-4 mg  1-4 mg Oral Q1H PRN Norval Morton, MD       Or  . LORazepam (ATIVAN) injection 1-4 mg   1-4 mg Intravenous Q1H PRN Fuller Plan A, MD   2 mg at 02/05/20 2353  . multivitamin with minerals tablet 1 tablet  1 tablet Oral Daily Smith, Rondell A, MD      . pantoprazole (PROTONIX) EC tablet 40 mg  40 mg Oral QAC breakfast Amin, Ankit Chirag, MD   40 mg at 02/07/20 0827  . polyethylene glycol (MIRALAX / GLYCOLAX) packet 17 g  17 g Oral Daily PRN Mariel Aloe, MD      . senna-docusate (Senokot-S) tablet 2 tablet  2 tablet Oral QHS PRN Amin, Ankit Chirag, MD      . thiamine tablet 100 mg  100 mg Oral Daily Smith, Rondell A, MD       Or  . thiamine (B-1) injection 100 mg  100 mg Intravenous Daily Fuller Plan A, MD   100 mg at 02/06/20 1159     Discharge Medications: Please see discharge summary for a list of discharge medications.  Relevant Imaging Results:  Relevant Lab Results:   Additional Information SSN [id number]  Joanne Chars, LCSW

## 2020-02-07 NOTE — Progress Notes (Signed)
STROKE TEAM PROGRESS NOTE   INTERVAL HISTORY Wife at bedside. Pt sitting in chair, eating lunch. He is pleasant and talking well. His wife admitted that pt is 100% back to himself. However, I told wife that pt seemed to have seizure with prolonged postictal and he can not drive until seizure free for 6 months. Then wife in tears since she cannot drive for herself and she has no one to help them. I relayed info to SW to seek for help.    Vitals:   02/06/20 1623 02/06/20 2115 02/07/20 0746 02/07/20 1651  BP: 135/78 119/90 (!) 154/90 116/73  Pulse:  75 67 88  Resp: 16 20 19 19   Temp: 98.7 F (37.1 C) (!) 97.2 F (36.2 C) 97.8 F (36.6 C) 97.8 F (36.6 C)  TempSrc: Oral Axillary    SpO2: 98% 97% 99% 97%   CBC:  Recent Labs  Lab 02/04/20 1319 02/04/20 1328 02/06/20 0157 02/07/20 0049  WBC 12.2*   < > 10.4 8.4  NEUTROABS 10.6*  --   --   --   HGB 15.2   < > 14.4 13.6  HCT 43.5   < > 41.2 39.1  MCV 91.6   < > 94.3 94.2  PLT 231   < > 224 207   < > = values in this interval not displayed.   Basic Metabolic Panel:  Recent Labs  Lab 02/04/20 1419 02/05/20 0634 02/06/20 0157 02/07/20 0049  NA  --    < > 130* 133*  K  --    < > 3.5 3.7  CL  --    < > 97* 101  CO2  --    < > 24 24  GLUCOSE  --    < > 93 130*  BUN  --    < > 16 18  CREATININE  --    < > 0.97 0.96  CALCIUM  --    < > 9.3 9.0  MG 1.9  --  2.1 2.1  PHOS 3.6  --   --   --    < > = values in this interval not displayed.   Lipid Panel:  Recent Labs  Lab 02/04/20 1319  CHOL 274*  TRIG 98  HDL 61  CHOLHDL 4.5  VLDL 20  LDLCALC 193*   HgbA1c:  Recent Labs  Lab 02/04/20 1419  HGBA1C 5.6   Urine Drug Screen:  Recent Labs  Lab 02/05/20 1045  LABOPIA NONE DETECTED  COCAINSCRNUR NONE DETECTED  LABBENZ POSITIVE*  AMPHETMU NONE DETECTED  THCU NONE DETECTED  LABBARB NONE DETECTED    Alcohol Level  Recent Labs  Lab 02/04/20 1319  ETH <10    IMAGING past 24 hours No results found.  PHYSICAL  EXAM  Temp:  [97.2 F (36.2 C)-97.8 F (36.6 C)] 97.8 F (36.6 C) (08/25 1651) Pulse Rate:  [67-88] 88 (08/25 1651) Resp:  [19-20] 19 (08/25 1651) BP: (116-154)/(73-90) 116/73 (08/25 1651) SpO2:  [97 %-99 %] 97 % (08/25 1651)  General - Well nourished, well developed, not in acute distress  Ophthalmologic - fundi not visualized due to noncooperation.  Cardiovascular - Regular rhythm and rate.  Neuro - awake alert, hard of hearing, but orientated to place, time, people and age. Able to speak short sentences, and following all simple commands. Able to track bilaterally, visual field full, no gaze preference.  PERRL, EOMI.  Facial symmetrical, tongue protrusion midline.  BUE 4/5 and BLE 3/5 at least. Sensation symmetrical,  coordination grossly intact bilaterally and gait not tested.   ASSESSMENT/PLAN [name] is a 79 y.o. male with history of HTN, HLD who woke w/ confusion. Presenting with acute aphasia. LKW Saturday night before went to bed. Woke up Sunday 4am with speech difficulty and walking wobbly.   Likely seizure with prolonged post ictal - resolved  Code Stroke CT head No acute abnormality. Small vessel disease. Atrophy. Ventriculomegaly progressed since 2007. ASPECTS 10.     CTA head & neck atherosclerosis throughout. Hx stent assisted R ICA aneurysm coiling.  CT perfusion no core or penumbra  MRI  No acute abnormality. Small vessel disease. Atrophy.   2D Echo EF 60-65%. No source of embolus   EEG and LTM EEG - no sz, L temporal sharp waves and left hemisphere slowing  Keppra load 2gm followed by 500 bid - continue on discharge  LDL 193  HgbA1c 5.6  VTE prophylaxis - Lovenox 40 mg sq daily   aspirin 81 mg daily prior to admission, now on aspirin 325mg  daily.   Therapy recommendations:  SNF  Disposition:  pending   No drive until seizure free for 6 months and under physician care. Wife requested help for daily activity since she can not drive - SW  notified.  Hyponatremia - ?? Etiology for seizure  Not sure about baseline Na  Na 127->128->131->130->133   Per wife, pt two sisters had episodes of stroke-like symptoms with low Na. But no further details. 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 On CIWA protocol  Chronic diastolic CHF  Other Active Problems  Hx prostate CA s/p XRT   Hx herpes zoster  Hx R ICA aneurysm s/p coiling   Leukocytosis WBC 12.2->11.8->10.4->8.4 - resolved  Prolonged QTC  GERD  Peripheral neuropathy  Hospital day # 0  Neurology will sign off. Please call with questions. Pt will follow up with stroke clinic [facility] at [facility] in about 4 weeks. Thanks for the consult.   Rosalin Hawking, MD PhD Stroke Neurology 02/07/2020 5:33 PM      To contact Stroke Continuity provider, please refer to [email or web address] After hours, contact General Neurology

## 2020-02-07 NOTE — Progress Notes (Signed)
PROGRESS NOTE    CEJAY Carlos Wise  FIE:[id number] DOB: 27-Aug-1940 DOA: 02/04/2020 PCP: [name], MD    Chief Complaint  Patient presents with  . Code Stroke    Brief Narrative:  79 year old male prior history of hypertension, hyperlipidemia admitted to the hospital for confusion and dysarthria.  He was initially evaluated for stroke in which was negative.  Neurology was consulted and he underwent continuous EEG to evaluate for seizures. Assessment & Plan:   Principal Problem:   Aphasia Active Problems:   Malignant neoplasm of prostate (HCC)   Leukocytosis   Hyponatremia   Prolonged QT interval   Alcohol use  Dysarthria /acute metabolic encephalopathy Unclear etiology initially thought to be secondary to prolonged postictal state.  Neurology recommended continuous EEG.  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  No seizures were seen throughout the reading. MRI of the brain is negative for acute stroke, CT area of the head and neck did not show acute intracranial abnormalities. Neurology recommended to continue with Keppra 500 mg twice daily and aspirin 325 mg daily. Hemoglobin A1c is 5.6 and LDL is 193.,  Continue with Lipitor 80 mg daily.    Essential hypertension Well-controlled blood pressure parameters.  Prolonged QTC Appears to have be resolved on repeat EKG.  Next    Hyperlipidemia Continue with Lipitor. Next   Chronic diastolic heart failure Patient appears to be compensated at this time.    Peripheral neuropathy Patient is on gabapentin continue the same.   GERD Stable. . Alcohol abuse Continue with CIWA.  History of prostate cancer S/p radiation Outpatient follow-up with urology.     DVT prophylaxis: Lovenox. Code Status: full code.  Family Communication: none at bedside.   Disposition:   Status is: Observation  The patient will require care spanning > 2 midnights and should be moved to inpatient because: Unsafe d/c plan  Dispo: The patient is from: Home              Anticipated d/c is to: SNF              Anticipated d/c date is: 1 day              Patient currently is not medically stable to d/c.       Consultants:   None.    Procedures: none.    Antimicrobials: none    Subjective: No new complaints.   Objective: Vitals:   02/06/20 0739 02/06/20 1623 02/06/20 2115 02/07/20 0746  BP: (!) 144/94 135/78 119/90 (!) 154/90  Pulse: 66  75 67  Resp: 19 16 20 19   Temp: 98 F (36.7 C) 98.7 F (37.1 C) (!) 97.2 F (36.2 C) 97.8 F (36.6 C)  TempSrc: Oral Oral Axillary   SpO2: 100% 98% 97% 99%    Intake/Output Summary (Last 24 hours) at 02/07/2020 1412 Last data filed at 02/07/2020 1216 Gross per 24 hour  Intake 300 ml  Output 1140 ml  Net -840 ml   There were no vitals filed for this visit.  Examination:  General exam: Appears calm and comfortable  Respiratory system: Clear to auscultation. Respiratory effort normal. Cardiovascular system: S1 & S2 heard, RRR. No JVD, No pedal edema. Gastrointestinal system: Abdomen is nondistended, soft and nontender.  Normal bowel sounds heard. Central nervous system: Alert and oriented. No focal neurological deficits. Extremities: Symmetric 5  x 5 power. Skin: No rashes, lesions or ulcers Psychiatry:  Mood & affect appropriate.     Data Reviewed: I have personally reviewed following labs and imaging studies  CBC: Recent Labs  Lab 02/04/20 1319 02/04/20 1328 02/05/20 0634 02/06/20 0157 02/07/20 0049  WBC 12.2*  --  11.8* 10.4 8.4  NEUTROABS 10.6*  --   --   --   --   HGB 15.2 15.3 14.7 14.4 13.6  HCT 43.5 45.0 41.6 41.2 39.1  MCV 91.6  --  93.1 94.3 94.2  PLT 231  --  261 224 829    Basic Metabolic Panel: Recent Labs  Lab 02/04/20 1319 02/04/20 1328 02/04/20 1419  02/05/20 0634 02/06/20 0157 02/07/20 0049  NA 127* 128*  --  131* 130* 133*  K 4.2 4.2  --  3.8 3.5 3.7  CL 92* 94*  --  95* 97* 101  CO2 21*  --   --  23 24 24   GLUCOSE 154* 156*  --  113* 93 130*  BUN 10 10  --  14 16 18   CREATININE 0.90 0.80  --  0.88 0.97 0.96  CALCIUM 9.5  --   --  9.6 9.3 9.0  MG  --   --  1.9  --  2.1 2.1  PHOS  --   --  3.6  --   --   --     GFR: CrCl cannot be calculated (Unknown ideal weight.).  Liver Function Tests: Recent Labs  Lab 02/04/20 1319 02/05/20 0634 02/06/20 0157 02/07/20 0049  AST 23 28 46* 40  ALT 17 16 20 23   ALKPHOS 66 55 50 47  BILITOT 1.7* 1.9* 1.6* 0.9  PROT 7.6 7.4 6.8 6.1*  ALBUMIN 4.3 4.1 3.9 3.4*    CBG: Recent Labs  Lab 02/04/20 1319 02/04/20 1555  GLUCAP 165* 153*     Recent Results (from the past 240 hour(s))  SARS Coronavirus 2 by RT PCR (hospital order, performed in [facility] hospital lab) Nasopharyngeal Nasopharyngeal Swab     Status: None   Collection Time: 02/04/20  1:28 PM   Specimen: Nasopharyngeal Swab  Result Value Ref Range Status   SARS Coronavirus 2 NEGATIVE NEGATIVE Final    Comment: (NOTE) SARS-CoV-2 target nucleic acids are NOT DETECTED.  The SARS-CoV-2 RNA is generally detectable in upper and lower respiratory specimens during the acute phase of infection. The lowest concentration of SARS-CoV-2 viral copies this assay can detect is 250 copies / mL. A negative result does not preclude SARS-CoV-2 infection and should not be used as the sole basis for treatment or other patient management decisions.  A negative result may occur with improper specimen collection / handling, submission of specimen other than nasopharyngeal swab, presence of viral mutation(s) within the areas targeted by this assay, and inadequate number of viral copies (<250 copies / mL). A negative result must be combined with clinical observations, patient history, and epidemiological information.  Fact Sheet for  Patients:   StrictlyIdeas.no  Fact Sheet for Healthcare Providers: BankingDealers.co.za  This test is not yet approved or  cleared by the [place] FDA and has been authorized for detection and/or diagnosis of SARS-CoV-2 by FDA under an Emergency Use Authorization (EUA).  This EUA will remain in effect (meaning this test can be used) for the duration of the COVID-19 declaration under Section 564(b)(1) of the Act, 21 U.S.C. section 360bbb-3(b)(1), unless the authorization is terminated or revoked sooner.  Performed at [facility]  Lab, [address] [place], [place] [address]          Radiology Studies: EEG adult  Result Date: 02-27-2020 Lora Havens, MD     February 27, 2020  7:44 PM Patient Name: LACY TAGLIERI MRN: [id number] Epilepsy Attending: Lora Havens Referring Physician/Provider: Dr Roland Rack Date: 27-Feb-2020 Duration: 29.51 mins Patient history: 79 year old male with acute onset aphasia. EEG to evaluate for seizure. Level of alertness: Awake AEDs during EEG study: Gabapentin Technical aspects: This EEG study was done with scalp electrodes positioned according to the 10-20 International system of electrode placement. Electrical activity was acquired at a sampling rate of 500Hz  and reviewed with a high frequency filter of 70Hz  and a low frequency filter of 1Hz . EEG data were recorded continuously and digitally stored. Description: The posterior dominant rhythm consists of 8.5 Hz activity of moderate voltage (25-35 uV) seen predominantly in posterior head regions, asymmetric ( L<R) and reactive to eye opening and eye closing. EEG showed continuous left hemispheric 3 to 6 Hz theta-delta slowing. Frequent sharp waves were seen in left temporal region as well. Hyperventilation and photic stimulation were not performed.   ABNORMALITY - Continuous slow, left hemispheric - Sharp wave, left temporal region - Background asymmetry,  left <right IMPRESSION: This study is showed evidence of epileptogenicity arising from left temporal region as well as cortical dysfunction in left hemisphere likely secondary to underlying structural abnormality, post-ictal state. No seizures were seen throughout the recording. Priyanka Barbra Sarks   Overnight EEG with video  Result Date: 02/06/2020 Lora Havens, MD     02/06/2020  1:04 PM Patient Name: JACCOB CZAPLICKI MRN: [id number] Epilepsy Attending: Lora Havens Referring Physician/Provider: Dr Rosalin Hawking Duration: 02-27-2020 2044 to 02/06/2020 1142  Patient history: 79 year old male with acute onset aphasia. EEG to evaluate for seizure.  Level of alertness: Awake, asleep  AEDs during EEG study: Gabapentin  Technical aspects: This EEG study was done with scalp electrodes positioned according to the 10-20 International system of electrode placement. Electrical activity was acquired at a sampling rate of 500Hz  and reviewed with a high frequency filter of 70Hz  and a low frequency filter of 1Hz . EEG data were recorded continuously and digitally stored.  Description: The posterior dominant rhythm consists of 8.5 Hz activity of moderate voltage (25-35 uV) seen predominantly in posterior head regions, symmetric and reactive to eye opening and eye closing. Sleep was characterized by vertex waves, sleep spindles (14-16hz ), maximal frontocentral region. EEG showed continuous left hemispheric 3 to 6 Hz theta-delta slowing. Frequent sharp waves were seen in left temporal region as well. Hyperventilation and photic stimulation were not performed.    ABNORMALITY - Continuous slow, left hemispheric - Sharp wave, left temporal region IMPRESSION: This study is showed evidence of epileptogenicity arising from left temporal region as well as cortical dysfunction in left hemisphere likely secondary to underlying structural abnormality, post-ictal state. No seizures were seen throughout the recording.  Priyanka Barbra Sarks         Scheduled Meds: .  stroke: mapping our early stages of recovery book   Does not apply Once  . amLODipine  2.5 mg Oral Daily  . aspirin  300 mg Rectal Daily   Or  . aspirin  325 mg Oral Daily  . atorvastatin  80 mg Oral Daily  . enoxaparin (LOVENOX) injection  40 mg Subcutaneous Q24H  . folic acid  1 mg Oral Daily  . gabapentin  300 mg Oral BID  . multivitamin with  minerals  1 tablet Oral Daily  . pantoprazole  40 mg Oral QAC breakfast  . thiamine  100 mg Oral Daily   Or  . thiamine  100 mg Intravenous Daily   Continuous Infusions: . sodium chloride 50 mL/hr at 02/06/20 1953  . levETIRAcetam 500 mg (02/07/20 0829)     LOS: 0 days        Hosie Poisson, MD [facility]   To contact the attending provider between 7A-7P or the covering provider during after hours 7P-7A, please log into the web site [email or web address] and access using universal [place] password for that web site. If you do not have the password, please call the hospital operator.  02/07/2020, 2:12 PM

## 2020-02-08 DIAGNOSIS — E7800 Pure hypercholesterolemia, unspecified: Secondary | ICD-10-CM

## 2020-02-08 LAB — CBC
HCT: 37.7 % — ABNORMAL LOW (ref 39.0–52.0)
Hemoglobin: 13 g/dL (ref 13.0–17.0)
MCH: 32.1 pg (ref 26.0–34.0)
MCHC: 34.5 g/dL (ref 30.0–36.0)
MCV: 93.1 fL (ref 80.0–100.0)
Platelets: 228 10*3/uL (ref 150–400)
RBC: 4.05 MIL/uL — ABNORMAL LOW (ref 4.22–5.81)
RDW: 12 % (ref 11.5–15.5)
WBC: 7.5 10*3/uL (ref 4.0–10.5)
nRBC: 0 % (ref 0.0–0.2)

## 2020-02-08 LAB — COMPREHENSIVE METABOLIC PANEL
ALT: 22 U/L (ref 0–44)
AST: 31 U/L (ref 15–41)
Albumin: 3.5 g/dL (ref 3.5–5.0)
Alkaline Phosphatase: 54 U/L (ref 38–126)
Anion gap: 9 (ref 5–15)
BUN: 11 mg/dL (ref 8–23)
CO2: 23 mmol/L (ref 22–32)
Calcium: 8.9 mg/dL (ref 8.9–10.3)
Chloride: 104 mmol/L (ref 98–111)
Creatinine, Ser: 0.81 mg/dL (ref 0.61–1.24)
GFR calc Af Amer: >60 mL/min (ref >60)
GFR calc non Af Amer: >60 mL/min (ref >60)
Glucose, Bld: 101 mg/dL — ABNORMAL HIGH (ref 70–99)
Potassium: 3.8 mmol/L (ref 3.5–5.1)
Sodium: 136 mmol/L (ref 135–145)
Total Bilirubin: 0.8 mg/dL (ref 0.3–1.2)
Total Protein: 6.3 g/dL — ABNORMAL LOW (ref 6.5–8.1)

## 2020-02-08 LAB — MAGNESIUM: Magnesium: 2 mg/dL (ref 1.7–2.4)

## 2020-02-08 NOTE — Progress Notes (Signed)
Physical Therapy Treatment Patient Details Name: Carlos Wise MRN: [id number] DOB: Mar 13, 1941 Today's Date: 02/08/2020    History of Present Illness 79 year old male with history of HTN, HLD, admitted to the hospital for confusion and dysarthria which was noted by his wife.  Upon admission CT of the head, CTA head and neck and MRI brain were negative    PT Comments    Patient was laying in hospital bed at the start of the physical therapy session and stated he was "doing better than yesterday." He stated his gown and bed were soiled and that he "does not know when he needs to use the restroom after having radiation for prostate cancer." Sat patient up at EOB and changed gown prior to ambulation. Communicated with nurse tech after session to help change bedding. Transferred to standing and walked approximately 67' with RW in hallway. Needed cueing to keep both hands on the walker while walking and to stand in the center of the RW as he was drifting towards the right. ModA to steer RW and sit down in chair. He was left in the chair with all needs within reach, chair alarm set, in the presence of social worker.     Follow Up Recommendations  SNF     Equipment Recommendations  Other (comment);Rolling walker with 5" wheels (defer to next venue)    Recommendations for Other Services       Precautions / Restrictions Precautions Precautions: Fall Restrictions Weight Bearing Restrictions: No    Mobility  Bed Mobility Overal bed mobility: Needs Assistance Bed Mobility: Supine to Sit     Supine to sit: Min guard;HOB elevated     General bed mobility comments: min guard for safety and steadying  Transfers Overall transfer level: Needs assistance Equipment used: Rolling walker (2 wheeled) Transfers: Sit to/from Stand Sit to Stand: Min assist         General transfer comment: minA for STS w/ RW to raise trunk and stand upright  Ambulation/Gait Ambulation/Gait assistance: Mod  assist Gait Distance (Feet): 40 Feet Assistive device: Rolling walker (2 wheeled) Gait Pattern/deviations: Step-to pattern;Trunk flexed;Drifts right/left;Decreased stride length Gait velocity: slower   General Gait Details: drifting right/left, not centered in RW while walking in hallway. took hands off RW while walking to fix mask. Needed modA to steer RW when turning.   Stairs             Wheelchair Mobility    Modified Rankin (Stroke Patients Only)       Balance Overall balance assessment: Needs assistance   Sitting balance-Leahy Scale: Good Sitting balance - Comments: sat EOB statically x 3 mins without LOB   Standing balance support: Bilateral upper extremity supported;During functional activity Standing balance-Leahy Scale: Fair Standing balance comment: reliant on use of UE's on RW                            Cognition Arousal/Alertness: Awake/alert Behavior During Therapy: Flat affect Overall Cognitive Status: Impaired/Different from baseline Area of Impairment: Following commands;Safety/judgement;Awareness;Problem solving;Attention                   Current Attention Level: Sustained   Following Commands: Follows one step commands with increased time;Follows one step commands inconsistently Safety/Judgement: Decreased awareness of safety;Decreased awareness of deficits Awareness: Emergent Problem Solving: Slow processing;Decreased initiation;Difficulty sequencing;Requires verbal cues;Requires tactile cues General Comments: Pt with slow processing, confusion, and inconsistent following of commands.  Exercises      General Comments        Pertinent Vitals/Pain Pain Assessment: No/denies pain    Home Living                      Prior Function            PT Goals (current goals can now be found in the care plan section) Acute Rehab PT Goals Patient Stated Goal: per wife to figure out what is wrong with her  husband PT Goal Formulation: With patient/family Time For Goal Achievement: 02/20/20 Potential to Achieve Goals: Good Progress towards PT goals: Progressing toward goals    Frequency           PT Plan Current plan remains appropriate    Co-evaluation              AM-PAC PT "6 Clicks" Mobility   Outcome Measure  Help needed turning from your back to your side while in a flat bed without using bedrails?: A Little Help needed moving from lying on your back to sitting on the side of a flat bed without using bedrails?: A Little Help needed moving to and from a bed to a chair (including a wheelchair)?: A Little Help needed standing up from a chair using your arms (e.g., wheelchair or bedside chair)?: A Little Help needed to walk in hospital room?: A Little Help needed climbing 3-5 steps with a railing? : A Lot 6 Click Score: 17    End of Session Equipment Utilized During Treatment: Gait belt Activity Tolerance: Patient tolerated treatment well Patient left: in chair;with call bell/phone within reach;with chair alarm set Nurse Communication: Mobility status PT Visit Diagnosis: Unsteadiness on feet (R26.81);Muscle weakness (generalized) (M62.81)     Time:  -     Charges:                        Livingston Diones, SPT, ATC

## 2020-02-08 NOTE — TOC Initial Note (Addendum)
Transition of Care [facility]) - Initial/Assessment Note    Patient Details  Name: Carlos Wise MRN: [id number] Date of Birth: 1940/11/27  [facility] CM/SW Contact:    Carlos Chars, LCSW Phone Number: 02/08/2020, 3:00 PM  Clinical Narrative:   Pt has one bed offer: [facility].  Wife in the room, met with CSW, does not like offer or other SNF options for [facility].  She asked about HH again and this was discussed. Wife has questions about pt current medical status, MD Enis Gash and agreed to meet with wife.       1515: After talking to MD, pt and wife will accept offer to [facility].  [place] at [place] notified.  Wife asked for transfer tomorrow and Dr Karleen Hampshire agrees.            Expected Discharge Plan: [place]     Patient Goals and CMS Choice Patient states their goals for this hospitalization and ongoing recovery are:: stay healthy CMS Medicare.gov Compare Post Acute Care list provided to:: Patient Choice offered to / list presented to : Patient  Expected Discharge Plan and Services Expected Discharge Plan: [place] Choice: [place] arrangements for the past 2 months: Single Family Home                                      Prior Living Arrangements/Services Living arrangements for the past 2 months: Single Family Home Lives with:: Spouse Patient language and need for interpreter reviewed:: Yes Do you feel safe going back to the place where you live?: Yes      Need for Family Participation in Patient Care: Yes (Comment) Care giver support system in place?: Yes (comment)   Criminal Activity/Legal Involvement Pertinent to Current Situation/Hospitalization: No - Comment as needed  Activities of Daily Living      Permission Sought/Granted Permission sought to share information with : Facility Sport and exercise psychologist, Family Supports    Share Information with  NAME: Carlos Wise, wife  Permission granted to share info w AGENCY: SNF        Emotional Assessment Appearance:: Appears stated age Attitude/Demeanor/Rapport: Lethargic Affect (typically observed): Pleasant Orientation: : Oriented to Self, Oriented to Place, Oriented to  Time, Oriented to Situation Alcohol / Substance Use: Alcohol Use Psych Involvement: No (comment)  Admission diagnosis:  Aphasia [R47.01] Stroke-like symptoms [R29.90] Altered mental status [R41.82] Patient Active Problem List   Diagnosis Date Noted  . Altered mental status 02/07/2020  . Aphasia 02/04/2020  . Leukocytosis 02/04/2020  . Hyponatremia 02/04/2020  . Prolonged QT interval 02/04/2020  . 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 Malignant neoplasm of prostate ([place]) 07/24/2014   PCP:  Carlos Sake, MD Pharmacy:   [facility], [place] [address] Phone: [phone] Fax: [phone]  [id number] [place]: [phone]: [phone]    Social Determinants of Health (SDOH) Interventions    Readmission Risk Interventions No flowsheet data found.

## 2020-02-08 NOTE — Progress Notes (Signed)
PROGRESS NOTE    Carlos Wise  KGU:[id number] DOB: August 18, 1940 DOA: 02/04/2020 PCP: [name], MD    Chief Complaint  Patient presents with   Code Stroke    Brief Narrative:  79 year old male prior history of hypertension, hyperlipidemia admitted to the hospital for confusion and dysarthria.  He was initially evaluated for stroke in which was negative.  Neurology was consulted and he underwent continuous EEG to evaluate for seizures. Assessment & Plan:   Principal Problem:   Aphasia Active Problems:   Malignant neoplasm of prostate (HCC)   Leukocytosis   Hyponatremia   Prolonged QT interval   Alcohol use   Altered mental status  Dysarthria /acute metabolic encephalopathy Unclear etiology initially thought to be secondary to prolonged postictal state.  Neurology recommended continuous EEG.  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  No seizures were seen throughout the reading. MRI of the brain is negative for acute stroke, CT area of the head and neck did not show acute intracranial abnormalities. Neurology recommended to continue with Keppra 500 mg twice daily and aspirin 325 mg daily. Hemoglobin A1c is 5.6 and LDL is 193.,  Continue with Lipitor 80 mg daily. No driving for 6 months and he should be under supervision.     Essential hypertension Well controlled.   Prolonged QTC Appears to have be resolved on repeat EKG.      Hyperlipidemia Continue with Lipitor.   Chronic diastolic heart failure Patient appears to be compensated at this time. Echocardiogram shows Left ventricular ejection fraction, by estimation, is 60 to 65%. The  left ventricle has normal function. The left ventricle has no regional  wall motion abnormalities. There is mild concentric left ventricular  hypertrophy. Left  ventricular diastolic  parameters are consistent with Grade I diastolic dysfunction (impaired  relaxation).     Peripheral neuropathy Patient is on gabapentin continue the same.   GERD Stable. . Alcohol abuse Continue with CIWA. No withdrawal signs at this time.    History of prostate cancer S/p radiation Outpatient follow-up with urology.     DVT prophylaxis: Lovenox. Code Status: full code.  Family Communication: none at bedside. Spoke with wife over the phone.  Disposition:   Status is: Inpatient.   The patient will require care spanning > 2 midnights and should be moved to inpatient because: Unsafe d/c plan  Dispo: The patient is from: Home              Anticipated d/c is to: SNF              Anticipated d/c date is: 1 day              Patient currently is not medically stable to d/c.       Consultants:   None.    Procedures: none.    Antimicrobials: none    Subjective: No new complaints.   Objective: Vitals:   02/07/20 0746 02/07/20 1651 02/07/20 2258 02/08/20 0750  BP: (!) 154/90 116/73 133/87 134/69  Pulse: 67 88 64 63  Resp: 19 19 18 16   Temp: 97.8 F (36.6 C) 97.8 F (36.6 C) 97.9 F (36.6 C) 98.2 F (36.8 C)  TempSrc:   Oral   SpO2: 99% 97% 97% 98%    Intake/Output Summary (Last 24 hours) at 02/08/2020 1437 Last data filed at 02/08/2020 6283 Gross  per 24 hour  Intake 1406.72 ml  Output 800 ml  Net 606.72 ml   There were no vitals filed for this visit.  Examination:  General exam: Alert and comfortable Respiratory system: Clear to auscultation bilaterally, no wheezing or rhonchi  cardiovascular system: S1-S2 heard, regular rate rhythm, no JVD.   Gastrointestinal system: Abdomen is soft, nontender, nondistended, bowel sounds normal Central nervous system: Alert and oriented, grossly nonfocal Extremities: No pedal edema Skin: No rashes seen Psychiatry: Mood is appropriate   Data Reviewed: I have personally reviewed  following labs and imaging studies  CBC: Recent Labs  Lab 02/04/20 1319 02/04/20 1319 02/04/20 1328 02/05/20 0634 02/06/20 0157 02/07/20 0049 02/08/20 0408  WBC 12.2*  --   --  11.8* 10.4 8.4 7.5  NEUTROABS 10.6*  --   --   --   --   --   --   HGB 15.2   < > 15.3 14.7 14.4 13.6 13.0  HCT 43.5   < > 45.0 41.6 41.2 39.1 37.7*  MCV 91.6  --   --  93.1 94.3 94.2 93.1  PLT 231  --   --  261 224 207 228   < > = values in this interval not displayed.    Basic Metabolic Panel: Recent Labs  Lab 02/04/20 1319 02/04/20 1319 02/04/20 1328 02/04/20 1419 02/05/20 0634 02/06/20 0157 02/07/20 0049 02/08/20 0408  NA 127*   < > 128*  --  131* 130* 133* 136  K 4.2   < > 4.2  --  3.8 3.5 3.7 3.8  CL 92*   < > 94*  --  95* 97* 101 104  CO2 21*  --   --   --  23 24 24 23   GLUCOSE 154*   < > 156*  --  113* 93 130* 101*  BUN 10   < > 10  --  14 16 18 11   CREATININE 0.90   < > 0.80  --  0.88 0.97 0.96 0.81  CALCIUM 9.5  --   --   --  9.6 9.3 9.0 8.9  MG  --   --   --  1.9  --  2.1 2.1 2.0  PHOS  --   --   --  3.6  --   --   --   --    < > = values in this interval not displayed.    GFR: CrCl cannot be calculated (Unknown ideal weight.).  Liver Function Tests: Recent Labs  Lab 02/04/20 1319 02/05/20 0634 02/06/20 0157 02/07/20 0049 02/08/20 0408  AST 23 28 46* 40 31  ALT 17 16 20 23 22   ALKPHOS 66 55 50 47 54  BILITOT 1.7* 1.9* 1.6* 0.9 0.8  PROT 7.6 7.4 6.8 6.1* 6.3*  ALBUMIN 4.3 4.1 3.9 3.4* 3.5    CBG: Recent Labs  Lab 02/04/20 1319 02/04/20 1555  GLUCAP 165* 153*     Recent Results (from the past 240 hour(s))  SARS Coronavirus 2 by RT PCR (hospital order, performed in [facility] hospital lab) Nasopharyngeal Nasopharyngeal Swab     Status: None   Collection Time: 02/04/20  1:28 PM   Specimen: Nasopharyngeal Swab  Result Value Ref Range Status   SARS Coronavirus 2 NEGATIVE NEGATIVE Final    Comment: (NOTE) SARS-CoV-2 target nucleic acids are NOT  DETECTED.  The SARS-CoV-2 RNA is generally detectable in upper and lower respiratory specimens during the acute phase of infection. The lowest concentration of  SARS-CoV-2 viral copies this assay can detect is 250 copies / mL. A negative result does not preclude SARS-CoV-2 infection and should not be used as the sole basis for treatment or other patient management decisions.  A negative result may occur with improper specimen collection / handling, submission of specimen other than nasopharyngeal swab, presence of viral mutation(s) within the areas targeted by this assay, and inadequate number of viral copies (<250 copies / mL). A negative result must be combined with clinical observations, patient history, and epidemiological information.  Fact Sheet for Patients:   StrictlyIdeas.no  Fact Sheet for Healthcare Providers: BankingDealers.co.za  This test is not yet approved or  cleared by the [place] FDA and has been authorized for detection and/or diagnosis of SARS-CoV-2 by FDA under an Emergency Use Authorization (EUA).  This EUA will remain in effect (meaning this test can be used) for the duration of the COVID-19 declaration under Section 564(b)(1) of the Act, 21 U.S.C. section 360bbb-3(b)(1), unless the authorization is terminated or revoked sooner.  Performed at [facility], [place] [address], [place], [place] [address]          Radiology Studies: No results found.      Scheduled Meds:   stroke: mapping our early stages of recovery book   Does not apply Once   amLODipine  2.5 mg Oral Daily   aspirin  300 mg Rectal Daily   Or   aspirin  325 mg Oral Daily   atorvastatin  80 mg Oral Daily   enoxaparin (LOVENOX) injection  40 mg Subcutaneous V74M   folic acid  1 mg Oral Daily   gabapentin  300 mg Oral BID   multivitamin with minerals  1 tablet Oral Daily   pantoprazole  40 mg Oral QAC breakfast    thiamine  100 mg Oral Daily   Or   thiamine  100 mg Intravenous Daily   Continuous Infusions:  sodium chloride 50 mL/hr at 02/06/20 1953   levETIRAcetam 500 mg (02/08/20 0832)     LOS: 1 day        Hosie Poisson, MD [facility]   To contact the attending provider between 7A-7P or the covering provider during after hours 7P-7A, please log into the web site [email or web address] and access using universal [facility] password for that web site. If you do not have the password, please call the hospital operator.  02/08/2020, 2:37 PM

## 2020-02-09 DIAGNOSIS — R4182 Altered mental status, unspecified: Secondary | ICD-10-CM

## 2020-02-09 LAB — SARS CORONAVIRUS 2 BY RT PCR (HOSPITAL ORDER, PERFORMED IN ~~LOC~~ HOSPITAL LAB): SARS Coronavirus 2: NEGATIVE

## 2020-02-09 LAB — CBC
HCT: 37.3 % — ABNORMAL LOW (ref 39.0–52.0)
Hemoglobin: 13.2 g/dL (ref 13.0–17.0)
MCH: 33.1 pg (ref 26.0–34.0)
MCHC: 35.4 g/dL (ref 30.0–36.0)
MCV: 93.5 fL (ref 80.0–100.0)
Platelets: 247 10*3/uL (ref 150–400)
RBC: 3.99 MIL/uL — ABNORMAL LOW (ref 4.22–5.81)
RDW: 12 % (ref 11.5–15.5)
WBC: 7.6 10*3/uL (ref 4.0–10.5)
nRBC: 0 % (ref 0.0–0.2)

## 2020-02-09 LAB — MAGNESIUM: Magnesium: 2 mg/dL (ref 1.7–2.4)

## 2020-02-09 MED ORDER — LEVETIRACETAM 500 MG PO TABS
500.0000 mg | ORAL_TABLET | Freq: Two times a day (BID) | ORAL | 1 refills | Status: DC
Start: 2020-02-09 — End: 2020-03-20

## 2020-02-09 MED ORDER — FOLIC ACID 1 MG PO TABS: 1.0000 mg | ORAL_TABLET | Freq: Every day | ORAL | Status: DC

## 2020-02-09 MED ORDER — ASPIRIN 325 MG PO TABS
325.0000 mg | ORAL_TABLET | Freq: Every day | ORAL | 1 refills | Status: DC
Start: 2020-02-09 — End: 2020-03-20

## 2020-02-09 MED ORDER — LEVETIRACETAM 500 MG PO TABS
500.0000 mg | ORAL_TABLET | Freq: Two times a day (BID) | ORAL | Status: DC
  Administered 2020-02-09: 500 mg via ORAL
  Filled 2020-02-09: qty 1

## 2020-02-09 MED ORDER — SENNOSIDES-DOCUSATE SODIUM 8.6-50 MG PO TABS: 2.0000 | ORAL_TABLET | Freq: Every evening | ORAL | Status: DC | PRN

## 2020-02-09 MED ORDER — ATORVASTATIN CALCIUM 80 MG PO TABS
80.0000 mg | ORAL_TABLET | Freq: Every day | ORAL | 1 refills | Status: DC
Start: 2020-02-09 — End: 2020-03-20

## 2020-02-09 MED ORDER — POLYETHYLENE GLYCOL 3350 17 G PO PACK: 17.0000 g | PACK | Freq: Every day | ORAL | 0 refills | Status: DC | PRN

## 2020-02-09 MED ORDER — THIAMINE HCL 100 MG PO TABS: 100.0000 mg | ORAL_TABLET | Freq: Every day | ORAL | Status: DC

## 2020-02-09 NOTE — Plan of Care (Signed)
  Problem: Consults Goal: RH STROKE PATIENT EDUCATION Description: See Patient Education module for education specifics  Outcome: Progressing   Problem: RH SKIN INTEGRITY Goal: RH STG SKIN FREE OF INFECTION/BREAKDOWN Outcome: Progressing   Problem: RH SAFETY Goal: RH STG ADHERE TO SAFETY PRECAUTIONS W/ASSISTANCE/DEVICE Description: STG Adhere to Safety Precautions With Assistance/Device. Outcome: Progressing

## 2020-02-09 NOTE — Discharge Summary (Signed)
Physician Discharge Summary  Carlos Wise WHQ:[id number] DOB: July 11, 1940 DOA: 02/04/2020  PCP: [name], MD  Admit date: 02/04/2020 Discharge date: 02/09/2020  Admitted From:HOME. Disposition: SNF  Recommendations for Outpatient Follow-up:  1. Follow up with PCP in 1-2 weeks 2. Please obtain BMP/CBC in one week Please follow up with neurology as recommended.   Discharge Condition: STABLE.  CODE STATUS:FULL CODE.  Diet recommendation: Heart Healthy   Brief/Interim Summary:  79 year old male prior history of hypertension, hyperlipidemia admitted to the hospital for confusion and dysarthria.  He was initially evaluated for stroke in which was negative.  Neurology was consulted and he underwent continuous EEG to evaluate for seizures. He was started on keppra and he appears to be back to baseline at this time.    Discharge Diagnoses:  Principal Problem:   Aphasia Active Problems:   Malignant neoplasm of prostate (HCC)   Leukocytosis   Hyponatremia   Prolonged QT interval   Alcohol use   Altered mental status  Dysarthria /acute metabolic encephalopathy Unclear etiology initially thought to be secondary to prolonged postictal state.  Neurology recommended continuous EEG.  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  No seizures were seen throughout the reading. MRI of the brain is negative for acute stroke, CT area of the head and neck did not show acute intracranial abnormalities. Neurology recommended to continue with Keppra 500 mg twice daily and aspirin 325 mg daily. Hemoglobin A1c is 5.6 and LDL is 193.,  Continue with Lipitor 80 mg daily. No driving for 6 months and he should be under supervision.     Essential hypertension Well controlled.   Prolonged QTC Appears to have be resolved on repeat EKG.       Hyperlipidemia Continue with Lipitor.   Chronic diastolic heart failure Patient appears to be compensated at this time. Echocardiogram shows Left ventricular ejection fraction, by estimation, is 60 to 65%. The  left ventricle has normal function. The left ventricle has no regional  wall motion abnormalities. There is mild concentric left ventricular  hypertrophy. Left ventricular diastolic  parameters are consistent with Grade I diastolic dysfunction (impaired  relaxation).     Peripheral neuropathy Patient is on gabapentin continue the same.   GERD Stable. . Alcohol abuse Continue with CIWA. No withdrawal signs at this time.    History of prostate cancer S/p radiation Outpatient follow-up with urology.    Discharge Instructions  Discharge Instructions    Ambulatory referral to Neurology   Complete by: As directed    Follow up with stroke clinic NP (Jessica Vanschaick or Cecille Rubin, if both not available, consider Zachery Dauer, or Ahern) at [facility] in about 4 weeks. Thanks.   Diet - low sodium heart healthy   Complete by: As directed    Discharge instructions   Complete by: As directed    Please followup with PCP in one to two week.s  Please follow up with neurology in 2 weeks.     Allergies as of 02/09/2020      Reactions   Penicillins Other (See Comments)   Patient states no allergy, but has preference that he does not want this medication      Medication List    STOP taking these medications   naproxen sodium 220 MG tablet Commonly known as: ALEVE   simvastatin 20 MG tablet Commonly known as: ZOCOR  simvastatin 40 MG tablet Commonly known as: ZOCOR     TAKE these medications   amLODipine 2.5 MG tablet Commonly known as: NORVASC Take 2.5 mg by mouth daily. What changed: Another medication with the same name was removed. Continue taking this medication, and follow the directions you see here.   aspirin 325 MG  tablet Take 1 tablet (325 mg total) by mouth daily. What changed:   medication strength  how much to take   atorvastatin 80 MG tablet Commonly known as: LIPITOR Take 1 tablet (80 mg total) by mouth daily.   CENTRUM ADULTS PO Take 1 tablet by mouth daily.   escitalopram 20 MG tablet Commonly known as: LEXAPRO Take 20 mg by mouth daily.   fluticasone 50 MCG/ACT nasal spray Commonly known as: FLONASE Place 2 sprays into both nostrils daily as needed for allergies or rhinitis.   folic acid 1 MG tablet Commonly known as: FOLVITE Take 1 tablet (1 mg total) by mouth daily.   gabapentin 300 MG capsule Commonly known as: NEURONTIN Take 300 mg by mouth See admin instructions. Take 300 mg by mouth one to two times a day   levETIRAcetam 500 MG tablet Commonly known as: KEPPRA Take 1 tablet (500 mg total) by mouth 2 (two) times daily.   pantoprazole 40 MG tablet Commonly known as: PROTONIX Take 40 mg by mouth daily before breakfast.   polyethylene glycol 17 g packet Commonly known as: MIRALAX / GLYCOLAX Take 17 g by mouth daily as needed for severe constipation.   senna-docusate 8.6-50 MG tablet Commonly known as: Senokot-S Take 2 tablets by mouth at bedtime as needed for mild constipation or moderate constipation.   tamsulosin 0.4 MG Caps capsule Commonly known as: FLOMAX Take 1 capsule (0.4 mg total) by mouth daily.   thiamine 100 MG tablet Take 1 tablet (100 mg total) by mouth daily.   VITAMIN B-12 ER PO Take 1 tablet by mouth daily.   Vitamin D3 10 MCG (400 UNIT) Caps Take 400 Units by mouth daily. What changed: Another medication with the same name was removed. Continue taking this medication, and follow the directions you see here.       Contact information for follow-up providers    [facility]. Schedule an appointment as soon as possible for a visit in 4 week(s).   Specialty: Neurology Contact information: [address] [place] [phone]           Contact information for after-discharge care    Destination    [facility] .   Service: Skilled Nursing Contact information: [place] [phone]                 Allergies  Allergen Reactions  . Penicillins Other (See Comments)    Patient states no allergy, but has preference that he does not want this medication    Consultations:  Neurology.    Procedures/Studies: CT Code Stroke CTA Head W/WO contrast  Result Date: 02/04/2020 CLINICAL DATA:  Aphasia. EXAM: CT ANGIOGRAPHY HEAD AND NECK CT PERFUSION BRAIN TECHNIQUE: Multidetector CT imaging of the head and neck was performed using the standard protocol during bolus administration of intravenous contrast. Multiplanar CT image reconstructions and MIPs were obtained to evaluate the vascular anatomy. Carotid stenosis measurements (when applicable) are obtained utilizing NASCET criteria, using the distal internal carotid diameter as the denominator. Multiphase CT imaging of the brain was performed following IV bolus contrast injection.  Subsequent parametric perfusion maps were calculated using RAPID software. CONTRAST:  Dose is currently not known COMPARISON:  Brain MRI 07/06/2005 FINDINGS: CTA NECK FINDINGS Aortic arch: The proximal aorta arch is larger than the descending. No aneurysmal measurement where covered (maximum 37 mm). Right carotid system: Calcified plaque at the bifurcation and proximal ICA without flow limiting stenosis or ulceration. Left carotid system: Calcified plaque at the bifurcation and proximal ICA without flow limiting stenosis, beading, or ulceration. Vertebral arteries: Proximal subclavian atherosclerosis without significant stenosis. Symmetric vertebral arteries with smooth and widely patent lumens to the dura. Skeleton: Degenerative changes without acute or aggressive finding. Other neck: No  significant incidental finding. Upper chest: Negative Review of the MIP images confirms the above findings CTA HEAD FINDINGS Anterior circulation: Atheromatous plaque on the bilateral carotid siphon with right-sided stent spanning cavernous to supraclinoid. No evidence of in stent stenosis. There has been coil embolization of a superior hypophyseal region aneurysm on the right. Hypoplastic left A1 segment. No evident branch occlusion, beading, or filling aneurysm. Posterior circulation: The vertebral and basilar arteries are widely patent. No branch occlusion, beading, or aneurysm. Venous sinuses: Unremarkable Anatomic variants: As above Review of the MIP images confirms the above findings CT Brain Perfusion Findings: ASPECTS: 10 CBF (<30%) Volume: 35mL Perfusion (Tmax>6.0s) volume: 23mL IMPRESSION: CTA head and neck: 1. No emergent finding, including large vessel occlusion. 2. Atherosclerosis without flow limiting stenosis of major vessels in the head and neck. 3. Stent assisted right ICA aneurysm coiling. No evidence of recurrence. CT perfusion: No infarct or penumbra by standard parameters Electronically Signed   By: Monte Fantasia M.D.   On: 02/04/2020 13:48   DG Chest 2 View  Result Date: 02/04/2020 CLINICAL DATA:  Aphasia EXAM: CHEST - 2 VIEW COMPARISON:  Report from March of 2017 FINDINGS: Trachea is midline. Cardiomediastinal contours and hilar structures are normal. Multiple leads project over the chest. Lungs are clear.  No sign of pleural effusion. On limited assessment skeletal structures are unremarkable. IMPRESSION: No acute cardiopulmonary disease. Electronically Signed   By: Zetta Bills M.D.   On: 02/04/2020 15:27   CT Code Stroke CTA Neck W/WO contrast  Result Date: 02/04/2020 CLINICAL DATA:  Aphasia. EXAM: CT ANGIOGRAPHY HEAD AND NECK CT PERFUSION BRAIN TECHNIQUE: Multidetector CT imaging of the head and neck was performed using the standard protocol during bolus administration of  intravenous contrast. Multiplanar CT image reconstructions and MIPs were obtained to evaluate the vascular anatomy. Carotid stenosis measurements (when applicable) are obtained utilizing NASCET criteria, using the distal internal carotid diameter as the denominator. Multiphase CT imaging of the brain was performed following IV bolus contrast injection. Subsequent parametric perfusion maps were calculated using RAPID software. CONTRAST:  Dose is currently not known COMPARISON:  Brain MRI 07/06/2005 FINDINGS: CTA NECK FINDINGS Aortic arch: The proximal aorta arch is larger than the descending. No aneurysmal measurement where covered (maximum 37 mm). Right carotid system: Calcified plaque at the bifurcation and proximal ICA without flow limiting stenosis or ulceration. Left carotid system: Calcified plaque at the bifurcation and proximal ICA without flow limiting stenosis, beading, or ulceration. Vertebral arteries: Proximal subclavian atherosclerosis without significant stenosis. Symmetric vertebral arteries with smooth and widely patent lumens to the dura. Skeleton: Degenerative changes without acute or aggressive finding. Other neck: No significant incidental finding. Upper chest: Negative Review of the MIP images confirms the above findings CTA HEAD FINDINGS Anterior circulation: Atheromatous plaque on the bilateral carotid siphon with right-sided stent spanning cavernous to supraclinoid. No  evidence of in stent stenosis. There has been coil embolization of a superior hypophyseal region aneurysm on the right. Hypoplastic left A1 segment. No evident branch occlusion, beading, or filling aneurysm. Posterior circulation: The vertebral and basilar arteries are widely patent. No branch occlusion, beading, or aneurysm. Venous sinuses: Unremarkable Anatomic variants: As above Review of the MIP images confirms the above findings CT Brain Perfusion Findings: ASPECTS: 10 CBF (<30%) Volume: 11mL Perfusion (Tmax>6.0s) volume:  59mL IMPRESSION: CTA head and neck: 1. No emergent finding, including large vessel occlusion. 2. Atherosclerosis without flow limiting stenosis of major vessels in the head and neck. 3. Stent assisted right ICA aneurysm coiling. No evidence of recurrence. CT perfusion: No infarct or penumbra by standard parameters Electronically Signed   By: Monte Fantasia M.D.   On: 02/04/2020 13:48   MR Brain Wo Contrast (neuro protocol)  Result Date: 02/04/2020 CLINICAL DATA:  Suspected stroke.  Aphasia. EXAM: MRI HEAD WITHOUT CONTRAST TECHNIQUE: Multiplanar, multiecho pulse sequences of the brain and surrounding structures were obtained without intravenous contrast. COMPARISON:  Head CT 02/04/2020 FINDINGS: Examination degraded by motion. BRAIN: No acute infarct, acute hemorrhage or extra-axial collection. Multifocal white matter hyperintensity, most commonly due to chronic ischemic microangiopathy. Advanced parenchymal volume loss. No chronic microhemorrhage. Normal midline structures. VASCULAR: Major flow voids are preserved. SKULL AND UPPER CERVICAL SPINE: Normal calvarium and skull base. Visualized upper cervical spine and soft tissues are normal. SINUSES/ORBITS: No paranasal sinus fluid levels or advanced mucosal thickening. No mastoid or middle ear effusion. Normal orbits. IMPRESSION: 1. Motion degraded examination. 2. No acute intracranial process. 3. Advanced parenchymal volume loss and findings of chronic small vessel disease. Electronically Signed   By: Ulyses Jarred M.D.   On: 02/04/2020 21:38   CT Code Stroke Cerebral Perfusion with contrast  Result Date: 02/04/2020 CLINICAL DATA:  Aphasia. EXAM: CT ANGIOGRAPHY HEAD AND NECK CT PERFUSION BRAIN TECHNIQUE: Multidetector CT imaging of the head and neck was performed using the standard protocol during bolus administration of intravenous contrast. Multiplanar CT image reconstructions and MIPs were obtained to evaluate the vascular anatomy. Carotid stenosis  measurements (when applicable) are obtained utilizing NASCET criteria, using the distal internal carotid diameter as the denominator. Multiphase CT imaging of the brain was performed following IV bolus contrast injection. Subsequent parametric perfusion maps were calculated using RAPID software. CONTRAST:  Dose is currently not known COMPARISON:  Brain MRI 07/06/2005 FINDINGS: CTA NECK FINDINGS Aortic arch: The proximal aorta arch is larger than the descending. No aneurysmal measurement where covered (maximum 37 mm). Right carotid system: Calcified plaque at the bifurcation and proximal ICA without flow limiting stenosis or ulceration. Left carotid system: Calcified plaque at the bifurcation and proximal ICA without flow limiting stenosis, beading, or ulceration. Vertebral arteries: Proximal subclavian atherosclerosis without significant stenosis. Symmetric vertebral arteries with smooth and widely patent lumens to the dura. Skeleton: Degenerative changes without acute or aggressive finding. Other neck: No significant incidental finding. Upper chest: Negative Review of the MIP images confirms the above findings CTA HEAD FINDINGS Anterior circulation: Atheromatous plaque on the bilateral carotid siphon with right-sided stent spanning cavernous to supraclinoid. No evidence of in stent stenosis. There has been coil embolization of a superior hypophyseal region aneurysm on the right. Hypoplastic left A1 segment. No evident branch occlusion, beading, or filling aneurysm. Posterior circulation: The vertebral and basilar arteries are widely patent. No branch occlusion, beading, or aneurysm. Venous sinuses: Unremarkable Anatomic variants: As above Review of the MIP images confirms the above findings CT  Brain Perfusion Findings: ASPECTS: 10 CBF (<30%) Volume: 90mL Perfusion (Tmax>6.0s) volume: 31mL IMPRESSION: CTA head and neck: 1. No emergent finding, including large vessel occlusion. 2. Atherosclerosis without flow limiting  stenosis of major vessels in the head and neck. 3. Stent assisted right ICA aneurysm coiling. No evidence of recurrence. CT perfusion: No infarct or penumbra by standard parameters Electronically Signed   By: Monte Fantasia M.D.   On: 02/04/2020 13:48   EEG adult  Result Date: 02/05/2020 Lora Havens, MD     02/05/2020  7:44 PM Patient Name: DONTAVIS TSCHANTZ MRN: [id number] Epilepsy Attending: Lora Havens Referring Physician/Provider: Dr Roland Rack Date: 02/05/2020 Duration: 29.51 mins Patient history: 79 year old male with acute onset aphasia. EEG to evaluate for seizure. Level of alertness: Awake AEDs during EEG study: Gabapentin Technical aspects: This EEG study was done with scalp electrodes positioned according to the 10-20 International system of electrode placement. Electrical activity was acquired at a sampling rate of 500Hz  and reviewed with a high frequency filter of 70Hz  and a low frequency filter of 1Hz . EEG data were recorded continuously and digitally stored. Description: The posterior dominant rhythm consists of 8.5 Hz activity of moderate voltage (25-35 uV) seen predominantly in posterior head regions, asymmetric ( L<R) and reactive to eye opening and eye closing. EEG showed continuous left hemispheric 3 to 6 Hz theta-delta slowing. Frequent sharp waves were seen in left temporal region as well. Hyperventilation and photic stimulation were not performed.   ABNORMALITY - Continuous slow, left hemispheric - Sharp wave, left temporal region - Background asymmetry, left <right IMPRESSION: This study is showed evidence of epileptogenicity arising from left temporal region as well as cortical dysfunction in left hemisphere likely secondary to underlying structural abnormality, post-ictal state. No seizures were seen throughout the recording. Priyanka Barbra Sarks   Overnight EEG with video  Result Date: 02/06/2020 Lora Havens, MD     02/06/2020  1:04 PM Patient Name: JABE JEANBAPTISTE MRN:  [id number] Epilepsy Attending: Lora Havens Referring Physician/Provider: Dr Rosalin Hawking Duration: 02/05/2020 2044 to 02/06/2020 1142  Patient history: 79 year old male with acute onset aphasia. EEG to evaluate for seizure.  Level of alertness: Awake, asleep  AEDs during EEG study: Gabapentin  Technical aspects: This EEG study was done with scalp electrodes positioned according to the 10-20 International system of electrode placement. Electrical activity was acquired at a sampling rate of 500Hz  and reviewed with a high frequency filter of 70Hz  and a low frequency filter of 1Hz . EEG data were recorded continuously and digitally stored.  Description: The posterior dominant rhythm consists of 8.5 Hz activity of moderate voltage (25-35 uV) seen predominantly in posterior head regions, symmetric and reactive to eye opening and eye closing. Sleep was characterized by vertex waves, sleep spindles (14-16hz ), maximal frontocentral region. EEG showed continuous left hemispheric 3 to 6 Hz theta-delta slowing. Frequent sharp waves were seen in left temporal region as well. Hyperventilation and photic stimulation were not performed.    ABNORMALITY - Continuous slow, left hemispheric - Sharp wave, left temporal region IMPRESSION: This study is showed evidence of epileptogenicity arising from left temporal region as well as cortical dysfunction in left hemisphere likely secondary to underlying structural abnormality, post-ictal state. No seizures were seen throughout the recording.  Lora Havens   ECHOCARDIOGRAM COMPLETE  Result Date: 02/04/2020    ECHOCARDIOGRAM REPORT   Patient Name:   RAYMAR JOINER Date of Exam: 02/04/2020 Medical Rec #:  [id number]  Height:       67.1 in Accession #:    [id number]   Weight:       196.8 lb Date of Birth:  1940/07/28    BSA:          2.011 m Patient Age:    3 years     BP:           167/96 mmHg Patient Gender: M            HR:           86 bpm. Exam Location:  Inpatient Procedure:  2D Echo, Cardiac Doppler and Color Doppler Indications:    Stroke 434.91/I163.9  History:        Patient has no prior history of Echocardiogram examinations.  Sonographer:    Merrie Roof RDCS Referring Phys: [id number] RONDELL A SMITH  Sonographer Comments: Poor compliance secondary to altered mental status due to stroke. IMPRESSIONS  1. Left ventricular ejection fraction, by estimation, is 60 to 65%. The left ventricle has normal function. The left ventricle has no regional wall motion abnormalities. There is mild concentric left ventricular hypertrophy. Left ventricular diastolic parameters are consistent with Grade I diastolic dysfunction (impaired relaxation).  2. Right ventricular systolic function is normal. The right ventricular size is normal. There is normal pulmonary artery systolic pressure.  3. The mitral valve is normal in structure. No evidence of mitral valve regurgitation. No evidence of mitral stenosis.  4. The aortic valve is normal in structure. Aortic valve regurgitation is not visualized. No aortic stenosis is present.  5. The inferior vena cava is normal in size with greater than 50% respiratory variability, suggesting right atrial pressure of 3 mmHg. Conclusion(s)/Recommendation(s): No intracardiac source of embolism detected on this transthoracic study. A transesophageal echocardiogram is recommended to exclude cardiac source of embolism if clinically indicated. FINDINGS  Left Ventricle: Left ventricular ejection fraction, by estimation, is 60 to 65%. The left ventricle has normal function. The left ventricle has no regional wall motion abnormalities. The left ventricular internal cavity size was normal in size. There is  mild concentric left ventricular hypertrophy. Left ventricular diastolic parameters are consistent with Grade I diastolic dysfunction (impaired relaxation). Normal left ventricular filling pressure. Right Ventricle: The right ventricular size is normal. No increase in right  ventricular wall thickness. Right ventricular systolic function is normal. There is normal pulmonary artery systolic pressure. Left Atrium: Left atrial size was normal in size. Right Atrium: Right atrial size was normal in size. Pericardium: There is no evidence of pericardial effusion. Mitral Valve: The mitral valve is normal in structure. Normal mobility of the mitral valve leaflets. No evidence of mitral valve regurgitation. No evidence of mitral valve stenosis. Tricuspid Valve: The tricuspid valve is normal in structure. Tricuspid valve regurgitation is trivial. No evidence of tricuspid stenosis. Aortic Valve: The aortic valve is normal in structure. Aortic valve regurgitation is not visualized. No aortic stenosis is present. Pulmonic Valve: The pulmonic valve was normal in structure. Pulmonic valve regurgitation is not visualized. No evidence of pulmonic stenosis. Aorta: The aortic root is normal in size and structure. Venous: The inferior vena cava is normal in size with greater than 50% respiratory variability, suggesting right atrial pressure of 3 mmHg. IAS/Shunts: No atrial level shunt detected by color flow Doppler.  LEFT VENTRICLE PLAX 2D LVIDd:         4.20 cm     Diastology LVIDs:         2.80 cm  LV e' lateral:   8.49 cm/s LV PW:         1.20 cm     LV E/e' lateral: 7.9 LV IVS:        1.10 cm     LV e' medial:    7.40 cm/s LVOT diam:     2.00 cm     LV E/e' medial:  9.1 LV SV:         74 LV SV Index:   37 LVOT Area:     3.14 cm  LV Volumes (MOD) LV vol d, MOD A4C: 63.4 ml LV vol s, MOD A4C: 24.4 ml LV SV MOD A4C:     63.4 ml LEFT ATRIUM           Index LA diam:      3.70 cm 1.84 cm/m LA Vol (A4C): 59.4 ml 29.54 ml/m  AORTIC VALVE LVOT Vmax:   122.00 cm/s LVOT Vmean:  82.300 cm/s LVOT VTI:    0.237 m  AORTA Ao Root diam: 3.50 cm MITRAL VALVE MV Area (PHT): 3.34 cm     SHUNTS MV Decel Time: 227 msec     Systemic VTI:  0.24 m MV E velocity: 67.30 cm/s   Systemic Diam: 2.00 cm MV A velocity: 113.00  cm/s MV E/A ratio:  0.60 Ena Dawley MD Electronically signed by Ena Dawley MD Signature Date/Time: 02/04/2020/4:28:09 PM    Final    CT HEAD CODE STROKE WO CONTRAST`  Result Date: 02/04/2020 CLINICAL DATA:  Code stroke.  Aphasia. EXAM: CT HEAD WITHOUT CONTRAST TECHNIQUE: Contiguous axial images were obtained from the base of the skull through the vertex without intravenous contrast. COMPARISON:  Brain MRI 07/06/2005 FINDINGS: Brain: No evidence of acute infarction, hemorrhage, obstructive hydrocephalus, extra-axial collection or mass lesion/mass effect. Cerebral volume loss. Ventriculomegaly with disproportionate subarachnoid spaces and some callosal angle narrowing but to a degree not definitive for normal pressure hydrocephalus. Vascular: No asymmetric vessel hyperdensity. Right intracranial ICA stenting. Skull: Normal. Negative for fracture or focal lesion. Sinuses/Orbits: Negative Other: These results were communicated to Dr. Leonel Ramsay at 1:35 pmon 8/22/2021by text page via the [facility] messaging system. ASPECTS [facility] Stroke Program Early CT Score) - Ganglionic level infarction (caudate, lentiform nuclei, internal capsule, insula, M1-M3 cortex): 7 - Supraganglionic infarction (M4-M6 cortex): 3 Total score (0-10 with 10 being normal): 10 IMPRESSION: 1. No acute finding. 2. 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 Electronically Signed   By: Monte Fantasia M.D.   On: 02/04/2020 13:38       Subjective: No new complaints.   Discharge Exam: Vitals:   02/08/20 2308 02/09/20 0800  BP: 125/67 (!) 169/87  Pulse: 60 98  Resp: 18 16  Temp: 98.2 F (36.8 C) 98.2 F (36.8 C)  SpO2: 96% 98%   Vitals:   02/08/20 0750 02/08/20 1558 02/08/20 2308 02/09/20 0800  BP: 134/69 129/89 125/67 (!) 169/87  Pulse: 63 82 60 98  Resp: 16 16 18 16   Temp: 98.2 F (36.8 C) 97.9 F (36.6 C) 98.2 F (36.8 C) 98.2 F (36.8 C)  TempSrc:   Oral Oral   SpO2: 98% 99% 96% 98%    General: Pt is alert, awake, not in acute distress Cardiovascular: RRR, S1/S2 +, no rubs, no gallops Respiratory: CTA bilaterally, no wheezing, no rhonchi Abdominal: Soft, NT, ND, bowel sounds + Extremities: no edema, no cyanosis    The results of significant diagnostics from this hospitalization (including imaging, microbiology, ancillary  and laboratory) are listed below for reference.     Microbiology: Recent Results (from the past 240 hour(s))  SARS Coronavirus 2 by RT PCR (hospital order, performed in [facility] hospital lab) Nasopharyngeal Nasopharyngeal Swab     Status: None   Collection Time: 02/04/20  1:28 PM   Specimen: Nasopharyngeal Swab  Result Value Ref Range Status   SARS Coronavirus 2 NEGATIVE NEGATIVE Final    Comment: (NOTE) SARS-CoV-2 target nucleic acids are NOT DETECTED.  The SARS-CoV-2 RNA is generally detectable in upper and lower respiratory specimens during the acute phase of infection. The lowest concentration of SARS-CoV-2 viral copies this assay can detect is 250 copies / mL. A negative result does not preclude SARS-CoV-2 infection and should not be used as the sole basis for treatment or other patient management decisions.  A negative result may occur with improper specimen collection / handling, submission of specimen other than nasopharyngeal swab, presence of viral mutation(s) within the areas targeted by this assay, and inadequate number of viral copies (<250 copies / mL). A negative result must be combined with clinical observations, patient history, and epidemiological information.  Fact Sheet for Patients:   StrictlyIdeas.no  Fact Sheet for Healthcare Providers: BankingDealers.co.za  This test is not yet approved or  cleared by the [place] FDA and has been authorized for detection and/or diagnosis of SARS-CoV-2 by FDA under an Emergency Use Authorization  (EUA).  This EUA will remain in effect (meaning this test can be used) for the duration of the COVID-19 declaration under Section 564(b)(1) of the Act, 21 U.S.C. section 360bbb-3(b)(1), unless the authorization is terminated or revoked sooner.  Performed at [facility], [place] [address] [place], [place] [address]      Labs: BNP (last 3 results) No results for input(s): BNP in the last 8760 hours. Basic Metabolic Panel: Recent Labs  Lab 02/04/20 1319 02/04/20 1319 02/04/20 1328 02/04/20 1419 02/05/20 0634 02/06/20 0157 02/07/20 0049 02/08/20 0408 02/09/20 0120  NA 127*   < > 128*  --  131* 130* 133* 136  --   K 4.2   < > 4.2  --  3.8 3.5 3.7 3.8  --   CL 92*   < > 94*  --  95* 97* 101 104  --   CO2 21*  --   --   --  23 24 24 23   --   GLUCOSE 154*   < > 156*  --  113* 93 130* 101*  --   BUN 10   < > 10  --  14 16 18 11   --   CREATININE 0.90   < > 0.80  --  0.88 0.97 0.96 0.81  --   CALCIUM 9.5  --   --   --  9.6 9.3 9.0 8.9  --   MG  --   --   --  1.9  --  2.1 2.1 2.0 2.0  PHOS  --   --   --  3.6  --   --   --   --   --    < > = values in this interval not displayed.   Liver Function Tests: Recent Labs  Lab 02/04/20 1319 02/05/20 0634 02/06/20 0157 02/07/20 0049 02/08/20 0408  AST 23 28 46* 40 31  ALT 17 16 20 23 22   ALKPHOS 66 55 50 47 54  BILITOT 1.7* 1.9* 1.6* 0.9 0.8  PROT 7.6 7.4 6.8 6.1* 6.3*  ALBUMIN  4.3 4.1 3.9 3.4* 3.5   No results for input(s): LIPASE, AMYLASE in the last 168 hours. No results for input(s): AMMONIA in the last 168 hours. CBC: Recent Labs  Lab 02/04/20 1319 02/04/20 1328 02/05/20 0634 02/06/20 0157 02/07/20 0049 02/08/20 0408 02/09/20 0120  WBC 12.2*   < > 11.8* 10.4 8.4 7.5 7.6  NEUTROABS 10.6*  --   --   --   --   --   --   HGB 15.2   < > 14.7 14.4 13.6 13.0 13.2  HCT 43.5   < > 41.6 41.2 39.1 37.7* 37.3*  MCV 91.6   < > 93.1 94.3 94.2 93.1 93.5  PLT 231   < > 261 224 207 228 247   < > = values in this interval  not displayed.   Cardiac Enzymes: No results for input(s): CKTOTAL, CKMB, CKMBINDEX, TROPONINI in the last 168 hours. BNP: Invalid input(s): POCBNP CBG: Recent Labs  Lab 02/04/20 1319 02/04/20 1555  GLUCAP 165* 153*   D-Dimer No results for input(s): DDIMER in the last 72 hours. Hgb A1c No results for input(s): HGBA1C in the last 72 hours. Lipid Profile No results for input(s): CHOL, HDL, LDLCALC, TRIG, CHOLHDL, LDLDIRECT in the last 72 hours. Thyroid function studies No results for input(s): TSH, T4TOTAL, T3FREE, THYROIDAB in the last 72 hours.  Invalid input(s): FREET3 Anemia work up No results for input(s): VITAMINB12, FOLATE, FERRITIN, TIBC, IRON, RETICCTPCT in the last 72 hours. Urinalysis    Component Value Date/Time   COLORURINE YELLOW 02/05/2020 1045   APPEARANCEUR CLEAR 02/05/2020 1045   LABSPEC 1.031 (H) 02/05/2020 1045   PHURINE 5.0 02/05/2020 1045   GLUCOSEU NEGATIVE 02/05/2020 1045   HGBUR NEGATIVE 02/05/2020 1045   BILIRUBINUR NEGATIVE 02/05/2020 1045   KETONESUR 20 (A) 02/05/2020 1045   PROTEINUR 30 (A) 02/05/2020 1045   NITRITE NEGATIVE 02/05/2020 1045   LEUKOCYTESUR NEGATIVE 02/05/2020 1045   Sepsis Labs Invalid input(s): PROCALCITONIN,  WBC,  LACTICIDVEN Microbiology Recent Results (from the past 240 hour(s))  SARS Coronavirus 2 by RT PCR (hospital order, performed in [facility] hospital lab) Nasopharyngeal Nasopharyngeal Swab     Status: None   Collection Time: 02/04/20  1:28 PM   Specimen: Nasopharyngeal Swab  Result Value Ref Range Status   SARS Coronavirus 2 NEGATIVE NEGATIVE Final    Comment: (NOTE) SARS-CoV-2 target nucleic acids are NOT DETECTED.  The SARS-CoV-2 RNA is generally detectable in upper and lower respiratory specimens during the acute phase of infection. The lowest concentration of SARS-CoV-2 viral copies this assay can detect is 250 copies / mL. A negative result does not preclude SARS-CoV-2 infection and should not be  used as the sole basis for treatment or other patient management decisions.  A negative result may occur with improper specimen collection / handling, submission of specimen other than nasopharyngeal swab, presence of viral mutation(s) within the areas targeted by this assay, and inadequate number of viral copies (<250 copies / mL). A negative result must be combined with clinical observations, patient history, and epidemiological information.  Fact Sheet for Patients:   StrictlyIdeas.no  Fact Sheet for Healthcare Providers: BankingDealers.co.za  This test is not yet approved or  cleared by the [place] FDA and has been authorized for detection and/or diagnosis of SARS-CoV-2 by FDA under an Emergency Use Authorization (EUA).  This EUA will remain in effect (meaning this test can be used) for the duration of the COVID-19 declaration under Section 564(b)(1) of the Act,  21 U.S.C. section 360bbb-3(b)(1), unless the authorization is terminated or revoked sooner.  Performed at [facility], [place] [address], [place], [place] [address]      Time coordinating discharge: 35 minutes   SIGNED:   Hosie Poisson, MD  [facility] 02/09/2020, 8:48 AM

## 2020-02-09 NOTE — TOC Transition Note (Signed)
Transition of Care [facility]) - CM/SW Discharge Note   Patient Details  Name: CORINTHIAN MIZRAHI MRN: [id number] Date of Birth: 1941-03-17  [facility] CM/SW Contact:  Joanne Chars, LCSW Phone Number: 02/09/2020, 10:18 AM   Clinical Narrative:   Pt going to Room 417A.  RN call report to [phone].    Final next level of care: Skilled Nursing Facility Barriers to Discharge: Barriers Resolved   Patient Goals and CMS Choice Patient states their goals for this hospitalization and ongoing recovery are:: stay healthy CMS Medicare.gov Compare Post Acute Care list provided to:: Patient Choice offered to / list presented to : Patient  Discharge Placement PASRR number recieved: 02/08/20            Patient chooses bed at: [facility] Patient to be transferred to facility by: [facility] Name of family member notified: [name] ,Wife Patient and family notified of of transfer: 02/09/20  Discharge Plan and Services     Post Acute Care Choice: [place]                               Social Determinants of Health (SDOH) Interventions     Readmission Risk Interventions No flowsheet data found.

## 2020-02-09 NOTE — Progress Notes (Signed)
Physical Therapy Treatment Patient Details Name: Carlos Wise MRN: [id number] DOB: Mar 02, 1941 Today's Date: 02/09/2020    History of Present Illness 79 year old male with history of HTN, HLD, admitted to the hospital for confusion and dysarthria which was noted by his wife.  Upon admission CT of the head, CTA head and neck and MRI brain were negative    PT Comments    Patient was received sitting up in chair on room air upon entry. Nurse tech came in at start of the session to let me know that he has been getting up and moving around his room without staff present. Gave patient a face mask to wear in hallway to which he replied, "I'd feel safer if you gave me some gloves too." When asked why he said "the place I'm going to is dirty" and proceeded to make inappropriate racial comments. Patient wore gloves throughout physical therapy session today as well. Patient transferred to standing and ambulated 160' in hallway with RW. Needed repeated cueing to keep RW on the ground as he was lifting it up in the air throughout the session. Patient appeared to be annoyed with SPT giving verbal cues throughout session for fall prevention. He was left sitting up in chair with chair alarm set and all needs within reach. Educated patient not to get up without staff member present.    Follow Up Recommendations  SNF     Equipment Recommendations  Other (comment);Rolling walker with 5" wheels    Recommendations for Other Services       Precautions / Restrictions Precautions Precautions: Fall Restrictions Weight Bearing Restrictions: No    Mobility  Bed Mobility               General bed mobility comments: up in chair on entry  Transfers   Equipment used: Rolling walker (2 wheeled) Transfers: Sit to/from Stand Sit to Stand: Min guard         General transfer comment: min guard for STS w/ RW for safety  Ambulation/Gait Ambulation/Gait assistance: Min assist Gait Distance (Feet): 160  Feet Assistive device: Rolling walker (2 wheeled) Gait Pattern/deviations: Step-through pattern;Trunk flexed Gait velocity: slower   General Gait Details: continued to pick up RW on turns and while walking in hallway. Needed verbal cueing to keep RW on the ground. Appeared to be annoyed with PT giving verbal cues to keep RW on ground for fall prevention   Stairs             Wheelchair Mobility    Modified Rankin (Stroke Patients Only)       Balance   Sitting-balance support: Feet unsupported;No upper extremity supported Sitting balance-Leahy Scale: Good Sitting balance - Comments: sat in chair with no LOB   Standing balance support: Bilateral upper extremity supported;During functional activity Standing balance-Leahy Scale: Fair Standing balance comment: better balance today, but still needed RW for gait training.                            Cognition Arousal/Alertness: Awake/alert Behavior During Therapy: WFL for tasks assessed/performed Overall Cognitive Status: Impaired/Different from baseline Area of Impairment: Following commands;Safety/judgement;Awareness;Problem solving;Attention                   Current Attention Level: Selective   Following Commands: Follows one step commands with increased time;Follows one step commands consistently Safety/Judgement: Decreased awareness of safety;Decreased awareness of deficits Awareness: Emergent Problem Solving: Slow processing;Decreased initiation;Difficulty sequencing;Requires verbal  cues;Requires tactile cues General Comments: Pt with slow processing, confusion, and inconsistent following of commands.      Exercises      General Comments        Pertinent Vitals/Pain Pain Assessment: No/denies pain    Home Living                      Prior Function            PT Goals (current goals can now be found in the care plan section) Acute Rehab PT Goals Patient Stated Goal: per wife  to figure out what is wrong with her husband PT Goal Formulation: With patient/family Time For Goal Achievement: 02/20/20 Potential to Achieve Goals: Good Progress towards PT goals: Progressing toward goals    Frequency    Min 3X/week      PT Plan Current plan remains appropriate    Co-evaluation              AM-PAC PT "6 Clicks" Mobility   Outcome Measure  Help needed turning from your back to your side while in a flat bed without using bedrails?: A Little Help needed moving from lying on your back to sitting on the side of a flat bed without using bedrails?: A Little Help needed moving to and from a bed to a chair (including a wheelchair)?: A Little Help needed standing up from a chair using your arms (e.g., wheelchair or bedside chair)?: A Little Help needed to walk in hospital room?: A Little   6 Click Score: 15    End of Session Equipment Utilized During Treatment: Gait belt Activity Tolerance: Patient tolerated treatment well Patient left: in chair;with call bell/phone within reach;with chair alarm set Nurse Communication: Mobility status PT Visit Diagnosis: Unsteadiness on feet (R26.81);Muscle weakness (generalized) (M62.81)     Time:  -     Charges:                       Livingston Diones, SPT, ATC

## 2020-02-13 DIAGNOSIS — R69 Illness, unspecified: Secondary | ICD-10-CM | POA: Diagnosis not present

## 2020-02-13 DIAGNOSIS — R569 Unspecified convulsions: Secondary | ICD-10-CM | POA: Diagnosis not present

## 2020-02-13 DIAGNOSIS — Z79899 Other long term (current) drug therapy: Secondary | ICD-10-CM | POA: Diagnosis not present

## 2020-02-13 DIAGNOSIS — G629 Polyneuropathy, unspecified: Secondary | ICD-10-CM | POA: Diagnosis not present

## 2020-02-13 DIAGNOSIS — E7800 Pure hypercholesterolemia, unspecified: Secondary | ICD-10-CM | POA: Diagnosis not present

## 2020-02-13 DIAGNOSIS — K219 Gastro-esophageal reflux disease without esophagitis: Secondary | ICD-10-CM | POA: Diagnosis not present

## 2020-02-13 DIAGNOSIS — I10 Essential (primary) hypertension: Secondary | ICD-10-CM | POA: Diagnosis not present

## 2020-02-13 DIAGNOSIS — Z6828 Body mass index (BMI) 28.0-28.9, adult: Secondary | ICD-10-CM | POA: Diagnosis not present

## 2020-02-13 DIAGNOSIS — R29898 Other symptoms and signs involving the musculoskeletal system: Secondary | ICD-10-CM | POA: Diagnosis not present

## 2020-02-21 DIAGNOSIS — R339 Retention of urine, unspecified: Secondary | ICD-10-CM | POA: Diagnosis not present

## 2020-03-20 ENCOUNTER — Encounter: Payer: Self-pay | Admitting: Adult Health

## 2020-03-20 ENCOUNTER — Ambulatory Visit: Payer: Medicare HMO | Admitting: Adult Health

## 2020-03-20 VITALS — BP 122/80 | HR 84 | Ht 68.0 in | Wt 185.0 lb

## 2020-03-20 DIAGNOSIS — I10 Essential (primary) hypertension: Secondary | ICD-10-CM | POA: Diagnosis not present

## 2020-03-20 DIAGNOSIS — R569 Unspecified convulsions: Secondary | ICD-10-CM

## 2020-03-20 DIAGNOSIS — R2990 Unspecified symptoms and signs involving the nervous system: Secondary | ICD-10-CM | POA: Diagnosis not present

## 2020-03-20 DIAGNOSIS — R2689 Other abnormalities of gait and mobility: Secondary | ICD-10-CM

## 2020-03-20 DIAGNOSIS — E785 Hyperlipidemia, unspecified: Secondary | ICD-10-CM

## 2020-03-20 MED ORDER — LEVETIRACETAM ER 500 MG PO TB24: 1000.0000 mg | ORAL_TABLET | Freq: Every day | ORAL | 6 refills | Status: DC

## 2020-03-20 MED ORDER — ASPIRIN EC 81 MG PO TBEC: 81.0000 mg | DELAYED_RELEASE_TABLET | Freq: Every day | ORAL | 11 refills | Status: DC

## 2020-03-20 NOTE — Progress Notes (Signed)
[facility] [address] [place]. [place] [address] [phone]       HOSPITAL FOLLOW UP NOTE  [name] Date of Birth:  June 11, 1941 Medical Record Number:  [id number]   Reason for Referral:  hospital stroke follow up    SUBJECTIVE:   CHIEF COMPLAINT:  Chief Complaint  Patient presents with  . Hospitalization Follow-up    tx rm  . Cerebrovascular Accident    pt said he di nothave a stroke but had a seizure. Pt has no new sx    HPI:   [name] is a 79 y.o. male with history of HTN, HLD who woke w/ confusion. Presented on 02/04/2020 with acute aphasia. LKW Saturday night before went to bed. Woke up Sunday 4am with speech difficulty and gait impairment.  Stroke work up negative and symptoms likely seizure with prolonged post ictal state.  EEG and LTM EEG no seizures partial left temporal sharp waves and left hemisphere slowing.  Loaded with keppra and discharged on keppra 500mg  BID.  Found to have hyponatremia with possible etiology for seizures with admission Na 127.  On aspirin 81mg  PTA and recommend increasing dose to 325mg  daily. HTN stable.  LDL 93 previously on simvastatin and switched to atorvastatin 80 mg daily.  Other stroke risk factors include advanced age, former tobacco use and EtOH use.  Other active problems include hx of prostate CA s/p XRT, hx of herpes zoster, hx R ICA aneurysm s/p coiling, prolonged QTC, GERD and peripheral neuropathy.  Evaluated by therapy and recommended discharge to SNF for ongoing therapy needs   Likely seizure with prolonged post ictal - resolved  Code Stroke CT head No acute abnormality. Small vessel disease. Atrophy. Ventriculomegaly progressed since 2007. ASPECTS 10.     CTA head & neck atherosclerosis throughout. Hx stent assisted R ICA aneurysm coiling.  CT perfusion no core or penumbra  MRI  No acute abnormality. Small vessel disease. Atrophy.   2D Echo EF 60-65%. No source of embolus   EEG and  LTM EEG - no sz, L temporal sharp waves and left hemisphere slowing  Keppra load 2gm followed by 500 bid - continue on discharge  LDL 193  HgbA1c 5.6  VTE prophylaxis - Lovenox 40 mg sq daily   aspirin 81 mg daily prior to admission, now on aspirin 325mg  daily.   Therapy recommendations:  SNF  Disposition:   [facility]  Today, 03/20/2020, Mr. [name] is being seen for hospital follow-up accompanied by his wife. Per patient and wife, they arrived to SNF but instantly " turned around and drove home".  He has not had any reoccurring seizure activity or events or stroke/TIA symptoms since returning home.  He does continue to experience imbalance and occasional dizziness which he reports is at baseline.  He is not currently using assistive device for ambulation.  He is requesting home health PT.  He remains on Keppra 500 mg twice daily reporting increased daytime fatigue but otherwise tolerating well.  He denies previously being on aspirin or starting aspirin at discharge as well as not starting atorvastatin 80 mg daily as advised.  Blood pressure today 122/80.  No concerns at this time.     ROS:   14 system review of systems performed and negative with exception of imbalance  PMH:  Past Medical History:  Diagnosis Date  . Herpes zoster   . Hyperlipemia   . Hypertension   . Intestinal polyp    hyperplastic  .  Prostate cancer ([facility])   . S/P radiation therapy 12/12/2014 through 02/06/2015    Prostate 7800 cGy in 40 sessions, seminal vesicles 5600 cGy in 40 sessions     PSH:  Past Surgical History:  Procedure Laterality Date  . Carotid Artery Catherization    . PROSTATE BIOPSY  06/27/2014    Social History:  Social History   Socioeconomic History  . Marital status: Married    Spouse name: Not on file  . Number of children: 0  . Years of education: Not on file  . Highest education level: Not on file   Occupational History  . Occupation: Retired  Tobacco Use  . Smoking status: Former Research scientist (life sciences)  . Smokeless tobacco: Never Used  . Tobacco comment: Quit 30 years ago  Substance and Sexual Activity  . Alcohol use: Yes    Alcohol/week: 0.0 standard drinks    Comment: 3 oz daily  . Drug use: No  . Sexual activity: Not on file  Other Topics Concern  . Not on file  Social History Narrative  . Not on file   Social Determinants of Health   Financial Resource Strain:   . Difficulty of Paying Living Expenses: Not on file  Food Insecurity:   . Worried About Charity fundraiser in the Last Year: Not on file  . Ran Out of Food in the Last Year: Not on file  Transportation Needs:   . Lack of Transportation (Medical): Not on file  . Lack of Transportation (Non-Medical): Not on file  Physical Activity:   . Days of Exercise per Week: Not on file  . Minutes of Exercise per Session: Not on file  Stress:   . Feeling of Stress : Not on file  Social Connections:   . Frequency of Communication with Friends and Family: Not on file  . Frequency of Social Gatherings with Friends and Family: Not on file  . Attends Religious Services: Not on file  . Active Member of Clubs or Organizations: Not on file  . Attends Archivist Meetings: Not on file  . Marital Status: Not on file  Intimate Partner Violence:   . Fear of Current or Ex-Partner: Not on file  . Emotionally Abused: Not on file  . Physically Abused: Not on file  . Sexually Abused: Not on file    Family History:  Family History  Problem Relation Age of Onset  . Colon cancer Mother   . Prostate cancer Father   . Heart attack Father     Medications:   Current Outpatient Medications on File Prior to Visit  Medication Sig Dispense Refill  . amLODipine (NORVASC) 2.5 MG tablet Take 2.5 mg by mouth daily.    Marland Kitchen aspirin 325 MG tablet Take 1 tablet (325 mg total) by mouth daily. 30 tablet 1  . Cyanocobalamin (VITAMIN B-12 ER PO) Take  1 tablet by mouth daily.     Marland Kitchen escitalopram (LEXAPRO) 20 MG tablet Take 20 mg by mouth daily.    . fluticasone (FLONASE) 50 MCG/ACT nasal spray Place 2 sprays into both nostrils daily as needed for allergies or rhinitis.     Marland Kitchen gabapentin (NEURONTIN) 300 MG capsule Take 300 mg by mouth See admin instructions. Take 300 mg by mouth one to two times a day    . levETIRAcetam (KEPPRA) 500 MG tablet Take 1 tablet (500 mg total) by mouth 2 (two) times daily. 60 tablet 1  . Multiple Vitamins-Minerals (CENTRUM ADULTS PO) Take 1 tablet by  mouth daily.     . pantoprazole (PROTONIX) 40 MG tablet Take 40 mg by mouth daily before breakfast.     . polyethylene glycol (MIRALAX / GLYCOLAX) 17 g packet Take 17 g by mouth daily as needed for severe constipation. 14 each 0   No current facility-administered medications on file prior to visit.    Allergies:   Allergies  Allergen Reactions  . Penicillins Other (See Comments)    Patient states no allergy, but has preference that he does not want this medication      OBJECTIVE:  Physical Exam  Vitals:   03/20/20 1523  BP: 122/80  Pulse: 84  Weight: 185 lb (83.9 kg)  Height: 5\' 8"  (1.727 m)   Body mass index is 28.13 kg/m. No exam data present  General: well developed, well nourished,  pleasant elderly Caucasian male, seated, in no evident distress Head: head normocephalic and atraumatic.   Neck: supple with no carotid or supraclavicular bruits Cardiovascular: regular rate and rhythm, no murmurs Musculoskeletal: no deformity Skin:  no rash/petichiae Vascular:  Normal pulses all extremities   Neurologic Exam Mental Status: Awake and fully alert.   Fluent speech and language.  Oriented to place and time. Recent and remote memory intact. Attention span, concentration and fund of knowledge appropriate. Mood and affect appropriate.  Cranial Nerves: Fundoscopic exam reveals sharp disc margins. Pupils equal, briskly reactive to light. Extraocular  movements full without nystagmus. Visual fields full to confrontation. Hearing intact. Facial sensation intact. Face, tongue, palate moves normally and symmetrically.  Motor: Normal bulk and tone. Normal strength in all tested extremity muscles. Sensory.: intact to touch , pinprick , position and vibratory sensation.  Coordination: Rapid alternating movements normal in all extremities. Finger-to-nose and heel-to-shin performed accurately bilaterally. Gait and Station: Arises from chair without difficulty. Stance is normal. Gait demonstrates normal stride length with imbalance and gait unsteadiness greater with turning.  Unable to perform tandem walk or heel and toe walk due to imbalance.  Romberg positive. Reflexes: 1+ and symmetric. Toes downgoing.       ASSESSMENT/PLAN: Carlos Wise is a 79 y.o. year old male presented with acute aphasia and confusion on 02/04/2020 with stroke work-up unremarkable and likely seizure with prolonged postictal state. Vascular risk factors include HTN, HLD, hyponatremia during admission, advanced age, former tobacco use, EtOH use and chronic diastolic CHF.      1. Seizure:  a. No reoccurring seizure activity or event.   b. Possibly secondary to hyponatremia c. Does report increased fatigue on Keppra IR 500 mg twice daily therefore recommend trialing Keppra XR 1000mg  nightly d. Recommend continuing AED for at least 71-month duration and then may consider weaning him off after that time e. Discussed no driving for 6-month duration post seizure activity per [place] law 2. Hyponatremia: a. ??  Seizure etiology b. Na level at discharge 136 -plans on having repeat level with PCP 3. Imbalance: Chronic per patient but did place order for home health PT as he will likely benefit from assistive device especially with longer duration ambulation and uneven ground 4. Strokelike symptoms: Negative stroke work-up but recommend initiating aspirin 80 mg daily for stroke prevention.   Declines interest in initiating atorvastatin 80 mg daily and plans on further discussing with his PCP.  Discussed stroke prevention measures and importance of close PCP follow up for aggressive stroke risk factor management  5. HTN: BP goal <130/90.  Stable.  On amlodipine per PCP 6. HLD: LDL goal <70. Recent LDL  193.  Declines use of atorvastatin or any statin at this time.  He plans on discussing further with his PCP which was highly encouraged    Follow up in 4 months or call earlier if needed   I spent 45 minutes of face-to-face and non-face-to-face time with patient and wife.  This included previsit chart review including recent hospitalization pertinent progress notes, labs and imaging, lab review, study review, order entry, electronic health record documentation, patient education regarding recent stroke like episode likely seizure with postictal state, possible seizure etiology and ongoing use of AED, importance of managing stroke risk factors and answered all questions to patient satisfaction   Frann Rider, AGNP-BC  [facility] [address] [place], [place] [address]  Phone [phone] Fax [phone] Note: This document was prepared with digital dictation and possible smart phrase technology. Any transcriptional errors that result from this process are unintentional.

## 2020-03-20 NOTE — Patient Instructions (Addendum)
Your Plan:  Start aspirin 81mg  daily for stroke prevention  Change keppra to extended release formulation - take 2 500mg  capsules of extended release keppra at bedtime hopefully decreasing your day time fatigue  Referral placed to home health therapy physical therapy for imbalance and gait unsteadiness     Follow up in 4 months or call earlier if needed     Thank you for coming to see [place] at [facility]. I hope we have been able to provide you high quality care today.  You may receive a patient satisfaction survey over the next few weeks. We would appreciate your feedback and comments so that we may continue to improve ourselves and the health of our patients.

## 2020-03-21 NOTE — Progress Notes (Signed)
I agree with the above plan 

## 2020-03-26 ENCOUNTER — Telehealth: Payer: Self-pay | Admitting: Adult Health

## 2020-03-26 DIAGNOSIS — I10 Essential (primary) hypertension: Secondary | ICD-10-CM | POA: Diagnosis not present

## 2020-03-26 DIAGNOSIS — Z7982 Long term (current) use of aspirin: Secondary | ICD-10-CM | POA: Diagnosis not present

## 2020-03-26 DIAGNOSIS — E785 Hyperlipidemia, unspecified: Secondary | ICD-10-CM | POA: Diagnosis not present

## 2020-03-26 DIAGNOSIS — G629 Polyneuropathy, unspecified: Secondary | ICD-10-CM | POA: Diagnosis not present

## 2020-03-26 DIAGNOSIS — Z8546 Personal history of malignant neoplasm of prostate: Secondary | ICD-10-CM | POA: Diagnosis not present

## 2020-03-26 DIAGNOSIS — Z8673 Personal history of transient ischemic attack (TIA), and cerebral infarction without residual deficits: Secondary | ICD-10-CM | POA: Diagnosis not present

## 2020-03-26 DIAGNOSIS — R569 Unspecified convulsions: Secondary | ICD-10-CM | POA: Diagnosis not present

## 2020-03-26 DIAGNOSIS — K219 Gastro-esophageal reflux disease without esophagitis: Secondary | ICD-10-CM | POA: Diagnosis not present

## 2020-03-26 DIAGNOSIS — I110 Hypertensive heart disease with heart failure: Secondary | ICD-10-CM | POA: Diagnosis not present

## 2020-03-26 DIAGNOSIS — I5032 Chronic diastolic (congestive) heart failure: Secondary | ICD-10-CM | POA: Diagnosis not present

## 2020-03-26 DIAGNOSIS — E871 Hypo-osmolality and hyponatremia: Secondary | ICD-10-CM | POA: Diagnosis not present

## 2020-03-26 NOTE — Telephone Encounter (Signed)
I called and LMVM for Mark PT with [facility] that it is ok to do orders for PT as listed per JM/NP.

## 2020-03-26 NOTE — Telephone Encounter (Signed)
Mark from [facility] called needing VO for: PT: 1 week 1  2 week 4   1 week 2 Starting today. Please advise. VM can be left if Elta [place] does not pick up.

## 2020-03-26 NOTE — Telephone Encounter (Signed)
Noted! Thank you

## 2020-04-01 DIAGNOSIS — I5032 Chronic diastolic (congestive) heart failure: Secondary | ICD-10-CM | POA: Diagnosis not present

## 2020-04-01 DIAGNOSIS — E871 Hypo-osmolality and hyponatremia: Secondary | ICD-10-CM | POA: Diagnosis not present

## 2020-04-01 DIAGNOSIS — R569 Unspecified convulsions: Secondary | ICD-10-CM | POA: Diagnosis not present

## 2020-04-01 DIAGNOSIS — Z7982 Long term (current) use of aspirin: Secondary | ICD-10-CM | POA: Diagnosis not present

## 2020-04-01 DIAGNOSIS — Z8546 Personal history of malignant neoplasm of prostate: Secondary | ICD-10-CM | POA: Diagnosis not present

## 2020-04-01 DIAGNOSIS — K219 Gastro-esophageal reflux disease without esophagitis: Secondary | ICD-10-CM | POA: Diagnosis not present

## 2020-04-01 DIAGNOSIS — E785 Hyperlipidemia, unspecified: Secondary | ICD-10-CM | POA: Diagnosis not present

## 2020-04-01 DIAGNOSIS — G629 Polyneuropathy, unspecified: Secondary | ICD-10-CM | POA: Diagnosis not present

## 2020-04-01 DIAGNOSIS — I110 Hypertensive heart disease with heart failure: Secondary | ICD-10-CM | POA: Diagnosis not present

## 2020-04-01 DIAGNOSIS — Z8673 Personal history of transient ischemic attack (TIA), and cerebral infarction without residual deficits: Secondary | ICD-10-CM | POA: Diagnosis not present

## 2020-04-04 DIAGNOSIS — R339 Retention of urine, unspecified: Secondary | ICD-10-CM | POA: Diagnosis not present

## 2020-04-05 DIAGNOSIS — Z8673 Personal history of transient ischemic attack (TIA), and cerebral infarction without residual deficits: Secondary | ICD-10-CM | POA: Diagnosis not present

## 2020-04-05 DIAGNOSIS — Z7982 Long term (current) use of aspirin: Secondary | ICD-10-CM | POA: Diagnosis not present

## 2020-04-05 DIAGNOSIS — G629 Polyneuropathy, unspecified: Secondary | ICD-10-CM | POA: Diagnosis not present

## 2020-04-05 DIAGNOSIS — I5032 Chronic diastolic (congestive) heart failure: Secondary | ICD-10-CM | POA: Diagnosis not present

## 2020-04-05 DIAGNOSIS — K219 Gastro-esophageal reflux disease without esophagitis: Secondary | ICD-10-CM | POA: Diagnosis not present

## 2020-04-05 DIAGNOSIS — R569 Unspecified convulsions: Secondary | ICD-10-CM | POA: Diagnosis not present

## 2020-04-05 DIAGNOSIS — I110 Hypertensive heart disease with heart failure: Secondary | ICD-10-CM | POA: Diagnosis not present

## 2020-04-05 DIAGNOSIS — E871 Hypo-osmolality and hyponatremia: Secondary | ICD-10-CM | POA: Diagnosis not present

## 2020-04-05 DIAGNOSIS — Z8546 Personal history of malignant neoplasm of prostate: Secondary | ICD-10-CM | POA: Diagnosis not present

## 2020-04-05 DIAGNOSIS — E785 Hyperlipidemia, unspecified: Secondary | ICD-10-CM | POA: Diagnosis not present

## 2020-04-08 DIAGNOSIS — I5032 Chronic diastolic (congestive) heart failure: Secondary | ICD-10-CM | POA: Diagnosis not present

## 2020-04-08 DIAGNOSIS — Z7982 Long term (current) use of aspirin: Secondary | ICD-10-CM | POA: Diagnosis not present

## 2020-04-08 DIAGNOSIS — E871 Hypo-osmolality and hyponatremia: Secondary | ICD-10-CM | POA: Diagnosis not present

## 2020-04-08 DIAGNOSIS — I110 Hypertensive heart disease with heart failure: Secondary | ICD-10-CM | POA: Diagnosis not present

## 2020-04-08 DIAGNOSIS — G629 Polyneuropathy, unspecified: Secondary | ICD-10-CM | POA: Diagnosis not present

## 2020-04-08 DIAGNOSIS — R569 Unspecified convulsions: Secondary | ICD-10-CM | POA: Diagnosis not present

## 2020-04-08 DIAGNOSIS — Z8673 Personal history of transient ischemic attack (TIA), and cerebral infarction without residual deficits: Secondary | ICD-10-CM | POA: Diagnosis not present

## 2020-04-08 DIAGNOSIS — E785 Hyperlipidemia, unspecified: Secondary | ICD-10-CM | POA: Diagnosis not present

## 2020-04-08 DIAGNOSIS — K219 Gastro-esophageal reflux disease without esophagitis: Secondary | ICD-10-CM | POA: Diagnosis not present

## 2020-04-08 DIAGNOSIS — Z8546 Personal history of malignant neoplasm of prostate: Secondary | ICD-10-CM | POA: Diagnosis not present

## 2020-04-10 DIAGNOSIS — E871 Hypo-osmolality and hyponatremia: Secondary | ICD-10-CM | POA: Diagnosis not present

## 2020-04-10 DIAGNOSIS — K219 Gastro-esophageal reflux disease without esophagitis: Secondary | ICD-10-CM | POA: Diagnosis not present

## 2020-04-10 DIAGNOSIS — Z7982 Long term (current) use of aspirin: Secondary | ICD-10-CM | POA: Diagnosis not present

## 2020-04-10 DIAGNOSIS — G629 Polyneuropathy, unspecified: Secondary | ICD-10-CM | POA: Diagnosis not present

## 2020-04-10 DIAGNOSIS — I5032 Chronic diastolic (congestive) heart failure: Secondary | ICD-10-CM | POA: Diagnosis not present

## 2020-04-10 DIAGNOSIS — R569 Unspecified convulsions: Secondary | ICD-10-CM | POA: Diagnosis not present

## 2020-04-10 DIAGNOSIS — I110 Hypertensive heart disease with heart failure: Secondary | ICD-10-CM | POA: Diagnosis not present

## 2020-04-10 DIAGNOSIS — E785 Hyperlipidemia, unspecified: Secondary | ICD-10-CM | POA: Diagnosis not present

## 2020-04-10 DIAGNOSIS — Z8546 Personal history of malignant neoplasm of prostate: Secondary | ICD-10-CM | POA: Diagnosis not present

## 2020-04-10 DIAGNOSIS — Z8673 Personal history of transient ischemic attack (TIA), and cerebral infarction without residual deficits: Secondary | ICD-10-CM | POA: Diagnosis not present

## 2020-04-15 DIAGNOSIS — Z8546 Personal history of malignant neoplasm of prostate: Secondary | ICD-10-CM | POA: Diagnosis not present

## 2020-04-15 DIAGNOSIS — R569 Unspecified convulsions: Secondary | ICD-10-CM | POA: Diagnosis not present

## 2020-04-15 DIAGNOSIS — I110 Hypertensive heart disease with heart failure: Secondary | ICD-10-CM | POA: Diagnosis not present

## 2020-04-15 DIAGNOSIS — K219 Gastro-esophageal reflux disease without esophagitis: Secondary | ICD-10-CM | POA: Diagnosis not present

## 2020-04-15 DIAGNOSIS — Z8673 Personal history of transient ischemic attack (TIA), and cerebral infarction without residual deficits: Secondary | ICD-10-CM | POA: Diagnosis not present

## 2020-04-15 DIAGNOSIS — G629 Polyneuropathy, unspecified: Secondary | ICD-10-CM | POA: Diagnosis not present

## 2020-04-15 DIAGNOSIS — E871 Hypo-osmolality and hyponatremia: Secondary | ICD-10-CM | POA: Diagnosis not present

## 2020-04-15 DIAGNOSIS — Z7982 Long term (current) use of aspirin: Secondary | ICD-10-CM | POA: Diagnosis not present

## 2020-04-15 DIAGNOSIS — E785 Hyperlipidemia, unspecified: Secondary | ICD-10-CM | POA: Diagnosis not present

## 2020-04-15 DIAGNOSIS — I5032 Chronic diastolic (congestive) heart failure: Secondary | ICD-10-CM | POA: Diagnosis not present

## 2020-04-18 DIAGNOSIS — I5032 Chronic diastolic (congestive) heart failure: Secondary | ICD-10-CM | POA: Diagnosis not present

## 2020-04-18 DIAGNOSIS — G629 Polyneuropathy, unspecified: Secondary | ICD-10-CM | POA: Diagnosis not present

## 2020-04-18 DIAGNOSIS — R569 Unspecified convulsions: Secondary | ICD-10-CM | POA: Diagnosis not present

## 2020-04-18 DIAGNOSIS — Z7982 Long term (current) use of aspirin: Secondary | ICD-10-CM | POA: Diagnosis not present

## 2020-04-18 DIAGNOSIS — E871 Hypo-osmolality and hyponatremia: Secondary | ICD-10-CM | POA: Diagnosis not present

## 2020-04-18 DIAGNOSIS — K219 Gastro-esophageal reflux disease without esophagitis: Secondary | ICD-10-CM | POA: Diagnosis not present

## 2020-04-18 DIAGNOSIS — Z8673 Personal history of transient ischemic attack (TIA), and cerebral infarction without residual deficits: Secondary | ICD-10-CM | POA: Diagnosis not present

## 2020-04-18 DIAGNOSIS — Z8546 Personal history of malignant neoplasm of prostate: Secondary | ICD-10-CM | POA: Diagnosis not present

## 2020-04-18 DIAGNOSIS — I110 Hypertensive heart disease with heart failure: Secondary | ICD-10-CM | POA: Diagnosis not present

## 2020-04-18 DIAGNOSIS — E785 Hyperlipidemia, unspecified: Secondary | ICD-10-CM | POA: Diagnosis not present

## 2020-04-22 DIAGNOSIS — Z7982 Long term (current) use of aspirin: Secondary | ICD-10-CM | POA: Diagnosis not present

## 2020-04-22 DIAGNOSIS — R569 Unspecified convulsions: Secondary | ICD-10-CM | POA: Diagnosis not present

## 2020-04-22 DIAGNOSIS — Z8546 Personal history of malignant neoplasm of prostate: Secondary | ICD-10-CM | POA: Diagnosis not present

## 2020-04-22 DIAGNOSIS — I110 Hypertensive heart disease with heart failure: Secondary | ICD-10-CM | POA: Diagnosis not present

## 2020-04-22 DIAGNOSIS — E785 Hyperlipidemia, unspecified: Secondary | ICD-10-CM | POA: Diagnosis not present

## 2020-04-22 DIAGNOSIS — G629 Polyneuropathy, unspecified: Secondary | ICD-10-CM | POA: Diagnosis not present

## 2020-04-22 DIAGNOSIS — K219 Gastro-esophageal reflux disease without esophagitis: Secondary | ICD-10-CM | POA: Diagnosis not present

## 2020-04-22 DIAGNOSIS — E871 Hypo-osmolality and hyponatremia: Secondary | ICD-10-CM | POA: Diagnosis not present

## 2020-04-22 DIAGNOSIS — I5032 Chronic diastolic (congestive) heart failure: Secondary | ICD-10-CM | POA: Diagnosis not present

## 2020-04-22 DIAGNOSIS — Z8673 Personal history of transient ischemic attack (TIA), and cerebral infarction without residual deficits: Secondary | ICD-10-CM | POA: Diagnosis not present

## 2020-04-24 DIAGNOSIS — I5032 Chronic diastolic (congestive) heart failure: Secondary | ICD-10-CM | POA: Diagnosis not present

## 2020-04-24 DIAGNOSIS — Z8673 Personal history of transient ischemic attack (TIA), and cerebral infarction without residual deficits: Secondary | ICD-10-CM | POA: Diagnosis not present

## 2020-04-24 DIAGNOSIS — Z6828 Body mass index (BMI) 28.0-28.9, adult: Secondary | ICD-10-CM | POA: Diagnosis not present

## 2020-04-24 DIAGNOSIS — K219 Gastro-esophageal reflux disease without esophagitis: Secondary | ICD-10-CM | POA: Diagnosis not present

## 2020-04-24 DIAGNOSIS — E871 Hypo-osmolality and hyponatremia: Secondary | ICD-10-CM | POA: Diagnosis not present

## 2020-04-24 DIAGNOSIS — G629 Polyneuropathy, unspecified: Secondary | ICD-10-CM | POA: Diagnosis not present

## 2020-04-24 DIAGNOSIS — R29898 Other symptoms and signs involving the musculoskeletal system: Secondary | ICD-10-CM | POA: Diagnosis not present

## 2020-04-24 DIAGNOSIS — Z8546 Personal history of malignant neoplasm of prostate: Secondary | ICD-10-CM | POA: Diagnosis not present

## 2020-04-24 DIAGNOSIS — Z7982 Long term (current) use of aspirin: Secondary | ICD-10-CM | POA: Diagnosis not present

## 2020-04-24 DIAGNOSIS — I110 Hypertensive heart disease with heart failure: Secondary | ICD-10-CM | POA: Diagnosis not present

## 2020-04-24 DIAGNOSIS — M25511 Pain in right shoulder: Secondary | ICD-10-CM | POA: Diagnosis not present

## 2020-04-24 DIAGNOSIS — E785 Hyperlipidemia, unspecified: Secondary | ICD-10-CM | POA: Diagnosis not present

## 2020-04-24 DIAGNOSIS — S4991XA Unspecified injury of right shoulder and upper arm, initial encounter: Secondary | ICD-10-CM | POA: Diagnosis not present

## 2020-04-24 DIAGNOSIS — R569 Unspecified convulsions: Secondary | ICD-10-CM | POA: Diagnosis not present

## 2020-04-24 DIAGNOSIS — Z23 Encounter for immunization: Secondary | ICD-10-CM | POA: Diagnosis not present

## 2020-04-25 DIAGNOSIS — Z8546 Personal history of malignant neoplasm of prostate: Secondary | ICD-10-CM | POA: Diagnosis not present

## 2020-04-25 DIAGNOSIS — E871 Hypo-osmolality and hyponatremia: Secondary | ICD-10-CM | POA: Diagnosis not present

## 2020-04-25 DIAGNOSIS — I110 Hypertensive heart disease with heart failure: Secondary | ICD-10-CM | POA: Diagnosis not present

## 2020-04-25 DIAGNOSIS — G629 Polyneuropathy, unspecified: Secondary | ICD-10-CM | POA: Diagnosis not present

## 2020-04-25 DIAGNOSIS — Z7982 Long term (current) use of aspirin: Secondary | ICD-10-CM | POA: Diagnosis not present

## 2020-04-25 DIAGNOSIS — K219 Gastro-esophageal reflux disease without esophagitis: Secondary | ICD-10-CM | POA: Diagnosis not present

## 2020-04-25 DIAGNOSIS — Z8673 Personal history of transient ischemic attack (TIA), and cerebral infarction without residual deficits: Secondary | ICD-10-CM | POA: Diagnosis not present

## 2020-04-25 DIAGNOSIS — R569 Unspecified convulsions: Secondary | ICD-10-CM | POA: Diagnosis not present

## 2020-04-25 DIAGNOSIS — I5032 Chronic diastolic (congestive) heart failure: Secondary | ICD-10-CM | POA: Diagnosis not present

## 2020-04-25 DIAGNOSIS — E785 Hyperlipidemia, unspecified: Secondary | ICD-10-CM | POA: Diagnosis not present

## 2020-04-29 DIAGNOSIS — R339 Retention of urine, unspecified: Secondary | ICD-10-CM | POA: Diagnosis not present

## 2020-04-30 DIAGNOSIS — I5032 Chronic diastolic (congestive) heart failure: Secondary | ICD-10-CM | POA: Diagnosis not present

## 2020-04-30 DIAGNOSIS — G629 Polyneuropathy, unspecified: Secondary | ICD-10-CM | POA: Diagnosis not present

## 2020-04-30 DIAGNOSIS — R569 Unspecified convulsions: Secondary | ICD-10-CM | POA: Diagnosis not present

## 2020-04-30 DIAGNOSIS — Z7982 Long term (current) use of aspirin: Secondary | ICD-10-CM | POA: Diagnosis not present

## 2020-04-30 DIAGNOSIS — K219 Gastro-esophageal reflux disease without esophagitis: Secondary | ICD-10-CM | POA: Diagnosis not present

## 2020-04-30 DIAGNOSIS — E785 Hyperlipidemia, unspecified: Secondary | ICD-10-CM | POA: Diagnosis not present

## 2020-04-30 DIAGNOSIS — Z8546 Personal history of malignant neoplasm of prostate: Secondary | ICD-10-CM | POA: Diagnosis not present

## 2020-04-30 DIAGNOSIS — I110 Hypertensive heart disease with heart failure: Secondary | ICD-10-CM | POA: Diagnosis not present

## 2020-04-30 DIAGNOSIS — E871 Hypo-osmolality and hyponatremia: Secondary | ICD-10-CM | POA: Diagnosis not present

## 2020-04-30 DIAGNOSIS — Z8673 Personal history of transient ischemic attack (TIA), and cerebral infarction without residual deficits: Secondary | ICD-10-CM | POA: Diagnosis not present

## 2020-05-13 ENCOUNTER — Telehealth: Payer: Self-pay | Admitting: Adult Health

## 2020-05-13 NOTE — Telephone Encounter (Signed)
[facility]) called, originally  faxed over POC to be signed on 10/25. Carlos Wise is not enrolled in [place], would need physician who is enrolled in [place] to sign the POC. Would like a call from the nurse.

## 2020-05-13 NOTE — Telephone Encounter (Signed)
LMVM for Carlos Wise with [facility] to call back.

## 2020-05-14 NOTE — Telephone Encounter (Signed)
Called and spoke to Carlos Wise.  I relayed that she works with medicare pts and has signed all kinds of p/w relating to pts that have MCR. She did get married and name change, but that has been a while now.  She will recheck with billing dept and if needed will call back.

## 2020-05-23 DIAGNOSIS — E871 Hypo-osmolality and hyponatremia: Secondary | ICD-10-CM | POA: Diagnosis not present

## 2020-05-23 DIAGNOSIS — G629 Polyneuropathy, unspecified: Secondary | ICD-10-CM | POA: Diagnosis not present

## 2020-05-23 DIAGNOSIS — R69 Illness, unspecified: Secondary | ICD-10-CM | POA: Diagnosis not present

## 2020-05-23 DIAGNOSIS — I10 Essential (primary) hypertension: Secondary | ICD-10-CM | POA: Diagnosis not present

## 2020-05-23 DIAGNOSIS — R569 Unspecified convulsions: Secondary | ICD-10-CM | POA: Diagnosis not present

## 2020-05-23 DIAGNOSIS — Z9181 History of falling: Secondary | ICD-10-CM | POA: Diagnosis not present

## 2020-05-23 DIAGNOSIS — Z1331 Encounter for screening for depression: Secondary | ICD-10-CM | POA: Diagnosis not present

## 2020-05-23 DIAGNOSIS — K219 Gastro-esophageal reflux disease without esophagitis: Secondary | ICD-10-CM | POA: Diagnosis not present

## 2020-05-23 DIAGNOSIS — Z139 Encounter for screening, unspecified: Secondary | ICD-10-CM | POA: Diagnosis not present

## 2020-05-23 DIAGNOSIS — E7800 Pure hypercholesterolemia, unspecified: Secondary | ICD-10-CM | POA: Diagnosis not present

## 2020-05-24 DIAGNOSIS — R339 Retention of urine, unspecified: Secondary | ICD-10-CM | POA: Diagnosis not present

## 2020-06-18 ENCOUNTER — Telehealth: Payer: Self-pay | Admitting: Adult Health

## 2020-06-18 NOTE — Telephone Encounter (Signed)
Spoke to [employer].  [facility] has been providing services for pt.  Need a [employer] verified provider to sign off (medicare requirement).  Since name change vansch to [place]. Needs to register in [employer] (provider enrollment  certification o? system).  The are not able to release on there system to be able to bill medicare.  Will send new POC for Dr. Pearlean Brownie who is supervising provider to sign.  (he is verified).  We have not received any other issues about this.  There system??

## 2020-06-18 NOTE — Telephone Encounter (Signed)
[facility]) called [employer] giving an alert of physician name mismatch for Dr. Lexine Baton. We cannot bill for services until legal name has been enrolled in [place]. Another physician already enrolled in [employer] can sign. Contact info: [phone]

## 2020-06-19 NOTE — Telephone Encounter (Signed)
Noted  

## 2020-06-19 NOTE — Telephone Encounter (Signed)
I have no idea what what that is nor do I remember doing previously. Agree with sending to Dr. Pearlean Brownie for completion. Thank you.

## 2020-06-24 DIAGNOSIS — E871 Hypo-osmolality and hyponatremia: Secondary | ICD-10-CM | POA: Diagnosis not present

## 2020-06-24 NOTE — Telephone Encounter (Signed)
POC orders signed and faxed back to [facility].  OK transmission received.

## 2020-07-11 DIAGNOSIS — M7541 Impingement syndrome of right shoulder: Secondary | ICD-10-CM | POA: Diagnosis not present

## 2020-07-23 ENCOUNTER — Encounter: Payer: Self-pay | Admitting: Adult Health

## 2020-07-23 ENCOUNTER — Other Ambulatory Visit: Payer: Self-pay

## 2020-07-23 ENCOUNTER — Ambulatory Visit: Payer: Medicare HMO | Admitting: Adult Health

## 2020-07-23 VITALS — BP 129/80 | HR 86 | Ht 68.0 in | Wt 190.6 lb

## 2020-07-23 DIAGNOSIS — R569 Unspecified convulsions: Secondary | ICD-10-CM

## 2020-07-23 DIAGNOSIS — R2990 Unspecified symptoms and signs involving the nervous system: Secondary | ICD-10-CM | POA: Diagnosis not present

## 2020-07-23 NOTE — Progress Notes (Signed)
[facility] [address] [place]. [place] [address] [phone]       STROKE FOLLOW UP NOTE  [name] Date of Birth:  [birth year] Medical Record Number:  [id number]   Reason for Referral:  stroke follow up    SUBJECTIVE:   CHIEF COMPLAINT:  Chief Complaint  Patient presents with  . Follow-up    Patient here for a follow up on his seizures. Last seen on 03/20/20. Patient reports no seizure activity. Patient says that the Keppra XR 500mg  BID is very expensive. He was previously on Keppra 500mg  BID.    HPI:   Today, 07/23/2020, [name] returns for 31-month follow-up unaccompanied.  He has been doing well since discharge and denies any reoccurring seizure activity.  He has remained on Keppra XR 1000mg  nightly and does report some improvement of daytime fatigue.  He is questioning ongoing need and possible dosage decrease if able.  He reports continued low sodium levels with recent lab work in January (unable to view via epic).  He reports trying sodium tablets for approximately 1 week then discontinue as he was not voiding as much (hx of prostate cancer) -he quickly returned back to baseline after discontinuing. he has also remained on aspirin for stroke prevention and denies bleeding or bruising.  Blood pressure today 129/80.  No further concerns at this time.    History provided for reference purposes only Initial visit 03/20/2020 JM: Mr. [name] is being seen for hospital follow-up accompanied by his wife. Per patient and wife, they arrived to SNF but instantly " turned around and drove home".  He has not had any reoccurring seizure activity or events or stroke/TIA symptoms since returning home.  He does continue to experience imbalance and occasional dizziness which he reports is at baseline.  He is not currently using assistive device for ambulation.  He is requesting home health PT.  He remains on Keppra 500 mg twice daily reporting increased daytime fatigue but  otherwise tolerating well.  He denies previously being on aspirin or starting aspirin at discharge as well as not starting atorvastatin 80 mg daily as advised.  Blood pressure today 122/80.  No concerns at this time.  Stroke admission 02/04/2020 [name] is a 80 y.o. male with history of HTN, HLD who woke w/ confusion. Presented on 02/04/2020 with acute aphasia. LKW Saturday night before went to bed. Woke up Sunday 4am with speech difficulty and gait impairment.  Stroke work up negative and symptoms likely seizure with prolonged post ictal state.  EEG and LTM EEG no seizures partial left temporal sharp waves and left hemisphere slowing.  Loaded with keppra and discharged on keppra 500mg  BID.  Found to have hyponatremia with possible etiology for seizures with admission Na 127.  On aspirin 81mg  PTA and recommend increasing dose to 325mg  daily. HTN stable.  LDL 93 previously on simvastatin and switched to atorvastatin 80 mg daily.  Other stroke risk factors include advanced age, former tobacco use and EtOH use.  Other active problems include hx of prostate CA s/p XRT, hx of herpes zoster, hx R ICA aneurysm s/p coiling, prolonged QTC, GERD and peripheral neuropathy.  Evaluated by therapy and recommended discharge to SNF for ongoing therapy needs   Likely seizure with prolonged post ictal - resolved  Code Stroke CT head No acute abnormality. Small vessel disease. Atrophy. Ventriculomegaly progressed since 2007. ASPECTS 10.     CTA head & neck atherosclerosis throughout. Hx stent assisted R  ICA aneurysm coiling.  CT perfusion no core or penumbra  MRI  No acute abnormality. Small vessel disease. Atrophy.   2D Echo EF 60-65%. No source of embolus   EEG and LTM EEG - no sz, L temporal sharp waves and left hemisphere slowing  Keppra load 2gm followed by 500 bid - continue on discharge  LDL 193  HgbA1c 5.6  VTE prophylaxis - Lovenox 40 mg sq daily   aspirin 81 mg daily prior to admission, now  on aspirin 325mg  daily.   Therapy recommendations:  SNF  Disposition:   [facility]      ROS:   14 system review of systems performed and negative with exception of those listed in HPI  PMH:  Past Medical History:  Diagnosis Date  . Herpes zoster   . Hyperlipemia   . Hypertension   . Intestinal polyp    hyperplastic  . Prostate cancer ([place])   . S/P radiation therapy 12/12/2014 through 02/06/2015    Prostate 7800 cGy in 40 sessions, seminal vesicles 5600 cGy in 40 sessions     PSH:  Past Surgical History:  Procedure Laterality Date  . Carotid Artery Catherization    . PROSTATE BIOPSY  06/27/2014    Social History:  Social History   Socioeconomic History  . Marital status: Married    Spouse name: Not on file  . Number of children: 0  . Years of education: Not on file  . Highest education level: Not on file  Occupational History  . Occupation: Retired  Tobacco Use  . Smoking status: Former Research scientist (life sciences)  . Smokeless tobacco: Never Used  . Tobacco comment: Quit 30 years ago  Substance and Sexual Activity  . Alcohol use: Yes    Alcohol/week: 0.0 standard drinks    Comment: 3 oz daily  . Drug use: No  . Sexual activity: Not on file  Other Topics Concern  . Not on file  Social History Narrative  . Not on file   Social Determinants of Health   Financial Resource Strain: Not on file  Food Insecurity: Not on file  Transportation Needs: Not on file  Physical Activity: Not on file  Stress: Not on file  Social Connections: Not on file  Intimate Partner Violence: Not on file    Family History:  Family History  Problem Relation Age of Onset  . Colon cancer Mother   . Prostate cancer Father   . Heart attack Father     Medications:   Current Outpatient Medications on File Prior to Visit  Medication Sig Dispense Refill  . amLODipine (NORVASC) 2.5 MG tablet Take 2.5 mg by mouth daily.     Marland Kitchen aspirin EC 81 MG tablet Take 1 tablet (81 mg total) by mouth daily. Swallow whole. 30 tablet 11  . Cyanocobalamin (VITAMIN B-12 ER PO) Take 1 tablet by mouth daily.     Marland Kitchen escitalopram (LEXAPRO) 20 MG tablet Take 20 mg by mouth daily.    . fluticasone (FLONASE) 50 MCG/ACT nasal spray Place 2 sprays into both nostrils daily as needed for allergies or rhinitis.     Marland Kitchen gabapentin (NEURONTIN) 300 MG capsule Take 300 mg by mouth See admin instructions. Take 300 mg by mouth one to two times a day    . levETIRAcetam (KEPPRA XR) 500 MG 24 hr tablet Take 2 tablets (1,000 mg total) by mouth at bedtime. 60 tablet 6  . Multiple Vitamins-Minerals (CENTRUM ADULTS PO) Take 1 tablet by mouth daily.     Marland Kitchen  pantoprazole (PROTONIX) 40 MG tablet Take 40 mg by mouth daily before breakfast.     . polyethylene glycol (MIRALAX / GLYCOLAX) 17 g packet Take 17 g by mouth daily as needed for severe constipation. 14 each 0   No current facility-administered medications on file prior to visit.    Allergies:   Allergies  Allergen Reactions  . Penicillins Other (See Comments)    Patient states no allergy, but has preference that he does not want this medication      OBJECTIVE:  Physical Exam  Vitals:   07/23/20 1421  BP: 129/80  Pulse: 86  Weight: 190 lb 9.6 oz (86.5 kg)  Height: 5\' 8"  (1.727 m)   Body mass index is 28.98 kg/m. No exam data present  General: well developed, well nourished, very pleasant elderly Caucasian male, seated, in no evident distress Head: head normocephalic and atraumatic.   Neck: supple with no carotid or supraclavicular bruits Cardiovascular: regular rate and rhythm, no murmurs Musculoskeletal: no deformity Skin:  no rash/petichiae Vascular:  Normal pulses all extremities   Neurologic Exam Mental Status: Awake and fully alert.   Fluent speech and language.  Oriented to place and time. Recent and remote memory intact. Attention span, concentration and fund of knowledge  appropriate. Mood and affect appropriate.  Cranial Nerves: Pupils equal, briskly reactive to light. Extraocular movements full without nystagmus. Visual fields full to confrontation.  HOH bilaterally (hearing aids not present today).  Facial sensation intact. Face, tongue, palate moves normally and symmetrically.  Motor: Normal bulk and tone. Normal strength in all tested extremity muscles. Sensory.: intact to touch , pinprick , position and vibratory sensation.  Coordination: Rapid alternating movements normal in all extremities. Finger-to-nose and heel-to-shin performed accurately bilaterally. Gait and Station: Arises from chair without difficulty. Stance is normal. Gait demonstrates normal stride length with mild unsteadiness especially with making turns without use of assistive device. Reflexes: 1+ and symmetric. Toes downgoing.       ASSESSMENT/PLAN: Carlos Wise is a 80 y.o. year old male presented with acute aphasia and confusion on 02/04/2020 with stroke work-up unremarkable and likely seizure with prolonged postictal state. Vascular risk factors include HTN, HLD, hyponatremia during admission, advanced age, former tobacco use, EtOH use and chronic diastolic CHF.      1. Seizure:  a. No reoccurring seizure activity or event.   b. Possibly secondary to hyponatremia -he reports continued hyponatremia monitored by PCP recently obtained lab work (unable to personally view via epic) c. Continue Keppra XR 1000mg  nightly for seizure prophylaxis d. Repeat EEG and if normal, may consider slowly tapering dosage of Keppra  2. Strokelike symptoms: Continue aspirin 81 mg daily for stroke prevention.  Declines interest in statin therapy.  Discussed stroke prevention measures and importance of close PCP follow up for aggressive stroke risk factor management including HTN with BP goal<130/90 and HLD with LDL goal<70    Follow up in 4 months or call earlier if needed  CC:  [facility] provider: Dr.  Gillian Scarce, Lorin Mercy, MD     I spent 30 minutes of face-to-face and non-face-to-face time with patient.  This included previsit chart review, lab review, study review, order entry, electronic health record documentation, patient education regarding stroke like episode likely seizure with postictal state, possible seizure etiology and ongoing use of AED as well as repeating EEG, importance of managing stroke risk factors and answered all other questions to patient satisfaction   Frann Rider, AGNP-BC  [facility] [phone]  [address] [place], [place] [address]  Phone [phone] Fax [phone] Note: This document was prepared with digital dictation and possible smart phrase technology. Any transcriptional errors that result from this process are unintentional.

## 2020-07-23 NOTE — Progress Notes (Signed)
I agree with the above plan 

## 2020-07-23 NOTE — Patient Instructions (Signed)
Your Plan:  We will repeat EEG which is a test that monitors your brain activity to assess for seizures  If this is normal, we may consider decreasing dosage of Keppra if you are interested but at this time, please continue Keppra XR 1000mg  nightly (2 tabs at night)  Please ensure follow-up with your PCP to ensure repeat and routine monitoring of sodium levels as well as use of salt tablets    Follow-up in 4 months or call earlier if needed      Thank you for coming to see [place] at [facility]. I hope we have been able to provide you high quality care today.  You may receive a patient satisfaction survey over the next few weeks. We would appreciate your feedback and comments so that we may continue to improve ourselves and the health of our patients.     Electroencephalogram, Adult An electroencephalogram (EEG) is a test that records electrical activity in the brain. It is often used to diagnose or monitor problems that are related to the brain, such as:  Seizure disorders.  Fainting spells.  Sleep problems.  Head injuries.  Changes in behavior. Tell a health care provider about:  Any allergies you have.  All medicines you are taking, including vitamins, herbs, eye drops, creams, and over-the-counter medicines.  Any medical conditions you have or have had, including psychiatric conditions.  Any surgeries you have had.  Any history of heavy drug or alcohol use. What are the risks? Generally, this is a safe test. If you have a seizure disorder, you may be made to have a seizure during the test. This is done so that your brain activity can be recorded during the seizure. What happens before the procedure?  Arrive with your hair clean, dry, and not styled. ? Do not comb your hair toward your scalp to add volume (backcomb). ? Do not put hair spray, oil, or other hair products in your hair.  Do not have caffeine within 4 hours of having your  test.  Follow instructions from your health care provider about how much sleep to get before the test. If you are asked to limit how much you sleep, you may need to have a responsible adult bring you to the test and take you home from the test.  Ask your health care provider about taking your regular and over-the-counter medicines, herbs, and supplements.   What happens during the procedure? You will be asked to sit in a chair or lie down. Many small metal discs (electrodes) will be carefully attached to your head with an adhesive. It may take some time to get all the electrodes on the right spots for the test. These electrodes will pick up on the signals in your brain, and a machine will record the signals. During the test, you may be asked to:  Sit or lie quietly and relax.  Open and close your eyes.  Breathe deeply and quickly (hyperventilate) for 3 minutes or longer.  Look at a flashing light for a short amount of time.  Read text or look at an image.  Go to sleep. When the test is complete, the electrodes will be removed by using a solution such as acetone or fingernail polish remover.   What can I expect after the test? After your test, it is common to have no after-effects from the test. You should be able to return to your normal daily activities right away. Keep in mind:  You may need  to bathe after the test to remove the adhesive used to attach the electrodes. The adhesive is easily washed away.  If a seizure happened during your EEG, you may need further medical help depending on the type of seizure and how severe it was. It is up to you to get the results of your procedure. Ask your health care provider, or the department that is doing the procedure, when your results will be ready. Summary  An electroencephalogram (EEG) is a test that is often used to diagnose or monitor problems that are related to the brain.  Do not have caffeine within 4 hours of having your test.  Follow other instructions from your health care provider about sleep and medicines before the test.  During the procedure, small metal discs (electrodes) will be attached to your head with an adhesive.  You may be asked to sit or lie quietly and relax during the test. You may also be asked to do other activities during the test, such as watch flashing lights or breathe quickly. This information is not intended to replace advice given to you by your health care provider. Make sure you discuss any questions you have with your health care provider. Document Revised: 11/27/2019 Document Reviewed: 11/16/2019 Elsevier Patient Education  [place].

## 2020-07-26 ENCOUNTER — Telehealth: Payer: Self-pay | Admitting: Neurology

## 2020-07-26 ENCOUNTER — Other Ambulatory Visit: Payer: Self-pay | Admitting: Adult Health

## 2020-07-26 MED ORDER — LEVETIRACETAM 500 MG PO TABS: 500.0000 mg | ORAL_TABLET | Freq: Two times a day (BID) | ORAL | 11 refills | Status: DC

## 2020-07-26 NOTE — Telephone Encounter (Signed)
Patient called on-call, could not afford Keppra XR, change to regular Keppra 500 mg twice daily, new prescription was sent.  Meds ordered this encounter  Medications  . levETIRAcetam (KEPPRA) 500 MG tablet    Sig: Take 1 tablet (500 mg total) by mouth 2 (two) times daily.    Dispense:  60 tablet    Refill:  11

## 2020-07-29 NOTE — Telephone Encounter (Signed)
Noted! Thank you

## 2020-08-01 DIAGNOSIS — M7541 Impingement syndrome of right shoulder: Secondary | ICD-10-CM | POA: Diagnosis not present

## 2020-08-07 DIAGNOSIS — R339 Retention of urine, unspecified: Secondary | ICD-10-CM | POA: Diagnosis not present

## 2020-08-12 ENCOUNTER — Ambulatory Visit: Payer: Medicare HMO | Admitting: Neurology

## 2020-08-12 ENCOUNTER — Other Ambulatory Visit: Payer: Self-pay

## 2020-08-12 DIAGNOSIS — R569 Unspecified convulsions: Secondary | ICD-10-CM

## 2020-08-12 DIAGNOSIS — R2990 Unspecified symptoms and signs involving the nervous system: Secondary | ICD-10-CM

## 2020-08-13 DIAGNOSIS — M25511 Pain in right shoulder: Secondary | ICD-10-CM | POA: Diagnosis not present

## 2020-08-13 DIAGNOSIS — M7541 Impingement syndrome of right shoulder: Secondary | ICD-10-CM | POA: Diagnosis not present

## 2020-08-28 ENCOUNTER — Telehealth: Payer: Self-pay | Admitting: Adult Health

## 2020-08-28 NOTE — Telephone Encounter (Signed)
Pt is asking for a call with results to EEG

## 2020-08-28 NOTE — Telephone Encounter (Signed)
EEG done 08-12-20. calling for results.

## 2020-08-29 NOTE — Procedures (Signed)
   HISTORY: 80 year old male, presented with acute aphasia February 04, 2020, MRI showed no acute abnormality, EEG revealed left hemisphere slowing, intermittent left parietal temporal region sharp waves.  He was loaded with Keppra, remained on 1000 mg daily, he has no recurrent seizure activity TECHNIQUE:  This is a routine 16 channel EEG recording with one channel devoted to a limited EKG recording.  It was performed during wakefulness, drowsiness and asleep.  Hyperventilation and photic stimulation were performed as activating procedures.  There are minimum muscle and movement artifact noted.  Upon maximum arousal, posterior dominant waking rhythm consistent of rhythmic alpha range activity, with frequency of 8 hz. Activities are symmetric over the bilateral posterior derivations and attenuated with eye opening.  Hyperventilation produced mild/moderate buildup with higher amplitude and the slower activities noted.  Photic stimulation did not alter the tracing.  During EEG recording, patient developed drowsiness and no deeper stage of sleep was achieved  During EEG recording, there was no epileptiform discharge noted.  EKG demonstrate sinus rhythm, with heart rate of 76 bpm  CONCLUSION: This is a  normal awake EEG.  There is no electrodiagnostic evidence of epileptiform discharge.  Marcial Pacas, M.D. Ph.D.  [facility] [place], [place] [address] Phone: [phone] Fax:      [phone]

## 2020-08-29 NOTE — Telephone Encounter (Signed)
I have called patient, failed to reach him, left message to call back for report and questions.  please try again, EEG showed no significant abnormality.

## 2020-08-29 NOTE — Telephone Encounter (Signed)
Called patient, informed him EEG showed no significant abnormality. Patient verbalized understanding, appreciation.'

## 2020-08-31 DIAGNOSIS — R0781 Pleurodynia: Secondary | ICD-10-CM | POA: Diagnosis not present

## 2020-08-31 DIAGNOSIS — W01198A Fall on same level from slipping, tripping and stumbling with subsequent striking against other object, initial encounter: Secondary | ICD-10-CM | POA: Diagnosis not present

## 2020-08-31 DIAGNOSIS — S01111A Laceration without foreign body of right eyelid and periocular area, initial encounter: Secondary | ICD-10-CM | POA: Diagnosis not present

## 2020-08-31 DIAGNOSIS — S199XXA Unspecified injury of neck, initial encounter: Secondary | ICD-10-CM | POA: Diagnosis not present

## 2020-08-31 DIAGNOSIS — S0101XA Laceration without foreign body of scalp, initial encounter: Secondary | ICD-10-CM | POA: Diagnosis not present

## 2020-08-31 DIAGNOSIS — S0181XA Laceration without foreign body of other part of head, initial encounter: Secondary | ICD-10-CM | POA: Diagnosis not present

## 2020-08-31 DIAGNOSIS — J3489 Other specified disorders of nose and nasal sinuses: Secondary | ICD-10-CM | POA: Diagnosis not present

## 2020-08-31 DIAGNOSIS — Z7982 Long term (current) use of aspirin: Secondary | ICD-10-CM | POA: Diagnosis not present

## 2020-08-31 DIAGNOSIS — S40811A Abrasion of right upper arm, initial encounter: Secondary | ICD-10-CM | POA: Diagnosis not present

## 2020-08-31 DIAGNOSIS — Z79899 Other long term (current) drug therapy: Secondary | ICD-10-CM | POA: Diagnosis not present

## 2020-08-31 DIAGNOSIS — S01311A Laceration without foreign body of right ear, initial encounter: Secondary | ICD-10-CM | POA: Diagnosis not present

## 2020-08-31 DIAGNOSIS — Z791 Long term (current) use of non-steroidal anti-inflammatories (NSAID): Secondary | ICD-10-CM | POA: Diagnosis not present

## 2020-08-31 DIAGNOSIS — R0789 Other chest pain: Secondary | ICD-10-CM | POA: Diagnosis not present

## 2020-08-31 DIAGNOSIS — S50811A Abrasion of right forearm, initial encounter: Secondary | ICD-10-CM | POA: Diagnosis not present

## 2020-08-31 DIAGNOSIS — Z87891 Personal history of nicotine dependence: Secondary | ICD-10-CM | POA: Diagnosis not present

## 2020-09-09 DIAGNOSIS — Z6829 Body mass index (BMI) 29.0-29.9, adult: Secondary | ICD-10-CM | POA: Diagnosis not present

## 2020-09-09 DIAGNOSIS — S0181XD Laceration without foreign body of other part of head, subsequent encounter: Secondary | ICD-10-CM | POA: Diagnosis not present

## 2020-09-12 DIAGNOSIS — M7541 Impingement syndrome of right shoulder: Secondary | ICD-10-CM | POA: Diagnosis not present

## 2020-09-17 DIAGNOSIS — M7541 Impingement syndrome of right shoulder: Secondary | ICD-10-CM | POA: Diagnosis not present

## 2020-09-26 DIAGNOSIS — R339 Retention of urine, unspecified: Secondary | ICD-10-CM | POA: Diagnosis not present

## 2020-10-01 DIAGNOSIS — M25511 Pain in right shoulder: Secondary | ICD-10-CM | POA: Diagnosis not present

## 2020-10-01 DIAGNOSIS — M7541 Impingement syndrome of right shoulder: Secondary | ICD-10-CM | POA: Diagnosis not present

## 2020-10-08 DIAGNOSIS — M25511 Pain in right shoulder: Secondary | ICD-10-CM | POA: Diagnosis not present

## 2020-10-08 DIAGNOSIS — M7541 Impingement syndrome of right shoulder: Secondary | ICD-10-CM | POA: Diagnosis not present

## 2020-10-10 DIAGNOSIS — M25511 Pain in right shoulder: Secondary | ICD-10-CM | POA: Diagnosis not present

## 2020-10-10 DIAGNOSIS — M7541 Impingement syndrome of right shoulder: Secondary | ICD-10-CM | POA: Diagnosis not present

## 2020-10-17 DIAGNOSIS — M7541 Impingement syndrome of right shoulder: Secondary | ICD-10-CM | POA: Diagnosis not present

## 2020-10-17 DIAGNOSIS — M25511 Pain in right shoulder: Secondary | ICD-10-CM | POA: Diagnosis not present

## 2020-11-26 ENCOUNTER — Ambulatory Visit: Payer: Medicare HMO | Admitting: Adult Health

## 2020-12-06 DIAGNOSIS — R339 Retention of urine, unspecified: Secondary | ICD-10-CM | POA: Diagnosis not present

## 2021-01-01 ENCOUNTER — Encounter: Payer: Self-pay | Admitting: Adult Health

## 2021-01-01 ENCOUNTER — Ambulatory Visit: Payer: Medicare HMO | Admitting: Adult Health

## 2021-02-20 DIAGNOSIS — H90A22 Sensorineural hearing loss, unilateral, left ear, with restricted hearing on the contralateral side: Secondary | ICD-10-CM | POA: Diagnosis not present

## 2021-02-20 DIAGNOSIS — H6123 Impacted cerumen, bilateral: Secondary | ICD-10-CM | POA: Diagnosis not present

## 2021-06-23 DIAGNOSIS — G629 Polyneuropathy, unspecified: Secondary | ICD-10-CM | POA: Diagnosis not present

## 2021-06-23 DIAGNOSIS — Z9181 History of falling: Secondary | ICD-10-CM | POA: Diagnosis not present

## 2021-06-23 DIAGNOSIS — K219 Gastro-esophageal reflux disease without esophagitis: Secondary | ICD-10-CM | POA: Diagnosis not present

## 2021-06-23 DIAGNOSIS — F410 Panic disorder [episodic paroxysmal anxiety] without agoraphobia: Secondary | ICD-10-CM | POA: Diagnosis not present

## 2021-06-23 DIAGNOSIS — G40909 Epilepsy, unspecified, not intractable, without status epilepticus: Secondary | ICD-10-CM | POA: Diagnosis not present

## 2021-06-23 DIAGNOSIS — Z1331 Encounter for screening for depression: Secondary | ICD-10-CM | POA: Diagnosis not present

## 2021-06-23 DIAGNOSIS — E871 Hypo-osmolality and hyponatremia: Secondary | ICD-10-CM | POA: Diagnosis not present

## 2021-06-23 DIAGNOSIS — M25511 Pain in right shoulder: Secondary | ICD-10-CM | POA: Diagnosis not present

## 2021-06-23 DIAGNOSIS — F411 Generalized anxiety disorder: Secondary | ICD-10-CM | POA: Diagnosis not present

## 2021-06-23 DIAGNOSIS — R69 Illness, unspecified: Secondary | ICD-10-CM | POA: Diagnosis not present

## 2021-06-23 DIAGNOSIS — Z139 Encounter for screening, unspecified: Secondary | ICD-10-CM | POA: Diagnosis not present

## 2021-06-24 DIAGNOSIS — M19011 Primary osteoarthritis, right shoulder: Secondary | ICD-10-CM | POA: Diagnosis not present

## 2021-06-24 DIAGNOSIS — M25511 Pain in right shoulder: Secondary | ICD-10-CM | POA: Diagnosis not present

## 2021-07-21 ENCOUNTER — Other Ambulatory Visit: Payer: Self-pay | Admitting: Neurology

## 2021-08-15 ENCOUNTER — Other Ambulatory Visit: Payer: Self-pay | Admitting: Neurology

## 2021-08-21 DIAGNOSIS — H903 Sensorineural hearing loss, bilateral: Secondary | ICD-10-CM | POA: Diagnosis not present

## 2021-08-21 DIAGNOSIS — R42 Dizziness and giddiness: Secondary | ICD-10-CM | POA: Diagnosis not present

## 2021-08-21 DIAGNOSIS — H6123 Impacted cerumen, bilateral: Secondary | ICD-10-CM | POA: Diagnosis not present

## 2021-09-09 DIAGNOSIS — H2513 Age-related nuclear cataract, bilateral: Secondary | ICD-10-CM | POA: Diagnosis not present

## 2021-09-16 DIAGNOSIS — E46 Unspecified protein-calorie malnutrition: Secondary | ICD-10-CM | POA: Diagnosis not present

## 2021-09-16 DIAGNOSIS — Z6826 Body mass index (BMI) 26.0-26.9, adult: Secondary | ICD-10-CM | POA: Diagnosis not present

## 2021-09-16 DIAGNOSIS — R1310 Dysphagia, unspecified: Secondary | ICD-10-CM | POA: Diagnosis not present

## 2021-09-16 DIAGNOSIS — L299 Pruritus, unspecified: Secondary | ICD-10-CM | POA: Diagnosis not present

## 2021-09-16 DIAGNOSIS — R197 Diarrhea, unspecified: Secondary | ICD-10-CM | POA: Diagnosis not present

## 2021-09-16 DIAGNOSIS — M25511 Pain in right shoulder: Secondary | ICD-10-CM | POA: Diagnosis not present

## 2021-09-17 DIAGNOSIS — Z9181 History of falling: Secondary | ICD-10-CM | POA: Diagnosis not present

## 2021-09-17 DIAGNOSIS — R262 Difficulty in walking, not elsewhere classified: Secondary | ICD-10-CM | POA: Diagnosis not present

## 2021-09-17 DIAGNOSIS — R278 Other lack of coordination: Secondary | ICD-10-CM | POA: Diagnosis not present

## 2021-09-26 DIAGNOSIS — R197 Diarrhea, unspecified: Secondary | ICD-10-CM | POA: Diagnosis not present

## 2021-09-26 DIAGNOSIS — M25511 Pain in right shoulder: Secondary | ICD-10-CM | POA: Diagnosis not present

## 2021-09-30 DIAGNOSIS — Z9181 History of falling: Secondary | ICD-10-CM | POA: Diagnosis not present

## 2021-09-30 DIAGNOSIS — R262 Difficulty in walking, not elsewhere classified: Secondary | ICD-10-CM | POA: Diagnosis not present

## 2021-09-30 DIAGNOSIS — R278 Other lack of coordination: Secondary | ICD-10-CM | POA: Diagnosis not present

## 2021-10-06 DIAGNOSIS — C61 Malignant neoplasm of prostate: Secondary | ICD-10-CM | POA: Diagnosis not present

## 2021-10-06 DIAGNOSIS — R350 Frequency of micturition: Secondary | ICD-10-CM | POA: Diagnosis not present

## 2021-10-06 DIAGNOSIS — R3915 Urgency of urination: Secondary | ICD-10-CM | POA: Diagnosis not present

## 2021-10-13 DIAGNOSIS — L209 Atopic dermatitis, unspecified: Secondary | ICD-10-CM | POA: Diagnosis not present

## 2021-10-13 DIAGNOSIS — L820 Inflamed seborrheic keratosis: Secondary | ICD-10-CM | POA: Diagnosis not present

## 2021-10-13 DIAGNOSIS — L299 Pruritus, unspecified: Secondary | ICD-10-CM | POA: Diagnosis not present

## 2021-10-23 DIAGNOSIS — R262 Difficulty in walking, not elsewhere classified: Secondary | ICD-10-CM | POA: Diagnosis not present

## 2021-10-23 DIAGNOSIS — Z9181 History of falling: Secondary | ICD-10-CM | POA: Diagnosis not present

## 2021-10-23 DIAGNOSIS — R278 Other lack of coordination: Secondary | ICD-10-CM | POA: Diagnosis not present

## 2021-10-24 ENCOUNTER — Encounter: Payer: Self-pay | Admitting: Family Medicine

## 2021-10-31 DIAGNOSIS — Z9181 History of falling: Secondary | ICD-10-CM | POA: Diagnosis not present

## 2021-10-31 DIAGNOSIS — R262 Difficulty in walking, not elsewhere classified: Secondary | ICD-10-CM | POA: Diagnosis not present

## 2021-10-31 DIAGNOSIS — R278 Other lack of coordination: Secondary | ICD-10-CM | POA: Diagnosis not present

## 2021-11-07 DIAGNOSIS — R278 Other lack of coordination: Secondary | ICD-10-CM | POA: Diagnosis not present

## 2021-11-07 DIAGNOSIS — R42 Dizziness and giddiness: Secondary | ICD-10-CM | POA: Diagnosis not present

## 2021-11-07 DIAGNOSIS — R262 Difficulty in walking, not elsewhere classified: Secondary | ICD-10-CM | POA: Diagnosis not present

## 2021-11-07 DIAGNOSIS — Z9181 History of falling: Secondary | ICD-10-CM | POA: Diagnosis not present

## 2021-11-08 DIAGNOSIS — N390 Urinary tract infection, site not specified: Secondary | ICD-10-CM | POA: Diagnosis not present

## 2021-11-08 DIAGNOSIS — R42 Dizziness and giddiness: Secondary | ICD-10-CM | POA: Diagnosis not present

## 2021-11-12 DIAGNOSIS — R262 Difficulty in walking, not elsewhere classified: Secondary | ICD-10-CM | POA: Diagnosis not present

## 2021-11-12 DIAGNOSIS — R278 Other lack of coordination: Secondary | ICD-10-CM | POA: Diagnosis not present

## 2021-11-12 DIAGNOSIS — Z9181 History of falling: Secondary | ICD-10-CM | POA: Diagnosis not present

## 2021-11-19 DIAGNOSIS — R262 Difficulty in walking, not elsewhere classified: Secondary | ICD-10-CM | POA: Diagnosis not present

## 2021-11-19 DIAGNOSIS — R278 Other lack of coordination: Secondary | ICD-10-CM | POA: Diagnosis not present

## 2021-11-19 DIAGNOSIS — Z9181 History of falling: Secondary | ICD-10-CM | POA: Diagnosis not present

## 2021-11-22 ENCOUNTER — Emergency Department (HOSPITAL_COMMUNITY): Payer: Medicare HMO

## 2021-11-22 ENCOUNTER — Inpatient Hospital Stay (HOSPITAL_COMMUNITY): Payer: Medicare HMO

## 2021-11-22 ENCOUNTER — Encounter (HOSPITAL_COMMUNITY): Payer: Self-pay

## 2021-11-22 ENCOUNTER — Other Ambulatory Visit: Payer: Self-pay

## 2021-11-22 ENCOUNTER — Inpatient Hospital Stay (HOSPITAL_COMMUNITY)
Admission: EM | Admit: 2021-11-22 | Discharge: 2021-11-28 | DRG: 101 | Disposition: A | Discharge status: Home / Self Care | Payer: Medicare HMO | Attending: Family Medicine | Admitting: Family Medicine

## 2021-11-22 DIAGNOSIS — K219 Gastro-esophageal reflux disease without esophagitis: Secondary | ICD-10-CM | POA: Diagnosis not present

## 2021-11-22 DIAGNOSIS — Z8042 Family history of malignant neoplasm of prostate: Secondary | ICD-10-CM

## 2021-11-22 DIAGNOSIS — Z880 Allergy status to penicillin: Secondary | ICD-10-CM | POA: Diagnosis not present

## 2021-11-22 DIAGNOSIS — E860 Dehydration: Secondary | ICD-10-CM | POA: Diagnosis present

## 2021-11-22 DIAGNOSIS — Z800 Family history of malignant neoplasm of digestive organs: Secondary | ICD-10-CM

## 2021-11-22 DIAGNOSIS — E669 Obesity, unspecified: Secondary | ICD-10-CM | POA: Diagnosis present

## 2021-11-22 DIAGNOSIS — Z8249 Family history of ischemic heart disease and other diseases of the circulatory system: Secondary | ICD-10-CM

## 2021-11-22 DIAGNOSIS — Z8546 Personal history of malignant neoplasm of prostate: Secondary | ICD-10-CM

## 2021-11-22 DIAGNOSIS — Z79899 Other long term (current) drug therapy: Secondary | ICD-10-CM | POA: Diagnosis not present

## 2021-11-22 DIAGNOSIS — I10 Essential (primary) hypertension: Secondary | ICD-10-CM | POA: Diagnosis not present

## 2021-11-22 DIAGNOSIS — E871 Hypo-osmolality and hyponatremia: Secondary | ICD-10-CM | POA: Diagnosis present

## 2021-11-22 DIAGNOSIS — R471 Dysarthria and anarthria: Secondary | ICD-10-CM | POA: Diagnosis not present

## 2021-11-22 DIAGNOSIS — R9431 Abnormal electrocardiogram [ECG] [EKG]: Secondary | ICD-10-CM | POA: Diagnosis present

## 2021-11-22 DIAGNOSIS — Z6828 Body mass index (BMI) 28.0-28.9, adult: Secondary | ICD-10-CM

## 2021-11-22 DIAGNOSIS — I110 Hypertensive heart disease with heart failure: Secondary | ICD-10-CM | POA: Diagnosis not present

## 2021-11-22 DIAGNOSIS — F10239 Alcohol dependence with withdrawal, unspecified: Secondary | ICD-10-CM | POA: Diagnosis present

## 2021-11-22 DIAGNOSIS — Z923 Personal history of irradiation: Secondary | ICD-10-CM

## 2021-11-22 DIAGNOSIS — F32A Depression, unspecified: Secondary | ICD-10-CM | POA: Diagnosis present

## 2021-11-22 DIAGNOSIS — I700 Atherosclerosis of aorta: Secondary | ICD-10-CM | POA: Diagnosis present

## 2021-11-22 DIAGNOSIS — Z789 Other specified health status: Secondary | ICD-10-CM | POA: Diagnosis present

## 2021-11-22 DIAGNOSIS — G629 Polyneuropathy, unspecified: Secondary | ICD-10-CM | POA: Diagnosis present

## 2021-11-22 DIAGNOSIS — Z87891 Personal history of nicotine dependence: Secondary | ICD-10-CM

## 2021-11-22 DIAGNOSIS — N35912 Unspecified bulbous urethral stricture, male: Secondary | ICD-10-CM | POA: Diagnosis not present

## 2021-11-22 DIAGNOSIS — E785 Hyperlipidemia, unspecified: Secondary | ICD-10-CM | POA: Diagnosis present

## 2021-11-22 DIAGNOSIS — K5730 Diverticulosis of large intestine without perforation or abscess without bleeding: Secondary | ICD-10-CM | POA: Diagnosis not present

## 2021-11-22 DIAGNOSIS — R69 Illness, unspecified: Secondary | ICD-10-CM | POA: Diagnosis not present

## 2021-11-22 DIAGNOSIS — G40909 Epilepsy, unspecified, not intractable, without status epilepticus: Secondary | ICD-10-CM | POA: Diagnosis not present

## 2021-11-22 DIAGNOSIS — F1020 Alcohol dependence, uncomplicated: Secondary | ICD-10-CM

## 2021-11-22 DIAGNOSIS — Z743 Need for continuous supervision: Secondary | ICD-10-CM | POA: Diagnosis not present

## 2021-11-22 DIAGNOSIS — Z91138 Patient's unintentional underdosing of medication regimen for other reason: Secondary | ICD-10-CM

## 2021-11-22 DIAGNOSIS — I5032 Chronic diastolic (congestive) heart failure: Secondary | ICD-10-CM | POA: Diagnosis present

## 2021-11-22 DIAGNOSIS — G9341 Metabolic encephalopathy: Secondary | ICD-10-CM | POA: Diagnosis present

## 2021-11-22 DIAGNOSIS — D72829 Elevated white blood cell count, unspecified: Secondary | ICD-10-CM | POA: Diagnosis present

## 2021-11-22 DIAGNOSIS — I671 Cerebral aneurysm, nonruptured: Secondary | ICD-10-CM | POA: Diagnosis present

## 2021-11-22 DIAGNOSIS — R339 Retention of urine, unspecified: Secondary | ICD-10-CM | POA: Diagnosis not present

## 2021-11-22 DIAGNOSIS — R1111 Vomiting without nausea: Secondary | ICD-10-CM | POA: Diagnosis not present

## 2021-11-22 DIAGNOSIS — R112 Nausea with vomiting, unspecified: Secondary | ICD-10-CM | POA: Diagnosis present

## 2021-11-22 DIAGNOSIS — R410 Disorientation, unspecified: Secondary | ICD-10-CM | POA: Diagnosis not present

## 2021-11-22 DIAGNOSIS — R4182 Altered mental status, unspecified: Secondary | ICD-10-CM | POA: Diagnosis not present

## 2021-11-22 DIAGNOSIS — N289 Disorder of kidney and ureter, unspecified: Secondary | ICD-10-CM | POA: Diagnosis not present

## 2021-11-22 DIAGNOSIS — T503X6A Underdosing of electrolytic, caloric and water-balance agents, initial encounter: Secondary | ICD-10-CM | POA: Diagnosis present

## 2021-11-22 DIAGNOSIS — I5030 Unspecified diastolic (congestive) heart failure: Secondary | ICD-10-CM

## 2021-11-22 LAB — CBC WITH DIFFERENTIAL/PLATELET
Abs Immature Granulocytes: 0.09 10*3/uL — ABNORMAL HIGH (ref 0.00–0.07)
Basophils Absolute: 0 10*3/uL (ref 0.0–0.1)
Basophils Relative: 0 %
Eosinophils Absolute: 0 10*3/uL (ref 0.0–0.5)
Eosinophils Relative: 0 %
HCT: 36.4 % — ABNORMAL LOW (ref 39.0–52.0)
Hemoglobin: 13.2 g/dL (ref 13.0–17.0)
Immature Granulocytes: 1 %
Lymphocytes Relative: 6 %
Lymphs Abs: 0.8 10*3/uL (ref 0.7–4.0)
MCH: 32.7 pg (ref 26.0–34.0)
MCHC: 36.3 g/dL — ABNORMAL HIGH (ref 30.0–36.0)
MCV: 90.1 fL (ref 80.0–100.0)
Monocytes Absolute: 0.6 10*3/uL (ref 0.1–1.0)
Monocytes Relative: 5 %
Neutro Abs: 11.2 10*3/uL — ABNORMAL HIGH (ref 1.7–7.7)
Neutrophils Relative %: 88 %
Platelets: 262 10*3/uL (ref 150–400)
RBC: 4.04 MIL/uL — ABNORMAL LOW (ref 4.22–5.81)
RDW: 12.1 % (ref 11.5–15.5)
WBC: 12.7 10*3/uL — ABNORMAL HIGH (ref 4.0–10.5)
nRBC: 0 % (ref 0.0–0.2)

## 2021-11-22 LAB — I-STAT VENOUS BLOOD GAS, ED
Acid-Base Excess: 1 mmol/L (ref 0.0–2.0)
Bicarbonate: 22.4 mmol/L (ref 20.0–28.0)
Calcium, Ion: 1.02 mmol/L — ABNORMAL LOW (ref 1.15–1.40)
HCT: 39 % (ref 39.0–52.0)
Hemoglobin: 13.3 g/dL (ref 13.0–17.0)
O2 Saturation: 100 %
Potassium: 4.2 mmol/L (ref 3.5–5.1)
Sodium: 123 mmol/L — ABNORMAL LOW (ref 135–145)
TCO2: 23 mmol/L (ref 22–32)
pCO2, Ven: 26.9 mmHg — ABNORMAL LOW (ref 44–60)
pH, Ven: 7.528 — ABNORMAL HIGH (ref 7.25–7.43)
pO2, Ven: 175 mmHg — ABNORMAL HIGH (ref 32–45)

## 2021-11-22 LAB — COMPREHENSIVE METABOLIC PANEL
ALT: 13 U/L (ref 0–44)
AST: 21 U/L (ref 15–41)
Albumin: 4.2 g/dL (ref 3.5–5.0)
Alkaline Phosphatase: 63 U/L (ref 38–126)
Anion gap: 13 (ref 5–15)
BUN: 12 mg/dL (ref 8–23)
CO2: 22 mmol/L (ref 22–32)
Calcium: 9.5 mg/dL (ref 8.9–10.3)
Chloride: 91 mmol/L — ABNORMAL LOW (ref 98–111)
Creatinine, Ser: 0.89 mg/dL (ref 0.61–1.24)
GFR, Estimated: >60 mL/min (ref >60)
Glucose, Bld: 125 mg/dL — ABNORMAL HIGH (ref 70–99)
Potassium: 4.3 mmol/L (ref 3.5–5.1)
Sodium: 126 mmol/L — ABNORMAL LOW (ref 135–145)
Total Bilirubin: 1.4 mg/dL — ABNORMAL HIGH (ref 0.3–1.2)
Total Protein: 7.1 g/dL (ref 6.5–8.1)

## 2021-11-22 LAB — AMMONIA: Ammonia: 19 umol/L (ref 9–35)

## 2021-11-22 MED ORDER — FOLIC ACID 1 MG PO TABS
1.0000 mg | ORAL_TABLET | Freq: Every day | ORAL | Status: DC
  Administered 2021-11-23 – 2021-11-28 (×6): 1 mg via ORAL
  Filled 2021-11-22 (×6): qty 1

## 2021-11-22 MED ORDER — ACETAMINOPHEN 650 MG RE SUPP: 650.0000 mg | Freq: Four times a day (QID) | RECTAL | Status: DC | PRN

## 2021-11-22 MED ORDER — LIDOCAINE HCL URETHRAL/MUCOSAL 2 % EX GEL
1.0000 "application " | Freq: Once | CUTANEOUS | Status: AC
  Administered 2021-11-22: 1 via TOPICAL
  Filled 2021-11-22: qty 11

## 2021-11-22 MED ORDER — HALOPERIDOL LACTATE 5 MG/ML IJ SOLN
5.0000 mg | Freq: Once | INTRAMUSCULAR | Status: DC
  Filled 2021-11-22: qty 1

## 2021-11-22 MED ORDER — LORAZEPAM 2 MG/ML IJ SOLN
1.0000 mg | Freq: Once | INTRAMUSCULAR | Status: AC
  Administered 2021-11-22: 1 mg via INTRAVENOUS
  Filled 2021-11-22: qty 1

## 2021-11-22 MED ORDER — HALOPERIDOL LACTATE 5 MG/ML IJ SOLN
2.0000 mg | Freq: Once | INTRAMUSCULAR | Status: AC
  Administered 2021-11-22: 2 mg via INTRAVENOUS

## 2021-11-22 MED ORDER — LORAZEPAM 2 MG/ML IJ SOLN
1.0000 mg | INTRAMUSCULAR | Status: DC | PRN
  Administered 2021-11-23: 1 mg via INTRAVENOUS
  Administered 2021-11-24: 2 mg via INTRAVENOUS
  Filled 2021-11-22 (×3): qty 1

## 2021-11-22 MED ORDER — THIAMINE HCL 100 MG/ML IJ SOLN
100.0000 mg | Freq: Every day | INTRAMUSCULAR | Status: DC
  Administered 2021-11-22: 100 mg via INTRAVENOUS
  Filled 2021-11-22: qty 2

## 2021-11-22 MED ORDER — THIAMINE HCL 100 MG/ML IJ SOLN
250.0000 mg | INTRAVENOUS | Status: DC
  Administered 2021-11-24: 250 mg via INTRAVENOUS
  Filled 2021-11-22 (×3): qty 2.5

## 2021-11-22 MED ORDER — THIAMINE HCL 100 MG/ML IJ SOLN
500.0000 mg | Freq: Three times a day (TID) | INTRAVENOUS | Status: AC
  Administered 2021-11-23 – 2021-11-24 (×6): 500 mg via INTRAVENOUS
  Filled 2021-11-22 (×8): qty 5

## 2021-11-22 MED ORDER — SODIUM CHLORIDE 0.9 % IV SOLN
1.0000 mg | Freq: Once | INTRAVENOUS | Status: AC
  Administered 2021-11-22: 1 mg via INTRAVENOUS
  Filled 2021-11-22: qty 0.2

## 2021-11-22 MED ORDER — ADULT MULTIVITAMIN W/MINERALS CH
1.0000 | ORAL_TABLET | Freq: Every day | ORAL | Status: DC
  Administered 2021-11-23 – 2021-11-24 (×2): 1 via ORAL
  Filled 2021-11-22 (×2): qty 1

## 2021-11-22 MED ORDER — THIAMINE HCL 100 MG/ML IJ SOLN
200.0000 mg | Freq: Every day | INTRAVENOUS | Status: DC
  Filled 2021-11-22 (×2): qty 2

## 2021-11-22 MED ORDER — ACETAMINOPHEN 325 MG PO TABS
650.0000 mg | ORAL_TABLET | Freq: Four times a day (QID) | ORAL | Status: DC | PRN
  Administered 2021-11-24 – 2021-11-26 (×2): 650 mg via ORAL
  Filled 2021-11-22 (×2): qty 2

## 2021-11-22 MED ORDER — THIAMINE HCL 100 MG/ML IJ SOLN
500.0000 mg | INTRAVENOUS | Status: DC
  Filled 2021-11-22: qty 5

## 2021-11-22 MED ORDER — LORAZEPAM 1 MG PO TABS
1.0000 mg | ORAL_TABLET | ORAL | Status: DC | PRN
  Administered 2021-11-24: 1 mg via ORAL
  Filled 2021-11-22: qty 1

## 2021-11-22 MED ORDER — SODIUM CHLORIDE 0.9 % IV SOLN: INTRAVENOUS | Status: DC

## 2021-11-22 NOTE — ED Notes (Addendum)
In and out cath attempted x 2 without success. MD notified; received verbal order for coude with xylocaine gel

## 2021-11-22 NOTE — ED Notes (Signed)
RN attempted to in and out pt 3 times. RN unsuccessful.

## 2021-11-22 NOTE — H&P (Signed)
History and Physical    LORENZ DONLEY SJG:[id number] DOB: 1940-12-26 DOA: 11/22/2021  PCP: [name], MD  Patient coming from: Home  Chief Complaint: Altered mental status  HPI: Carlos Wise is a 81 y.o. male with medical history significant of alcohol use disorder, chronic hyponatremia, seizure disorder, hypertension, hyperlipidemia, prostate cancer status post radiation, prolonged QT interval, chronic HFpEF, peripheral neuropathy, GERD, depression presenting the ED via EMS evaluation of altered mental status.  Reportedly his caretaker spoke to him yesterday at 1520 and he was normal.  Today when caretaker arrived, patient had vomited and was not making sense when speaking.  Caretaker told EMS that patient is a daily drinker but unsure if he drank last night.  Patient afebrile on arrival to the ED.  Very confused and agitated.  Labs showing WBC 12.7, hemoglobin 13.2, platelet count 262k.  Sodium 126, potassium 4.3, chloride 91, bicarb 22, BUN 12, creatinine 0.8, glucose 125.  T. bili 1.4, remainder of LFTs normal.  Ammonia level normal.  UA and UDS not collected yet.  Blood ethanol level not collected yet.  VBG showing pH 7.52, PCO2 26.9.  Chest x-ray not suggestive of pneumonia.  CT head negative for acute finding. Patient was given Haldol, Ativan, thiamine, and folic acid.  Patient is somnolent but arousable.  Very confused and disoriented.  He is mumbling and his speech is not making any sense.  Not able to give any history.  Review of Systems:  Review of Systems  All other systems reviewed and are negative.   Past Medical History:  Diagnosis Date   Herpes zoster    Hyperlipemia    Hypertension    Intestinal polyp    hyperplastic   Prostate cancer (Real)    S/P radiation therapy 12/12/2014 through 02/06/2015                                                      Prostate 7800 cGy in 40 sessions, seminal vesicles 5600 cGy in 40 sessions                            Past Surgical  History:  Procedure Laterality Date   Carotid Artery Catherization     PROSTATE BIOPSY  06/27/2014     reports that he has quit smoking. He has never used smokeless tobacco. He reports current alcohol use. He reports that he does not use drugs.  Allergies  Allergen Reactions   Penicillins Other (See Comments)    Patient states no allergy, but has preference that he does not want this medication    Family History  Problem Relation Age of Onset   Colon cancer Mother    Prostate cancer Father    Heart attack Father     Prior to Admission medications   Medication Sig Start Date End Date Taking? Authorizing Provider  bismuth subsalicylate (PEPTO BISMOL) 262 MG/15ML suspension Take 30 mLs by mouth every 6 (six) hours as needed for indigestion.   Yes [provider]  cholecalciferol (VITAMIN D3) 25 MCG (1000 UNIT) tablet Take 1,000 Units by mouth daily.   Yes [provider]  clobetasol (TEMOVATE) 0.05 % external solution Apply 1 application  topically 2 (two) times daily as needed for itching. 10/13/21  Yes [provider]  Cyanocobalamin (VITAMIN B-12 ER PO) Take 1,000 mcg by mouth daily.   Yes [provider]  escitalopram (LEXAPRO) 20 MG tablet Take 20 mg by mouth daily. 02/03/20  Yes [provider]  fluticasone (FLONASE) 50 MCG/ACT nasal spray Place 2 sprays into both nostrils daily as needed for allergies or rhinitis.  12/10/19  Yes [provider]  gabapentin (NEURONTIN) 300 MG capsule Take 300 mg by mouth 2 (two) times daily. 01/27/20  Yes [provider]  ketotifen (ALLERGY EYE DROPS) 0.025 % ophthalmic solution Place 1 drop into both eyes daily as needed (red eyes).   Yes [provider]  loratadine (CLARITIN) 10 MG tablet Take 10 mg by mouth daily.   Yes [provider]  mirabegron ER (MYRBETRIQ) 25 MG TB24 tablet Take 25 mg by mouth daily.   Yes [provider]  Naproxen Sod-diphenhydrAMINE  (ALEVE PM) 220-25 MG TABS Take 1 tablet by mouth at bedtime.   Yes [provider]  OVER THE COUNTER MEDICATION Take 1 tablet by mouth 2 (two) times daily. [employer] for mental clarity   Yes [provider]  pantoprazole (PROTONIX) 40 MG tablet Take 40 mg by mouth daily before breakfast.    Yes [provider]  Phenyleph-Doxylamine-DM-APAP (NYQUIL SEVERE COLD/FLU) 5-6.25-10-325 MG/15ML LIQD Take 30 mLs by mouth daily as needed (cough).   Yes [provider]  polyethylene glycol (MIRALAX / GLYCOLAX) 17 g packet Take 17 g by mouth daily as needed for severe constipation. 02/09/20  Yes Hosie Poisson, MD  sodium chloride (OCEAN) 0.65 % SOLN nasal spray Place 1 spray into both nostrils daily as needed for congestion.   Yes [provider]  sodium chloride 1 g tablet Take 1 g by mouth daily.   Yes [provider]  triamcinolone cream (KENALOG) 0.1 % Apply 1 application  topically 2 (two) times daily as needed for skin breakdown. 10/13/21  Yes [provider]    Physical Exam: Vitals:   11/22/21 1830 11/22/21 1930 11/22/21 2000 11/22/21 2030  BP: 122/69 (!) 159/88 (!) 152/81 (!) 149/88  Pulse: 93 96 94 91  Resp: (!) '27 17 18 '$ (!) 21  Temp:      TempSrc:      SpO2: 96% 97% 98% 95%  Weight:      Height:        Physical Exam Vitals reviewed.  Constitutional:      General: He is not in acute distress. HENT:     Head: Normocephalic and atraumatic.     Mouth/Throat:     Comments: Very dry mucous membranes Eyes:     Extraocular Movements: Extraocular movements intact.     Conjunctiva/sclera: Conjunctivae normal.  Cardiovascular:     Rate and Rhythm: Normal rate and regular rhythm.     Pulses: Normal pulses.  Pulmonary:     Effort: Pulmonary effort is normal. No respiratory distress.     Breath sounds: Normal breath sounds. No wheezing or rales.  Abdominal:     General: Bowel sounds are normal. There is  no distension.     Palpations: Abdomen is soft.     Tenderness: There is no abdominal tenderness. There is no guarding or rebound.  Musculoskeletal:        General: No swelling or tenderness.     Cervical back: Normal range of motion. No rigidity.  Skin:    General: Skin is warm and dry.  Neurological:     Mental Status: He  is alert.     Comments: Somnolent but arousable Very confused and disoriented Speech incomprehensible Not following commands Moving all extremities spontaneously, no focal weakness. No obvious facial droop.      Labs on Admission: I have personally reviewed following labs and imaging studies  CBC: Recent Labs  Lab 11/22/21 1336 11/22/21 1359  WBC 12.7*  --   NEUTROABS 11.2*  --   HGB 13.2 13.3  HCT 36.4* 39.0  MCV 90.1  --   PLT 262  --    Basic Metabolic Panel: Recent Labs  Lab 11/22/21 1336 11/22/21 1359  NA 126* 123*  K 4.3 4.2  CL 91*  --   CO2 22  --   GLUCOSE 125*  --   BUN 12  --   CREATININE 0.89  --   CALCIUM 9.5  --    GFR: Estimated Creatinine Clearance: 71.9 mL/min (by C-G formula based on SCr of 0.89 mg/dL). Liver Function Tests: Recent Labs  Lab 11/22/21 1336  AST 21  ALT 13  ALKPHOS 63  BILITOT 1.4*  PROT 7.1  ALBUMIN 4.2   No results for input(s): "LIPASE", "AMYLASE" in the last 168 hours. Recent Labs  Lab 11/22/21 1314  AMMONIA 19   Coagulation Profile: No results for input(s): "INR", "PROTIME" in the last 168 hours. Cardiac Enzymes: No results for input(s): "CKTOTAL", "CKMB", "CKMBINDEX", "TROPONINI" in the last 168 hours. BNP (last 3 results) No results for input(s): "PROBNP" in the last 8760 hours. HbA1C: No results for input(s): "HGBA1C" in the last 72 hours. CBG: No results for input(s): "GLUCAP" in the last 168 hours. Lipid Profile: No results for input(s): "CHOL", "HDL", "LDLCALC", "TRIG", "CHOLHDL", "LDLDIRECT" in the last 72 hours. Thyroid Function Tests: No results for input(s): "TSH",  "T4TOTAL", "FREET4", "T3FREE", "THYROIDAB" in the last 72 hours. Anemia Panel: No results for input(s): "VITAMINB12", "FOLATE", "FERRITIN", "TIBC", "IRON", "RETICCTPCT" in the last 72 hours. Urine analysis:    Component Value Date/Time   COLORURINE YELLOW 02/05/2020 1045   APPEARANCEUR CLEAR 02/05/2020 1045   LABSPEC 1.031 (H) 02/05/2020 1045   PHURINE 5.0 02/05/2020 1045   GLUCOSEU NEGATIVE 02/05/2020 1045   HGBUR NEGATIVE 02/05/2020 1045   BILIRUBINUR NEGATIVE 02/05/2020 1045   KETONESUR 20 (A) 02/05/2020 1045   PROTEINUR 30 (A) 02/05/2020 1045   NITRITE NEGATIVE 02/05/2020 [place] 02/05/2020 1045    Radiological Exams on Admission: I have personally reviewed images CT Head Wo Contrast  Result Date: 11/22/2021 CLINICAL DATA:  Altered mental status. EXAM: CT HEAD WITHOUT CONTRAST TECHNIQUE: Contiguous axial images were obtained from the base of the skull through the vertex without intravenous contrast. RADIATION DOSE REDUCTION: This exam was performed according to the departmental dose-optimization program which includes automated exposure control, adjustment of the mA and/or kV according to patient size and/or use of iterative reconstruction technique. COMPARISON:  August 31, 2020 FINDINGS: Brain: There is mild to moderate severity cerebral atrophy with widening of the extra-axial spaces and ventricular dilatation. There are areas of decreased attenuation within the white matter tracts of the supratentorial brain, consistent with microvascular disease changes. Vascular: No hyperdense vessel or unexpected calcification. A small metallic density foreign body is seen adjacent to the anteromedial aspect of the cavernous carotid artery on the right. Skull: Normal. Negative for fracture or focal lesion. Sinuses/Orbits: No acute finding. Other: None. IMPRESSION: 1. No acute intracranial abnormality. 2. Generalized cerebral atrophy and microvascular disease changes of the  supratentorial brain. Electronically Signed   By:  Virgina Norfolk M.D.   On: 11/22/2021 16:55   DG Chest Port 1 View  Result Date: 11/22/2021 CLINICAL DATA:  Altered mental status EXAM: PORTABLE CHEST 1 VIEW COMPARISON:  02/04/2020 FINDINGS: The heart size and mediastinal contours are within normal limits. Both lungs are clear. Chronic fracture deformities of the posterior left ribs. IMPRESSION: No acute abnormality of the lungs in AP portable projection. Electronically Signed   By: Delanna Ahmadi M.D.   On: 11/22/2021 13:55    EKG: Independently reviewed.  Sinus rhythm, QTc 522, diffuse borderline T wave abnormalities.  QT interval increased since prior tracing but otherwise no significant change.  Assessment and Plan  Acute metabolic encephalopathy Patient is somnolent but arousable.  Very confused and disoriented.  His speech is incomprehensible and he is not following commands.  However, moving all extremities spontaneously, no focal weakness.  No obvious facial droop.  CT head negative for acute finding.  Ammonia level normal.?Wernicke's encephalopathy given chronic alcohol use.?Infectious etiology.  Chest x-ray not suggestive of pneumonia.  No fever or meningeal signs.  History of seizure disorder no antiepileptics listed in home medications. -IV thiamine 500 mg 3 times a day x2 days, then 250 mg daily x5 days -Brain MRI with and without contrast -EEG -Thiamine level -B12 level -Blood ethanol level -TSH -UA -UDS -Blood culture x2 -Discussed with Dr. Erlinda Hong, neurology will consult.  Alcohol use disorder -CIWA protocol; Ativan as needed -Thiamine, folate, multivitamin  QT prolongation -Cardiac monitoring -Keep potassium >4 and magnesium >2 -Avoid QT prolonging drugs if possible -Repeat EKG in a.m.  Acute on chronic hyponatremia Sodium currently 126, chronically in the upper 120s to low 130s.  He appears very dry on exam. -Normal saline at 125 cc/h -Monitor BMP every 6 hours and  adjust rate of fluids accordingly -Goal rate of correction 4 to 6 mEq in 24 hours, avoid rapid correction -Serum osmolarity -Urine sodium, osmolarity -TSH -Cortisol  Emesis No significant elevation of LFTs. -CT ordered to rule out infectious process or bowel obstruction  Mild leukocytosis No fever.  Chest x-ray not suggestive of pneumonia. -UA pending -CT abdomen pelvis pending -Repeat CBC in a.m.  Chronic HFpEF No signs of volume overload. -Monitor volume status closely  Hypertension Hyperlipidemia Peripheral neuropathy GERD Depression -Resume home p.o. meds when more awake and alert.  DVT prophylaxis: SCDs Code Status: Full Code Family Communication: No family available at this time. Consults called: Neurology Level of care: Progressive Care Unit Admission status: It is my clinical opinion that admission to INPATIENT is reasonable and necessary because of the expectation that this patient will require hospital care that crosses at least 2 midnights to treat this condition based on the medical complexity of the problems presented.  Given the aforementioned information, the predictability of an adverse outcome is felt to be significant.   Shela Leff MD [facility]  If 7PM-7AM, please contact night-coverage [email or web address]  11/22/2021, 9:55 PM

## 2021-11-22 NOTE — ED Notes (Signed)
MD performed multiple attempts to insert coude cath, unsuccessfully. Urology cart requested for consult.

## 2021-11-22 NOTE — ED Notes (Signed)
235m via bladder scanner

## 2021-11-22 NOTE — ED Provider Notes (Addendum)
[facility] EMERGENCY DEPARTMENT Provider Note   CSN: [id number] Arrival date & time: 11/22/21  1301     History  Chief Complaint  Patient presents with   Altered Mental Status    Carlos Wise is a 81 y.o. male.  HPI Patient presents by EMS for evaluation of altered mental status.  His caregiver arrived to his home today and found that the patient had vomited and was "not making sense."  He was apparently normal, during a telephone call, yesterday.  Patient reportedly drinks alcohol daily.  He is unable to give any history.    Home Medications Prior to Admission medications   Medication Sig Start Date End Date Taking? Authorizing Provider  bismuth subsalicylate (PEPTO BISMOL) 262 MG/15ML suspension Take 30 mLs by mouth every 6 (six) hours as needed for indigestion.   Yes [provider]  cholecalciferol (VITAMIN D3) 25 MCG (1000 UNIT) tablet Take 1,000 Units by mouth daily.   Yes [provider]  clobetasol (TEMOVATE) 0.05 % external solution Apply 1 application  topically 2 (two) times daily as needed for itching. 10/13/21  Yes [provider]  Cyanocobalamin (VITAMIN B-12 ER PO) Take 1,000 mcg by mouth daily.   Yes [provider]  escitalopram (LEXAPRO) 20 MG tablet Take 20 mg by mouth daily. 02/03/20  Yes [provider]  fluticasone (FLONASE) 50 MCG/ACT nasal spray Place 2 sprays into both nostrils daily as needed for allergies or rhinitis.  12/10/19  Yes [provider]  gabapentin (NEURONTIN) 300 MG capsule Take 300 mg by mouth 2 (two) times daily. 01/27/20  Yes [provider]  ketotifen (ALLERGY EYE DROPS) 0.025 % ophthalmic solution Place 1 drop into both eyes daily as needed (red eyes).   Yes [provider]  loratadine (CLARITIN) 10 MG tablet Take 10 mg by mouth daily.   Yes [provider]  mirabegron ER (MYRBETRIQ) 25 MG TB24 tablet Take 25 mg by mouth daily.   Yes [provider]  Naproxen Sod-diphenhydrAMINE (ALEVE PM) 220-25 MG TABS Take 1 tablet by mouth at bedtime.   Yes [provider]  OVER THE COUNTER MEDICATION Take 1 tablet by mouth 2 (two) times daily. Host Defense Mushrooms Mycobotanicals for mental clarity   Yes [provider]  pantoprazole (PROTONIX) 40 MG tablet Take 40 mg by mouth daily before breakfast.    Yes [provider]  Phenyleph-Doxylamine-DM-APAP (NYQUIL SEVERE COLD/FLU) 5-6.25-10-325 MG/15ML LIQD Take 30 mLs by mouth daily as needed (cough).   Yes [provider]  polyethylene glycol (MIRALAX / GLYCOLAX) 17 g packet Take 17 g by mouth daily as needed for severe constipation. 02/09/20  Yes Hosie Poisson, MD  sodium chloride (OCEAN) 0.65 % SOLN nasal spray Place 1 spray into both nostrils daily as needed for congestion.   Yes [provider]  sodium chloride 1 g tablet Take 1 g by mouth daily.   Yes [provider]  triamcinolone cream (KENALOG) 0.1 % Apply 1 application  topically 2 (two) times daily as needed for skin breakdown. 10/13/21  Yes [provider]      Allergies    Penicillins    Review of Systems   Review of Systems  Physical Exam Updated Vital Signs BP (!) 149/88   Pulse 91   Temp (!) 97.5 F (36.4 C) (Oral)   Resp (!) 21   Ht '5\' 8"'$  (1.727 m)   Wt 86.2 kg   SpO2 95%  BMI 28.89 kg/m  Physical Exam Vitals and nursing note reviewed.  Constitutional:      General: He is in acute distress (Restless).     Appearance: He is well-developed. He is obese. He is ill-appearing. He is not toxic-appearing or diaphoretic.     Comments: Noted to be intermittently clutching genitals  HENT:     Head: Normocephalic and atraumatic.     Right Ear: External ear normal.     Left Ear: External ear normal.     Nose: No congestion or rhinorrhea.     Mouth/Throat:     Mouth: Mucous membranes are dry.  Eyes:     Conjunctiva/sclera: Conjunctivae normal.      Pupils: Pupils are equal, round, and reactive to light.  Neck:     Trachea: Phonation normal.  Cardiovascular:     Rate and Rhythm: Normal rate.  Pulmonary:     Effort: Pulmonary effort is normal. No respiratory distress.     Breath sounds: No stridor.  Abdominal:     General: There is no distension.     Palpations: Abdomen is soft.     Tenderness: There is no abdominal tenderness.  Musculoskeletal:        General: Normal range of motion.     Cervical back: Normal range of motion and neck supple.  Skin:    General: Skin is warm and dry.  Neurological:     Mental Status: He is alert.     GCS: GCS eye subscore is 4. GCS verbal subscore is 3. GCS motor subscore is 5.     Cranial Nerves: No cranial nerve deficit.     Motor: No abnormal muscle tone.     Coordination: Coordination normal.  Psychiatric:        Attention and Perception: He is inattentive.        Mood and Affect: Affect is flat.        Speech: He is noncommunicative.        Behavior: Behavior is agitated and hyperactive.        Cognition and Memory: Cognition is impaired. Memory is impaired.        Judgment: Judgment is inappropriate.     ED Results / Procedures / Treatments   Labs (all labs ordered are listed, but only abnormal results are displayed) Labs Reviewed  COMPREHENSIVE METABOLIC PANEL - Abnormal; Notable for the following components:      Result Value   Sodium 126 (*)    Chloride 91 (*)    Glucose, Bld 125 (*)    Total Bilirubin 1.4 (*)    All other components within normal limits  CBC WITH DIFFERENTIAL/PLATELET - Abnormal; Notable for the following components:   WBC 12.7 (*)    RBC 4.04 (*)    HCT 36.4 (*)    MCHC 36.3 (*)    Neutro Abs 11.2 (*)    Abs Immature Granulocytes 0.09 (*)    All other components within normal limits  I-STAT VENOUS BLOOD GAS, ED - Abnormal; Notable for the following components:   pH, Ven 7.528 (*)    pCO2, Ven 26.9 (*)    pO2, Ven 175 (*)    Sodium 123 (*)     Calcium, Ion 1.02 (*)    All other components within normal limits  AMMONIA  ETHANOL  RAPID URINE DRUG SCREEN, HOSP PERFORMED  URINALYSIS, ROUTINE W REFLEX MICROSCOPIC    EKG EKG Interpretation  Date/Time:  Saturday November 22 2021 13:07:56 EDT Ventricular  Rate:  93 PR Interval:  183 QRS Duration: 70 QT Interval:  419 QTC Calculation: 522 R Axis:   64 Text Interpretation: Sinus rhythm Borderline T abnormalities, diffuse leads Prolonged QT interval Since last tracing QT has lengthened Otherwise no significant change Confirmed by Daleen Bo [phone]) on 11/22/2021 1:10:19 PM  Radiology CT Head Wo Contrast  Result Date: 11/22/2021 CLINICAL DATA:  Altered mental status. EXAM: CT HEAD WITHOUT CONTRAST TECHNIQUE: Contiguous axial images were obtained from the base of the skull through the vertex without intravenous contrast. RADIATION DOSE REDUCTION: This exam was performed according to the departmental dose-optimization program which includes automated exposure control, adjustment of the mA and/or kV according to patient size and/or use of iterative reconstruction technique. COMPARISON:  August 31, 2020 FINDINGS: Brain: There is mild to moderate severity cerebral atrophy with widening of the extra-axial spaces and ventricular dilatation. There are areas of decreased attenuation within the white matter tracts of the supratentorial brain, consistent with microvascular disease changes. Vascular: No hyperdense vessel or unexpected calcification. A small metallic density foreign body is seen adjacent to the anteromedial aspect of the cavernous carotid artery on the right. Skull: Normal. Negative for fracture or focal lesion. Sinuses/Orbits: No acute finding. Other: None. IMPRESSION: 1. No acute intracranial abnormality. 2. Generalized cerebral atrophy and microvascular disease changes of the supratentorial brain. Electronically Signed   By: Virgina Norfolk M.D.   On: 11/22/2021 16:55   DG Chest Port 1  View  Result Date: 11/22/2021 CLINICAL DATA:  Altered mental status EXAM: PORTABLE CHEST 1 VIEW COMPARISON:  02/04/2020 FINDINGS: The heart size and mediastinal contours are within normal limits. Both lungs are clear. Chronic fracture deformities of the posterior left ribs. IMPRESSION: No acute abnormality of the lungs in AP portable projection. Electronically Signed   By: Delanna Ahmadi M.D.   On: 11/22/2021 13:55    Procedures BLADDER CATHETERIZATION  Date/Time: 11/22/2021 10:43 PM  Performed by: Daleen Bo, MD Authorized by: Daleen Bo, MD   Consent:    Consent obtained:  Emergent situation   Risks discussed:  Urethral injury   Alternatives discussed:  No treatment Universal protocol:    Patient identity confirmed:  Hospital-assigned identification number Pre-procedure details:    Procedure purpose:  Therapeutic   Preparation: Patient was prepped and draped in usual sterile fashion   Anesthesia:    Anesthesia method:  Topical application   Topical anesthetic:  Lidocaine gel Procedure details:    Provider performed due to:  Nurse unable to complete   Catheter insertion:  Indwelling   Catheter type:  Coude   Catheter size:  16 Fr   Number of attempts:  2 Post-procedure details:    Procedure completion:  Tolerated well, no immediate complications Comments:     Unable to catheterize urinary bladder.  We will consult urology     Medications Ordered in ED Medications  0.9 %  sodium chloride infusion ( Intravenous New Bag/Given 11/22/21 1505)  thiamine (B-1) injection 100 mg (100 mg Intravenous Given 11/22/21 1507)  thiamine (B-1) 200 mg in sodium chloride 0.9 % 50 mL IVPB (has no administration in time range)  lidocaine (XYLOCAINE) 2 % jelly 1 application  (has no administration in time range)  folic acid 1 mg in sodium chloride 0.9 % 50 mL IVPB (0 mg Intravenous Stopped 11/22/21 1553)  LORazepam (ATIVAN) injection 1 mg (1 mg Intravenous Given 11/22/21 1456)  haloperidol  lactate (HALDOL) injection 2 mg (2 mg Intravenous Given 11/22/21 1634)  LORazepam (ATIVAN)  injection 1 mg (1 mg Intravenous Given 11/22/21 1644)    ED Course/ Medical Decision Making/ A&P Clinical Course as of 11/22/21 2245  Sat Nov 22, 2021  2109 Patient continues to not be able to talk or give history.  He appears acutely encephalopathic. [EW]    Clinical Course User Index [EW] Daleen Bo, MD                           Medical Decision Making Elderly patient, lives at home, apparently alone with caregiver support daily.  He has a history of malignant prostate cancer, hyponatremia, prolonged QT interval, and alcohol use daily.  Previously had altered mental status, 2 years ago and presents for same today.  Amount and/or Complexity of Data Reviewed External Data Reviewed:     Details: Office notes from PCP visit 09/22/2021, reviewed in the EMR.  He apparently had diarrhea at that time. Labs: ordered.    Details: CBC, metabolic panel, ammonia level, alcohol level, rapid drug screen, urinalysis-normal except sodium low, chloride low, glucose high, total bilirubin high, white count high, venous pH high Radiology: ordered and independent interpretation performed.    Details: CT head, chest x-ray-no acute abnormalities. Discussion of management or test interpretation with external provider(s): Consult hospitalist to admit for encephalopathy, nonspecified.  Consult urology who agrees to come to the hospital and place a urinary bladder catheter.  Risk Prescription drug management. Decision regarding hospitalization. Risk Details: Elderly male, lives alone with caregiver that visits regularly.  Found confused today.  Reported to be heavy alcohol user.  Differential diagnosis includes seizure, metabolic disorder, acute intoxication, CVA, and multifactorial delirium.  Screening evaluation does not show any acute reversible causes of altered mental status.  Treated for possible Warnicke's  encephalopathy with thiamine in the ED.           Final Clinical Impression(s) / ED Diagnoses Final diagnoses:  Altered mental status, unspecified altered mental status type  Hyponatremia  Alcoholism [facility])  Urinary retention    Rx / DC Orders ED Discharge Orders     None         Daleen Bo, MD 11/22/21 2150    Daleen Bo, MD 11/22/21 2245

## 2021-11-22 NOTE — ED Triage Notes (Signed)
Pt to ED via EMS from home.  Pt lives by himself.  Caretaker spoke with him yesterday at 1520 on the phone and he was normal.  Today when caretaker arrived, pt had vomited in bed and was not making sense when speaking.  Pt unable to answer questions appropriately, moving all extremities.  Pt is alert on arrival.  Per EMS pt's caretaker states pt is a daily drinker but unsure if he drank last night.  Pt's sister is Secundino Ginger. Lemmie Evens [phone] C [phone]  Caretaker is Hermelinda Dellen [phone]   EMS VS 142/84 96bpm 97% RA Cbg 126

## 2021-11-23 ENCOUNTER — Inpatient Hospital Stay (HOSPITAL_COMMUNITY): Payer: Medicare HMO

## 2021-11-23 DIAGNOSIS — G9341 Metabolic encephalopathy: Secondary | ICD-10-CM | POA: Diagnosis not present

## 2021-11-23 DIAGNOSIS — R4182 Altered mental status, unspecified: Secondary | ICD-10-CM | POA: Diagnosis not present

## 2021-11-23 LAB — RAPID URINE DRUG SCREEN, HOSP PERFORMED
Amphetamines: NOT DETECTED
Barbiturates: NOT DETECTED
Benzodiazepines: NOT DETECTED
Cocaine: NOT DETECTED
Opiates: NOT DETECTED
Tetrahydrocannabinol: NOT DETECTED

## 2021-11-23 LAB — URINALYSIS, MICROSCOPIC (REFLEX)

## 2021-11-23 LAB — CBC
HCT: 34.9 % — ABNORMAL LOW (ref 39.0–52.0)
Hemoglobin: 12.2 g/dL — ABNORMAL LOW (ref 13.0–17.0)
MCH: 32.4 pg (ref 26.0–34.0)
MCHC: 35 g/dL (ref 30.0–36.0)
MCV: 92.6 fL (ref 80.0–100.0)
Platelets: 243 10*3/uL (ref 150–400)
RBC: 3.77 MIL/uL — ABNORMAL LOW (ref 4.22–5.81)
RDW: 12.4 % (ref 11.5–15.5)
WBC: 13.5 10*3/uL — ABNORMAL HIGH (ref 4.0–10.5)
nRBC: 0 % (ref 0.0–0.2)

## 2021-11-23 LAB — BASIC METABOLIC PANEL WITH GFR
Anion gap: 12 (ref 5–15)
BUN: 15 mg/dL (ref 8–23)
CO2: 21 mmol/L — ABNORMAL LOW (ref 22–32)
Calcium: 9.3 mg/dL (ref 8.9–10.3)
Chloride: 97 mmol/L — ABNORMAL LOW (ref 98–111)
Creatinine, Ser: 0.88 mg/dL (ref 0.61–1.24)
GFR, Estimated: >60 mL/min
Glucose, Bld: 104 mg/dL — ABNORMAL HIGH (ref 70–99)
Potassium: 3.7 mmol/L (ref 3.5–5.1)
Sodium: 130 mmol/L — ABNORMAL LOW (ref 135–145)

## 2021-11-23 LAB — URINALYSIS, ROUTINE W REFLEX MICROSCOPIC
Bilirubin Urine: NEGATIVE
Glucose, UA: NEGATIVE mg/dL
Ketones, ur: NEGATIVE mg/dL
Nitrite: NEGATIVE
Protein, ur: 100 mg/dL — AB
Specific Gravity, Urine: <1.005 — ABNORMAL LOW (ref 1.005–1.030)
pH: 6.5 (ref 5.0–8.0)

## 2021-11-23 LAB — BASIC METABOLIC PANEL
Anion gap: 10 (ref 5–15)
BUN: 16 mg/dL (ref 8–23)
CO2: 20 mmol/L — ABNORMAL LOW (ref 22–32)
Calcium: 8.6 mg/dL — ABNORMAL LOW (ref 8.9–10.3)
Chloride: 99 mmol/L (ref 98–111)
Creatinine, Ser: 0.84 mg/dL (ref 0.61–1.24)
GFR, Estimated: >60 mL/min (ref >60)
Glucose, Bld: 102 mg/dL — ABNORMAL HIGH (ref 70–99)
Potassium: 3.5 mmol/L (ref 3.5–5.1)
Sodium: 129 mmol/L — ABNORMAL LOW (ref 135–145)

## 2021-11-23 LAB — OSMOLALITY: Osmolality: 271 mOsm/kg — ABNORMAL LOW (ref 275–295)

## 2021-11-23 LAB — TSH: TSH: 2.352 u[IU]/mL (ref 0.350–4.500)

## 2021-11-23 LAB — OSMOLALITY, URINE: Osmolality, Ur: 126 mosm/kg — ABNORMAL LOW (ref 300–900)

## 2021-11-23 LAB — SODIUM, URINE, RANDOM: Sodium, Ur: 20 mmol/L

## 2021-11-23 LAB — VITAMIN B12: Vitamin B-12: 919 pg/mL — ABNORMAL HIGH (ref 180–914)

## 2021-11-23 LAB — MAGNESIUM: Magnesium: 2 mg/dL (ref 1.7–2.4)

## 2021-11-23 MED ORDER — SODIUM CHLORIDE 0.9 % IV SOLN: INTRAVENOUS | Status: AC

## 2021-11-23 MED ORDER — POTASSIUM CHLORIDE CRYS ER 20 MEQ PO TBCR
40.0000 meq | EXTENDED_RELEASE_TABLET | Freq: Once | ORAL | Status: AC
  Administered 2021-11-23: 40 meq via ORAL
  Filled 2021-11-23: qty 2

## 2021-11-23 MED ORDER — LEVETIRACETAM 500 MG PO TABS
500.0000 mg | ORAL_TABLET | Freq: Two times a day (BID) | ORAL | Status: DC
  Administered 2021-11-23 – 2021-11-28 (×10): 500 mg via ORAL
  Filled 2021-11-23 (×10): qty 1

## 2021-11-23 NOTE — Progress Notes (Addendum)
EEG complete - results pending 

## 2021-11-23 NOTE — Procedures (Signed)
Patient Name: Carlos Wise  MRN: [id number]  Epilepsy Attending: Lora Havens  Referring Physician/Provider: Rosalin Hawking, MD  Date: 11/23/2021 Duration: 21.14 mins  Patient history: 81 y.o. male with PMH of alcohol abuse, chronic hyponatremia, seizure disorder, hypertension, hyperlipidemia, prostate cancer status post radiation, CHF presented to ED for AMS with nause and vomiting and confusion and dysarthria. EEG to evaluate for seizure.  Level of alertness: Awake, asleep  AEDs during EEG study: None  Technical aspects: This EEG study was done with scalp electrodes positioned according to the 10-20 International system of electrode placement. Electrical activity was acquired at a sampling rate of '500Hz'$  and reviewed with a high frequency filter of '70Hz'$  and a low frequency filter of '1Hz'$ . EEG data were recorded continuously and digitally stored.   Description: The posterior dominant rhythm consists of '8Hz'$  activity of moderate voltage (25-35 uV) seen predominantly in posterior head regions, symmetric and reactive to eye opening and eye closing. Sleep was characterized by vertex waves, sleep spindles (12 to 14 Hz), maximal frontocentral region. EEG showed continuous 3 to 6 Hz theta-delta slowing in left hemisphere, maximal frontotemporal region. Hyperventilation and photic stimulation were not performed.     ABNORMALITY - Continuous slow, left hemisphere, maximal frontotemporal region  IMPRESSION: This study is suggestive of cortical dysfunction arising from left hemisphere, maximal frontotemporal region likely secondary to underlying structural abnormality, post-ictal state. No seizures or epileptiform discharges were seen throughout the recording.  Carlos Wise

## 2021-11-23 NOTE — Consult Note (Signed)
Stroke Neurology Consultation Note  Consult Requested by: Dr. Marlowe Sax  Reason for Consult: AMS  Consult Date: 11/23/21   The history was obtained from the Dr. Marlowe Sax.  During history and examination, all items were not able to obtain unless otherwise noted.  History of Present Illness:  Carlos Wise is a 81 y.o. Caucasian male with PMH of alcohol abuse, chronic hyponatremia, seizure disorder, hypertension, hyperlipidemia, prostate cancer status post radiation, CHF presented to ED for AMS. Per Dr. Marlowe Sax, his caretaker spoke to him yesterday at 1520 and he was at his baseline.  Today when caretaker came to see him, patient had nause and vomiting and confusion. Speech did not make sense. EMS called and pt was sent to ER. Patient is a daily drinker but unsure if he drank last night.  Pt had no fever in ER, WBC 12.7, has not complained HA, but agitated and disorientated. Na 123 and Cre 0.8. CT head no acute finding.    Past Medical History:  Diagnosis Date   Herpes zoster    Hyperlipemia    Hypertension    Intestinal polyp    hyperplastic   Prostate cancer ([place])    S/P radiation therapy 12/12/2014 through 02/06/2015                                                      Prostate 7800 cGy in 40 sessions, seminal vesicles 5600 cGy in 40 sessions                            Past Surgical History:  Procedure Laterality Date   Carotid Artery Catherization     PROSTATE BIOPSY  06/27/2014    Family History  Problem Relation Age of Onset   Colon cancer Mother    Prostate cancer Father    Heart attack Father     Social History:  reports that he has quit smoking. He has never used smokeless tobacco. He reports current alcohol use. He reports that he does not use drugs.  Allergies:  Allergies  Allergen Reactions   Penicillins Other (See Comments)    Patient states no allergy, but has preference that he does not want this medication    No current facility-administered medications on  file prior to encounter.   Current Outpatient Medications on File Prior to Encounter  Medication Sig Dispense Refill   bismuth subsalicylate (PEPTO BISMOL) 262 MG/15ML suspension Take 30 mLs by mouth every 6 (six) hours as needed for indigestion.     cholecalciferol (VITAMIN D3) 25 MCG (1000 UNIT) tablet Take 1,000 Units by mouth daily.     clobetasol (TEMOVATE) 0.05 % external solution Apply 1 application  topically 2 (two) times daily as needed for itching.     Cyanocobalamin (VITAMIN B-12 ER PO) Take 1,000 mcg by mouth daily.     escitalopram (LEXAPRO) 20 MG tablet Take 20 mg by mouth daily.     fluticasone (FLONASE) 50 MCG/ACT nasal spray Place 2 sprays into both nostrils daily as needed for allergies or rhinitis.      gabapentin (NEURONTIN) 300 MG capsule Take 300 mg by mouth 2 (two) times daily.     ketotifen (ALLERGY EYE DROPS) 0.025 % ophthalmic solution Place 1 drop into both eyes daily as needed (red eyes).  loratadine (CLARITIN) 10 MG tablet Take 10 mg by mouth daily.     mirabegron ER (MYRBETRIQ) 25 MG TB24 tablet Take 25 mg by mouth daily.     Naproxen Sod-diphenhydrAMINE (ALEVE PM) 220-25 MG TABS Take 1 tablet by mouth at bedtime.     OVER THE COUNTER MEDICATION Take 1 tablet by mouth 2 (two) times daily. Host Defense Mushrooms Mycobotanicals for mental clarity     pantoprazole (PROTONIX) 40 MG tablet Take 40 mg by mouth daily before breakfast.      Phenyleph-Doxylamine-DM-APAP (NYQUIL SEVERE COLD/FLU) 5-6.25-10-325 MG/15ML LIQD Take 30 mLs by mouth daily as needed (cough).     polyethylene glycol (MIRALAX / GLYCOLAX) 17 g packet Take 17 g by mouth daily as needed for severe constipation. 14 each 0   sodium chloride (OCEAN) 0.65 % SOLN nasal spray Place 1 spray into both nostrils daily as needed for congestion.     sodium chloride 1 g tablet Take 1 g by mouth daily.     triamcinolone cream (KENALOG) 0.1 % Apply 1 application  topically 2 (two) times daily as needed for skin  breakdown.      Review of Systems: A full ROS was attempted today and was not able to be performed due to AMS  Physical Examination: Temp:  [97.5 F (36.4 C)] 97.5 F (36.4 C) (06/10 1306) Pulse Rate:  [91-105] 91 (06/10 2030) Resp:  [17-27] 21 (06/10 2030) BP: (105-159)/(64-94) 149/88 (06/10 2030) SpO2:  [95 %-98 %] 95 % (06/10 2030) Weight:  [86.2 kg] 86.2 kg (06/10 1307)  General - well nourished, well developed, sleepy and drowsy.    Ophthalmologic - fundi not visualized due to noncooperation.    Cardiovascular - regular rhythm and rate  Neuro - patient drowsy sleepy, but arousable with voice and tactile stimulation.  Paucity of speech, moderate dysarthria, able to tell me his age but he is oriented to place.  Not able to answer other orientation questions, not cooperative with naming or repetition. With eye opening, eyes midline, able to tracking bilaterally.  Not consistently blinking to visual threat bilaterally.  No significant facial droop, moving all extremities symmetrically. Sensation, coordination not cooperative but withdraw to pain bilaterally symmetrically. Gait not tested.   Data Reviewed: CT ABDOMEN PELVIS WO CONTRAST  Result Date: 11/22/2021 CLINICAL DATA:  Nausea and vomiting. EXAM: CT ABDOMEN AND PELVIS WITHOUT CONTRAST TECHNIQUE: Multidetector CT imaging of the abdomen and pelvis was performed following the standard protocol without IV contrast. RADIATION DOSE REDUCTION: This exam was performed according to the departmental dose-optimization program which includes automated exposure control, adjustment of the mA and/or kV according to patient size and/or use of iterative reconstruction technique. COMPARISON:  None Available. FINDINGS: Evaluation of this exam is limited in the absence of intravenous contrast. Lower chest: The visualized lung bases are clear. There is coronary vascular calcification. No intra-abdominal free air or free fluid. Hepatobiliary: No focal  liver abnormality is seen. No gallstones, gallbladder wall thickening, or biliary dilatation. Pancreas: Unremarkable. No pancreatic ductal dilatation or surrounding inflammatory changes. Spleen: Normal in size without focal abnormality. Adrenals/Urinary Tract: The adrenal glands unremarkable. There are 2 left renal hypodense lesions with the larger measuring up to 3.5 cm in the upper pole. These lesions are suboptimally characterized on this noncontrast CT but demonstrate fluid attenuation most consistent with cysts. There is no hydronephrosis or nephrolithiasis on either side. The visualized ureters and urinary bladder appear unremarkable. Stomach/Bowel: Moderate stool throughout the colon. There is sigmoid diverticulosis without  active inflammatory changes. There is no bowel obstruction or active inflammation. The appendix is normal. Vascular/Lymphatic: Advanced aortoiliac atherosclerotic disease. The aorta is ectatic measuring up to 2.8 cm. The IVC is unremarkable. No portal venous gas. There is no adenopathy. Reproductive: Multiple fiducial markers within the prostate gland. The seminal vesicles are symmetric. Other: None Musculoskeletal: Osteopenia with degenerative changes of the spine. No acute osseous pathology. Old healed left posterior rib fractures. IMPRESSION: 1. No acute intra-abdominal or pelvic pathology. 2. Sigmoid diverticulosis. 3. Aortic Atherosclerosis (ICD10-I70.0). Electronically Signed   By: Anner Crete M.D.   On: 11/22/2021 23:27   CT Head Wo Contrast  Result Date: 11/22/2021 CLINICAL DATA:  Altered mental status. EXAM: CT HEAD WITHOUT CONTRAST TECHNIQUE: Contiguous axial images were obtained from the base of the skull through the vertex without intravenous contrast. RADIATION DOSE REDUCTION: This exam was performed according to the departmental dose-optimization program which includes automated exposure control, adjustment of the mA and/or kV according to patient size and/or use of  iterative reconstruction technique. COMPARISON:  August 31, 2020 FINDINGS: Brain: There is mild to moderate severity cerebral atrophy with widening of the extra-axial spaces and ventricular dilatation. There are areas of decreased attenuation within the white matter tracts of the supratentorial brain, consistent with microvascular disease changes. Vascular: No hyperdense vessel or unexpected calcification. A small metallic density foreign body is seen adjacent to the anteromedial aspect of the cavernous carotid artery on the right. Skull: Normal. Negative for fracture or focal lesion. Sinuses/Orbits: No acute finding. Other: None. IMPRESSION: 1. No acute intracranial abnormality. 2. Generalized cerebral atrophy and microvascular disease changes of the supratentorial brain. Electronically Signed   By: Virgina Norfolk M.D.   On: 11/22/2021 16:55   DG Chest Port 1 View  Result Date: 11/22/2021 CLINICAL DATA:  Altered mental status EXAM: PORTABLE CHEST 1 VIEW COMPARISON:  02/04/2020 FINDINGS: The heart size and mediastinal contours are within normal limits. Both lungs are clear. Chronic fracture deformities of the posterior left ribs. IMPRESSION: No acute abnormality of the lungs in AP portable projection. Electronically Signed   By: Delanna Ahmadi M.D.   On: 11/22/2021 13:55    Assessment: 81 y.o. male with PMH of alcohol abuse, chronic hyponatremia, seizure disorder, hypertension, hyperlipidemia, prostate cancer status post radiation, CHF presented to ED for AMS with nause and vomiting and confusion and dysarthria. Exam concerning for encephalopathy, recommend MRI with and without contrast, EEG, and encephalopathy work-up with ammonia level, blood culture, UA.  Agree with B1 high-dose for potential Wernicke's management.  Plan: -Continue encephalopathy work-up, check ammonia level, blood culture, UA, etc -MRI brain with and without contrast -EEG -Agree with B1 high-dose for potential Wernicke's   -discussed with Dr. Marlowe Sax -will follow  Thank you for this consultation and allowing [place] to participate in the care of this patient.  Rosalin Hawking, MD PhD Stroke Neurology 11/23/2021 12:40 AM

## 2021-11-23 NOTE — Progress Notes (Addendum)
PROGRESS NOTE    Carlos Wise  JKD:[id number] DOB: [birth year] DOA: 11/22/2021 PCP: [name], MD   Brief Narrative:    Carlos Wise is a 81 y.o. male with medical history significant of alcohol use disorder, chronic hyponatremia, seizure disorder, hypertension, hyperlipidemia, prostate cancer status post radiation, prolonged QT interval, chronic HFpEF, peripheral neuropathy, GERD, depression presenting the ED via EMS evaluation of altered mental status.  Patient was admitted with acute metabolic encephalopathy.  Brain MRI thus far with no acute findings and EEG with some left cortical dysfunction noted.  He has been started on high-dose IV thiamine given his alcohol abuse history and neurology is following.  He has required placement of Foley catheter by urology on 6/11 given his urethral stricture.  Assessment & Plan:   Principal Problem:   Acute metabolic encephalopathy Active Problems:   Leukocytosis   Hyponatremia   Prolonged QT interval   Alcohol use   HTN (hypertension)   HLD (hyperlipidemia)   (HFpEF) heart failure with preserved ejection fraction (HCC)  Assessment and Plan:   Acute metabolic encephalopathy Patient is somnolent but arousable.  Very confused and disoriented.  His speech is incomprehensible and he is not following commands.  However, moving all extremities spontaneously, no focal weakness.  No obvious facial droop.  CT head negative for acute finding.  Ammonia level normal.?Wernicke's encephalopathy given chronic alcohol use.?Infectious etiology.  Chest x-ray not suggestive of pneumonia.  No fever or meningeal signs.  History of seizure disorder no antiepileptics listed in home medications. -IV thiamine 500 mg 3 times a day x2 days, then 250 mg daily x5 days -Brain MRI with and without contrast with no acute findings -EEG with some left cortical dysfunction noted and neurology following -Thiamine level pending -B12 level 919 -Blood ethanol level  pending -TSH 2.352 -UA negative for UTI -UDS negative -Blood culture x2 currently pending -Appreciate ongoing neurology recommendations   Alcohol use disorder -CIWA protocol; Ativan as needed -Thiamine, folate, multivitamin -No signs of withdrawal noted at this time   QT prolongation -Cardiac monitoring -Keep potassium >4 and magnesium >2 -Give potassium supplementation today and recheck electrolytes -Avoid QT prolonging drugs if possible -Repeat EKG in a.m.   Acute on chronic hyponatremia -Likely related to dehydration as well as chronic alcohol use -Normal saline ordered to continue for now as he still appears dry-16 hour limit -Serum osmolarity 271 -Urine sodium, osmolarity -TSH WNL -Cortisol pending   Emesis No significant elevation of LFTs. -CT ordered to rule out infectious process or bowel obstruction   Mild leukocytosis No fever.  Chest x-ray not suggestive of pneumonia. -UA pending -CT abdomen pelvis pending -Repeat CBC in a.m.   Chronic HFpEF No signs of volume overload. -Monitor volume status closely, currently appears dry   Hypertension Hyperlipidemia Peripheral neuropathy GERD Depression -Resume home p.o. meds when more awake and alert; continues to be altered for now and therefore this is being held    DVT prophylaxis: SCDs Code Status: Full Family Communication: Discussed with caregiver Carlos Wise on phone 6/11 Disposition Plan:  Status is: Inpatient Remains inpatient appropriate because: Continued need for IV medications and fluids.  Consultants:  Neurology Urology  Procedures:  Foley catheter placement with ureteroscopy and dilation 6/11  Antimicrobials:  None   Subjective: Patient seen and evaluated today and still appears very confused. He is arousable to voice.  Objective: Vitals:   11/23/21 0600 11/23/21 0613 11/23/21 0730 11/23/21 0840  BP: 123/78  (!) 153/76 137/75  Pulse:  83  90 76  Resp: 18  (!) 24   Temp:       TempSrc:      SpO2: 97% 97% 90%   Weight:      Height:        Intake/Output Summary (Last 24 hours) at December 19, 2021 0910 Last data filed at 2021-12-19 0249 Gross per 24 hour  Intake 1085.62 ml  Output --  Net 1085.62 ml   Filed Weights   11/22/21 1307  Weight: 86.2 kg    Examination:  General exam: Appears somnolent and confused; oral mucosa dry Respiratory system: Clear to auscultation. Respiratory effort normal. Cardiovascular system: S1 & S2 heard, RRR.  Gastrointestinal system: Abdomen is soft Central nervous system: Lethargic/confused Extremities: No edema Skin: No significant lesions noted Psychiatry: Flat affect. Foley with pink-tinged UO    Data Reviewed: I have personally reviewed following labs and imaging studies  CBC: Recent Labs  Lab 11/22/21 1336 11/22/21 1359 12-19-2021 0628  WBC 12.7*  --  13.5*  NEUTROABS 11.2*  --   --   HGB 13.2 13.3 12.2*  HCT 36.4* 39.0 34.9*  MCV 90.1  --  92.6  PLT 262  --  740   Basic Metabolic Panel: Recent Labs  Lab 11/22/21 1336 11/22/21 1359 2021-12-19 0148 12/19/2021 0628  NA 126* 123* 130* 129*  K 4.3 4.2 3.7 3.5  CL 91*  --  97* 99  CO2 22  --  21* 20*  GLUCOSE 125*  --  104* 102*  BUN 12  --  15 16  CREATININE 0.89  --  0.88 0.84  CALCIUM 9.5  --  9.3 8.6*  MG  --   --  2.0  --    GFR: Estimated Creatinine Clearance: 76.1 mL/min (by C-G formula based on SCr of 0.84 mg/dL). Liver Function Tests: Recent Labs  Lab 11/22/21 1336  AST 21  ALT 13  ALKPHOS 63  BILITOT 1.4*  PROT 7.1  ALBUMIN 4.2   No results for input(s): "LIPASE", "AMYLASE" in the last 168 hours. Recent Labs  Lab 11/22/21 1314  AMMONIA 19   Coagulation Profile: No results for input(s): "INR", "PROTIME" in the last 168 hours. Cardiac Enzymes: No results for input(s): "CKTOTAL", "CKMB", "CKMBINDEX", "TROPONINI" in the last 168 hours. BNP (last 3 results) No results for input(s): "PROBNP" in the last 8760 hours. HbA1C: No results  for input(s): "HGBA1C" in the last 72 hours. CBG: No results for input(s): "GLUCAP" in the last 168 hours. Lipid Profile: No results for input(s): "CHOL", "HDL", "LDLCALC", "TRIG", "CHOLHDL", "LDLDIRECT" in the last 72 hours. Thyroid Function Tests: Recent Labs    December 19, 2021 0148  TSH 2.352   Anemia Panel: Recent Labs    December 19, 2021 0148  VITAMINB12 919*   Sepsis Labs: No results for input(s): "PROCALCITON", "LATICACIDVEN" in the last 168 hours.  No results found for this or any previous visit (from the past 240 hour(s)).       Radiology Studies: EEG adult  Result Date: 19-Dec-2021 Lora Havens, MD     December 19, 2021  8:52 AM Patient Name: Carlos Wise MRN: [id number] Epilepsy Attending: Lora Havens Referring Physician/Provider: Rosalin Hawking, MD Date: 12/19/2021 Duration: 21.14 mins Patient history: 81 y.o. male with PMH of alcohol abuse, chronic hyponatremia, seizure disorder, hypertension, hyperlipidemia, prostate cancer status post radiation, CHF presented to ED for AMS with nause and vomiting and confusion and dysarthria. EEG to evaluate for seizure. Level of alertness: Awake, asleep AEDs during EEG study: None  Technical aspects: This EEG study was done with scalp electrodes positioned according to the 10-20 International system of electrode placement. Electrical activity was acquired at a sampling rate of '500Hz'$  and reviewed with a high frequency filter of '70Hz'$  and a low frequency filter of '1Hz'$ . EEG data were recorded continuously and digitally stored. Description: The posterior dominant rhythm consists of '8Hz'$  activity of moderate voltage (25-35 uV) seen predominantly in posterior head regions, symmetric and reactive to eye opening and eye closing. Sleep was characterized by vertex waves, sleep spindles (12 to 14 Hz), maximal frontocentral region. EEG showed continuous 3 to 6 Hz theta-delta slowing in left hemisphere, maximal frontotemporal region. Hyperventilation and photic stimulation  were not performed.   ABNORMALITY - Continuous slow, left hemisphere, maximal frontotemporal region IMPRESSION: This study is suggestive of cortical dysfunction arising from left hemisphere, maximal frontotemporal region likely secondary to underlying structural abnormality, post-ictal state. No seizures or epileptiform discharges were seen throughout the recording. Lora Havens   MR BRAIN WO CONTRAST  Result Date: 11/23/2021 CLINICAL DATA:  Delirium EXAM: MRI HEAD WITHOUT CONTRAST TECHNIQUE: Multiplanar, multiecho pulse sequences of the brain and surrounding structures were obtained without intravenous contrast. COMPARISON:  None Available. FINDINGS: The examination had to be terminated prior to completion due to patient altered mental status and inability to cooperate with the technologist. Diffusion-weighted imaging, sagittal T1 and axial T2-weighted images were obtained. There is no acute ischemia. No midline shift or other mass effect. There is generalized atrophy with findings of chronic microvascular ischemia. The midline structures are normal. The major flow voids are normal. IMPRESSION: 1. Truncated examination due to patient altered mental status and inability to cooperate with the technologist. 2. No acute ischemia. 3. Generalized atrophy and findings of chronic microvascular ischemia. Electronically Signed   By: Ulyses Jarred M.D.   On: 11/23/2021 03:50   CT ABDOMEN PELVIS WO CONTRAST  Result Date: 11/22/2021 CLINICAL DATA:  Nausea and vomiting. EXAM: CT ABDOMEN AND PELVIS WITHOUT CONTRAST TECHNIQUE: Multidetector CT imaging of the abdomen and pelvis was performed following the standard protocol without IV contrast. RADIATION DOSE REDUCTION: This exam was performed according to the departmental dose-optimization program which includes automated exposure control, adjustment of the mA and/or kV according to patient size and/or use of iterative reconstruction technique. COMPARISON:  None  Available. FINDINGS: Evaluation of this exam is limited in the absence of intravenous contrast. Lower chest: The visualized lung bases are clear. There is coronary vascular calcification. No intra-abdominal free air or free fluid. Hepatobiliary: No focal liver abnormality is seen. No gallstones, gallbladder wall thickening, or biliary dilatation. Pancreas: Unremarkable. No pancreatic ductal dilatation or surrounding inflammatory changes. Spleen: Normal in size without focal abnormality. Adrenals/Urinary Tract: The adrenal glands unremarkable. There are 2 left renal hypodense lesions with the larger measuring up to 3.5 cm in the upper pole. These lesions are suboptimally characterized on this noncontrast CT but demonstrate fluid attenuation most consistent with cysts. There is no hydronephrosis or nephrolithiasis on either side. The visualized ureters and urinary bladder appear unremarkable. Stomach/Bowel: Moderate stool throughout the colon. There is sigmoid diverticulosis without active inflammatory changes. There is no bowel obstruction or active inflammation. The appendix is normal. Vascular/Lymphatic: Advanced aortoiliac atherosclerotic disease. The aorta is ectatic measuring up to 2.8 cm. The IVC is unremarkable. No portal venous gas. There is no adenopathy. Reproductive: Multiple fiducial markers within the prostate gland. The seminal vesicles are symmetric. Other: None Musculoskeletal: Osteopenia with degenerative changes of the spine. No acute osseous  pathology. Old healed left posterior rib fractures. IMPRESSION: 1. No acute intra-abdominal or pelvic pathology. 2. Sigmoid diverticulosis. 3. Aortic Atherosclerosis (ICD10-I70.0). Electronically Signed   By: Anner Crete M.D.   On: 11/22/2021 23:27   CT Head Wo Contrast  Result Date: 11/22/2021 CLINICAL DATA:  Altered mental status. EXAM: CT HEAD WITHOUT CONTRAST TECHNIQUE: Contiguous axial images were obtained from the base of the skull through the  vertex without intravenous contrast. RADIATION DOSE REDUCTION: This exam was performed according to the departmental dose-optimization program which includes automated exposure control, adjustment of the mA and/or kV according to patient size and/or use of iterative reconstruction technique. COMPARISON:  August 31, 2020 FINDINGS: Brain: There is mild to moderate severity cerebral atrophy with widening of the extra-axial spaces and ventricular dilatation. There are areas of decreased attenuation within the white matter tracts of the supratentorial brain, consistent with microvascular disease changes. Vascular: No hyperdense vessel or unexpected calcification. A small metallic density foreign body is seen adjacent to the anteromedial aspect of the cavernous carotid artery on the right. Skull: Normal. Negative for fracture or focal lesion. Sinuses/Orbits: No acute finding. Other: None. IMPRESSION: 1. No acute intracranial abnormality. 2. Generalized cerebral atrophy and microvascular disease changes of the supratentorial brain. Electronically Signed   By: Virgina Norfolk M.D.   On: 11/22/2021 16:55   DG Chest Port 1 View  Result Date: 11/22/2021 CLINICAL DATA:  Altered mental status EXAM: PORTABLE CHEST 1 VIEW COMPARISON:  02/04/2020 FINDINGS: The heart size and mediastinal contours are within normal limits. Both lungs are clear. Chronic fracture deformities of the posterior left ribs. IMPRESSION: No acute abnormality of the lungs in AP portable projection. Electronically Signed   By: Delanna Ahmadi M.D.   On: 11/22/2021 13:55        Scheduled Meds:  folic acid  1 mg Oral Daily   multivitamin with minerals  1 tablet Oral Daily   Continuous Infusions:  sodium chloride Stopped (11/23/21 0145)   [START ON 11/24/2021] thiamine injection     thiamine injection Stopped (11/23/21 0249)     LOS: 1 day    Time spent: 35 minutes    Carmen Vallecillo D Manuella Ghazi, DO [facility]  If 7PM-7AM, please contact  night-coverage [email or web address] 11/23/2021, 9:10 AM

## 2021-11-23 NOTE — Progress Notes (Signed)
Neurology Progress Note  Patient ID: Carlos Wise is a 81 y.o. with PMHx of  has a past medical history of Herpes zoster, Hyperlipemia, Hypertension, Intestinal polyp, Prostate cancer ([place]), and S/P radiation therapy (12/12/2014 through 02/06/2015                                                   ).  Initially consulted for: AMS  Subjective: -Wants a phone to call someone to pick him up to go home -Was not aware that he had stopped taking levetiracetam (does not recognize brand-name Keppra, but does recognize levetiracetam, although he also confuses it with Lexapro at times).  However he does confirm that he thought he was on medication for seizures -Precontemplative about stopping drinking, reports he drinks only 3 ounces a day -Foley catheter placed due to urethral stricture  Exam: Vitals:   11/23/21 1415 11/23/21 2014  BP: (!) 150/95 (!) 163/89  Pulse: 85 100  Resp: 18 (!) 23  Temp: 97.8 F (36.6 C) (!) 97.3 F (36.3 C)  SpO2: 98% 94%   Gen: In bed, comfortable  Resp: non-labored breathing, no grossly audible wheezing Cardiac: Perfusing extremities well  Abd: soft, nt  Neuro: MS: Oriented to [facility] but not time or situation.  Able to name and repeat, able to follow simple commands CN: Pupils equal round and reactive, face symmetric, tongue midline.  Extremely hard of hearing, requires multiple repetitions of most phrases to hear what examiner is saying Motor: Uses bilateral upper extremities equally Sensory: Equally reactive to touch in bilateral upper extremities  Pertinent Labs:  Basic Metabolic Panel: Recent Labs  Lab 11/22/21 1336 11/22/21 1359 11/23/21 0148 11/23/21 0628  NA 126* 123* 130* 129*  K 4.3 4.2 3.7 3.5  CL 91*  --  97* 99  CO2 22  --  21* 20*  GLUCOSE 125*  --  104* 102*  BUN 12  --  15 16  CREATININE 0.89  --  0.88 0.84  CALCIUM 9.5  --  9.3 8.6*  MG  --   --  2.0  --     CBC: Recent Labs  Lab 11/22/21 1336 11/22/21 1359  11/23/21 0628  WBC 12.7*  --  13.5*  NEUTROABS 11.2*  --   --   HGB 13.2 13.3 12.2*  HCT 36.4* 39.0 34.9*  MCV 90.1  --  92.6  PLT 262  --  243   B12 919  Coagulation Studies: No results for input(s): "LABPROT", "INR" in the last 72 hours.   11/23/2020 EEG: This study is suggestive of cortical dysfunction arising from left hemisphere, maximal frontotemporal region likely secondary to underlying structural abnormality, post-ictal state. No seizures or epileptiform discharges were seen throughout the recording.  MRI brain motion limited but negative for acute infarct (generalized atrophy and chronic microvascular ischemia)  Impression: High concern for a breakthrough seizure in the setting of medication discontinuation and hyponatremia.  Favor his EEG findings to represent postictal state, especially given prior EEG with epileptogenic activity in the left frontal region as well.  While ultimately antiseizure medications cannot prevent alcohol related seizure activity, focality of his EEG findings are an indication for antiseizure medication; seizures related purely to alcohol intoxication/withdrawal would be expected to be generalized in nature without focal EEG findings.  Certainly there may also be an element  of alcohol-related cognitive impairment and thiamine deficiency (Wernicke's); completing a course of empiric supplementation may be of great benefit with minimal potential harm.  Suspect his sodium level is also related to his chronic alcohol use, and appreciate primary team's careful gradual normalization of his sodium  Recommendations: - Keppra 500 mg BID, continue on discharge (this is not significant because of prolonged QTc despite epic EMR warning) - Continue thiamine supplementation, primary team or PCP to follow-up final B1 level - Expect continued gradual improvement, though Wernicke's may not improve fully - Continue ethanol cessation counseling - Goal normonatremia at a  correction rate of 5 to 8 mEq per 24 hours - Continue management of other comorbidities that can contribute to encephalopathy such as urinary retention/stricture and alcohol withdrawal per urology and primary team - Neurology will be available on an as-needed basis, please reach out if any questions or concerns arise, including if the patient has neurological worsening or does not improve  Lesleigh Noe MD-PhD [facility] [phone]  Available 7 AM to 7 PM, outside these hours please contact Neurologist on call listed on AMION

## 2021-11-23 NOTE — Procedures (Addendum)
Foley Catheter Placement Note  Indications: 81 y.o. male admitted for altered mental status who has not voided today. Urologic history significant for prostate cancer s/p radiation and prior bulbar urethral stricture causing retention. He last underwent dilation in the OR in 2019 with Dr. Louis Meckel. At one point he was on CIC regimen but it appears that he stopped doing this over the last year. Further history could not be obtained from the patient given his current mental status.   Pre-operative Diagnosis: Urethral stricture   Post-operative Diagnosis: Urethral stricture   Surgeon: Aldine Contes, MD  Assistants: None  Procedures: bedside urethroscopy, urethral dilation, complicated catheter placement   Procedure Details  Patient was placed in the supine position, prepped with Betadine and draped in the usual sterile fashion.  We injected lidocaine jelly per urethra prior to the procedure.  The ED provider had already tried several I&O catherizations and a 16Fr and 18Fr coude catheter. I began by trying at [place] catheter which would not advance beyond the prostate. At this point I elected to proceed with cystoscopy given his history of urethral stricture. A 16Fr flexible cystoscope was obtained and passed per urethra there was what appeared to be a dense ~8Fr stricture in the bulbar urethra. I was unable to advance the scope beyond this point. Under direct vision I passed a glide wire through the scope through the stricture. The scope was backloaded off the wire. To confirm placement in the bladder a 5Fr open ended catheter was placed over the wire. Wire removal resulted in clear yellow urine through the 5 french catheter indicating appropriate location in the bladder. I then replaced the wire and removed the 5 french catheter. Over the wire I sequentially dilated from 8Fr to 16Fr. After passing the 8Fr dilator the patient began to void. I was unable to dilate any further than the 16Fr given how dense the  stricture was and the patient's agitation. The wire was replaced. I then converted a 78MV silicone catheter into a council catheter using a 16 gauge angiocatheter. I advanced this without difficulty over the wire into the bladder and wire was removed. There was no resistance. We achieved return of clear cherry colored urine and then proceeded to insert 10 mL of sterile water into the Foley balloon.  The catheter was attached to a drainage bag and secured with a StatLock.  Catheter irrigated without issue. On recheck ~20 min later urine had cleared to barely pink tinged.                Complications: Patient was very agitated throughout the procedure. Otherwise tolerated well.    Plan:   1.  Continue Foley catheter to drainage for at least 3 days given bedside urethral dilation today 2. Would not remove foley catheter until patient is back to his baseline mentation to give him the best chance to void spontaneously  2.  Will arrange for follow up with patient's normal urologist Dr. Louis Meckel to reassess symptoms and discuss recurrence of urethral stricture        Attending Attestation: Dr. Milford Cage was available. I reviewedhistory and physical examination of the patient and discussed the patients management with the resident.  I reviewed the resident's note and agree with the documented findings and plan of care.

## 2021-11-24 DIAGNOSIS — G9341 Metabolic encephalopathy: Secondary | ICD-10-CM | POA: Diagnosis not present

## 2021-11-24 LAB — BASIC METABOLIC PANEL
Anion gap: 9 (ref 5–15)
BUN: 16 mg/dL (ref 8–23)
CO2: 22 mmol/L (ref 22–32)
Calcium: 9.1 mg/dL (ref 8.9–10.3)
Chloride: 101 mmol/L (ref 98–111)
Creatinine, Ser: 0.96 mg/dL (ref 0.61–1.24)
GFR, Estimated: >60 mL/min (ref >60)
Glucose, Bld: 116 mg/dL — ABNORMAL HIGH (ref 70–99)
Potassium: 3.9 mmol/L (ref 3.5–5.1)
Sodium: 132 mmol/L — ABNORMAL LOW (ref 135–145)

## 2021-11-24 LAB — CBC
HCT: 36.4 % — ABNORMAL LOW (ref 39.0–52.0)
Hemoglobin: 12.9 g/dL — ABNORMAL LOW (ref 13.0–17.0)
MCH: 32.3 pg (ref 26.0–34.0)
MCHC: 35.4 g/dL (ref 30.0–36.0)
MCV: 91 fL (ref 80.0–100.0)
Platelets: 272 10*3/uL (ref 150–400)
RBC: 4 MIL/uL — ABNORMAL LOW (ref 4.22–5.81)
RDW: 12.4 % (ref 11.5–15.5)
WBC: 16.3 10*3/uL — ABNORMAL HIGH (ref 4.0–10.5)
nRBC: 0 % (ref 0.0–0.2)

## 2021-11-24 LAB — MAGNESIUM: Magnesium: 2.1 mg/dL (ref 1.7–2.4)

## 2021-11-24 MED ORDER — SODIUM CHLORIDE 0.9 % IV SOLN
INTRAVENOUS | Status: DC
Start: 2021-11-24 — End: 2021-11-25

## 2021-11-24 MED ORDER — SODIUM CHLORIDE 1 G PO TABS
1.0000 g | ORAL_TABLET | Freq: Every day | ORAL | Status: DC
  Administered 2021-11-24 – 2021-11-28 (×5): 1 g via ORAL
  Filled 2021-11-24 (×5): qty 1

## 2021-11-24 MED ORDER — CHLORHEXIDINE GLUCONATE CLOTH 2 % EX PADS
6.0000 | MEDICATED_PAD | Freq: Every day | CUTANEOUS | Status: DC
  Administered 2021-11-24 – 2021-11-28 (×5): 6 via TOPICAL

## 2021-11-24 MED ORDER — PANTOPRAZOLE SODIUM 40 MG PO TBEC
40.0000 mg | DELAYED_RELEASE_TABLET | Freq: Every day | ORAL | Status: DC
  Administered 2021-11-24 – 2021-11-28 (×5): 40 mg via ORAL
  Filled 2021-11-24 (×5): qty 1

## 2021-11-24 MED ORDER — NICOTINE 14 MG/24HR TD PT24
14.0000 mg | MEDICATED_PATCH | Freq: Every day | TRANSDERMAL | Status: DC
  Administered 2021-11-24 – 2021-11-28 (×5): 14 mg via TRANSDERMAL
  Filled 2021-11-24 (×5): qty 1

## 2021-11-24 NOTE — Progress Notes (Signed)
PROGRESS NOTE    Carlos Wise  ZWC:[id number] DOB: Jul 31, 1940 DOA: 11/22/2021 PCP: [name], MD   Brief Narrative:    Carlos Wise is a 81 y.o. male with medical history significant of alcohol use disorder, chronic hyponatremia, seizure disorder, hypertension, hyperlipidemia, prostate cancer status post radiation, prolonged QT interval, chronic HFpEF, peripheral neuropathy, GERD, depression presenting the ED via EMS evaluation of altered mental status.  Patient was admitted with acute metabolic encephalopathy.  Brain MRI thus far with no acute findings and EEG with some left cortical dysfunction noted.  He has been started on high-dose IV thiamine given his alcohol abuse history and neurology has seen and think he was having seizures as he had stopped his keppra.  He has required placement of Foley catheter by urology on 6/11 given his urethral stricture.    Assessment & Plan:   Principal Problem:   Acute metabolic encephalopathy Active Problems:   Leukocytosis   Hyponatremia   Prolonged QT interval   Alcohol use   HTN (hypertension)   HLD (hyperlipidemia)   (HFpEF) heart failure with preserved ejection fraction (HCC)  Assessment and Plan:   Acute metabolic encephalopathy/postictal due to uncontrolled seizures -Ammonia level normal.? -Wernicke's encephalopathy given chronic alcohol use. - Chest x-ray not suggestive of pneumonia.  No fever or meningeal signs.   -History of seizure disorder no antiepileptics listed on home medications. -IV thiamine 500 mg 3 times a day x2 days, then 250 mg daily x5 days -Brain MRI with and without contrast with no acute findings -EEG with some left cortical dysfunction noted -Thiamine level pending -B12 level 919 -Blood ethanol level not done -TSH 2.352 -UA negative for UTI -UDS negative -neurology consult (they signed off 6/11): Keppra 500 mg BID, continue on discharge (this is not significant because of prolonged QTc despite epic EMR  warning)   Alcohol use disorder -CIWA protocol; Ativan as needed -Thiamine, folate, multivitamin -No signs of withdrawal noted at this time   QT prolongation -Keep potassium >4 and magnesium >2 -Avoid QT prolonging drugs if possible -EKG pending   Acute on chronic hyponatremia -Likely related to dehydration as well as chronic alcohol use -Normal saline ordered to continue for now as he still appears dry -Serum osmolarity 271 -on salt tabs at home that were not restarted upon admission -TSH WNL -Cortisol pending   Emesis No significant elevation of LFTs. -resolved -CT abd normal   Mild leukocytosis No fever.  Chest x-ray not suggestive of pneumonia. -UA pending -Repeat CBC in a.m.   Chronic HFpEF No signs of volume overload. -Monitor volume status closely, currently appears dry   Hypertension Hyperlipidemia Peripheral neuropathy GERD Depression -Resume home p.o. meds when more awake and alert; continues to be altered for now and therefore this is being held    DVT prophylaxis: SCDs Code Status: Full Disposition Plan:  Status is: Inpatient Remains inpatient appropriate because: Continued need for IV medications and fluids.  Consultants:  Neurology Urology  Procedures:  Foley catheter placement with ureteroscopy and dilation 6/11     Subjective: Sleeping but will awaken and answer simple questions  Objective: Vitals:   11/24/21 0033 11/24/21 0145 11/24/21 0436 11/24/21 0729  BP: (!) 162/94 (!) 162/94 (!) 154/83 (!) 140/97  Pulse: 92 92 97 92  Resp:    20  Temp: 98.3 F (36.8 C)  99.2 F (37.3 C) 99 F (37.2 C)  TempSrc: Oral  Oral Oral  SpO2: 95%  96% 96%  Weight:  Height:        Intake/Output Summary (Last 24 hours) at 11/24/2021 0748 Last data filed at 11/24/2021 0558 Gross per 24 hour  Intake 200 ml  Output 800 ml  Net -600 ml   Filed Weights   11/22/21 1307  Weight: 86.2 kg    Examination:  General exam: will awaken, NAD;  oral mucosa dry Respiratory system: Clear to auscultation. Respiratory effort normal. Cardiovascular system: S1 & S2 heard, RRR.  Gastrointestinal system: Abdomen is soft Central nervous system: will answer simple questions Extremities: No edema     Data Reviewed: I have personally reviewed following labs and imaging studies  CBC: Recent Labs  Lab 11/22/21 1336 11/22/21 1359 11/23/21 0628 11/24/21 0352  WBC 12.7*  --  13.5* 16.3*  NEUTROABS 11.2*  --   --   --   HGB 13.2 13.3 12.2* 12.9*  HCT 36.4* 39.0 34.9* 36.4*  MCV 90.1  --  92.6 91.0  PLT 262  --  243 412   Basic Metabolic Panel: Recent Labs  Lab 11/22/21 1336 11/22/21 1359 11/23/21 0148 11/23/21 0628 11/24/21 0352  NA 126* 123* 130* 129* 132*  K 4.3 4.2 3.7 3.5 3.9  CL 91*  --  97* 99 101  CO2 22  --  21* 20* 22  GLUCOSE 125*  --  104* 102* 116*  BUN 12  --  '15 16 16  '$ CREATININE 0.89  --  0.88 0.84 0.96  CALCIUM 9.5  --  9.3 8.6* 9.1  MG  --   --  2.0  --  2.1   GFR: Estimated Creatinine Clearance: 66.6 mL/min (by C-G formula based on SCr of 0.96 mg/dL). Liver Function Tests: Recent Labs  Lab 11/22/21 1336  AST 21  ALT 13  ALKPHOS 63  BILITOT 1.4*  PROT 7.1  ALBUMIN 4.2   No results for input(s): "LIPASE", "AMYLASE" in the last 168 hours. Recent Labs  Lab 11/22/21 1314  AMMONIA 19   Coagulation Profile: No results for input(s): "INR", "PROTIME" in the last 168 hours. Cardiac Enzymes: No results for input(s): "CKTOTAL", "CKMB", "CKMBINDEX", "TROPONINI" in the last 168 hours. BNP (last 3 results) No results for input(s): "PROBNP" in the last 8760 hours. HbA1C: No results for input(s): "HGBA1C" in the last 72 hours. CBG: No results for input(s): "GLUCAP" in the last 168 hours. Lipid Profile: No results for input(s): "CHOL", "HDL", "LDLCALC", "TRIG", "CHOLHDL", "LDLDIRECT" in the last 72 hours. Thyroid Function Tests: Recent Labs    11/23/21 0148  TSH 2.352   Anemia Panel: Recent  Labs    11/23/21 0148  VITAMINB12 919*   Sepsis Labs: No results for input(s): "PROCALCITON", "LATICACIDVEN" in the last 168 hours.  Recent Results (from the past 240 hour(s))  Culture, blood (Routine X 2) w Reflex to ID Panel     Status: None (Preliminary result)   Collection Time: 11/23/21  1:47 AM   Specimen: BLOOD  Result Value Ref Range Status   Specimen Description BLOOD SITE NOT SPECIFIED  Final   Special Requests   Final    BOTTLES DRAWN AEROBIC AND ANAEROBIC Blood Culture adequate volume   Culture   Final    NO GROWTH < 24 HOURS Performed at [facility], [address], [place], [place] [address]    Report Status PENDING  Incomplete  Culture, blood (Routine X 2) w Reflex to ID Panel     Status: None (Preliminary result)   Collection Time: 11/23/21  1:48 AM  Specimen: BLOOD  Result Value Ref Range Status   Specimen Description BLOOD SITE NOT SPECIFIED  Final   Special Requests   Final    BOTTLES DRAWN AEROBIC AND ANAEROBIC Blood Culture adequate volume   Culture   Final    NO GROWTH < 24 HOURS Performed at [facility], [place] [address] [place],  [address]    Report Status PENDING  Incomplete         Radiology Studies: EEG adult  Result Date: 12/15/2021 Lora Havens, MD     December 15, 2021  8:52 AM Patient Name: Carlos Wise MRN: [id number] Epilepsy Attending: Lora Havens Referring Physician/Provider: Rosalin Hawking, MD Date: 2021/12/15 Duration: 21.14 mins Patient history: 81 y.o. male with PMH of alcohol abuse, chronic hyponatremia, seizure disorder, hypertension, hyperlipidemia, prostate cancer status post radiation, CHF presented to ED for AMS with nause and vomiting and confusion and dysarthria. EEG to evaluate for seizure. Level of alertness: Awake, asleep AEDs during EEG study: None Technical aspects: This EEG study was done with scalp electrodes positioned according to the 10-20 International system of electrode placement. Electrical  activity was acquired at a sampling rate of '500Hz'$  and reviewed with a high frequency filter of '70Hz'$  and a low frequency filter of '1Hz'$ . EEG data were recorded continuously and digitally stored. Description: The posterior dominant rhythm consists of '8Hz'$  activity of moderate voltage (25-35 uV) seen predominantly in posterior head regions, symmetric and reactive to eye opening and eye closing. Sleep was characterized by vertex waves, sleep spindles (12 to 14 Hz), maximal frontocentral region. EEG showed continuous 3 to 6 Hz theta-delta slowing in left hemisphere, maximal frontotemporal region. Hyperventilation and photic stimulation were not performed.   ABNORMALITY - Continuous slow, left hemisphere, maximal frontotemporal region IMPRESSION: This study is suggestive of cortical dysfunction arising from left hemisphere, maximal frontotemporal region likely secondary to underlying structural abnormality, post-ictal state. No seizures or epileptiform discharges were seen throughout the recording. Lora Havens   MR BRAIN WO CONTRAST  Result Date: Dec 15, 2021 CLINICAL DATA:  Delirium EXAM: MRI HEAD WITHOUT CONTRAST TECHNIQUE: Multiplanar, multiecho pulse sequences of the brain and surrounding structures were obtained without intravenous contrast. COMPARISON:  None Available. FINDINGS: The examination had to be terminated prior to completion due to patient altered mental status and inability to cooperate with the technologist. Diffusion-weighted imaging, sagittal T1 and axial T2-weighted images were obtained. There is no acute ischemia. No midline shift or other mass effect. There is generalized atrophy with findings of chronic microvascular ischemia. The midline structures are normal. The major flow voids are normal. IMPRESSION: 1. Truncated examination due to patient altered mental status and inability to cooperate with the technologist. 2. No acute ischemia. 3. Generalized atrophy and findings of chronic  microvascular ischemia. Electronically Signed   By: Ulyses Jarred M.D.   On: Dec 15, 2021 03:50   CT ABDOMEN PELVIS WO CONTRAST  Result Date: 11/22/2021 CLINICAL DATA:  Nausea and vomiting. EXAM: CT ABDOMEN AND PELVIS WITHOUT CONTRAST TECHNIQUE: Multidetector CT imaging of the abdomen and pelvis was performed following the standard protocol without IV contrast. RADIATION DOSE REDUCTION: This exam was performed according to the departmental dose-optimization program which includes automated exposure control, adjustment of the mA and/or kV according to patient size and/or use of iterative reconstruction technique. COMPARISON:  None Available. FINDINGS: Evaluation of this exam is limited in the absence of intravenous contrast. Lower chest: The visualized lung bases are clear. There is coronary vascular calcification. No intra-abdominal free air  or free fluid. Hepatobiliary: No focal liver abnormality is seen. No gallstones, gallbladder wall thickening, or biliary dilatation. Pancreas: Unremarkable. No pancreatic ductal dilatation or surrounding inflammatory changes. Spleen: Normal in size without focal abnormality. Adrenals/Urinary Tract: The adrenal glands unremarkable. There are 2 left renal hypodense lesions with the larger measuring up to 3.5 cm in the upper pole. These lesions are suboptimally characterized on this noncontrast CT but demonstrate fluid attenuation most consistent with cysts. There is no hydronephrosis or nephrolithiasis on either side. The visualized ureters and urinary bladder appear unremarkable. Stomach/Bowel: Moderate stool throughout the colon. There is sigmoid diverticulosis without active inflammatory changes. There is no bowel obstruction or active inflammation. The appendix is normal. Vascular/Lymphatic: Advanced aortoiliac atherosclerotic disease. The aorta is ectatic measuring up to 2.8 cm. The IVC is unremarkable. No portal venous gas. There is no adenopathy. Reproductive: Multiple  fiducial markers within the prostate gland. The seminal vesicles are symmetric. Other: None Musculoskeletal: Osteopenia with degenerative changes of the spine. No acute osseous pathology. Old healed left posterior rib fractures. IMPRESSION: 1. No acute intra-abdominal or pelvic pathology. 2. Sigmoid diverticulosis. 3. Aortic Atherosclerosis (ICD10-I70.0). Electronically Signed   By: Anner Crete M.D.   On: 11/22/2021 23:27   CT Head Wo Contrast  Result Date: 11/22/2021 CLINICAL DATA:  Altered mental status. EXAM: CT HEAD WITHOUT CONTRAST TECHNIQUE: Contiguous axial images were obtained from the base of the skull through the vertex without intravenous contrast. RADIATION DOSE REDUCTION: This exam was performed according to the departmental dose-optimization program which includes automated exposure control, adjustment of the mA and/or kV according to patient size and/or use of iterative reconstruction technique. COMPARISON:  August 31, 2020 FINDINGS: Brain: There is mild to moderate severity cerebral atrophy with widening of the extra-axial spaces and ventricular dilatation. There are areas of decreased attenuation within the white matter tracts of the supratentorial brain, consistent with microvascular disease changes. Vascular: No hyperdense vessel or unexpected calcification. A small metallic density foreign body is seen adjacent to the anteromedial aspect of the cavernous carotid artery on the right. Skull: Normal. Negative for fracture or focal lesion. Sinuses/Orbits: No acute finding. Other: None. IMPRESSION: 1. No acute intracranial abnormality. 2. Generalized cerebral atrophy and microvascular disease changes of the supratentorial brain. Electronically Signed   By: Virgina Norfolk M.D.   On: 11/22/2021 16:55   DG Chest Port 1 View  Result Date: 11/22/2021 CLINICAL DATA:  Altered mental status EXAM: PORTABLE CHEST 1 VIEW COMPARISON:  02/04/2020 FINDINGS: The heart size and mediastinal contours are  within normal limits. Both lungs are clear. Chronic fracture deformities of the posterior left ribs. IMPRESSION: No acute abnormality of the lungs in AP portable projection. Electronically Signed   By: Delanna Ahmadi M.D.   On: 11/22/2021 13:55        Scheduled Meds:  folic acid  1 mg Oral Daily   levETIRAcetam  500 mg Oral BID   multivitamin with minerals  1 tablet Oral Daily   pantoprazole  40 mg Oral QAC breakfast   sodium chloride  1 g Oral Daily   Continuous Infusions:  sodium chloride     thiamine injection     thiamine injection 500 mg (11/23/21 2322)     LOS: 2 days    Time spent: 35 minutes    Geradine Girt, DO [facility]  If 7PM-7AM, please contact night-coverage [email or web address] 11/24/2021, 7:48 AM

## 2021-11-24 NOTE — Evaluation (Signed)
Clinical/Bedside Swallow Evaluation Patient Details  Name: Carlos Wise MRN: [id number] Date of Birth: [birth year]  Today's Date: 11/24/2021 Time: SLP Start Time (ACUTE ONLY): 1148 SLP Stop Time (ACUTE ONLY): 1205 SLP Time Calculation (min) (ACUTE ONLY): 17 min  Past Medical History:  Past Medical History:  Diagnosis Date   Herpes zoster    Hyperlipemia    Hypertension    Intestinal polyp    hyperplastic   Prostate cancer ([place])    S/P radiation therapy 12/12/2014 through 02/06/2015                                                      Prostate 7800 cGy in 40 sessions, seminal vesicles 5600 cGy in 40 sessions                           Past Surgical History:  Past Surgical History:  Procedure Laterality Date   Carotid Artery Catherization     PROSTATE BIOPSY  06/27/2014   HPI:  Carlos Wise is a 81 y.o. male who presented the ED via EMS evaluation of altered mental status. MRI brain negative. EEG: cortical dysfunction arising from left hemisphere, maximal frontotemporal region likely secondary to underlying structural abnormality, post-ictal state. PMH: alcohol use disorder, chronic hyponatremia, seizure disorder, hypertension, hyperlipidemia, prostate cancer status post radiation, prolonged QT interval, chronic HFpEF, peripheral neuropathy, GERD, depression. Carlos Wise followed for swallowing in August, 2021; was on a regular texture diet and thin liquids at that time.    Assessment / Plan / Recommendation  Clinical Impression  Carlos Wise was seen for bedside swallow evaluation with his caregiver present. Carlos Wise's caregiver reported that the Carlos Wise often holds liquids and very rarely coughs when taking pills with thin liquids. However, she denied any other difficulty. Oral mechanism exam was limited due to Carlos Wise's difficulty following commands and lethargy; oral inspection revealed mildly reduced natural dentition. Carlos Wise was lethargic throughout the evaluation and the impact of this on his performance is considered. Carlos Wise's RN  attributed lethargy to his receipt of Ativan, but Carlos Wise's caregiver stated that Carlos Wise's lethargy preceded this. Carlos Wise demonstrated symptoms of oropharyngeal dysphagia characterized by oral holding, prolonged mastication, and signs of aspiration with thin liquids. An NPO status is recommended at this time with allowance of ice chips and meds crushed in puree if Carlos Wise is adequately alert. SLP will follow to assess improvement in swallow function as lethargy wanes. SLP Visit Diagnosis: Dysphagia, unspecified (R13.10)    Aspiration Risk  Mild aspiration risk    Diet Recommendation NPO;Ice chips PRN after oral care;NPO except meds   Medication Administration: Crushed with puree Postural Changes: Seated upright at 90 degrees    Other  Recommendations      Recommendations for follow up therapy are one component of a multi-disciplinary discharge planning process, led by the attending physician.  Recommendations may be updated based on patient status, additional functional criteria and insurance authorization.  Follow up Recommendations  (TBD)      Assistance Recommended at Discharge Frequent or constant Supervision/Assistance  Functional Status Assessment Patient has had a recent decline in their functional status and demonstrates the ability to make significant improvements in function in a reasonable and predictable amount of time.  Frequency and Duration min 2x/week  2 weeks       Prognosis Prognosis  for Safe Diet Advancement: Good Barriers to Reach Goals: Cognitive deficits;Severity of deficits      Swallow Study   General Date of Onset: 11/23/21 HPI: Carlos Wise is a 81 y.o. male who presented the ED via EMS evaluation of altered mental status. MRI brain negative. EEG: cortical dysfunction arising from left hemisphere, maximal frontotemporal region likely secondary to underlying structural abnormality, post-ictal state. PMH: alcohol use disorder, chronic hyponatremia, seizure disorder, hypertension,  hyperlipidemia, prostate cancer status post radiation, prolonged QT interval, chronic HFpEF, peripheral neuropathy, GERD, depression. Carlos Wise followed for swallowing in August, 2021; was on a regular texture diet and thin liquids at that time. Type of Study: Bedside Swallow Evaluation Previous Swallow Assessment: See HPI Diet Prior to this Study: Regular;Thin liquids Temperature Spikes Noted: No Respiratory Status: Room air History of Recent Intubation: No Behavior/Cognition: Lethargic/Drowsy;Confused;Requires cueing;Doesn't follow directions Oral Cavity Assessment: Dry Oral Care Completed by SLP: No Oral Cavity - Dentition: Adequate natural dentition;Missing dentition Vision: Functional for self-feeding Self-Feeding Abilities: Total assist Patient Positioning: Upright in bed;Postural control adequate for testing Baseline Vocal Quality: Normal Volitional Cough: Strong Volitional Swallow: Able to elicit    Oral/Motor/Sensory Function Overall Oral Motor/Sensory Function:  (UTA)   Ice Chips Ice chips: Within functional limits Presentation: Spoon   Thin Liquid Thin Liquid: Impaired Presentation: Cup;Straw Pharyngeal  Phase Impairments: Cough - Immediate    Nectar Thick Nectar Thick Liquid: Not tested   Honey Thick Honey Thick Liquid: Not tested   Puree Puree: Impaired Presentation: Spoon Oral Phase Impairments: Poor awareness of bolus Oral Phase Functional Implications: Oral holding   Solid     Solid: Impaired Oral Phase Impairments: Impaired mastication Oral Phase Functional Implications: Impaired mastication     Marylou Wages I. Hardin Negus, [place], [place] Office number [phone] Pager [phone]  Horton Marshall 11/24/2021,12:30 PM

## 2021-11-24 NOTE — TOC Initial Note (Signed)
Transition of Care [facility]) - Initial/Assessment Note    Patient Details  Name: Carlos Wise MRN: [id number] Date of Birth: [birth year]  [facility] CM/SW Contact:    Pollie Friar, RN Phone Number: 11/24/2021, 10:40 AM  Clinical Narrative:                 Patient is confused an in mits at time of CM visit. His caregiver is at the bedside and provides requested information. Per Caregiver pt doesn't have a health care POA. He does have 2 sisters as next of kin.  He lives home alone other than the caregiver that comes in daily to assist with home needs, meds and meals. Pt also have aide services up until June 1st.  His caregiver does the driving.  CM and caregiver discussed the potential need for adding back the aide services at d/c. Will need to be evaluated by therapies to see rehab needs. CM will update MD.  TOC following.  Expected Discharge Plan: Home/Self Care Barriers to Discharge: Continued Medical Work up   Patient Goals and CMS Choice        Expected Discharge Plan and Services Expected Discharge Plan: Home/Self Care   Discharge Planning Services: CM Consult   Living arrangements for the past 2 months: Single Family Home                                      Prior Living Arrangements/Services Living arrangements for the past 2 months: Single Family Home Lives with:: Self Patient language and need for interpreter reviewed:: Yes        Need for Family Participation in Patient Care: Yes (Comment) Care giver support system in place?: No (comment) Current home services: DME (walker/ cane/ shower seat) Criminal Activity/Legal Involvement Pertinent to Current Situation/Hospitalization: No - Comment as needed  Activities of Daily Living      Permission Sought/Granted                  Emotional Assessment Appearance:: Appears stated age         Psych Involvement: No (comment)  Admission diagnosis:  Urinary retention  [R33.9] Alcoholism ([place]) [F10.20] Hyponatremia [E87.1] Acute encephalopathy [G93.40] Altered mental status, unspecified altered mental status type [P53.61] Acute metabolic encephalopathy [W43.15] Patient Active Problem List   Diagnosis Date Noted   Acute metabolic encephalopathy 40/01/6760   HTN (hypertension) 11/22/2021   HLD (hyperlipidemia) 11/22/2021   (HFpEF) heart failure with preserved ejection fraction ([place]) 11/22/2021   Altered mental status 02/07/2020   Aphasia 02/04/2020   Leukocytosis 02/04/2020   Hyponatremia 02/04/2020   Prolonged QT interval 02/04/2020   Alcohol use 02/04/2020   Malignant neoplasm of prostate ([place]) 07/24/2014   PCP:  [name], MD Pharmacy:   [facility] [place] [address] [address] [place] [address] Phone: [phone] Fax: [phone]  [id number] [place] [id number]: [phone]: [phone]    Social Determinants of Health (SDOH) Interventions    Readmission Risk Interventions     No data to display

## 2021-11-25 DIAGNOSIS — G9341 Metabolic encephalopathy: Secondary | ICD-10-CM | POA: Diagnosis not present

## 2021-11-25 LAB — BASIC METABOLIC PANEL
Anion gap: 10 (ref 5–15)
BUN: 15 mg/dL (ref 8–23)
CO2: 24 mmol/L (ref 22–32)
Calcium: 9 mg/dL (ref 8.9–10.3)
Chloride: 99 mmol/L (ref 98–111)
Creatinine, Ser: 0.85 mg/dL (ref 0.61–1.24)
GFR, Estimated: >60 mL/min (ref >60)
Glucose, Bld: 87 mg/dL (ref 70–99)
Potassium: 3.7 mmol/L (ref 3.5–5.1)
Sodium: 133 mmol/L — ABNORMAL LOW (ref 135–145)

## 2021-11-25 LAB — CBC
HCT: 35 % — ABNORMAL LOW (ref 39.0–52.0)
Hemoglobin: 12.4 g/dL — ABNORMAL LOW (ref 13.0–17.0)
MCH: 32.9 pg (ref 26.0–34.0)
MCHC: 35.4 g/dL (ref 30.0–36.0)
MCV: 92.8 fL (ref 80.0–100.0)
Platelets: 249 10*3/uL (ref 150–400)
RBC: 3.77 MIL/uL — ABNORMAL LOW (ref 4.22–5.81)
RDW: 12.6 % (ref 11.5–15.5)
WBC: 11.1 10*3/uL — ABNORMAL HIGH (ref 4.0–10.5)
nRBC: 0 % (ref 0.0–0.2)

## 2021-11-25 MED ORDER — CEPHALEXIN 500 MG PO CAPS
500.0000 mg | ORAL_CAPSULE | Freq: Two times a day (BID) | ORAL | Status: AC
Start: 2021-11-25 — End: 2021-11-27
  Administered 2021-11-25 – 2021-11-27 (×6): 500 mg via ORAL
  Filled 2021-11-25 (×6): qty 1

## 2021-11-25 MED ORDER — CHLORHEXIDINE GLUCONATE 0.12 % MT SOLN
15.0000 mL | Freq: Two times a day (BID) | OROMUCOSAL | Status: DC
  Administered 2021-11-25 – 2021-11-28 (×7): 15 mL via OROMUCOSAL
  Filled 2021-11-25 (×7): qty 15

## 2021-11-25 MED ORDER — THIAMINE HCL 100 MG PO TABS
250.0000 mg | ORAL_TABLET | Freq: Every day | ORAL | Status: DC
  Administered 2021-11-25 – 2021-11-28 (×4): 250 mg via ORAL
  Filled 2021-11-25 (×4): qty 3

## 2021-11-25 NOTE — Care Management Important Message (Signed)
Important Message  Patient Details  Name: Carlos Wise MRN: [id number] Date of Birth: 1940/10/24   Medicare Important Message Given:  Yes     Orbie Pyo 11/25/2021, 2:41 PM

## 2021-11-25 NOTE — Progress Notes (Addendum)
PROGRESS NOTE   Carlos Wise  MHD:[id number] DOB: 1940-10-01 DOA: 11/22/2021 PCP: [name], MD  Brief Narrative:   81 year old white male HTN HLD HFpEF peripheral neuropathy on gabapentin Prostate cancer status post XRT with prior bulbar urethral stricture causing retention status post dilatation 2019 with Dr. Louis Meckel prolonged QTc  History of right ICA aneurysm status post coiling 02/09/2020 admit -seizures placed on Keppra at that time 2/2 EEG showing epileptogenicity left temporal region--Underlying?  Hyponatremia was placed on salt tablets which he is noncompliant compliant on Last EEG 08/12/2020 was normal with no epileptiform discharges and it appears he was lost to follow-up Known daily drinker  Presented to ED 6/10 inability to answer questions properly had vomited in the bed and was not making sense he was quite restless ill-appearing and inattentive as well as agitated and hyperactive  Sodium 126 chloride 91 BUN/creatinine 12/0.8 total bili 1.4 WBC 12.7 hemoglobin 13.2 platelet 262 Ammonia was normal Urine tox screen negative, blood alcohol was less than 10 Cortisol was never obtained  CT head generalized atrophy microvascular changes CXR no acute abnormality CT abdomen pelvis no acute intrapelvic pathology with diverticulosis aortic atherosclerosis-multiple markers within the prostate gland old healed left posterior rib fractures MRI brain truncated exam due to altered mental status generalized atrophy chronic changes  Foley could not be placed in the ED so urology was consulted and they placed a Foley and instructed not to remove catheter until back to baseline mentation  Hospital-Problem based course  Acute metabolic encephalopathy due to uncontrolled seizures Probable nidus was hyponatremia and alcoholism EEG this admission showed left cortical dysfunction--epileptologist neurology saw the patient recommended Keppra 500 twice daily [he wasn't taking the keppra  PTA] MRI brain no acute findings B12 wnl Because of encephalopathy started on IV B1 '@500'$  3 times daily- will switch to oral and continue on d/c Initially he was felt to be a poor candidate for diet and was quite encephalopathic because of Ativan but now he is improved and he has been graduated to regular diet Acute superimposed on chronic hyponatremia-sodium 126 on admission, osmolality 271, urine osmolality of 126 urine sodium 20-etiology secondary to salt wasting from volume depletion Replacing with salt tabs 1 g daily, will need to continue this Normal saline was discontinued as he is eating and drinking sodium is improving with replacement of sodium-Cortisol not checked--as improving , hold on checking TSH was within normal limits Acute alcoholic withdrawal on admission Alcohol protocol withdrawal discontinued on 6/13-monitor mentation-he is improving Need for coud catheter Leukocytosis etiology unclear Seems to be trending down-chest x-ray was negative for any pneumonia Because the patient was in retention with stricture, would cover with Keflex daily for 3 days and then discontinue Would not remove Foley catheter until closer to his baseline Resume Myrbetriq 25 daily if is able to remove catheter --risk of retention with this HFpEF Was a little drier earlier in hospital stay and receiving fluids-not on any prior to admission diuretics Neuropathy NOS Discontinue completely gabapentin 300 twice daily Depression Hold off on Lexapro 20 daily for now   DVT prophylaxis: SCD Code Status: Full Family Communication: called and d/w Carlos Wise [phone] Caregiver--gets 24/7 care for the last year.  Will get pt/ot to eval Disposition:  Status is: Inpatient Remains inpatient appropriate because:   IMproved   Consultants:  none  Procedures:   Antimicrobials: Keflex    Subjective:  Awake no distress  Seems a little slow to speak No pain fever no cough no  cold Coherent some but slow speech  Objective: Vitals:   11/24/21 1925 11/24/21 2016 11/24/21 2343 11/25/21 0341  BP: (!) 153/93 (!) 166/92 140/83 (!) 143/83  Pulse: 84 78 81 77  Resp: '18 17 17 16  '$ Temp: 98 F (36.7 C) 98 F (36.7 C) 97.7 F (36.5 C) 97.7 F (36.5 C)  TempSrc: Oral  Oral Oral  SpO2:  100% 99% 100%  Weight:      Height:        Intake/Output Summary (Last 24 hours) at 11/25/2021 0759 Last data filed at 11/25/2021 0532 Gross per 24 hour  Intake 1593.34 ml  Output 500 ml  Net 1093.34 ml   Filed Weights   11/22/21 1307  Weight: 86.2 kg    Examination:  Awake coherent slow to speak but answers correctly Cta b no added sound no rales rhonchi Abd soft nt nd no HSM RRR No le edema Moving legs   Data Reviewed: personally reviewed   CBC    Component Value Date/Time   WBC 11.1 (H) 11/25/2021 0253   RBC 3.77 (L) 11/25/2021 0253   HGB 12.4 (L) 11/25/2021 0253   HCT 35.0 (L) 11/25/2021 0253   PLT 249 11/25/2021 0253   MCV 92.8 11/25/2021 0253   MCH 32.9 11/25/2021 0253   MCHC 35.4 11/25/2021 0253   RDW 12.6 11/25/2021 0253   LYMPHSABS 0.8 11/22/2021 1336   MONOABS 0.6 11/22/2021 1336   EOSABS 0.0 11/22/2021 1336   BASOSABS 0.0 11/22/2021 1336      Latest Ref Rng & Units 11/25/2021    2:53 AM 11/24/2021    3:52 AM 11-28-2021    6:28 AM  CMP  Glucose 70 - 99 mg/dL 87  116  102   BUN 8 - 23 mg/dL '15  16  16   '$ Creatinine 0.61 - 1.24 mg/dL 0.85  0.96  0.84   Sodium 135 - 145 mmol/L 133  132  129   Potassium 3.5 - 5.1 mmol/L 3.7  3.9  3.5   Chloride 98 - 111 mmol/L 99  101  99   CO2 22 - 32 mmol/L '24  22  20   '$ Calcium 8.9 - 10.3 mg/dL 9.0  9.1  8.6      Radiology Studies: EEG adult  Result Date: 11/28/21 Lora Havens, MD     2021-11-28  8:52 AM Patient Name: Carlos Wise MRN: [id number] Epilepsy Attending: Lora Havens Referring Physician/Provider: Rosalin Hawking, MD Date: 28-Nov-2021 Duration: 21.14 mins Patient history: 81 y.o. male with  PMH of alcohol abuse, chronic hyponatremia, seizure disorder, hypertension, hyperlipidemia, prostate cancer status post radiation, CHF presented to ED for AMS with nause and vomiting and confusion and dysarthria. EEG to evaluate for seizure. Level of alertness: Awake, asleep AEDs during EEG study: None Technical aspects: This EEG study was done with scalp electrodes positioned according to the 10-20 International system of electrode placement. Electrical activity was acquired at a sampling rate of '500Hz'$  and reviewed with a high frequency filter of '70Hz'$  and a low frequency filter of '1Hz'$ . EEG data were recorded continuously and digitally stored. Description: The posterior dominant rhythm consists of '8Hz'$  activity of moderate voltage (25-35 uV) seen predominantly in posterior head regions, symmetric and reactive to eye opening and eye closing. Sleep was characterized by vertex waves, sleep spindles (12 to 14 Hz), maximal frontocentral region. EEG showed continuous 3 to 6 Hz theta-delta slowing in left hemisphere, maximal frontotemporal region. Hyperventilation and photic stimulation were not performed.  ABNORMALITY - Continuous slow, left hemisphere, maximal frontotemporal region IMPRESSION: This study is suggestive of cortical dysfunction arising from left hemisphere, maximal frontotemporal region likely secondary to underlying structural abnormality, post-ictal state. No seizures or epileptiform discharges were seen throughout the recording. Priyanka Barbra Sarks     Scheduled Meds:  cephALEXin  500 mg Oral Q12H   chlorhexidine  15 mL Mouth Rinse BID   Chlorhexidine Gluconate Cloth  6 each Topical Daily   folic acid  1 mg Oral Daily   levETIRAcetam  500 mg Oral BID   nicotine  14 mg Transdermal Daily   pantoprazole  40 mg Oral QAC breakfast   sodium chloride  1 g Oral Daily   thiamine  250 mg Oral Daily   Continuous Infusions:     LOS: 3 days   Time spent: [place], MD [facility] To contact the attending provider between 7A-7P or the covering provider during after hours 7P-7A, please log into the web site [email or web address] and access using universal [place] password for that web site. If you do not have the password, please call the hospital operator.  11/25/2021, 7:59 AM

## 2021-11-25 NOTE — Progress Notes (Addendum)
Speech Language Pathology Treatment: Dysphagia  Patient Details Name: Carlos Wise MRN: [id number] DOB: 1940-06-28 Today's Date: 11/25/2021 Time: 5035-4656 SLP Time Calculation (min) (ACUTE ONLY): 14 min  Assessment / Plan / Recommendation Clinical Impression  Pt seen for PO trials, demonstrating significantly improved alertness. Pt engaging in simple conversation with clinician, maintaining eye contact. He self-fed sips of thin liquids by straw, bites of puree and regular textures exhibiting mild oral holding. Suspect occasional delay in swallow initiation with sips of liquids, resulting in x1 overt cough and x1 mild throat clear across trials, but again these were rare instances. The more pt self-fed, swallowing became more automatic/swift and s/sx of aspiration were absent. Recommend regular, thin liquid diet with full supervision for meals. SLP to f/u for tolerance.    HPI HPI: Pt is a 81 y.o. male who presented the ED via EMS evaluation of altered mental status. MRI brain negative. EEG: cortical dysfunction arising from left hemisphere, maximal frontotemporal region likely secondary to underlying structural abnormality, post-ictal state. PMH: alcohol use disorder, chronic hyponatremia, seizure disorder, hypertension, hyperlipidemia, prostate cancer status post radiation, prolonged QT interval, chronic HFpEF, peripheral neuropathy, GERD, depression. Pt followed for swallowing in August, 2021; was on a regular texture diet and thin liquids at that time.      SLP Plan  Continue with current plan of care      Recommendations for follow up therapy are one component of a multi-disciplinary discharge planning process, led by the attending physician.  Recommendations may be updated based on patient status, additional functional criteria and insurance authorization.    Recommendations  Diet recommendations: Regular;Thin liquid Liquids provided via: Cup;Straw Medication Administration: Whole meds  with liquid (or puree) Supervision: Patient able to self feed;Full supervision/cueing for compensatory strategies Compensations: Minimize environmental distractions;Slow rate;Small sips/bites Postural Changes and/or Swallow Maneuvers: Seated upright 90 degrees                Oral Care Recommendations: Oral care BID Follow Up Recommendations: No SLP follow up Assistance recommended at discharge: Intermittent Supervision/Assistance SLP Visit Diagnosis: Dysphagia, unspecified (R13.10) Plan: Continue with current plan of care            Carlos Wise, [place], [place] Office Number: [phone]  Acie Fredrickson  11/25/2021, 10:57 AM

## 2021-11-25 NOTE — Progress Notes (Signed)
Physical Therapy Evaluation Patient Details Name: Carlos Wise MRN: [id number] DOB: [birth year] Today's Date: 11/25/2021  History of Present Illness  Pt is a 81 y/o M presenting to ED via EMS from home on 6/10 with AMS. CT negative, MRI negative, EEG showing cortical dysfunction arising from L frontotemporal region, post ictal state. PMH includes alcohol use disorder, chronic hyponatremia, seizure disorder, HTN, HLD, prostate cancer post radiation, chronic HFpEF,  peripheral neuropathy, GERD, and depression.  Clinical Impression  Pt was seen with 2 person assist for bed mob, transfers and gait for short distance mobility due to difficulty using LLE supportively for steps on RLE.  He is expecting to go to rehab but is a bit confused and giving conflicting information to therapists during eval.  Pt reports caregiver help but did walk with no AD and was home alone in overnight times.  Follow up with all elements of acute PT goals, esp focusing on LLE strengthening, balance in standing and progression of gait as tolerated with AD as needed, eventually working toward no AD as was PLOF.  Also will have to negotiate at least 3 steps for home to get from landing at basement to level home situation.     Recommendations for follow up therapy are one component of a multi-disciplinary discharge planning process, led by the attending physician.  Recommendations may be updated based on patient status, additional functional criteria and insurance authorization.  Follow Up Recommendations Acute inpatient rehab (3hours/day)    Assistance Recommended at Discharge Frequent or constant Supervision/Assistance  Patient can return home with the following  A lot of help with walking and/or transfers;A little help with bathing/dressing/bathroom;Assistance with cooking/housework;Direct supervision/assist for medications management;Direct supervision/assist for financial management;Assist for transportation;Help with stairs or  ramp for entrance    Equipment Recommendations Wheelchair (measurements PT);Wheelchair cushion (measurements PT)  Recommendations for Other Services  Rehab consult    Functional Status Assessment Patient has had a recent decline in their functional status and demonstrates the ability to make significant improvements in function in a reasonable and predictable amount of time.     Precautions / Restrictions Precautions Precautions: Fall Precaution Comments: foley cath, AMS Restrictions Weight Bearing Restrictions: No      Mobility  Bed Mobility Overal bed mobility: Needs Assistance Bed Mobility: Supine to Sit     Supine to sit: Min assist, +2 for physical assistance     General bed mobility comments: cues to scoot forward, R lateral lean    Transfers Overall transfer level: Needs assistance Equipment used: Rolling walker (2 wheels) Transfers: Sit to/from Stand, Bed to chair/wheelchair/BSC Sit to Stand: Mod assist, +2 physical assistance Stand pivot transfers: Mod assist, +2 physical assistance         General transfer comment: posterior lean, braces back of legs against bed for support    Ambulation/Gait Ambulation/Gait assistance: Mod assist, +2 physical assistance, +2 safety/equipment Gait Distance (Feet): 9 Feet (6+3) Assistive device: Rolling walker (2 wheels), 2 person hand held assist Gait Pattern/deviations: Step-to pattern, Step-through pattern, Decreased stride length, Decreased weight shift to left, Wide base of support Gait velocity: reduced, unsteady Gait velocity interpretation: <1.8 ft/sec, indicate of risk for recurrent falls Pre-gait activities: standing balance ck General Gait Details: pt is demonstrating a low tolerance for moving, with difficulty to WB on LLE and move RLE.  Has no clear stroke history but neuro changes are on MRI of encephalopathic changes.  Stairs            Emergency planning/management officer  Modified Rankin (Stroke Patients  Only)       Balance Overall balance assessment: Needs assistance Sitting-balance support: Feet supported, Bilateral upper extremity supported Sitting balance-Leahy Scale: Fair Sitting balance - Comments: R lateral lean in sitting, avoiding L hip in sitting     Standing balance-Leahy Scale: Poor Standing balance comment: pt cannot support on LLE to step on RLE initially                             Pertinent Vitals/Pain Pain Assessment Faces Pain Scale: Hurts a little bit Pain Location: back - itching Pain Descriptors / Indicators: Discomfort Pain Intervention(s): Other (comment) (applied cream to back)    Home Living Family/patient expects to be discharged to:: Private residence Living Arrangements: Alone Available Help at Discharge: Personal care attendant;Available PRN/intermittently Type of Home: House Home Access: Stairs to enter   [employer] of Steps: flight from garage, has chairlift   Home Layout: Multi-level Home Equipment: Shower seat;Other (comment) (chair lift  ) Additional Comments: pt is giving some conflicting information    Prior Function Prior Level of Function : Patient poor historian/Family not available;Needs assist             Mobility Comments: reports no AD use ADLs Comments: has caregiver that comes in AM and PM per pt, assists with ADLs/IADLs, however pt states he also completes ADLs independently at home     Hand Dominance   Dominant Hand: Right    Extremity/Trunk Assessment   Upper Extremity Assessment Upper Extremity Assessment: Defer to OT evaluation RUE Deficits / Details: 3+/5 MMT at R shoulder RUE Sensation: WNL RUE Coordination: decreased gross motor;decreased fine motor    Lower Extremity Assessment Lower Extremity Assessment: Generalized weakness;LLE deficits/detail LLE Deficits / Details: L hip 2-, knee 2+ flexion  and ankle 2-    Cervical / Trunk Assessment Cervical / Trunk Assessment: Normal   Communication   Communication: HOH (no hearing aid at hosp)  Cognition Arousal/Alertness: Awake/alert Behavior During Therapy: Impulsive Overall Cognitive Status: No family/caregiver present to determine baseline cognitive functioning                                 General Comments: pt requiring increased cuing for safety and tasks during session, possibly due to [place] comments (skin integrity, edema, etc.): pt is demonstrating irritation and mild skin breakdown on R side of low back    Exercises     Assessment/Plan    PT Assessment Patient needs continued PT services  PT Problem List Decreased strength;Decreased activity tolerance;Decreased balance;Decreased mobility;Decreased coordination;Decreased knowledge of use of DME;Decreased safety awareness       PT Treatment Interventions DME instruction;Gait training;Functional mobility training;Therapeutic activities;Therapeutic exercise;Balance training;Neuromuscular re-education;Patient/family education;Stair training    PT Goals (Current goals can be found in the Care Plan section)  Acute Rehab PT Goals Patient Stated Goal: none stated PT Goal Formulation: With patient Time For Goal Achievement: 12/09/21 Potential to Achieve Goals: Good    Frequency Min 3X/week     Co-evaluation PT/OT/SLP Co-Evaluation/Treatment: Yes Reason for Co-Treatment: Complexity of the patient's impairments (multi-system involvement);For patient/therapist safety PT goals addressed during session: Mobility/safety with mobility;Balance OT goals addressed during session: ADL's and self-care;Strengthening/ROM;Proper use of Adaptive equipment and DME       AM-PAC PT "6 Clicks" Mobility  Outcome Measure Help  needed turning from your back to your side while in a flat bed without using bedrails?: A Little Help needed moving from lying on your back to sitting on the side of a flat bed without using bedrails?:  A Lot Help needed moving to and from a bed to a chair (including a wheelchair)?: A Lot Help needed standing up from a chair using your arms (e.g., wheelchair or bedside chair)?: A Lot Help needed to walk in hospital room?: A Lot Help needed climbing 3-5 steps with a railing? : Total 6 Click Score: 12    End of Session Equipment Utilized During Treatment: Gait belt Activity Tolerance: Patient tolerated treatment well;Patient limited by fatigue Patient left: in bed;with call bell/phone within reach;with bed alarm set Nurse Communication: Mobility status PT Visit Diagnosis: Unsteadiness on feet (R26.81);Muscle weakness (generalized) (M62.81);Difficulty in walking, not elsewhere classified (R26.2)    Time: 1937-9024 PT Time Calculation (min) (ACUTE ONLY): 34 min   Charges:   PT Evaluation $PT Eval Moderate Complexity: 1 Mod         Ramond Dial 11/25/2021, 4:22 PM  Mee Hives, PT PhD Acute Rehab Dept. Number: [place]

## 2021-11-25 NOTE — Evaluation (Signed)
Occupational Therapy Evaluation Patient Details Name: Carlos Wise MRN: [id number] DOB: [birth year] Today's Date: 11/25/2021   History of Present Illness Pt is a 81 y/o M presenting to ED via EMS from home on 6/10 with AMS. CT negative, MRI negative, EEG showing cortical dysfunction arising from L frontotemporal region, post ictal state. PMH includes alcohol use disorder, chronic hyponatremia, seizure disorder, HTN, HLD, prostate cancer post radiation, chronic HFpEF,  peripheral neuropathy, GERD, and depression.   Clinical Impression   Pt reports at baseline he does not use AD, receives assistance from aide that comes 2x/day for IADLs, unclear if pt receives assistance for ADLs, reports aide assists, however later reports he is independent. Pt currently min-modA +2 for ADLs, min A+2 for bed mobility, and mod A+2 for transfer to chair. Pt HOH, requiring increased cuing, noted mild RUE weakness with shoulder MMT along with R lateral lean in sitting. Pt presenting with impairments listed below, will follow acutely. Recommend AIR at d/c.     Recommendations for follow up therapy are one component of a multi-disciplinary discharge planning process, led by the attending physician.  Recommendations may be updated based on patient status, additional functional criteria and insurance authorization.   Follow Up Recommendations  Acute inpatient rehab (3hours/day)    Assistance Recommended at Discharge Intermittent Supervision/Assistance  Patient can return home with the following A lot of help with walking and/or transfers;A lot of help with bathing/dressing/bathroom;Assistance with cooking/housework;Assist for transportation;Help with stairs or ramp for entrance;Direct supervision/assist for medications management;Direct supervision/assist for financial management    Functional Status Assessment  Patient has had a recent decline in their functional status and demonstrates the ability to make significant  improvements in function in a reasonable and predictable amount of time.  Equipment Recommendations  Other (comment) (RW)    Recommendations for Other Services Rehab consult;PT consult     Precautions / Restrictions Precautions Precautions: Fall Precaution Comments: foley cath, AMS Restrictions Weight Bearing Restrictions: No      Mobility Bed Mobility Overal bed mobility: Needs Assistance Bed Mobility: Supine to Sit     Supine to sit: Min assist, +2 for physical assistance     General bed mobility comments: cues to scoot forward, R lateral lean    Transfers Overall transfer level: Needs assistance Equipment used: Rolling walker (2 wheels) Transfers: Sit to/from Stand, Bed to chair/wheelchair/BSC Sit to Stand: Mod assist, +2 physical assistance Stand pivot transfers: Mod assist, +2 physical assistance         General transfer comment: posterior lean, braces back of legs against bed for support      Balance Overall balance assessment: Needs assistance Sitting-balance support: Feet supported, Bilateral upper extremity supported Sitting balance-Leahy Scale: Fair Sitting balance - Comments: R lateral lean in sitting                                   ADL either performed or assessed with clinical judgement   ADL Overall ADL's : Needs assistance/impaired Eating/Feeding: Set up;Sitting   Grooming: Set up;Sitting   Upper Body Bathing: Minimal assistance;Sitting   Lower Body Bathing: Moderate assistance;Sitting/lateral leans   Upper Body Dressing : Minimal assistance;Sitting   Lower Body Dressing: Minimal assistance;Sitting/lateral leans Lower Body Dressing Details (indicate cue type and reason): to don/doff socks Toilet Transfer: Moderate assistance;+2 for physical assistance;Rolling walker (2 wheels);Stand-pivot;BSC/3in1 Armed forces technical officer Details (indicate cue type and reason): simulated to chair Toileting- Clothing Manipulation and  Hygiene: Total  assistance Toileting - Clothing Manipulation Details (indicate cue type and reason): catheter     Functional mobility during ADLs: Moderate assistance;Rolling walker (2 wheels);+2 for physical assistance       Vision Baseline Vision/History: 1 Wears glasses Ability to See in Adequate Light: 0 Adequate Additional Comments: will further assess, pt does not have glasses in room     Perception     Praxis      Pertinent Vitals/Pain Pain Assessment Pain Assessment: Faces Pain Score: 3  Faces Pain Scale: Hurts a little bit Pain Location: back - itching Pain Descriptors / Indicators: Discomfort Pain Intervention(s): Limited activity within patient's tolerance, Monitored during session     Hand Dominance Right   Extremity/Trunk Assessment Upper Extremity Assessment Upper Extremity Assessment: RUE deficits/detail RUE Deficits / Details: 3+/5 MMT at R shoulder RUE Sensation: WNL RUE Coordination: decreased gross motor;decreased fine motor   Lower Extremity Assessment Lower Extremity Assessment: Defer to PT evaluation   Cervical / Trunk Assessment Cervical / Trunk Assessment: Normal   Communication Communication Communication: HOH (no hearing aid present in room  )   Cognition Arousal/Alertness: Awake/alert Behavior During Therapy: Impulsive Overall Cognitive Status: No family/caregiver present to determine baseline cognitive functioning                                 General Comments: pt requiring increased cuing for safety and tasks during session, possibly due to [facility]     General Comments  VSS On RA    Exercises     Shoulder Instructions      Home Living Family/patient expects to be discharged to:: Private residence Living Arrangements: Alone Available Help at Discharge: Personal care attendant;Available PRN/intermittently Type of Home: House Home Access: Stairs to enter [employer] of Steps: flight from garage, has chairlift   Home  Layout: Multi-level     Bathroom Shower/Tub: Tub/shower unit (states he is getting a remodel to have walk in shower)   Biochemist, clinical: Standard     Home Equipment: Shower seat;Other (comment) (chair lift  )   Additional Comments: pt is giving some conflicting information      Prior Functioning/Environment Prior Level of Function : Patient poor historian/Family not available;Needs assist             Mobility Comments: reports no AD use ADLs Comments: has caregiver that comes in AM and PM per pt, assists with ADLs/IADLs, however pt states he also completes ADLs independently at home        OT Problem List: Decreased strength;Decreased range of motion;Decreased activity tolerance;Impaired balance (sitting and/or standing);Decreased coordination;Decreased cognition;Decreased safety awareness      OT Treatment/Interventions: Self-care/ADL training;Therapeutic exercise;DME and/or AE instruction;Energy conservation;Therapeutic activities;Patient/family education;Balance training;Cognitive remediation/compensation;Visual/perceptual remediation/compensation    OT Goals(Current goals can be found in the care plan section) Acute Rehab OT Goals Patient Stated Goal: to go home OT Goal Formulation: With patient Time For Goal Achievement: 12/09/21 Potential to Achieve Goals: Good ADL Goals Pt Will Perform Upper Body Dressing: with supervision;sitting;standing Pt Will Perform Lower Body Dressing: with supervision;sitting/lateral leans;sit to/from stand Pt Will Transfer to Toilet: with min guard assist;ambulating;regular height toilet Pt Will Perform Tub/Shower Transfer: Tub transfer;Shower transfer;with min guard assist;rolling walker;shower seat;ambulating Additional ADL Goal #1: Pt will accurately complete 3 step trailmaking task in prep for ADLs  OT Frequency: Min 2X/week    Co-evaluation PT/OT/SLP Co-Evaluation/Treatment: Yes Reason for Co-Treatment: Complexity of the patient's  impairments (multi-system involvement);To address functional/ADL transfers;For patient/therapist safety   OT goals addressed during session: ADL's and self-care;Strengthening/ROM;Proper use of Adaptive equipment and DME      AM-PAC OT "6 Clicks" Daily Activity     Outcome Measure Help from another person eating meals?: None Help from another person taking care of personal grooming?: A Little Help from another person toileting, which includes using toliet, bedpan, or urinal?: A Lot Help from another person bathing (including washing, rinsing, drying)?: A Lot Help from another person to put on and taking off regular upper body clothing?: A Little Help from another person to put on and taking off regular lower body clothing?: A Little 6 Click Score: 17   End of Session Equipment Utilized During Treatment: Gait belt;Rolling walker (2 wheels) Nurse Communication: Mobility status  Activity Tolerance: Patient tolerated treatment well Patient left: in chair;with call bell/phone within reach;with chair alarm set  OT Visit Diagnosis: Unsteadiness on feet (R26.81);Other abnormalities of gait and mobility (R26.89);Muscle weakness (generalized) (M62.81)                Time: 4401-0272 OT Time Calculation (min): 34 min Charges:  OT General Charges $OT Visit: 1 Visit OT Evaluation $OT Eval Moderate Complexity: [address] OTD, OTR/L Acute Rehab [phone] [place] 11/25/2021, 1:30 PM

## 2021-11-26 DIAGNOSIS — G9341 Metabolic encephalopathy: Secondary | ICD-10-CM | POA: Diagnosis not present

## 2021-11-26 LAB — BASIC METABOLIC PANEL
Anion gap: 8 (ref 5–15)
BUN: 13 mg/dL (ref 8–23)
CO2: 26 mmol/L (ref 22–32)
Calcium: 8.9 mg/dL (ref 8.9–10.3)
Chloride: 97 mmol/L — ABNORMAL LOW (ref 98–111)
Creatinine, Ser: 0.87 mg/dL (ref 0.61–1.24)
GFR, Estimated: >60 mL/min (ref >60)
Glucose, Bld: 109 mg/dL — ABNORMAL HIGH (ref 70–99)
Potassium: 3.5 mmol/L (ref 3.5–5.1)
Sodium: 131 mmol/L — ABNORMAL LOW (ref 135–145)

## 2021-11-26 LAB — CBC WITH DIFFERENTIAL/PLATELET
Abs Immature Granulocytes: 0.05 10*3/uL (ref 0.00–0.07)
Basophils Absolute: 0.1 10*3/uL (ref 0.0–0.1)
Basophils Relative: 1 %
Eosinophils Absolute: 0.7 10*3/uL — ABNORMAL HIGH (ref 0.0–0.5)
Eosinophils Relative: 7 %
HCT: 35.4 % — ABNORMAL LOW (ref 39.0–52.0)
Hemoglobin: 12.5 g/dL — ABNORMAL LOW (ref 13.0–17.0)
Immature Granulocytes: 1 %
Lymphocytes Relative: 15 %
Lymphs Abs: 1.6 10*3/uL (ref 0.7–4.0)
MCH: 32.7 pg (ref 26.0–34.0)
MCHC: 35.3 g/dL (ref 30.0–36.0)
MCV: 92.7 fL (ref 80.0–100.0)
Monocytes Absolute: 1 10*3/uL (ref 0.1–1.0)
Monocytes Relative: 9 %
Neutro Abs: 7.2 10*3/uL (ref 1.7–7.7)
Neutrophils Relative %: 67 %
Platelets: 280 10*3/uL (ref 150–400)
RBC: 3.82 MIL/uL — ABNORMAL LOW (ref 4.22–5.81)
RDW: 12.5 % (ref 11.5–15.5)
WBC: 10.7 10*3/uL — ABNORMAL HIGH (ref 4.0–10.5)
nRBC: 0 % (ref 0.0–0.2)

## 2021-11-26 MED ORDER — POLYVINYL ALCOHOL 1.4 % OP SOLN
1.0000 [drp] | OPHTHALMIC | Status: DC | PRN
  Administered 2021-11-26 – 2021-11-27 (×3): 1 [drp] via OPHTHALMIC
  Filled 2021-11-26: qty 15

## 2021-11-26 NOTE — NC FL2 (Signed)
[place] LEVEL OF CARE SCREENING TOOL     IDENTIFICATION  Patient Name: Carlos Wise Birthdate: 1941/06/06 Sex: male Admission Date (Current Location): 11/22/2021  [facility] and [place] Number:  Herbalist and Address:  [facility] [place], [place] [address], [place], [place] [address]      Provider Number: [id number]  Attending Physician Name and Address:  Leslee Home, DO  Relative Name and Phone Number:       Current Level of Care: Hospital Recommended Level of Care: [place] Prior Approval Number:    Date Approved/Denied:   PASRR Number: [id number]  Discharge Plan: SNF    Current Diagnoses: Patient Active Problem List   Diagnosis Date Noted   Acute metabolic encephalopathy 32/44/0102   HTN (hypertension) 11/22/2021   HLD (hyperlipidemia) 11/22/2021   (HFpEF) heart failure with preserved ejection fraction ([place]) 11/22/2021   Altered mental status 02/07/2020   Aphasia 02/04/2020   Leukocytosis 02/04/2020   Hyponatremia 02/04/2020   Prolonged QT interval 02/04/2020   Alcohol use 02/04/2020   Malignant neoplasm of prostate ([place]) 07/24/2014    Orientation RESPIRATION BLADDER Height & Weight     Self  Normal Incontinent Weight: 190 lb (86.2 kg) Height:  '5\' 8"'$  (172.7 cm)  BEHAVIORAL SYMPTOMS/MOOD NEUROLOGICAL BOWEL NUTRITION STATUS      Incontinent Diet (regular)  AMBULATORY STATUS COMMUNICATION OF NEEDS Skin   Extensive Assist Verbally Normal                       Personal Care Assistance Level of Assistance  Bathing, Feeding, Dressing Bathing Assistance: Limited assistance Feeding assistance: Limited assistance Dressing Assistance: Limited assistance     Functional Limitations Info  Hearing, Sight Sight Info: Impaired Hearing Info: Impaired (hearing aids)      [place]  PT (By licensed PT), OT (By licensed OT)     PT Frequency: 5x/wk OT Frequency: 5x/wk             Contractures Contractures Info: Not present    Additional Factors Info  Code Status, Allergies Code Status Info: Full Allergies Info: Penicillins           Current Medications (11/26/2021):  This is the current hospital active medication list Current Facility-Administered Medications  Medication Dose Route Frequency Provider Last Rate Last Admin   acetaminophen (TYLENOL) tablet 650 mg  650 mg Oral Q6H PRN Shela Leff, MD   650 mg at 11/26/21 7253   Or   acetaminophen (TYLENOL) suppository 650 mg  650 mg Rectal Q6H PRN Shela Leff, MD       cephALEXin (KEFLEX) capsule 500 mg  500 mg Oral Q12H Samtani, Jai-Gurmukh, MD   500 mg at 11/26/21 1111   chlorhexidine (PERIDEX) 0.12 % solution 15 mL  15 mL Mouth Rinse BID Vann, Jessica U, DO   15 mL at 11/26/21 1112   Chlorhexidine Gluconate Cloth 2 % PADS 6 each  6 each Topical Daily Eulogio Bear U, DO   6 each at 66/44/03 4742   folic acid (FOLVITE) tablet 1 mg  1 mg Oral Daily Shela Leff, MD   1 mg at 11/26/21 1111   levETIRAcetam (KEPPRA) tablet 500 mg  500 mg Oral BID Bhagat, Srishti L, MD   500 mg at 11/26/21 1111   nicotine (NICODERM CQ - dosed in mg/24 hours) patch 14 mg  14 mg Transdermal Daily Vann, Jessica U, DO   14 mg  at 11/26/21 1112   pantoprazole (PROTONIX) EC tablet 40 mg  40 mg Oral QAC breakfast Eulogio Bear U, DO   40 mg at 11/26/21 4193   polyvinyl alcohol (LIQUIFILM TEARS) 1.4 % ophthalmic solution 1 drop  1 drop Right Eye PRN Nita Sells, MD   1 drop at 11/26/21 1540   sodium chloride tablet 1 g  1 g Oral Daily Vann, Jessica U, DO   1 g at 11/26/21 1109   thiamine tablet 250 mg  250 mg Oral Daily Nita Sells, MD   250 mg at 11/26/21 1109     Discharge Medications: Please see discharge summary for a list of discharge medications.  Relevant Imaging Results:  Relevant Lab Results:   Additional Information SS#: [id number]  Geralynn Ochs, LCSW

## 2021-11-26 NOTE — Plan of Care (Signed)
  Problem: Education: Goal: Knowledge of General Education information will improve Description: Including pain rating scale, medication(s)/side effects and non-pharmacologic comfort measures Outcome: Progressing   Problem: Clinical Measurements: Goal: Will remain free from infection Outcome: Progressing Goal: Respiratory complications will improve Outcome: Progressing Goal: Cardiovascular complication will be avoided Outcome: Progressing   Problem: Activity: Goal: Risk for activity intolerance will decrease Outcome: Progressing   Problem: Nutrition: Goal: Adequate nutrition will be maintained Outcome: Progressing   Problem: Pain Managment: Goal: General experience of comfort will improve Outcome: Progressing   Problem: Safety: Goal: Ability to remain free from injury will improve Outcome: Progressing   Problem: Skin Integrity: Goal: Risk for impaired skin integrity will decrease Outcome: Progressing

## 2021-11-26 NOTE — Progress Notes (Signed)
Inpatient Rehab Admissions Coordinator:   Per PT/OT recommendations, pt was screened for CIR candidacy by Shann Medal, PT, DPT.  Unfortunately, [employer] Medicare unlikely to approve CIR for this primary diagnosis of encephalopathy due to seizures.  Will likely need f/u in an alternative setting per therapy discretion.  No consult recommended at this time.   Shann Medal, PT, DPT Admissions Coordinator [phone] 11/26/21  12:58 PM

## 2021-11-26 NOTE — Progress Notes (Signed)
PROGRESS NOTE   Carlos Wise  NFA:[id number] DOB: 05/16/1941 DOA: 11/22/2021 PCP: [name], MD  Brief Narrative:   81 year old white male HTN HLD HFpEF peripheral neuropathy on gabapentin Prostate cancer status post XRT with prior bulbar urethral stricture causing retention status post dilatation 2019 with Dr. Louis Meckel prolonged QTc  History of right ICA aneurysm status post coiling 02/09/2020 admit -seizures placed on Keppra at that time 2/2 EEG showing epileptogenicity left temporal region--Underlying?  Hyponatremia was placed on salt tablets which he is noncompliant compliant on Last EEG 08/12/2020 was normal with no epileptiform discharges and it appears he was lost to follow-up Known daily drinker  Presented to ED 6/10 inability to answer questions properly had vomited in the bed and was not making sense he was quite restless ill-appearing and inattentive as well as agitated and hyperactive  Sodium 126 chloride 91 BUN/creatinine 12/0.8 total bili 1.4 WBC 12.7 hemoglobin 13.2 platelet 262 Ammonia was normal Urine tox screen negative, blood alcohol was less than 10 Cortisol was never obtained  CT head generalized atrophy microvascular changes CXR no acute abnormality CT abdomen pelvis no acute intrapelvic pathology with diverticulosis aortic atherosclerosis-multiple markers within the prostate gland old healed left posterior rib fractures MRI brain truncated exam due to altered mental status generalized atrophy chronic changes  Foley could not be placed in the ED so urology was consulted and they placed a Foley and instructed not to remove catheter until back to baseline mentation  Hospital-Problem based course  Acute metabolic encephalopathy due to uncontrolled seizures Probable nidus was hyponatremia and alcoholism EEG this admission showed left cortical dysfunction--epileptologist neurology saw the patient recommended Keppra 500 twice daily [he wasn't taking the keppra  PTA] MRI brain no acute findings B12 wnl Because of encephalopathy started on IV B1 '@500'$  3 times daily- will switch to oral and continue on d/c Initially he was felt to be a poor candidate for diet and was quite encephalopathic because of Ativan but now he is improved and he has been graduated to regular diet Acute superimposed on chronic hyponatremia-sodium 126 on admission, osmolality 271, urine osmolality of 126 urine sodium 20-etiology secondary to salt wasting from volume depletion Replacing with salt tabs 1 g daily, will need to continue this Normal saline was discontinued as he is eating and drinking sodium is improving with replacement of sodium TSH was within normal limits Acute alcoholic withdrawal on admission Alcohol protocol withdrawal discontinued on 6/13-monitor mentation-he is improving Need for coud catheter Leukocytosis etiology unclear Seems to be trending down-chest x-ray was negative for any pneumonia Because the patient was in retention with stricture, would cover with Keflex daily for 3 days and then discontinue Would not remove Foley catheter until closer to his baseline Resume Myrbetriq 25 daily if is able to remove catheter --risk of retention with this HFpEF Was a little drier earlier in hospital stay and receiving fluids-not on any prior to admission diuretics Neuropathy NOS Discontinued completely gabapentin 300 twice daily Depression Hold off on Lexapro 20 daily for now   DVT prophylaxis: SCD Code Status: Full Family Communication: no family present at bedside Disposition:  Status is: Inpatient Remains inpatient appropriate because: awaiting placment   Consultants:  none  Procedures: none  Antimicrobials: Keflex    Subjective:  Awake no distress  Slow to speak No pain fever no cough no cold Coherent some but slow speech  Objective: Vitals:   11/26/21 0500 11/26/21 0502 11/26/21 0633 11/26/21 1241  BP: (!) 180/99 (!) 173/101 Marland Kitchen)  160/90  126/60  Pulse: 71 74  85  Resp:    20  Temp: 97.8 F (36.6 C)   97.6 F (36.4 C)  TempSrc: Oral   Oral  SpO2:    97%  Weight:      Height:        Intake/Output Summary (Last 24 hours) at 11/26/2021 1448 Last data filed at 11/26/2021 0855 Gross per 24 hour  Intake 240 ml  Output 600 ml  Net -360 ml    Filed Weights   11/22/21 1307  Weight: 86.2 kg    Examination:  Awake coherent slow to speak but answers correctly oriented to name only Cta b no added sound no rales rhonchi Abd soft nt nd no HSM RRR No le edema Moving legs   Data Reviewed: personally reviewed   CBC    Component Value Date/Time   WBC 10.7 (H) 11/26/2021 0144   RBC 3.82 (L) 11/26/2021 0144   HGB 12.5 (L) 11/26/2021 0144   HCT 35.4 (L) 11/26/2021 0144   PLT 280 11/26/2021 0144   MCV 92.7 11/26/2021 0144   MCH 32.7 11/26/2021 0144   MCHC 35.3 11/26/2021 0144   RDW 12.5 11/26/2021 0144   LYMPHSABS 1.6 11/26/2021 0144   MONOABS 1.0 11/26/2021 0144   EOSABS 0.7 (H) 11/26/2021 0144   BASOSABS 0.1 11/26/2021 0144      Latest Ref Rng & Units 11/26/2021    1:44 AM 11/25/2021    2:53 AM 11/24/2021    3:52 AM  CMP  Glucose 70 - 99 mg/dL 109  87  116   BUN 8 - 23 mg/dL '13  15  16   '$ Creatinine 0.61 - 1.24 mg/dL 0.87  0.85  0.96   Sodium 135 - 145 mmol/L 131  133  132   Potassium 3.5 - 5.1 mmol/L 3.5  3.7  3.9   Chloride 98 - 111 mmol/L 97  99  101   CO2 22 - 32 mmol/L '26  24  22   '$ Calcium 8.9 - 10.3 mg/dL 8.9  9.0  9.1      Radiology Studies: No results found.   Scheduled Meds:  cephALEXin  500 mg Oral Q12H   chlorhexidine  15 mL Mouth Rinse BID   Chlorhexidine Gluconate Cloth  6 each Topical Daily   folic acid  1 mg Oral Daily   levETIRAcetam  500 mg Oral BID   nicotine  14 mg Transdermal Daily   pantoprazole  40 mg Oral QAC breakfast   sodium chloride  1 g Oral Daily   thiamine  250 mg Oral Daily      LOS: 4 days   Time spent: 62  Leslee Home, MD [facility] To  contact the attending provider between 7A-7P or the covering provider during after hours 7P-7A, please log into the web site [email or web address] and access using universal [place] password for that web site. If you do not have the password, please call the hospital operator.  11/26/2021, 2:48 PM

## 2021-11-27 DIAGNOSIS — G9341 Metabolic encephalopathy: Secondary | ICD-10-CM | POA: Diagnosis not present

## 2021-11-27 LAB — VITAMIN B1: Vitamin B1 (Thiamine): 223.3 nmol/L — ABNORMAL HIGH (ref 66.5–200.0)

## 2021-11-27 NOTE — Progress Notes (Signed)
Speech Language Pathology Treatment: Dysphagia  Patient Details Name: Carlos Wise MRN: [id number] DOB: December 04, 1940 Today's Date: 11/27/2021 Time: 0258-5277 SLP Time Calculation (min) (ACUTE ONLY): 11 min  Assessment / Plan / Recommendation Clinical Impression  Pt was seen for dysphagia treatment and was cooperative throughout the session. Pt, nursing, and nurse tech reported that the pt has been tolerating the current diet without overt s/sx of aspiration. Pt tolerated regular texture solids, and thin liquids via straw without s/sx of aspiration. Mastication and oral clearance were functional. Oral holding continues to be noted with thin liquids, but was less prevalent than during the initial evaluation. Without inquiry, pt stated, "I don't know why I do that- hold liquids in my mouth like that. I've been doing that for years and I don't know why. I guess I like to savor the flavor; especially when it's a gin and tonic." Pt's overall swallow mechanism appears WFL. It is recommended that the current diet be continued. Further skilled SLP services are not clinically indicated at this time.    HPI HPI: Pt is a 81 y.o. male who presented the ED via EMS evaluation of altered mental status. MRI brain negative. EEG: cortical dysfunction arising from left hemisphere, maximal frontotemporal region likely secondary to underlying structural abnormality, post-ictal state. PMH: alcohol use disorder, chronic hyponatremia, seizure disorder, hypertension, hyperlipidemia, prostate cancer status post radiation, prolonged QT interval, chronic HFpEF, peripheral neuropathy, GERD, depression. Pt followed for swallowing in August, 2021; was on a regular texture diet and thin liquids at that time.      SLP Plan  All goals met;Discharge SLP treatment due to (comment)      Recommendations for follow up therapy are one component of a multi-disciplinary discharge planning process, led by the attending physician.   Recommendations may be updated based on patient status, additional functional criteria and insurance authorization.    Recommendations  Diet recommendations: Regular;Thin liquid Liquids provided via: Cup;Straw Medication Administration: Whole meds with liquid (or puree) Supervision: Patient able to self feed;Full supervision/cueing for compensatory strategies Compensations: Minimize environmental distractions;Slow rate;Small sips/bites Postural Changes and/or Swallow Maneuvers: Seated upright 90 degrees                Oral Care Recommendations: Oral care BID Follow Up Recommendations: No SLP follow up Assistance recommended at discharge: Intermittent Supervision/Assistance SLP Visit Diagnosis: Dysphagia, unspecified (R13.10) Plan: All goals met;Discharge SLP treatment due to (comment)         Carlos Wise, [place], [place] Office number [phone] Pager [place]  11/27/2021, 3:07 PM

## 2021-11-27 NOTE — Progress Notes (Signed)
PROGRESS NOTE   Carlos Wise  JAS:[id number] DOB: 1940-12-08 DOA: 11/22/2021 PCP: [name], MD  Brief Narrative:   81 year old white male HTN HLD HFpEF peripheral neuropathy on gabapentin Prostate cancer status post XRT with prior bulbar urethral stricture causing retention status post dilatation 2019 with Dr. Louis Meckel prolonged QTc  History of right ICA aneurysm status post coiling 02/09/2020 admit -seizures placed on Keppra at that time 2/2 EEG showing epileptogenicity left temporal region--Underlying?  Hyponatremia was placed on salt tablets which he is noncompliant compliant on Last EEG 08/12/2020 was normal with no epileptiform discharges and it appears he was lost to follow-up Known daily drinker  Presented to ED 6/10 inability to answer questions properly had vomited in the bed and was not making sense he was quite restless ill-appearing and inattentive as well as agitated and hyperactive  Sodium 126 chloride 91 BUN/creatinine 12/0.8 total bili 1.4 WBC 12.7 hemoglobin 13.2 platelet 262 Ammonia was normal Urine tox screen negative, blood alcohol was less than 10 Cortisol was never obtained  CT head generalized atrophy microvascular changes CXR no acute abnormality CT abdomen pelvis no acute intrapelvic pathology with diverticulosis aortic atherosclerosis-multiple markers within the prostate gland old healed left posterior rib fractures MRI brain truncated exam due to altered mental status generalized atrophy chronic changes  Foley could not be placed in the ED so urology was consulted and they placed a Foley and instructed not to remove catheter until back to baseline mentation  Hospital-Problem based course  Acute metabolic encephalopathy due to uncontrolled seizures Probable nidus was hyponatremia and alcoholism EEG this admission showed left cortical dysfunction--epileptologist neurology saw the patient recommended Keppra 500 twice daily [he wasn't taking the keppra  PTA] MRI brain no acute findings B12 wnl Because of encephalopathy started on IV B1 '@500'$  3 times daily- will switch to oral and continue on d/c Initially he was felt to be a poor candidate for diet and was quite encephalopathic because of Ativan but now he is improved and he has been graduated to regular diet Acute superimposed on chronic hyponatremia-sodium 126 on admission, osmolality 271, urine osmolality of 126 urine sodium 20-etiology secondary to salt wasting from volume depletion Replacing with salt tabs 1 g daily, will need to continue this Normal saline was discontinued as he is eating and drinking sodium is improving with replacement of sodium TSH was within normal limits Acute alcoholic withdrawal on admission Alcohol protocol withdrawal discontinued on 6/13-monitor mentation-he is improving Need for coud catheter- mentation back to baseline; Discontinue coude catheter Leukocytosis etiology unclear Seems to be trending down-chest x-ray was negative for any pneumonia Because the patient was in retention with stricture, would cover with Keflex daily for 3 days and then discontinue Resume Myrbetriq 25 daily if is able to remove catheter --risk of retention with this HFpEF Was a little drier earlier in hospital stay and receiving fluids-not on any prior to admission diuretics Neuropathy NOS Discontinued completely gabapentin 300 twice daily Depression Hold off on Lexapro 20 daily for now   DVT prophylaxis: SCD Code Status: Full Family Communication: no family present at bedside Disposition:  Status is: Inpatient Remains inpatient appropriate because: awaiting placment   Consultants:  none  Procedures: none  Antimicrobials: Keflex    Subjective:  Looks better today. Alert and oriented x2. Sitting in the recliner. No acute complaints.  Objective: Vitals:   11/27/21 0309 11/27/21 0814 11/27/21 1100 11/27/21 1618  BP: (!) 144/83 136/87 (!) 101/56 137/82  Pulse: 73  77 91  87  Resp: '19 18 18 18  '$ Temp: 97.8 F (36.6 C) 98.2 F (36.8 C) 98.5 F (36.9 C) 98.6 F (37 C)  TempSrc: Oral Oral Oral Oral  SpO2: 98% 99% 96% 97%  Weight:      Height:        Intake/Output Summary (Last 24 hours) at 11/27/2021 1729 Last data filed at 11/27/2021 1350 Gross per 24 hour  Intake 340 ml  Output 2130 ml  Net -1790 ml    Filed Weights   11/22/21 1307  Weight: 86.2 kg    Examination:  Awake coherent slow to speak but answers correctly oriented to name and place today Cta b no added sound no rales rhonchi Abd soft nt nd no HSM RRR No leg edema Moving legs   Data Reviewed: personally reviewed   CBC    Component Value Date/Time   WBC 10.7 (H) 11/26/2021 0144   RBC 3.82 (L) 11/26/2021 0144   HGB 12.5 (L) 11/26/2021 0144   HCT 35.4 (L) 11/26/2021 0144   PLT 280 11/26/2021 0144   MCV 92.7 11/26/2021 0144   MCH 32.7 11/26/2021 0144   MCHC 35.3 11/26/2021 0144   RDW 12.5 11/26/2021 0144   LYMPHSABS 1.6 11/26/2021 0144   MONOABS 1.0 11/26/2021 0144   EOSABS 0.7 (H) 11/26/2021 0144   BASOSABS 0.1 11/26/2021 0144      Latest Ref Rng & Units 11/26/2021    1:44 AM 11/25/2021    2:53 AM 11/24/2021    3:52 AM  CMP  Glucose 70 - 99 mg/dL 109  87  116   BUN 8 - 23 mg/dL '13  15  16   '$ Creatinine 0.61 - 1.24 mg/dL 0.87  0.85  0.96   Sodium 135 - 145 mmol/L 131  133  132   Potassium 3.5 - 5.1 mmol/L 3.5  3.7  3.9   Chloride 98 - 111 mmol/L 97  99  101   CO2 22 - 32 mmol/L '26  24  22   '$ Calcium 8.9 - 10.3 mg/dL 8.9  9.0  9.1      Radiology Studies: No results found.   Scheduled Meds:  cephALEXin  500 mg Oral Q12H   chlorhexidine  15 mL Mouth Rinse BID   Chlorhexidine Gluconate Cloth  6 each Topical Daily   folic acid  1 mg Oral Daily   levETIRAcetam  500 mg Oral BID   nicotine  14 mg Transdermal Daily   pantoprazole  40 mg Oral QAC breakfast   sodium chloride  1 g Oral Daily   thiamine  250 mg Oral Daily      LOS: 5 days   Time spent:  80  Leslee Home, MD [facility] To contact the attending provider between 7A-7P or the covering provider during after hours 7P-7A, please log into the web site [email or web address] and access using universal [place] password for that web site. If you do not have the password, please call the hospital operator.  11/27/2021, 5:29 PM

## 2021-11-27 NOTE — Progress Notes (Addendum)
Occupational Therapy Treatment Patient Details Name: Carlos Wise MRN: [id number] DOB: 02-26-1941 Today's Date: 11/27/2021   History of present illness Pt is a 81 y/o M presenting to ED via EMS from home on 6/10 with AMS. CT negative, MRI negative, EEG showing cortical dysfunction arising from L frontotemporal region, post ictal state. PMH includes alcohol use disorder, chronic hyponatremia, seizure disorder, HTN, HLD, prostate cancer post radiation, chronic HFpEF,  peripheral neuropathy, GERD, and depression.   OT comments  Patient progressing and showed improved ability to stand for ADLs, progressing to fair dynamic standing balance while pt released RW with BUEs to wash lower body with Min guard to Min As compared to previous session when pt required seated ADLs for safety.  Pt also met OT goal to complete a 3 step trail making activity with completion of a 28 step task with intact accuracy and time. Pt is very HOH abd difficult to determine cognitive impairment vs hearing loss at times.  Patient remains limited by mild confusion, generalized weakness and decreased activity tolerance along with deficits noted below. Pt continues to demonstrate good rehab potential and would benefit from continued skilled OT to increase safety and independence with ADLs and functional transfers to allow pt to return home safely and reduce caregiver burden and fall risk.    Recommendations for follow up therapy are one component of a multi-disciplinary discharge planning process, led by the attending physician.  Recommendations may be updated based on patient status, additional functional criteria and insurance authorization.    Follow Up Recommendations  Acute inpatient rehab (3hours/day)    Assistance Recommended at Discharge Intermittent Supervision/Assistance  Patient can return home with the following  A lot of help with walking and/or transfers;A lot of help with bathing/dressing/bathroom;Assistance with  cooking/housework;Assist for transportation;Help with stairs or ramp for entrance;Direct supervision/assist for medications management;Direct supervision/assist for financial management   Equipment Recommendations  Other (comment) (RW)    Recommendations for Other Services      Precautions / Restrictions Precautions Precautions: Fall Precaution Comments: foley cath, AMS Restrictions Weight Bearing Restrictions: No       Mobility Bed Mobility               General bed mobility comments: Pt up in recliner    Transfers                         Balance   Sitting-balance support: Feet supported, Bilateral upper extremity supported Sitting balance-Leahy Scale: Fair   Postural control: Posterior lean   Standing balance-Leahy Scale: Poor Standing balance comment: Poor progressing to fair after ~1 min of standing.                           ADL either performed or assessed with clinical judgement   ADL Overall ADL's : Needs assistance/impaired     Grooming: Sitting;Wash/dry face;Set up   Upper Body Bathing: Set up;Supervision/ safety;Sitting   Lower Body Bathing: Minimal assistance;Sit to/from stand;Cueing for safety;Cueing for sequencing Lower Body Bathing Details (indicate cue type and reason): Pt stood from recliner with Mod As. Pt with poor standing balaance initially and posterior push with need of BUE support. After about 1 min pt progressed to Min guard standing balanace and able to release first 1 hand and then bilateral  hands from walker to bathe peri areas front and back anmd upper legs with continued Min Guard. Cues throughout for safety/sequence. Upper  Body Dressing : Minimal assistance;Sitting;Cueing for sequencing Upper Body Dressing Details (indicate cue type and reason): Anterior gown.     Toilet Transfer: Moderate assistance;Rolling walker (2 wheels) Toilet Transfer Details (indicate cue type and reason): Pt stood from recliner with  Mod As and required Min-Mod As to control descent back to recliner   Toileting - Clothing Manipulation Details (indicate cue type and reason): catheter     Functional mobility during ADLs: Moderate assistance;Rolling walker (2 wheels);Cueing for safety;Cueing for sequencing      Extremity/Trunk Assessment Upper Extremity Assessment RUE Deficits / Details: 3+/5 MMT at R shoulder RUE Sensation: WNL RUE Coordination: decreased gross motor;decreased fine motor   Lower Extremity Assessment LLE Deficits / Details: L hip 2-, knee 2+ flexion  and ankle 2-   Cervical / Trunk Assessment Cervical / Trunk Assessment: Normal    Vision Baseline Vision/History: 1 Wears glasses Ability to See in Adequate Light: 0 Adequate Additional Comments: Reading glasses not in room. Pt able to complete 28 step trailmaking pencil/paper activity accurately in reasonable time.   Perception     Praxis      Cognition Arousal/Alertness: Awake/alert Behavior During Therapy: WFL for tasks assessed/performed Overall Cognitive Status: No family/caregiver present to determine baseline cognitive functioning                                 General Comments: HOH and mildly confused. Pt did complete 28 step trail making activity with cues to find start and connect similarily to "connect the dots" Pt then able to complete without any cues and with intact accuracy and reasonable time. Some increased time to find numbers without glasses.        Exercises      Shoulder Instructions       General Comments Patient aware of balance issues, reports I feel like I fall back easy; sat in chair at sink to brush teeth and comb hair prior to ambulation to improve level of arousal/alertness    Pertinent Vitals/ Pain       Pain Assessment Pain Assessment: No/denies pain  Home Living                                          Prior Functioning/Environment              Frequency            Progress Toward Goals  OT Goals(current goals can now be found in the care plan section)  Progress towards OT goals: Progressing toward goals  Acute Rehab OT Goals Patient Stated Goal: Go home OT Goal Formulation: With patient Time For Goal Achievement: 12/09/21 Potential to Achieve Goals: Good  Plan Discharge plan remains appropriate    Co-evaluation                 AM-PAC OT "6 Clicks" Daily Activity     Outcome Measure   Help from another person eating meals?: None Help from another person taking care of personal grooming?: A Little Help from another person toileting, which includes using toliet, bedpan, or urinal?: A Lot Help from another person bathing (including washing, rinsing, drying)?: A Little Help from another person to put on and taking off regular upper body clothing?: A Little Help from another person to put on and taking off regular lower  body clothing?: A Little 6 Click Score: 18    End of Session Equipment Utilized During Treatment: Gait belt;Rolling walker (2 wheels)  OT Visit Diagnosis: Unsteadiness on feet (R26.81);Other abnormalities of gait and mobility (R26.89);Muscle weakness (generalized) (M62.81)   Activity Tolerance Patient tolerated treatment well   Patient Left in chair;with call bell/phone within reach;with chair alarm set   Nurse Communication Mobility status        Time: 1250-8719 OT Time Calculation (min): 43 min  Charges: OT General Charges $OT Visit: 1 Visit OT Treatments $Self Care/Home Management : 23-37 mins $Therapeutic Activity: 8-22 mins  [name], [place] Office: [phone] 11/27/2021  Julien Girt 11/27/2021, 2:21 PM

## 2021-11-27 NOTE — Plan of Care (Signed)
  Problem: Education: Goal: Knowledge of General Education information will improve Description: Including pain rating scale, medication(s)/side effects and non-pharmacologic comfort measures Outcome: Progressing   Problem: Clinical Measurements: Goal: Will remain free from infection Outcome: Progressing   Problem: Activity: Goal: Risk for activity intolerance will decrease Outcome: Progressing   Problem: Nutrition: Goal: Adequate nutrition will be maintained Outcome: Progressing   Problem: Coping: Goal: Level of anxiety will decrease Outcome: Progressing   Problem: Safety: Goal: Ability to remain free from injury will improve Outcome: Progressing   

## 2021-11-27 NOTE — Progress Notes (Signed)
Physical Therapy Treatment Patient Details Name: Carlos Wise MRN: [id number] DOB: 09/19/1940 Today's Date: 11/27/2021   History of Present Illness Pt is a 81 y/o M presenting to ED via EMS from home on 6/10 with AMS. CT negative, MRI negative, EEG showing cortical dysfunction arising from L frontotemporal region, post ictal state. PMH includes alcohol use disorder, chronic hyponatremia, seizure disorder, HTN, HLD, prostate cancer post radiation, chronic HFpEF,  peripheral neuropathy, GERD, and depression.    PT Comments    Patient slow to arouse this morning and needed increased time for improvements in balance.  Sat in chair to brush teeth and comb hair.  But once more alert able to realize it was almost his birthday and stated his "girlfriend" was coming today.  He started off with significant posterior lean with sit to stand and ambulation needing heavy mod A, but progressed to less assist with more time up walking.  Seemed better going forward with some more difficulty with turns.  Patient remains a high fall risk and will need continued skilled PT in the acute setting.  Had some assist for medications and transportation previously.  Will need STSNF level rehab prior to d/c home as evidently not appropriate for acute inpatient rehab.     Recommendations for follow up therapy are one component of a multi-disciplinary discharge planning process, led by the attending physician.  Recommendations may be updated based on patient status, additional functional criteria and insurance authorization.  Follow Up Recommendations  Skilled nursing-short term rehab (<3 hours/day)     Assistance Recommended at Discharge Frequent or constant Supervision/Assistance  Patient can return home with the following A lot of help with walking and/or transfers;A little help with bathing/dressing/bathroom;Assistance with cooking/housework;Direct supervision/assist for medications management;Direct supervision/assist for  financial management;Assist for transportation;Help with stairs or ramp for entrance   Equipment Recommendations  None recommended by PT    Recommendations for Other Services       Precautions / Restrictions Precautions Precautions: Fall     Mobility  Bed Mobility Overal bed mobility: Needs Assistance Bed Mobility: Supine to Sit     Supine to sit: Min assist     General bed mobility comments: assist for trunk as pt pushing up on rail/EOB    Transfers Overall transfer level: Needs assistance Equipment used: Rolling walker (2 wheels) Transfers: Sit to/from Stand Sit to Stand: Mod assist, +2 safety/equipment           General transfer comment: initial posterior lean and increased time with lifting help to stand    Ambulation/Gait Ambulation/Gait assistance: Mod assist, Min assist, +2 safety/equipment Gait Distance (Feet): 180 Feet Assistive device: Rolling walker (2 wheels) Gait Pattern/deviations: Step-to pattern, Step-through pattern, Shuffle, Leaning posteriorly Gait velocity: varies from fast to slow     General Gait Details: initially with mores support due to posterior lean, improved with time and practice; increased time and assist for walker management and balance with turning,   Stairs             Wheelchair Mobility    Modified Rankin (Stroke Patients Only)       Balance Overall balance assessment: Needs assistance   Sitting balance-Leahy Scale: Fair   Postural control: Posterior lean   Standing balance-Leahy Scale: Poor Standing balance comment: leaning posteriorly initially and at times in session, improves at times with dynamic practice  Cognition Arousal/Alertness: Awake/alert Behavior During Therapy: WFL for tasks assessed/performed Overall Cognitive Status: No family/caregiver present to determine baseline cognitive functioning                                 General  Comments: Initially drowsy and needed increased time for improved level of alertness.  Also HOH so sometimes does not follow commands likely because he does not hear.        Exercises      General Comments General comments (skin integrity, edema, etc.): Patient aware of balance issues, reports I feel like I fall back easy; sat in chair at sink to brush teeth and comb hair prior to ambulation to improve level of arousal/alertness      Pertinent Vitals/Pain Pain Assessment Faces Pain Scale: No hurt    Home Living                          Prior Function            PT Goals (current goals can now be found in the care plan section) Progress towards PT goals: Progressing toward goals    Frequency    Min 3X/week      PT Plan Discharge plan needs to be updated    Co-evaluation              AM-PAC PT "6 Clicks" Mobility   Outcome Measure  Help needed turning from your back to your side while in a flat bed without using bedrails?: A Little Help needed moving from lying on your back to sitting on the side of a flat bed without using bedrails?: A Little Help needed moving to and from a bed to a chair (including a wheelchair)?: A Lot Help needed standing up from a chair using your arms (e.g., wheelchair or bedside chair)?: A Lot Help needed to walk in hospital room?: A Lot Help needed climbing 3-5 steps with a railing? : Total 6 Click Score: 13    End of Session Equipment Utilized During Treatment: Gait belt Activity Tolerance: Patient tolerated treatment well Patient left: in chair;with call bell/phone within reach;with chair alarm set   PT Visit Diagnosis: Unsteadiness on feet (R26.81);Muscle weakness (generalized) (M62.81);Difficulty in walking, not elsewhere classified (R26.2)     Time: 0930-1002 PT Time Calculation (min) (ACUTE ONLY): 32 min  Charges:  $Gait Training: 8-22 mins $Therapeutic Activity: 8-22 mins                     Carlos Wise,  PT Acute Rehabilitation Services Pager:[phone] [phone] 11/27/2021    Carlos Wise 11/27/2021, 12:40 PM

## 2021-11-27 NOTE — TOC Progression Note (Signed)
Transition of Care [facility]) - Progression Note    Patient Details  Name: Carlos Wise MRN: [id number] Date of Birth: May 19, 1941  Transition of Care [facility]) CM/SW [place], [place] Phone Number: 11/27/2021, 10:33 AM  Clinical Narrative:   CSW met with patient's caregiver, [name], at bedside to discuss SNF as patient is not a candidate for CIR. Per [name], patient's insurance did not pay for his wife's SNF stay a while ago even though they were told that it would be covered, so she is leery about going to SNF, but also the caregivers cannot provide the level of assist that the patient needs right now. CSW received permission to fax out referral, and will need to follow up with patient's sister, [name], for next of kin as patient does not have a healthcare POA; [name] is only financial POA. CSW faxed out referral, will follow with bed offers.    Expected Discharge Plan: [place] Barriers to Discharge: Continued Medical Work up, Ship broker  Expected Discharge Plan and Services Expected Discharge Plan: [place]   Discharge Planning Services: CM Consult   Living arrangements for the past 2 months: Single Family Home                                       Social Determinants of Health (SDOH) Interventions    Readmission Risk Interventions     No data to display

## 2021-11-28 ENCOUNTER — Other Ambulatory Visit (HOSPITAL_COMMUNITY): Payer: Self-pay

## 2021-11-28 DIAGNOSIS — G9341 Metabolic encephalopathy: Secondary | ICD-10-CM | POA: Diagnosis not present

## 2021-11-28 DIAGNOSIS — I5030 Unspecified diastolic (congestive) heart failure: Secondary | ICD-10-CM | POA: Diagnosis not present

## 2021-11-28 DIAGNOSIS — I10 Essential (primary) hypertension: Secondary | ICD-10-CM | POA: Diagnosis not present

## 2021-11-28 DIAGNOSIS — E785 Hyperlipidemia, unspecified: Secondary | ICD-10-CM | POA: Diagnosis not present

## 2021-11-28 LAB — CBC WITH DIFFERENTIAL/PLATELET
Abs Immature Granulocytes: 0.06 10*3/uL (ref 0.00–0.07)
Basophils Absolute: 0.1 10*3/uL (ref 0.0–0.1)
Basophils Relative: 1 %
Eosinophils Absolute: 0.6 10*3/uL — ABNORMAL HIGH (ref 0.0–0.5)
Eosinophils Relative: 7 %
HCT: 35.5 % — ABNORMAL LOW (ref 39.0–52.0)
Hemoglobin: 12.6 g/dL — ABNORMAL LOW (ref 13.0–17.0)
Immature Granulocytes: 1 %
Lymphocytes Relative: 15 %
Lymphs Abs: 1.4 10*3/uL (ref 0.7–4.0)
MCH: 32.6 pg (ref 26.0–34.0)
MCHC: 35.5 g/dL (ref 30.0–36.0)
MCV: 91.7 fL (ref 80.0–100.0)
Monocytes Absolute: 0.9 10*3/uL (ref 0.1–1.0)
Monocytes Relative: 10 %
Neutro Abs: 6 10*3/uL (ref 1.7–7.7)
Neutrophils Relative %: 66 %
Platelets: 258 10*3/uL (ref 150–400)
RBC: 3.87 MIL/uL — ABNORMAL LOW (ref 4.22–5.81)
RDW: 12.2 % (ref 11.5–15.5)
WBC: 9.1 10*3/uL (ref 4.0–10.5)
nRBC: 0 % (ref 0.0–0.2)

## 2021-11-28 LAB — BASIC METABOLIC PANEL
Anion gap: 11 (ref 5–15)
BUN: 13 mg/dL (ref 8–23)
CO2: 25 mmol/L (ref 22–32)
Calcium: 9 mg/dL (ref 8.9–10.3)
Chloride: 97 mmol/L — ABNORMAL LOW (ref 98–111)
Creatinine, Ser: 0.84 mg/dL (ref 0.61–1.24)
GFR, Estimated: >60 mL/min (ref >60)
Glucose, Bld: 100 mg/dL — ABNORMAL HIGH (ref 70–99)
Potassium: 3.7 mmol/L (ref 3.5–5.1)
Sodium: 133 mmol/L — ABNORMAL LOW (ref 135–145)

## 2021-11-28 LAB — CULTURE, BLOOD (ROUTINE X 2)
Culture: NO GROWTH
Culture: NO GROWTH
Special Requests: ADEQUATE
Special Requests: ADEQUATE

## 2021-11-28 MED ORDER — LEVETIRACETAM 500 MG PO TABS
500.0000 mg | ORAL_TABLET | Freq: Two times a day (BID) | ORAL | 0 refills | Status: DC
Start: 2021-11-28 — End: 2022-03-25
  Filled 2021-11-28: qty 60, 30d supply, fill #0

## 2021-11-28 MED ORDER — FOLIC ACID 1 MG PO TABS
1.0000 mg | ORAL_TABLET | Freq: Every day | ORAL | 0 refills | Status: AC
  Filled 2021-11-28: qty 30, 30d supply, fill #0

## 2021-11-28 MED ORDER — NICOTINE 14 MG/24HR TD PT24
14.0000 mg | MEDICATED_PATCH | Freq: Every day | TRANSDERMAL | 0 refills | Status: DC
Start: 2021-11-29 — End: 2022-04-09
  Filled 2021-11-28: qty 28, 28d supply, fill #0

## 2021-11-28 MED ORDER — THIAMINE HCL 100 MG PO TABS
250.0000 mg | ORAL_TABLET | Freq: Every day | ORAL | 0 refills | Status: AC
  Filled 2021-11-28: qty 75, 30d supply, fill #0

## 2021-11-28 NOTE — Progress Notes (Signed)
Pt d/c to home with caretaker at bedside, vs wnl at time of d/c. PIV d/c by pt, dressing applied. No distress

## 2021-11-28 NOTE — Progress Notes (Signed)
Physical Therapy Treatment Patient Details Name: Carlos Wise MRN: [id number] DOB: 1941/01/14 Today's Date: 11/28/2021   History of Present Illness Pt is a 81 y/o M presenting to ED via EMS from home on 6/10 with AMS. CT negative, MRI negative, EEG showing cortical dysfunction arising from L frontotemporal region, post ictal state. PMH includes alcohol use disorder, chronic hyponatremia, seizure disorder, HTN, HLD, prostate cancer post radiation, chronic HFpEF,  peripheral neuropathy, GERD, and depression.    PT Comments    Patient progressing with balance and transfers today.  Initially more support to stand, but less help from toilet when pulling up on grabbar.  He was initially wet and unclothed and had pulled out his IV.  Patient's baseline mentation  unknown.  He could potentially go home with HHPT and 24 hour capable caregivers who can provide hands on help for transfers and ambulation.  He reports has a stair lift for basement access but no stairs from outside to get in.  PT will follow acutely.   Recommendations for follow up therapy are one component of a multi-disciplinary discharge planning process, led by the attending physician.  Recommendations may be updated based on patient status, additional functional criteria and insurance authorization.  Follow Up Recommendations  Skilled nursing-short term rehab (<3 hours/day) (If capable 24 hour assist available recommend HHPT)     Assistance Recommended at Discharge Frequent or constant Supervision/Assistance  Patient can return home with the following A little help with bathing/dressing/bathroom;Assistance with cooking/housework;Direct supervision/assist for medications management;Direct supervision/assist for financial management;Assist for transportation;Help with stairs or ramp for entrance;A little help with walking and/or transfers   Equipment Recommendations  None recommended by PT    Recommendations for Other Services        Precautions / Restrictions Precautions Precautions: Fall Restrictions Weight Bearing Restrictions: No     Mobility  Bed Mobility Overal bed mobility: Needs Assistance Bed Mobility: Supine to Sit     Supine to sit: Supervision     General bed mobility comments: pulls up on rails    Transfers Overall transfer level: Needs assistance Equipment used: Rolling walker (2 wheels) Transfers: Sit to/from Stand Sit to Stand: Mod assist           General transfer comment: cues for anterior weight shift, some lifting help    Ambulation/Gait Ambulation/Gait assistance: Min assist Gait Distance (Feet): 160 Feet Assistive device: Rolling walker (2 wheels) Gait Pattern/deviations: Step-through pattern, Decreased dorsiflexion - left, Knee flexed in stance - left, Decreased step length - left, Leaning posteriorly       General Gait Details: at times with mild posterior lean and one episode pt taking hand off walker to scratch his back, noting L sided issues with toe first at initial contact and knee flexed in stance.  Able to turn walker a little better, but sequencing to get into bathroom (walker sideways) with max multimodal cues for safety   Stairs             Wheelchair Mobility    Modified Rankin (Stroke Patients Only)       Balance Overall balance assessment: Needs assistance Sitting-balance support: Feet supported Sitting balance-Leahy Scale: Fair   Postural control: Posterior lean Standing balance support: Bilateral upper extremity supported, Reliant on assistive device for balance Standing balance-Leahy Scale: Poor                              Cognition Arousal/Alertness: Awake/alert Behavior  During Therapy: WFL for tasks assessed/performed Overall Cognitive Status: No family/caregiver present to determine baseline cognitive functioning                                 General Comments: having urinary issues in bed trying to  use urinal, removed gown (which was wet) and IV sitting on bedside table (RN aware).  But oriented to situation and place.        Exercises      General Comments General comments (skin integrity, edema, etc.): assisted to don clean gown and noted upper back with red raised bumps with pt c/o itching.  initially trying to use urinal in bed with opening tipped down and bed already wet.  Toileted in bathroom      Pertinent Vitals/Pain Pain Assessment Faces Pain Scale: No hurt    Home Living                          Prior Function            PT Goals (current goals can now be found in the care plan section) Progress towards PT goals: Progressing toward goals    Frequency    Min 3X/week      PT Plan      Co-evaluation              AM-PAC PT "6 Clicks" Mobility   Outcome Measure  Help needed turning from your back to your side while in a flat bed without using bedrails?: A Little Help needed moving from lying on your back to sitting on the side of a flat bed without using bedrails?: A Little Help needed moving to and from a bed to a chair (including a wheelchair)?: A Little Help needed standing up from a chair using your arms (e.g., wheelchair or bedside chair)?: A Little Help needed to walk in hospital room?: A Little Help needed climbing 3-5 steps with a railing? : Total 6 Click Score: 16    End of Session Equipment Utilized During Treatment: Gait belt Activity Tolerance: Patient tolerated treatment well Patient left: in chair;with call bell/phone within reach;with chair alarm set   PT Visit Diagnosis: Unsteadiness on feet (R26.81);Muscle weakness (generalized) (M62.81);Difficulty in walking, not elsewhere classified (R26.2)     Time: 1029-1100 PT Time Calculation (min) (ACUTE ONLY): 31 min  Charges:  $Gait Training: 8-22 mins $Therapeutic Activity: 8-22 mins                     Carlos Wise, PT Acute Rehabilitation  Services Pager:[phone] Office:[phone] 11/28/2021    Carlos Wise 11/28/2021, 11:27 AM

## 2021-11-28 NOTE — TOC Transition Note (Signed)
Transition of Care [facility]) - CM/SW Discharge Note   Patient Details  Name: Carlos Wise MRN: [id number] Date of Birth: [birth year]  [facility]) CM/SW Contact:  Geralynn Ochs, LCSW Phone Number: 11/28/2021, 1:52 PM   Clinical Narrative:   CSW alerted by PT of patient's improvements in mobility, and discussed with caregiver, [name], who confirmed that is pretty close to patient's baseline. Patient will have 24/7 caregivers at home and would prefer to go home over SNF. CSW discussed home health, but patient is already established with appointments for outpatient and they would prefer to keep those. Patient has a rollater at home, agreeable to a rolling walker. CSW ordered RW through [employer] and updated MD on discharge home. Caregiver requested medicines be sent to [place]. No other needs identified at this time.    Final next level of care: Home/Self Care Barriers to Discharge: Barriers Resolved   Patient Goals and CMS Choice        Discharge Placement                Patient to be transferred to facility by: Family Name of family member notified: [name] Patient and family notified of of transfer: 11/28/21  Discharge Plan and Services   Discharge Planning Services: CM Consult            DME Arranged: Gilford Rile rolling DME Agency: [employer] Date DME Agency Contacted: 11/28/21   Representative spoke with at DME Agency: Jodell Cipro            Social Determinants of Health ([place]) Interventions     Readmission Risk Interventions     No data to display

## 2021-11-28 NOTE — Discharge Summary (Signed)
Physician Discharge Summary   Patient: Carlos Wise MRN: [id number] DOB: 1940/08/13  Admit date:     11/22/2021  Discharge date: 11/28/21  Discharge Physician: Leslee Home   PCP: [name], MD   Recommendations at discharge:  1) Follow up with PCP, neurology and Urology  Discharge Diagnoses: Acute metabolic encephalopathy due to uncontrolled seizures Acute on chronic hyponatremia Acute alcohol withdrawal, resolved Acute urinary retention with history of urinary stricture, status post Foley, now removed with encephalopathy resolved HFpEF, not in acute decompensation Neuropathy Depression  Hospital Course: 81 year old Caucasian male with a past medical history of hypertension, hyperlipidemia, HFpEF, peripheral neuropathy, prostate cancer status post XRT with prior bulbar urethral stricture causing retention status post dilatation 2019 with Dr. Louis Meckel, prolonged QTc, history of right ICA aneurysm status post coiling, history of disorder, alcohol dependence.  During hospitalization in 2021 EEG at that time showed epileptiform activity and was placed on Keppra.  Had a repeat EEG in 2022 that was normal and then lost to follow-up.  Presented to the emergency department on 6/10 with encephalopathy and inability answer questions and vomiting.  CT head showed no acute findings.  Jennefer Bravo of the brain was negative.  Foley could not be placed in the ED so urology was consulted and they placed a Foley and instructed not to remove the catheter until he is back to baseline mental status.  EEG this admission showed left cortical dysfunction.  Epileptologist and neurology saw the patient and recommended Keppra twice daily.  Was not taking prior to arrival.  B12 levels normal.  Concern for Warnicke's and was started on IV thiamine and now will be discharged on oral thiamine.  Encephalopathy resolved.  Had an acute on chronic anemia and was on salt tabs but not pliant with him.  Resumed during this  hospitalization and hyponatremia essentially resolved.  TSH was normal.  Was on CIWA protocol initially and was discontinued as his mental status continued to improve.  Was covered with Keppra for 3 days due to leukocytosis of unclear etiology in the setting of retention and urinary stricture.  Leukocytosis resolved. Patient will have 24/7 caregivers at home and would prefer to go home over SNF. CSW discussed home health, but patient is already established with appointments for outpatient and they would prefer to keep those. Patient has a rollater at home, agreeable to a rolling walker. CSW ordered RW through [employer].  Foley was removed as his encephalopathy resolved.  Passed voiding trial.  Follow-up with PCP, neurology and urology outpatient.    Consultants: Neurology, Urology Procedures performed: None Disposition: Home Diet recommendation:  Discharge Diet Orders (From admission, onward)     Start     Ordered   11/28/21 0000  Diet general        11/28/21 1314           Regular diet DISCHARGE MEDICATION: Allergies as of 11/28/2021       Reactions   Penicillins Other (See Comments)   Patient states no allergy, but has preference that he does not want this medication        Medication List     STOP taking these medications    Aleve PM 220-25 MG Tabs Generic drug: Naproxen Sod-diphenhydrAMINE   gabapentin 300 MG capsule Commonly known as: NEURONTIN   NyQuil Severe Cold/Flu 5-6.25-10-325 MG/15ML Liqd Generic drug: Phenyleph-Doxylamine-DM-APAP   OVER THE COUNTER MEDICATION       TAKE these medications    Allergy Eye Drops 0.025 %  ophthalmic solution Generic drug: ketotifen Place 1 drop into both eyes daily as needed (red eyes).   bismuth subsalicylate 998 PJ/82NK suspension Commonly known as: PEPTO BISMOL Take 30 mLs by mouth every 6 (six) hours as needed for indigestion.   cholecalciferol 25 MCG (1000 UNIT) tablet Commonly known as: VITAMIN D3 Take 1,000 Units  by mouth daily.   clobetasol 0.05 % external solution Commonly known as: TEMOVATE Apply 1 application  topically 2 (two) times daily as needed for itching.   escitalopram 20 MG tablet Commonly known as: LEXAPRO Take 20 mg by mouth daily.   fluticasone 50 MCG/ACT nasal spray Commonly known as: FLONASE Place 2 sprays into both nostrils daily as needed for allergies or rhinitis.   folic acid 1 MG tablet Commonly known as: FOLVITE Take 1 tablet (1 mg total) by mouth daily. Start taking on: November 29, 2021   levETIRAcetam 500 MG tablet Commonly known as: KEPPRA Take 1 tablet (500 mg total) by mouth 2 (two) times daily.   loratadine 10 MG tablet Commonly known as: CLARITIN Take 10 mg by mouth daily.   Myrbetriq 25 MG Tb24 tablet Generic drug: mirabegron ER Take 25 mg by mouth daily.   nicotine 14 mg/24hr patch Commonly known as: NICODERM CQ - dosed in mg/24 hours Place 1 patch (14 mg total) onto the skin daily. Start taking on: November 29, 2021   pantoprazole 40 MG tablet Commonly known as: PROTONIX Take 40 mg by mouth daily before breakfast.   polyethylene glycol 17 g packet Commonly known as: MIRALAX / GLYCOLAX Take 17 g by mouth daily as needed for severe constipation.   sodium chloride 0.65 % Soln nasal spray Commonly known as: OCEAN Place 1 spray into both nostrils daily as needed for congestion.   sodium chloride 1 g tablet Take 1 g by mouth daily.   thiamine 100 MG tablet Take 2.5 tablets (250 mg total) by mouth daily. Start taking on: November 29, 2021   triamcinolone cream 0.1 % Commonly known as: KENALOG Apply 1 application  topically 2 (two) times daily as needed for skin breakdown.   VITAMIN B-12 ER PO Take 1,000 mcg by mouth daily.        Follow-up Information     Hamrick, Lorin Mercy, MD. Schedule an appointment as soon as possible for a visit in 1 week(s).   Specialty: Family Medicine Contact information: [place]  [address] [phone]         Ardis Hughs, MD. Schedule an appointment as soon as possible for a visit in 2 week(s).   Specialty: Urology Contact information: [place] [address] [phone]         Rosalin Hawking, MD. Schedule an appointment as soon as possible for a visit in 2 week(s).   Specialty: Neurology Contact information: [place] [address] [phone]                Discharge Exam: Danley Danker Weights   11/22/21 1307  Weight: 86.2 kg   Awake coherent slow to speak but answers correctly oriented to name and place today Cta b no added sound no rales rhonchi Abd soft nt nd no HSM RRR No leg edema Moving legs    Condition at discharge: fair  The results of significant diagnostics from this hospitalization (including imaging, microbiology, ancillary and laboratory) are listed below for reference.   Imaging Studies: EEG adult  Result Date:  11/23/2021 Lora Havens, MD     11/23/2021  8:52 AM Patient Name: Carlos Wise MRN: [id number] Epilepsy Attending: Lora Havens Referring Physician/Provider: Rosalin Hawking, MD Date: 11/23/2021 Duration: 21.14 mins Patient history: 81 y.o. male with PMH of alcohol abuse, chronic hyponatremia, seizure disorder, hypertension, hyperlipidemia, prostate cancer status post radiation, CHF presented to ED for AMS with nause and vomiting and confusion and dysarthria. EEG to evaluate for seizure. Level of alertness: Awake, asleep AEDs during EEG study: None Technical aspects: This EEG study was done with scalp electrodes positioned according to the 10-20 International system of electrode placement. Electrical activity was acquired at a sampling rate of '500Hz'$  and reviewed with a high frequency filter of '70Hz'$  and a low frequency filter of '1Hz'$ . EEG data were recorded continuously and digitally stored. Description: The posterior dominant rhythm consists of '8Hz'$  activity of moderate  voltage (25-35 uV) seen predominantly in posterior head regions, symmetric and reactive to eye opening and eye closing. Sleep was characterized by vertex waves, sleep spindles (12 to 14 Hz), maximal frontocentral region. EEG showed continuous 3 to 6 Hz theta-delta slowing in left hemisphere, maximal frontotemporal region. Hyperventilation and photic stimulation were not performed.   ABNORMALITY - Continuous slow, left hemisphere, maximal frontotemporal region IMPRESSION: This study is suggestive of cortical dysfunction arising from left hemisphere, maximal frontotemporal region likely secondary to underlying structural abnormality, post-ictal state. No seizures or epileptiform discharges were seen throughout the recording. Lora Havens   MR BRAIN WO CONTRAST  Result Date: 11/23/2021 CLINICAL DATA:  Delirium EXAM: MRI HEAD WITHOUT CONTRAST TECHNIQUE: Multiplanar, multiecho pulse sequences of the brain and surrounding structures were obtained without intravenous contrast. COMPARISON:  None Available. FINDINGS: The examination had to be terminated prior to completion due to patient altered mental status and inability to cooperate with the technologist. Diffusion-weighted imaging, sagittal T1 and axial T2-weighted images were obtained. There is no acute ischemia. No midline shift or other mass effect. There is generalized atrophy with findings of chronic microvascular ischemia. The midline structures are normal. The major flow voids are normal. IMPRESSION: 1. Truncated examination due to patient altered mental status and inability to cooperate with the technologist. 2. No acute ischemia. 3. Generalized atrophy and findings of chronic microvascular ischemia. Electronically Signed   By: Ulyses Jarred M.D.   On: 11/23/2021 03:50   CT ABDOMEN PELVIS WO CONTRAST  Result Date: 11/22/2021 CLINICAL DATA:  Nausea and vomiting. EXAM: CT ABDOMEN AND PELVIS WITHOUT CONTRAST TECHNIQUE: Multidetector CT imaging of the  abdomen and pelvis was performed following the standard protocol without IV contrast. RADIATION DOSE REDUCTION: This exam was performed according to the departmental dose-optimization program which includes automated exposure control, adjustment of the mA and/or kV according to patient size and/or use of iterative reconstruction technique. COMPARISON:  None Available. FINDINGS: Evaluation of this exam is limited in the absence of intravenous contrast. Lower chest: The visualized lung bases are clear. There is coronary vascular calcification. No intra-abdominal free air or free fluid. Hepatobiliary: No focal liver abnormality is seen. No gallstones, gallbladder wall thickening, or biliary dilatation. Pancreas: Unremarkable. No pancreatic ductal dilatation or surrounding inflammatory changes. Spleen: Normal in size without focal abnormality. Adrenals/Urinary Tract: The adrenal glands unremarkable. There are 2 left renal hypodense lesions with the larger measuring up to 3.5 cm in the upper pole. These lesions are suboptimally characterized on this noncontrast CT but demonstrate fluid attenuation most consistent with cysts. There is no hydronephrosis or nephrolithiasis on either side. The  visualized ureters and urinary bladder appear unremarkable. Stomach/Bowel: Moderate stool throughout the colon. There is sigmoid diverticulosis without active inflammatory changes. There is no bowel obstruction or active inflammation. The appendix is normal. Vascular/Lymphatic: Advanced aortoiliac atherosclerotic disease. The aorta is ectatic measuring up to 2.8 cm. The IVC is unremarkable. No portal venous gas. There is no adenopathy. Reproductive: Multiple fiducial markers within the prostate gland. The seminal vesicles are symmetric. Other: None Musculoskeletal: Osteopenia with degenerative changes of the spine. No acute osseous pathology. Old healed left posterior rib fractures. IMPRESSION: 1. No acute intra-abdominal or pelvic  pathology. 2. Sigmoid diverticulosis. 3. Aortic Atherosclerosis (ICD10-I70.0). Electronically Signed   By: Anner Crete M.D.   On: 11/22/2021 23:27   CT Head Wo Contrast  Result Date: 11/22/2021 CLINICAL DATA:  Altered mental status. EXAM: CT HEAD WITHOUT CONTRAST TECHNIQUE: Contiguous axial images were obtained from the base of the skull through the vertex without intravenous contrast. RADIATION DOSE REDUCTION: This exam was performed according to the departmental dose-optimization program which includes automated exposure control, adjustment of the mA and/or kV according to patient size and/or use of iterative reconstruction technique. COMPARISON:  August 31, 2020 FINDINGS: Brain: There is mild to moderate severity cerebral atrophy with widening of the extra-axial spaces and ventricular dilatation. There are areas of decreased attenuation within the white matter tracts of the supratentorial brain, consistent with microvascular disease changes. Vascular: No hyperdense vessel or unexpected calcification. A small metallic density foreign body is seen adjacent to the anteromedial aspect of the cavernous carotid artery on the right. Skull: Normal. Negative for fracture or focal lesion. Sinuses/Orbits: No acute finding. Other: None. IMPRESSION: 1. No acute intracranial abnormality. 2. Generalized cerebral atrophy and microvascular disease changes of the supratentorial brain. Electronically Signed   By: Virgina Norfolk M.D.   On: 11/22/2021 16:55   DG Chest Port 1 View  Result Date: 11/22/2021 CLINICAL DATA:  Altered mental status EXAM: PORTABLE CHEST 1 VIEW COMPARISON:  02/04/2020 FINDINGS: The heart size and mediastinal contours are within normal limits. Both lungs are clear. Chronic fracture deformities of the posterior left ribs. IMPRESSION: No acute abnormality of the lungs in AP portable projection. Electronically Signed   By: Delanna Ahmadi M.D.   On: 11/22/2021 13:55    Microbiology: Results for  orders placed or performed during the hospital encounter of 11/22/21  Culture, blood (Routine X 2) w Reflex to ID Panel     Status: None   Collection Time: 11/23/21  1:47 AM   Specimen: BLOOD  Result Value Ref Range Status   Specimen Description BLOOD SITE NOT SPECIFIED  Final   Special Requests   Final    BOTTLES DRAWN AEROBIC AND ANAEROBIC Blood Culture adequate volume   Culture   Final    NO GROWTH 5 DAYS Performed at [facility], [place] [address] [place], [place] [address]    Report Status 11/28/2021 FINAL  Final  Culture, blood (Routine X 2) w Reflex to ID Panel     Status: None   Collection Time: 11/23/21  1:48 AM   Specimen: BLOOD  Result Value Ref Range Status   Specimen Description BLOOD SITE NOT SPECIFIED  Final   Special Requests   Final    BOTTLES DRAWN AEROBIC AND ANAEROBIC Blood Culture adequate volume   Culture   Final    NO GROWTH 5 DAYS Performed at [facility], [place] [address] [place], [place] [address]    Report Status 11/28/2021 FINAL  Final    Labs: CBC: Recent Labs  Lab 11/22/21 1336 11/22/21 1359 11/23/21 0628 11/24/21 0352 11/25/21 0253 11/26/21 0144 11/28/21 0217  WBC 12.7*  --  13.5* 16.3* 11.1* 10.7* 9.1  NEUTROABS 11.2*  --   --   --   --  7.2 6.0  HGB 13.2   < > 12.2* 12.9* 12.4* 12.5* 12.6*  HCT 36.4*   < > 34.9* 36.4* 35.0* 35.4* 35.5*  MCV 90.1  --  92.6 91.0 92.8 92.7 91.7  PLT 262  --  243 272 249 280 258   < > = values in this interval not displayed.   Basic Metabolic Panel: Recent Labs  Lab 11/23/21 0148 11/23/21 1962 11/24/21 0352 11/25/21 0253 11/26/21 0144 11/28/21 0217  NA 130* 129* 132* 133* 131* 133*  K 3.7 3.5 3.9 3.7 3.5 3.7  CL 97* 99 101 99 97* 97*  CO2 21* 20* '22 24 26 25  '$ GLUCOSE 104* 102* 116* 87 109* 100*  BUN '15 16 16 15 13 13  '$ CREATININE 0.88 0.84 0.96 0.85 0.87 0.84  CALCIUM 9.3 8.6* 9.1 9.0 8.9 9.0  MG 2.0  --  2.1  --   --   --    Liver Function Tests: Recent Labs  Lab 11/22/21 1336   AST 21  ALT 13  ALKPHOS 63  BILITOT 1.4*  PROT 7.1  ALBUMIN 4.2   CBG: No results for input(s): "GLUCAP" in the last 168 hours.  Discharge time spent: greater than 30 minutes.  Signed: Leslee Home, DO [facility] 11/28/2021

## 2021-12-03 DIAGNOSIS — R262 Difficulty in walking, not elsewhere classified: Secondary | ICD-10-CM | POA: Diagnosis not present

## 2021-12-03 DIAGNOSIS — R278 Other lack of coordination: Secondary | ICD-10-CM | POA: Diagnosis not present

## 2021-12-03 DIAGNOSIS — Z9181 History of falling: Secondary | ICD-10-CM | POA: Diagnosis not present

## 2021-12-05 DIAGNOSIS — N35919 Unspecified urethral stricture, male, unspecified site: Secondary | ICD-10-CM | POA: Diagnosis not present

## 2021-12-05 DIAGNOSIS — R338 Other retention of urine: Secondary | ICD-10-CM | POA: Diagnosis not present

## 2021-12-08 DIAGNOSIS — R262 Difficulty in walking, not elsewhere classified: Secondary | ICD-10-CM | POA: Diagnosis not present

## 2021-12-08 DIAGNOSIS — Z9181 History of falling: Secondary | ICD-10-CM | POA: Diagnosis not present

## 2021-12-08 DIAGNOSIS — R278 Other lack of coordination: Secondary | ICD-10-CM | POA: Diagnosis not present

## 2021-12-09 DIAGNOSIS — R1312 Dysphagia, oropharyngeal phase: Secondary | ICD-10-CM | POA: Diagnosis not present

## 2021-12-09 DIAGNOSIS — R69 Illness, unspecified: Secondary | ICD-10-CM | POA: Diagnosis not present

## 2021-12-09 DIAGNOSIS — R1013 Epigastric pain: Secondary | ICD-10-CM | POA: Diagnosis not present

## 2021-12-09 DIAGNOSIS — Z791 Long term (current) use of non-steroidal anti-inflammatories (NSAID): Secondary | ICD-10-CM | POA: Diagnosis not present

## 2021-12-10 ENCOUNTER — Other Ambulatory Visit: Payer: Self-pay | Admitting: Gastroenterology

## 2021-12-10 DIAGNOSIS — Z9181 History of falling: Secondary | ICD-10-CM | POA: Diagnosis not present

## 2021-12-10 DIAGNOSIS — R1013 Epigastric pain: Secondary | ICD-10-CM

## 2021-12-10 DIAGNOSIS — R262 Difficulty in walking, not elsewhere classified: Secondary | ICD-10-CM | POA: Diagnosis not present

## 2021-12-10 DIAGNOSIS — R278 Other lack of coordination: Secondary | ICD-10-CM | POA: Diagnosis not present

## 2021-12-10 DIAGNOSIS — R1312 Dysphagia, oropharyngeal phase: Secondary | ICD-10-CM

## 2021-12-10 DIAGNOSIS — F1010 Alcohol abuse, uncomplicated: Secondary | ICD-10-CM

## 2021-12-11 ENCOUNTER — Other Ambulatory Visit: Payer: Self-pay | Admitting: Gastroenterology

## 2021-12-11 DIAGNOSIS — R1312 Dysphagia, oropharyngeal phase: Secondary | ICD-10-CM

## 2021-12-15 DIAGNOSIS — Z9181 History of falling: Secondary | ICD-10-CM | POA: Diagnosis not present

## 2021-12-15 DIAGNOSIS — R262 Difficulty in walking, not elsewhere classified: Secondary | ICD-10-CM | POA: Diagnosis not present

## 2021-12-15 DIAGNOSIS — R278 Other lack of coordination: Secondary | ICD-10-CM | POA: Diagnosis not present

## 2021-12-19 ENCOUNTER — Other Ambulatory Visit: Payer: Medicare HMO

## 2021-12-25 ENCOUNTER — Ambulatory Visit: Payer: Medicare HMO

## 2021-12-29 DIAGNOSIS — Z9181 History of falling: Secondary | ICD-10-CM | POA: Diagnosis not present

## 2021-12-29 DIAGNOSIS — R278 Other lack of coordination: Secondary | ICD-10-CM | POA: Diagnosis not present

## 2021-12-29 DIAGNOSIS — R262 Difficulty in walking, not elsewhere classified: Secondary | ICD-10-CM | POA: Diagnosis not present

## 2022-01-07 DIAGNOSIS — D649 Anemia, unspecified: Secondary | ICD-10-CM | POA: Diagnosis not present

## 2022-01-07 DIAGNOSIS — G629 Polyneuropathy, unspecified: Secondary | ICD-10-CM | POA: Diagnosis not present

## 2022-01-07 DIAGNOSIS — G40909 Epilepsy, unspecified, not intractable, without status epilepticus: Secondary | ICD-10-CM | POA: Diagnosis not present

## 2022-01-07 DIAGNOSIS — R69 Illness, unspecified: Secondary | ICD-10-CM | POA: Diagnosis not present

## 2022-01-07 DIAGNOSIS — E871 Hypo-osmolality and hyponatremia: Secondary | ICD-10-CM | POA: Diagnosis not present

## 2022-01-07 DIAGNOSIS — E7800 Pure hypercholesterolemia, unspecified: Secondary | ICD-10-CM | POA: Diagnosis not present

## 2022-01-07 DIAGNOSIS — Z6826 Body mass index (BMI) 26.0-26.9, adult: Secondary | ICD-10-CM | POA: Diagnosis not present

## 2022-01-08 ENCOUNTER — Ambulatory Visit
Admission: RE | Admit: 2022-01-08 | Discharge: 2022-01-08 | Disposition: A | Discharge status: Home / Self Care | Payer: Medicare HMO | Source: Ambulatory Visit | Attending: Gastroenterology | Admitting: Gastroenterology

## 2022-01-08 DIAGNOSIS — R1312 Dysphagia, oropharyngeal phase: Secondary | ICD-10-CM | POA: Insufficient documentation

## 2022-01-08 DIAGNOSIS — R1310 Dysphagia, unspecified: Secondary | ICD-10-CM | POA: Diagnosis not present

## 2022-01-08 NOTE — Progress Notes (Signed)
Modified Barium Swallow Progress Note  Patient Details  Name: KAMORI BARBIER MRN: [id number] Date of Birth: 10/26/1940  Today's Date: 01/08/2022  Modified Barium Swallow completed.  Full report located under Chart Review in the Imaging Section.  Brief recommendations include the following:  Clinical Impression  Pt presents with adequate oropharyngeal abilities when consuming thin liquids via spoon and cup; puree, whole barium tablet with thin liquids via cup. He continues with baseline oral hold of boluses but this behavior doesn't impede his swallow function or increase his aspiration risk. Of note, after the study (~ 1-2 minutes) pt began reporting a globus sensation and pointed to his mid throat. Fluro didn't reveal anything residing in his pharynx or upper esophagus. Education provided to pt's caregiver, [name].   Swallow Evaluation Recommendations       SLP Diet Recommendations: Regular solids;Thin liquid   Liquid Administration via: Cup   Medication Administration: Whole meds with liquid   Supervision: Patient able to self feed   Compensations: Minimize environmental distractions;Slow rate;Small sips/bites   Postural Changes: Seated upright at 90 degrees   Oral Care Recommendations: Oral care BID      Damier Disano B. Rutherford Nail, M.S., CCC-SLP, Mining engineer Certified Brain Injury [place]  [place] Office [phone] Ascom [phone] Fax [phone]

## 2022-01-12 DIAGNOSIS — R262 Difficulty in walking, not elsewhere classified: Secondary | ICD-10-CM | POA: Diagnosis not present

## 2022-01-12 DIAGNOSIS — Z9181 History of falling: Secondary | ICD-10-CM | POA: Diagnosis not present

## 2022-01-12 DIAGNOSIS — R278 Other lack of coordination: Secondary | ICD-10-CM | POA: Diagnosis not present

## 2022-01-14 DIAGNOSIS — R278 Other lack of coordination: Secondary | ICD-10-CM | POA: Diagnosis not present

## 2022-01-14 DIAGNOSIS — R262 Difficulty in walking, not elsewhere classified: Secondary | ICD-10-CM | POA: Diagnosis not present

## 2022-01-14 DIAGNOSIS — Z9181 History of falling: Secondary | ICD-10-CM | POA: Diagnosis not present

## 2022-01-22 DIAGNOSIS — R339 Retention of urine, unspecified: Secondary | ICD-10-CM | POA: Diagnosis not present

## 2022-01-22 DIAGNOSIS — E871 Hypo-osmolality and hyponatremia: Secondary | ICD-10-CM | POA: Diagnosis not present

## 2022-01-23 ENCOUNTER — Ambulatory Visit
Admission: RE | Admit: 2022-01-23 | Discharge: 2022-01-23 | Disposition: A | Discharge status: Home / Self Care | Payer: Medicare HMO | Source: Ambulatory Visit | Attending: Gastroenterology | Admitting: Gastroenterology

## 2022-01-23 DIAGNOSIS — K222 Esophageal obstruction: Secondary | ICD-10-CM | POA: Diagnosis not present

## 2022-01-23 DIAGNOSIS — R1013 Epigastric pain: Secondary | ICD-10-CM | POA: Insufficient documentation

## 2022-01-23 DIAGNOSIS — R1310 Dysphagia, unspecified: Secondary | ICD-10-CM | POA: Diagnosis not present

## 2022-01-23 DIAGNOSIS — R69 Illness, unspecified: Secondary | ICD-10-CM | POA: Diagnosis not present

## 2022-01-23 DIAGNOSIS — R1312 Dysphagia, oropharyngeal phase: Secondary | ICD-10-CM | POA: Diagnosis not present

## 2022-01-23 DIAGNOSIS — F1010 Alcohol abuse, uncomplicated: Secondary | ICD-10-CM | POA: Diagnosis present

## 2022-02-02 DIAGNOSIS — S59901A Unspecified injury of right elbow, initial encounter: Secondary | ICD-10-CM | POA: Diagnosis not present

## 2022-02-02 DIAGNOSIS — Z7189 Other specified counseling: Secondary | ICD-10-CM | POA: Diagnosis not present

## 2022-02-02 DIAGNOSIS — M25551 Pain in right hip: Secondary | ICD-10-CM | POA: Diagnosis not present

## 2022-02-09 DIAGNOSIS — Z8546 Personal history of malignant neoplasm of prostate: Secondary | ICD-10-CM | POA: Diagnosis not present

## 2022-02-09 DIAGNOSIS — N35919 Unspecified urethral stricture, male, unspecified site: Secondary | ICD-10-CM | POA: Diagnosis not present

## 2022-02-10 DIAGNOSIS — M25521 Pain in right elbow: Secondary | ICD-10-CM | POA: Diagnosis not present

## 2022-02-10 DIAGNOSIS — R339 Retention of urine, unspecified: Secondary | ICD-10-CM | POA: Diagnosis not present

## 2022-02-10 DIAGNOSIS — S50311A Abrasion of right elbow, initial encounter: Secondary | ICD-10-CM | POA: Diagnosis not present

## 2022-02-10 DIAGNOSIS — S5001XA Contusion of right elbow, initial encounter: Secondary | ICD-10-CM | POA: Diagnosis not present

## 2022-02-11 DIAGNOSIS — E871 Hypo-osmolality and hyponatremia: Secondary | ICD-10-CM | POA: Diagnosis not present

## 2022-02-28 ENCOUNTER — Emergency Department (HOSPITAL_COMMUNITY): Payer: Medicare HMO

## 2022-02-28 ENCOUNTER — Other Ambulatory Visit: Payer: Self-pay

## 2022-02-28 ENCOUNTER — Encounter (HOSPITAL_COMMUNITY): Payer: Self-pay | Admitting: Internal Medicine

## 2022-02-28 ENCOUNTER — Observation Stay (HOSPITAL_COMMUNITY)
Admission: EM | Admit: 2022-02-28 | Discharge: 2022-03-02 | Disposition: A | Discharge status: Home / Self Care | Payer: Medicare HMO | Attending: Internal Medicine | Admitting: Internal Medicine

## 2022-02-28 DIAGNOSIS — F32A Depression, unspecified: Secondary | ICD-10-CM | POA: Diagnosis not present

## 2022-02-28 DIAGNOSIS — R400 Somnolence: Secondary | ICD-10-CM | POA: Diagnosis not present

## 2022-02-28 DIAGNOSIS — G40909 Epilepsy, unspecified, not intractable, without status epilepticus: Secondary | ICD-10-CM

## 2022-02-28 DIAGNOSIS — I63312 Cerebral infarction due to thrombosis of left middle cerebral artery: Secondary | ICD-10-CM

## 2022-02-28 DIAGNOSIS — G9341 Metabolic encephalopathy: Secondary | ICD-10-CM | POA: Diagnosis not present

## 2022-02-28 DIAGNOSIS — I509 Heart failure, unspecified: Secondary | ICD-10-CM | POA: Insufficient documentation

## 2022-02-28 DIAGNOSIS — R531 Weakness: Secondary | ICD-10-CM | POA: Diagnosis not present

## 2022-02-28 DIAGNOSIS — Z8546 Personal history of malignant neoplasm of prostate: Secondary | ICD-10-CM | POA: Diagnosis not present

## 2022-02-28 DIAGNOSIS — I110 Hypertensive heart disease with heart failure: Secondary | ICD-10-CM | POA: Diagnosis not present

## 2022-02-28 DIAGNOSIS — R2681 Unsteadiness on feet: Secondary | ICD-10-CM | POA: Insufficient documentation

## 2022-02-28 DIAGNOSIS — Z789 Other specified health status: Secondary | ICD-10-CM | POA: Diagnosis present

## 2022-02-28 DIAGNOSIS — Z79899 Other long term (current) drug therapy: Secondary | ICD-10-CM | POA: Diagnosis not present

## 2022-02-28 DIAGNOSIS — I720 Aneurysm of carotid artery: Secondary | ICD-10-CM | POA: Diagnosis not present

## 2022-02-28 DIAGNOSIS — E871 Hypo-osmolality and hyponatremia: Principal | ICD-10-CM | POA: Diagnosis present

## 2022-02-28 DIAGNOSIS — Z87891 Personal history of nicotine dependence: Secondary | ICD-10-CM | POA: Insufficient documentation

## 2022-02-28 DIAGNOSIS — G459 Transient cerebral ischemic attack, unspecified: Secondary | ICD-10-CM | POA: Diagnosis not present

## 2022-02-28 DIAGNOSIS — F1090 Alcohol use, unspecified, uncomplicated: Secondary | ICD-10-CM | POA: Diagnosis present

## 2022-02-28 DIAGNOSIS — Z720 Tobacco use: Secondary | ICD-10-CM | POA: Diagnosis present

## 2022-02-28 DIAGNOSIS — R69 Illness, unspecified: Secondary | ICD-10-CM | POA: Diagnosis not present

## 2022-02-28 DIAGNOSIS — Z743 Need for continuous supervision: Secondary | ICD-10-CM | POA: Diagnosis not present

## 2022-02-28 HISTORY — DX: Epilepsy, unspecified, not intractable, without status epilepticus: G40.909

## 2022-02-28 LAB — I-STAT CHEM 8, ED
BUN: 10 mg/dL (ref 8–23)
Calcium, Ion: 1.17 mmol/L (ref 1.15–1.40)
Chloride: 88 mmol/L — ABNORMAL LOW (ref 98–111)
Creatinine, Ser: 0.8 mg/dL (ref 0.61–1.24)
Glucose, Bld: 92 mg/dL (ref 70–99)
HCT: 34 % — ABNORMAL LOW (ref 39.0–52.0)
Hemoglobin: 11.6 g/dL — ABNORMAL LOW (ref 13.0–17.0)
Potassium: 4.4 mmol/L (ref 3.5–5.1)
Sodium: 124 mmol/L — ABNORMAL LOW (ref 135–145)
TCO2: 26 mmol/L (ref 22–32)

## 2022-02-28 LAB — COMPREHENSIVE METABOLIC PANEL
ALT: 10 U/L (ref 0–44)
AST: 21 U/L (ref 15–41)
Albumin: 4.2 g/dL (ref 3.5–5.0)
Alkaline Phosphatase: 62 U/L (ref 38–126)
Anion gap: 8 (ref 5–15)
BUN: 11 mg/dL (ref 8–23)
CO2: 27 mmol/L (ref 22–32)
Calcium: 9.3 mg/dL (ref 8.9–10.3)
Chloride: 90 mmol/L — ABNORMAL LOW (ref 98–111)
Creatinine, Ser: 0.8 mg/dL (ref 0.61–1.24)
GFR, Estimated: >60 mL/min (ref >60)
Glucose, Bld: 100 mg/dL — ABNORMAL HIGH (ref 70–99)
Potassium: 5.2 mmol/L — ABNORMAL HIGH (ref 3.5–5.1)
Sodium: 125 mmol/L — ABNORMAL LOW (ref 135–145)
Total Bilirubin: 1 mg/dL (ref 0.3–1.2)
Total Protein: 7.8 g/dL (ref 6.5–8.1)

## 2022-02-28 LAB — CBC
HCT: 35.5 % — ABNORMAL LOW (ref 39.0–52.0)
Hemoglobin: 12.7 g/dL — ABNORMAL LOW (ref 13.0–17.0)
MCH: 33.1 pg (ref 26.0–34.0)
MCHC: 35.8 g/dL (ref 30.0–36.0)
MCV: 92.4 fL (ref 80.0–100.0)
Platelets: 249 10*3/uL (ref 150–400)
RBC: 3.84 MIL/uL — ABNORMAL LOW (ref 4.22–5.81)
RDW: 12 % (ref 11.5–15.5)
WBC: 7.7 10*3/uL (ref 4.0–10.5)
nRBC: 0 % (ref 0.0–0.2)

## 2022-02-28 LAB — URINALYSIS, ROUTINE W REFLEX MICROSCOPIC
Bacteria, UA: NONE SEEN
Bilirubin Urine: NEGATIVE
Glucose, UA: NEGATIVE mg/dL
Hgb urine dipstick: NEGATIVE
Ketones, ur: NEGATIVE mg/dL
Nitrite: NEGATIVE
Protein, ur: NEGATIVE mg/dL
Specific Gravity, Urine: 1.011 (ref 1.005–1.030)
pH: 7 (ref 5.0–8.0)

## 2022-02-28 LAB — LACTIC ACID, PLASMA: Lactic Acid, Venous: 1.1 mmol/L (ref 0.5–1.9)

## 2022-02-28 LAB — SODIUM
Sodium: 125 mmol/L — ABNORMAL LOW (ref 135–145)
Sodium: 126 mmol/L — ABNORMAL LOW (ref 135–145)

## 2022-02-28 LAB — AMMONIA: Ammonia: 17 umol/L (ref 9–35)

## 2022-02-28 LAB — CBG MONITORING, ED: Glucose-Capillary: 94 mg/dL (ref 70–99)

## 2022-02-28 LAB — MAGNESIUM: Magnesium: 1.7 mg/dL (ref 1.7–2.4)

## 2022-02-28 LAB — ETHANOL: Alcohol, Ethyl (B): <10 mg/dL (ref <10)

## 2022-02-28 MED ORDER — LEVETIRACETAM 500 MG PO TABS
500.0000 mg | ORAL_TABLET | Freq: Two times a day (BID) | ORAL | Status: DC
  Administered 2022-02-28 – 2022-03-02 (×4): 500 mg via ORAL
  Filled 2022-02-28 (×4): qty 1

## 2022-02-28 MED ORDER — ACETAMINOPHEN 325 MG PO TABS: 650.0000 mg | ORAL_TABLET | Freq: Four times a day (QID) | ORAL | Status: DC | PRN

## 2022-02-28 MED ORDER — THIAMINE HCL 100 MG/ML IJ SOLN: 100.0000 mg | Freq: Every day | INTRAMUSCULAR | Status: DC

## 2022-02-28 MED ORDER — THIAMINE MONONITRATE 100 MG PO TABS
100.0000 mg | ORAL_TABLET | Freq: Every day | ORAL | Status: DC
  Administered 2022-02-28 – 2022-03-02 (×3): 100 mg via ORAL
  Filled 2022-02-28 (×3): qty 1

## 2022-02-28 MED ORDER — SODIUM CHLORIDE 0.9 % IV SOLN: INTRAVENOUS | Status: DC

## 2022-02-28 MED ORDER — FOLIC ACID 1 MG PO TABS
1.0000 mg | ORAL_TABLET | Freq: Every day | ORAL | Status: DC
  Administered 2022-02-28 – 2022-03-02 (×3): 1 mg via ORAL
  Filled 2022-02-28 (×3): qty 1

## 2022-02-28 MED ORDER — SODIUM CHLORIDE 0.9% FLUSH
3.0000 mL | Freq: Two times a day (BID) | INTRAVENOUS | Status: DC
  Administered 2022-03-01 – 2022-03-02 (×2): 3 mL via INTRAVENOUS

## 2022-02-28 MED ORDER — SENNOSIDES-DOCUSATE SODIUM 8.6-50 MG PO TABS: 1.0000 | ORAL_TABLET | Freq: Every evening | ORAL | Status: DC | PRN

## 2022-02-28 MED ORDER — NICOTINE 21 MG/24HR TD PT24
21.0000 mg | MEDICATED_PATCH | Freq: Every day | TRANSDERMAL | Status: DC
  Administered 2022-02-28 – 2022-03-02 (×3): 21 mg via TRANSDERMAL
  Filled 2022-02-28 (×3): qty 1

## 2022-02-28 MED ORDER — PANTOPRAZOLE SODIUM 40 MG PO TBEC
40.0000 mg | DELAYED_RELEASE_TABLET | Freq: Every day | ORAL | Status: DC
  Administered 2022-03-01 – 2022-03-02 (×2): 40 mg via ORAL
  Filled 2022-02-28 (×2): qty 1

## 2022-02-28 MED ORDER — ENOXAPARIN SODIUM 40 MG/0.4ML IJ SOSY
40.0000 mg | PREFILLED_SYRINGE | INTRAMUSCULAR | Status: DC
  Administered 2022-02-28 – 2022-03-01 (×2): 40 mg via SUBCUTANEOUS
  Filled 2022-02-28 (×2): qty 0.4

## 2022-02-28 MED ORDER — LACTATED RINGERS IV BOLUS: 1000.0000 mL | Freq: Once | INTRAVENOUS | Status: DC

## 2022-02-28 MED ORDER — ACETAMINOPHEN 650 MG RE SUPP: 650.0000 mg | Freq: Four times a day (QID) | RECTAL | Status: DC | PRN

## 2022-02-28 MED ORDER — GABAPENTIN 300 MG PO CAPS
300.0000 mg | ORAL_CAPSULE | Freq: Every day | ORAL | Status: DC
  Administered 2022-02-28 – 2022-03-01 (×2): 300 mg via ORAL
  Filled 2022-02-28 (×2): qty 1

## 2022-02-28 MED ORDER — SODIUM CHLORIDE 0.9 % IV BOLUS
1000.0000 mL | Freq: Once | INTRAVENOUS | Status: AC
  Administered 2022-02-28: 1000 mL via INTRAVENOUS

## 2022-02-28 MED ORDER — ADULT MULTIVITAMIN W/MINERALS CH
1.0000 | ORAL_TABLET | Freq: Every day | ORAL | Status: DC
  Administered 2022-02-28 – 2022-03-02 (×3): 1 via ORAL
  Filled 2022-02-28 (×3): qty 1

## 2022-02-28 MED ORDER — LORAZEPAM 1 MG PO TABS: 1.0000 mg | ORAL_TABLET | ORAL | Status: DC | PRN

## 2022-02-28 MED ORDER — LORAZEPAM 2 MG/ML IJ SOLN: 1.0000 mg | INTRAMUSCULAR | Status: DC | PRN

## 2022-02-28 NOTE — Assessment & Plan Note (Signed)
Holding Lexapro as may be contributing to hyponatremia.

## 2022-02-28 NOTE — Assessment & Plan Note (Addendum)
Patient with chronic hyponatremia 130-133 in the setting of chronic alcohol use.  Sodium 125 on admit.  May also have some component of medication effect from Lexapro as well as hypovolemia related to diminish oral intake.  Brought to ED due to concern for some confusion which has now resolved. -Start NS'@75'$  mLs/hour -Repeat sodium tonight and adjust fluids as needed -Hold Lexapro -Check urine sodium and osmolality, serum osmolality

## 2022-02-28 NOTE — Assessment & Plan Note (Signed)
Smoking almost 1 pack/day.  Nicotine patch ordered.

## 2022-02-28 NOTE — Hospital Course (Signed)
Carlos Wise is a 81 y.o. male with medical history significant for prostate cancer s/p XRT, HTN, esophageal dysphagia/stricture, right ICA aneurysm s/p coiling, seizure disorder, chronic hyponatremia, tobacco use, chronic daily alcohol use who is admitted with acute on chronic hyponatremia.

## 2022-02-28 NOTE — Assessment & Plan Note (Signed)
Drinks 3 ounces of gin every night.  Last drink 9/15 p.m.  He has no interest in cutting back or quitting. -Placed on CIWA protocol with Ativan if needed -Continue thiamine, folate, MVM

## 2022-02-28 NOTE — Assessment & Plan Note (Signed)
Continue Keppra 500 mg twice daily 

## 2022-02-28 NOTE — H&P (Signed)
History and Physical    OBI SCRIMA FXT:[id number] DOB: 17-Jul-1940 DOA: 02/28/2022  PCP: [name], MD  Patient coming from: Home via EMS  I have personally briefly reviewed patient's old medical records in [facility]  Chief Complaint: Weakness, altered mental status  HPI: Carlos Wise is a 81 y.o. male with medical history significant for prostate cancer s/p XRT, HTN, esophageal dysphagia/stricture, right ICA aneurysm s/p coiling, seizure disorder, chronic hyponatremia, tobacco use, chronic daily alcohol use who presented to the ED for evaluation of altered mental status and weakness.  History limited from patient due to suspected underlying cognitive impairment and is supplemented by EDP, chart review, and caregiver at bedside.  Patient currently lives at home but has a caregiver and 24-hour nursing assistance.  He is a chronic daily drinker of 3 ounces of gin every night with last drink last night at 9 PM.  Caregiver states that last night his nurses noted that he seemed to be somewhat off his mental baseline.  He appeared to be slow in responses and required assistance getting into bed last night.  This morning he also required assistance with transfers and ambulation.  This is a new change as he normally ambulates with a walker.  He was noted to have transient right leg weakness and seem to be leaning towards right while sitting.  He did eat his breakfast and take his salt tablets this morning.  Caregiver notes that his appetite has seemed low and he has not been eating as much recently.  He did have a recent UGI study which showed mild stricture of the proximal esophagus.  He was planned for EGD for possible dilation on 03/23/2022.  ED Course  Labs/Imaging on admission: I have personally reviewed following labs and imaging studies.  Initial vitals showed BP 143/82, pulse 72, RR 13, temp 99.0 F, SPO2 98% on room air.  Labs show sodium 125 (baseline 130-133), potassium 5.2,  chloride 90, bicarb 27, BUN 11, creatinine 0.0, serum glucose 100, LFTs within normal limits, WBC 7.7, hemoglobin 12.7, platelets 249,000, ammonia 17 magnesium 1.7.  Urinalysis negative for UTI.  CT head without contrast negative for acute intracranial process.  Stable chronic small vessel ischemic changes throughout the white matter noted.  MRI brain without contrast ordered and pending.  Patient was given 1 L normal saline.  The hospitalist service was consulted to admit for further evaluation and management.  Review of Systems: All systems reviewed and are negative except as documented in history of present illness above.   Past Medical History:  Diagnosis Date   Herpes zoster    Hyperlipemia    Hypertension    Intestinal polyp    hyperplastic   Prostate cancer ([place])    S/P radiation therapy 12/12/2014 through 02/06/2015                                                      Prostate 7800 cGy in 40 sessions, seminal vesicles 5600 cGy in 40 sessions                           Seizure disorder [facility])     Past Surgical History:  Procedure Laterality Date   Carotid Artery Catherization     PROSTATE BIOPSY  06/27/2014  Social History:  reports that he has quit smoking. He has never used smokeless tobacco. He reports current alcohol use. He reports that he does not use drugs.  Allergies  Allergen Reactions   Penicillins Other (See Comments)    Patient states no allergy, but has preference that he does not want this medication    Family History  Problem Relation Age of Onset   Colon cancer Mother    Prostate cancer Father    Heart attack Father      Prior to Admission medications   Medication Sig Start Date End Date Taking? Authorizing Provider  bismuth subsalicylate (PEPTO BISMOL) 262 MG/15ML suspension Take 30 mLs by mouth every 6 (six) hours as needed for indigestion.    [provider]  cholecalciferol (VITAMIN D3) 25 MCG (1000 UNIT) tablet Take 1,000 Units by  mouth daily.    [provider]  clobetasol (TEMOVATE) 0.05 % external solution Apply 1 application  topically 2 (two) times daily as needed for itching. 10/13/21   [provider]  Cyanocobalamin (VITAMIN B-12 ER PO) Take 1,000 mcg by mouth daily.    [provider]  escitalopram (LEXAPRO) 20 MG tablet Take 20 mg by mouth daily. 02/03/20   [provider]  fluticasone (FLONASE) 50 MCG/ACT nasal spray Place 2 sprays into both nostrils daily as needed for allergies or rhinitis.  12/10/19   [provider]  ketotifen (ALLERGY EYE DROPS) 0.025 % ophthalmic solution Place 1 drop into both eyes daily as needed (red eyes).    [provider]  levETIRAcetam (KEPPRA) 500 MG tablet Take 1 tablet (500 mg total) by mouth 2 (two) times daily. 11/28/21 12/28/21  Leslee Home, DO  loratadine (CLARITIN) 10 MG tablet Take 10 mg by mouth daily.    [provider]  mirabegron ER (MYRBETRIQ) 25 MG TB24 tablet Take 25 mg by mouth daily.    [provider]  nicotine (NICODERM CQ - DOSED IN MG/24 HOURS) 14 mg/24hr patch Place 1 patch (14 mg total) onto the skin daily. 11/29/21   Leslee Home, DO  pantoprazole (PROTONIX) 40 MG tablet Take 40 mg by mouth daily before breakfast.     [provider]  polyethylene glycol (MIRALAX / GLYCOLAX) 17 g packet Take 17 g by mouth daily as needed for severe constipation. 02/09/20   Hosie Poisson, MD  sodium chloride (OCEAN) 0.65 % SOLN nasal spray Place 1 spray into both nostrils daily as needed for congestion.    [provider]  sodium chloride 1 g tablet Take 1 g by mouth daily.    [provider]  triamcinolone cream (KENALOG) 0.1 % Apply 1 application  topically 2 (two) times daily as needed for skin breakdown. 10/13/21   [provider]    Physical Exam: Vitals:   02/28/22 1620 02/28/22 1630 02/28/22 1631 02/28/22 1730  BP:  (!) 143/82  (!) 158/87  Pulse:  72  70  Resp:   13  15  Temp:  99 F (37.2 C)    TempSrc:  Oral    SpO2: 95% 98%  98%  Weight:   78.5 kg   Height:   '5\' 8"'$  (1.727 m)    Constitutional: Sitting up in wheelchair, NAD, calm, comfortable Eyes: EOMI, lids and conjunctivae normal ENMT: Mucous membranes are dry. Posterior pharynx clear of any exudate or lesions.Normal dentition.  Neck: normal, supple, no masses. Respiratory: clear to auscultation bilaterally, no wheezing, no crackles. Normal respiratory effort. No  accessory muscle use.  Cardiovascular: Regular rate and rhythm, no murmurs / rubs / gallops. No extremity edema. 2+ pedal pulses. Abdomen: no tenderness, no masses palpated.  Musculoskeletal: no clubbing / cyanosis. No joint deformity upper and lower extremities. Good ROM, no contractures. Normal muscle tone.  Skin: no rashes, lesions, ulcers. No induration Neurologic: Sensation intact. Strength 5/5 in all 4.  Psychiatric: Alert and oriented to self, year, place but not situation.  EKG: Personally reviewed. Normal sinus rhythm, rate 72, no acute ischemic changes.   Assessment/Plan Principal Problem:   Acute on chronic hyponatremia Active Problems:   Acute metabolic encephalopathy   Alcohol use   Seizure disorder ([place])   Tobacco use   Depression   JOHNNIE GOYNES is a 81 y.o. male with medical history significant for prostate cancer s/p XRT, HTN, esophageal dysphagia/stricture, right ICA aneurysm s/p coiling, seizure disorder, chronic hyponatremia, tobacco use, chronic daily alcohol use who is admitted with acute on chronic hyponatremia.  Assessment and Plan: * Acute on chronic hyponatremia Patient with chronic hyponatremia 130-133 in the setting of chronic alcohol use.  Sodium 125 on admit.  May also have some component of medication effect from Lexapro as well as hypovolemia related to diminish oral intake.  Brought to ED due to concern for some confusion which has now resolved. -Start NS'@75'$  mLs/hour -Repeat sodium tonight  and adjust fluids as needed -Hold Lexapro -Check urine sodium and osmolality, serum osmolality  Acute metabolic encephalopathy Caregiver reported transient confusion/change in mental status as well as right leg weakness.  Appears to now have returned to baseline.  Likely secondary to acute on chronic hyponatremia.  Strength is equal throughout.  Question some underlying cognitive impairment. -CT head negative for acute changes -Follow MRI brain, if negative would not pursue further CVA work-up at this time -Continue neurochecks -PT/OT eval  Depression Holding Lexapro as may be contributing to hyponatremia.  Tobacco use Smoking almost 1 pack/day.  Nicotine patch ordered.  Seizure disorder ([facility]) Continue Keppra 500 mg twice daily.  Alcohol use Drinks 3 ounces of gin every night.  Last drink 9/15 p.m.  He has no interest in cutting back or quitting. -Placed on CIWA protocol with Ativan if needed -Continue thiamine, folate, MVM  DVT prophylaxis: enoxaparin (LOVENOX) injection 40 mg Start: 02/28/22 2000 Code Status: Full code, discussed with HCPOA on admit Family Communication: HCPOA, [name] at bedside Disposition Plan: From home, dispo pending clinical progress Consults called: None Severity of Illness: The appropriate patient status for this patient is OBSERVATION. Observation status is judged to be reasonable and necessary in order to provide the required intensity of service to ensure the patient's safety. The patient's presenting symptoms, physical exam findings, and initial radiographic and laboratory data in the context of their medical condition is felt to place them at decreased risk for further clinical deterioration. Furthermore, it is anticipated that the patient will be medically stable for discharge from the hospital within 2 midnights of admission.   Zada Finders MD [facility]  If 7PM-7AM, please contact night-coverage [email or web address]  02/28/2022, 8:09  PM

## 2022-02-28 NOTE — ED Triage Notes (Signed)
Pt BIBA from home. EMS called by family d/t concern for weakness from possible stroke or "low sodium". Pt Aox4, pt states they feel lightly weaker on R side beginning today. No sensation/strength loss noted in exam.  BP: 152/84 HR: 83 SPO2: 95 RR: 16 CBG: 122

## 2022-02-28 NOTE — ED Provider Notes (Signed)
[facility] DEPT Provider Note   CSN: [id number] Arrival date & time: 02/28/22  1558     History  Chief Complaint  Patient presents with   Weakness    Carlos Wise is a 81 y.o. male.   Weakness Patient presents for altered mental status and generalized weakness.  Medical history includes prostate CA, alcohol use, HTN, HLD, CHF.  Patient reports that he currently lives alone.  His family reportedly had concerns earlier today.  Patient is unsure of what their concerns were.  He does state that he has had hyponatremia in the past that led to seizures.  Patient does currently drink alcohol daily.  He reports that his typical daily intake is 3 ounces of gin.  His last drink was 9 PM last night.  Patient currently states that he feels hungry and denies any other symptoms.  History per caregiver: Patient had a hospitalization in June for hyponatremia and altered mental status.  Per chart review, at that time sodium was 126.  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  Yesterday, he was noted to be off of his mental baseline.  This was very subtle and he seemed to have some slowed responses.  He required assistance getting into bed last night.  This morning, he also required assistance with transfers and ambulation.  This is not normal for him.  He did eat his breakfast and he did take his salt supplements this morning.  Other caregivers at the house noticed that he seemed to be slightly more weak on the right side and leaning to the right.  He was at this point, the decision was made to bring him to the ED.       Home Medications Prior to Admission medications   Medication Sig Start Date End Date Taking? Authorizing Provider  bismuth subsalicylate (PEPTO BISMOL) 262 MG/15ML suspension Take 30 mLs by mouth every 6 (six) hours as needed for indigestion.    [provider]  cholecalciferol (VITAMIN D3) 25 MCG  (1000 UNIT) tablet Take 1,000 Units by mouth daily.    [provider]  clobetasol (TEMOVATE) 0.05 % external solution Apply 1 application  topically 2 (two) times daily as needed for itching. 10/13/21   [provider]  Cyanocobalamin (VITAMIN B-12 ER PO) Take 1,000 mcg by mouth daily.    [provider]  escitalopram (LEXAPRO) 20 MG tablet Take 20 mg by mouth daily. 02/03/20   [provider]  fluticasone (FLONASE) 50 MCG/ACT nasal spray Place 2 sprays into both nostrils daily as needed for allergies or rhinitis.  12/10/19   [provider]  ketotifen (ALLERGY EYE DROPS) 0.025 % ophthalmic solution Place 1 drop into both eyes daily as needed (red eyes).    [provider]  levETIRAcetam (KEPPRA) 500 MG tablet Take 1 tablet (500 mg total) by mouth 2 (two) times daily. 11/28/21 12/28/21  Leslee Home, DO  loratadine (CLARITIN) 10 MG tablet Take 10 mg by mouth daily.    [provider]  mirabegron ER (MYRBETRIQ) 25 MG TB24 tablet Take 25 mg by mouth daily.    [provider]  nicotine (NICODERM CQ - DOSED IN MG/24 HOURS) 14 mg/24hr patch Place 1 patch (14 mg total) onto the skin daily. 11/29/21   Leslee Home, DO  pantoprazole (PROTONIX) 40 MG tablet Take 40 mg by mouth daily before breakfast.     [provider]  polyethylene glycol (MIRALAX / GLYCOLAX) 17 g packet Take 17 g by mouth daily as needed for severe constipation. 02/09/20   Hosie Poisson, MD  sodium chloride (OCEAN) 0.65 % SOLN nasal spray Place 1 spray into both nostrils daily as needed for congestion.    [provider]  sodium chloride 1 g tablet Take 1 g by mouth daily.    [provider]  triamcinolone cream (KENALOG) 0.1 % Apply 1 application  topically 2 (two) times daily as needed for skin breakdown. 10/13/21   [provider]      Allergies    Penicillins    Review of Systems   Review of Systems  Neurological:  Positive  for weakness (Generalized).  Psychiatric/Behavioral:  Positive for confusion.   All other systems reviewed and are negative.   Physical Exam Updated Vital Signs BP (!) 158/87 (BP Location: Left Arm)   Pulse 70   Temp 99 F (37.2 C) (Oral)   Resp 15   Ht '5\' 8"'$  (1.727 m)   Wt 78.5 kg   SpO2 98%   BMI 26.30 kg/m  Physical Exam Vitals and nursing note reviewed.  Constitutional:      General: He is not in acute distress.    Appearance: Normal appearance. He is well-developed. He is not ill-appearing, toxic-appearing or diaphoretic.  HENT:     Head: Normocephalic and atraumatic.     Right Ear: External ear normal.     Left Ear: External ear normal.     Nose: Nose normal.     Mouth/Throat:     Mouth: Mucous membranes are moist.     Pharynx: Oropharynx is clear.  Eyes:     Extraocular Movements: Extraocular movements intact.     Conjunctiva/sclera: Conjunctivae normal.  Cardiovascular:     Rate and Rhythm: Normal rate and regular rhythm.     Heart sounds: No murmur heard. Pulmonary:     Effort: Pulmonary effort is normal. No respiratory distress.     Breath sounds: Normal breath sounds. No wheezing or rales.  Chest:     Chest wall: No tenderness.  Abdominal:     General: There is no distension.     Palpations: Abdomen is soft.     Tenderness: There is no abdominal tenderness.  Musculoskeletal:        General: No swelling. Normal range of motion.     Cervical back: Normal range of motion and neck supple. No rigidity.     Right lower leg: No edema.     Left lower leg: No edema.  Skin:    General: Skin is warm and dry.     Capillary Refill: Capillary refill takes less than 2 seconds.     Coloration: Skin is not jaundiced or pale.  Neurological:     General: No focal deficit present.     Mental Status: He is alert and oriented to person, place, and time.     Cranial Nerves: Cranial nerves 2-12 are intact. No cranial nerve deficit, dysarthria or facial asymmetry.      Sensory: Sensation is intact. No sensory deficit.     Motor: Motor function is intact. No weakness, abnormal muscle tone or pronator drift.     Coordination: Coordination is intact. Coordination normal. Finger-Nose-Finger Test normal.  Psychiatric:        Mood and Affect: Mood and affect normal.        Speech: Speech is delayed.        Behavior: Behavior is  slowed. Behavior is cooperative.        Thought Content: Thought content normal.     ED Results / Procedures / Treatments   Labs (all labs ordered are listed, but only abnormal results are displayed) Labs Reviewed  CBC - Abnormal; Notable for the following components:      Result Value   RBC 3.84 (*)    Hemoglobin 12.7 (*)    HCT 35.5 (*)    All other components within normal limits  COMPREHENSIVE METABOLIC PANEL - Abnormal; Notable for the following components:   Sodium 125 (*)    Potassium 5.2 (*)    Chloride 90 (*)    Glucose, Bld 100 (*)    All other components within normal limits  URINALYSIS, ROUTINE W REFLEX MICROSCOPIC - Abnormal; Notable for the following components:   APPearance HAZY (*)    Leukocytes,Ua MODERATE (*)    All other components within normal limits  I-STAT CHEM 8, ED - Abnormal; Notable for the following components:   Sodium 124 (*)    Chloride 88 (*)    Hemoglobin 11.6 (*)    HCT 34.0 (*)    All other components within normal limits  URINE CULTURE  MAGNESIUM  AMMONIA  LACTIC ACID, PLASMA  ETHANOL  CBG MONITORING, ED    EKG None  Radiology CT HEAD WO CONTRAST  Result Date: 02/28/2022 CLINICAL DATA:  Weakness, altered level of consciousness EXAM: CT HEAD WITHOUT CONTRAST TECHNIQUE: Contiguous axial images were obtained from the base of the skull through the vertex without intravenous contrast. RADIATION DOSE REDUCTION: This exam was performed according to the departmental dose-optimization program which includes automated exposure control, adjustment of the mA and/or kV according to patient  size and/or use of iterative reconstruction technique. COMPARISON:  11/22/2021, 11/23/2021 FINDINGS: Brain: Stable hypodensities throughout the periventricular white matter consistent with chronic small vessel ischemic change. No signs of acute infarct or hemorrhage. Lateral ventricles and midline structures are grossly unremarkable. No acute extra-axial fluid collections. No mass effect. Vascular: Likely stent in the right ICA distribution. Stable atherosclerosis. No hyperdense vessel. Skull: Normal. Negative for fracture or focal lesion. Sinuses/Orbits: No acute finding. Other: None. IMPRESSION: 1. No acute intracranial process. Stable chronic small-vessel ischemic changes throughout the white matter. Electronically Signed   By: Randa Ngo M.D.   On: 02/28/2022 17:23    Procedures Procedures    Medications Ordered in ED Medications  sodium chloride 0.9 % bolus 1,000 mL (1,000 mLs Intravenous New Bag/Given 02/28/22 1745)    ED Course/ Medical Decision Making/ A&P                           Medical Decision Making Amount and/or Complexity of Data Reviewed Labs: ordered. Radiology: ordered.   This patient presents to the ED for concern of altered mental status, weakness, this involves an extensive number of treatment options, and is a complaint that carries with it a high risk of complications and morbidity.  The differential diagnosis includes CVA, TIA, hyponatremia, alcohol withdrawal, dehydration, infection, hypoglycemia, neoplasm, TBI   Co morbidities that complicate the patient evaluation  prostate CA, alcohol use, HTN, HLD, CHF   Additional history obtained:  Additional history obtained from patient's caregiver/power of attorney External records from outside source obtained and reviewed including EMR   Lab Tests:  I Ordered, and personally interpreted labs.  The pertinent results include: Acute on chronic hyponatremia with sodium of 125.  Patient also has  mild hypokalemia  and hypochloremia.  Hemoglobin is baseline and no leukocytosis is present.  Urinalysis is equivocal.   Imaging Studies ordered:  I ordered imaging studies including CT head I independently visualized and interpreted imaging which showed no acute findings I agree with the radiologist interpretation   Cardiac Monitoring: / EKG:  The patient was maintained on a cardiac monitor.  I personally viewed and interpreted the cardiac monitored which showed an underlying rhythm of: Sinus rhythm  Problem List / ED Course / Critical interventions / Medication management  Patient is a 81 year old male who presents from home for altered mental status and weakness.  Although he lives alone, he does have frequent caregiver support.  History is provided by both patient and his primary caregiver/power of attorney.  Patient was reportedly in his normal state of health yesterday.  Last night, he was noted to have some slowed responses to speech.  He required assistance to physically get into his bed which is not normal for him.  The symptoms of mild confusion, slowed responses, and physical weakness also continued today and there was concern that he was more weak on the right side than the left.  On arrival in the ED, patient has no focal neurologic deficits.  He does remain slowed in his responses to questions.  He does seem to have some short-term memory loss and is unclear of why he was brought to the ED.  He states that he does continue to drink and states that his typical consumption is 3 ounces of gin per night.  His last glass of gin was 9 PM.  Patient has no asterixis, no tremor, and normal vital signs.  I do not suspect that he is withdrawing from alcohol at this time.  Given his history, I have high suspicion of hyponatremia.  Given the caregiver concern of focal weakness this morning, will also evaluate for TIA.  CT of head did not show any acute findings.  Lab work did show hyponatremia with a sodium of 125.   Patient was given normal saline bolus of IV fluid.  He was joined at bedside by his primary caregiver.  She does state that he seems to be back to his normal self.  Given that he had worsening symptoms this morning prior to taking his salt supplementation, I do suspect that patient's sodium was lower than what was measured on his arrival in the ED.  Patient to be admitted for symptomatic hyponatremia. I ordered medication including IV fluid for hydration Reevaluation of the patient after these medicines showed that the patient improved I have reviewed the patients home medicines and have made adjustments as needed   Social Determinants of Health:  Lives at home with caregiver support   CRITICAL CARE Performed by: Godfrey Pick   Total critical care time: 35 minutes  Critical care time was exclusive of separately billable procedures and treating other patients.  Critical care was necessary to treat or prevent imminent or life-threatening deterioration.  Critical care was time spent personally by me on the following activities: development of treatment plan with patient and/or surrogate as well as nursing, discussions with consultants, evaluation of patient's response to treatment, examination of patient, obtaining history from patient or surrogate, ordering and performing treatments and interventions, ordering and review of laboratory studies, ordering and review of radiographic studies, pulse oximetry and re-evaluation of patient's condition.        Final Clinical Impression(s) / ED Diagnoses Final diagnoses:  Hyponatremia  Rx / DC Orders ED Discharge Orders     None         Godfrey Pick, MD 02/28/22 [phone]

## 2022-02-28 NOTE — Assessment & Plan Note (Addendum)
Caregiver reported transient confusion/change in mental status as well as right leg weakness.  Appears to now have returned to baseline.  Likely secondary to acute on chronic hyponatremia.  Strength is equal throughout.  Question some underlying cognitive impairment. -CT head negative for acute changes -Follow MRI brain, if negative would not pursue further CVA work-up at this time -Continue neurochecks -PT/OT eval

## 2022-03-01 ENCOUNTER — Observation Stay (HOSPITAL_COMMUNITY): Payer: Medicare HMO

## 2022-03-01 ENCOUNTER — Other Ambulatory Visit (HOSPITAL_COMMUNITY): Payer: Medicare HMO

## 2022-03-01 DIAGNOSIS — I720 Aneurysm of carotid artery: Secondary | ICD-10-CM | POA: Diagnosis not present

## 2022-03-01 DIAGNOSIS — I779 Disorder of arteries and arterioles, unspecified: Secondary | ICD-10-CM | POA: Diagnosis not present

## 2022-03-01 DIAGNOSIS — I6381 Other cerebral infarction due to occlusion or stenosis of small artery: Secondary | ICD-10-CM | POA: Diagnosis not present

## 2022-03-01 DIAGNOSIS — I6601 Occlusion and stenosis of right middle cerebral artery: Secondary | ICD-10-CM | POA: Diagnosis not present

## 2022-03-01 DIAGNOSIS — R29818 Other symptoms and signs involving the nervous system: Secondary | ICD-10-CM | POA: Diagnosis not present

## 2022-03-01 DIAGNOSIS — E871 Hypo-osmolality and hyponatremia: Secondary | ICD-10-CM | POA: Diagnosis not present

## 2022-03-01 LAB — BASIC METABOLIC PANEL
Anion gap: 7 (ref 5–15)
BUN: 11 mg/dL (ref 8–23)
CO2: 24 mmol/L (ref 22–32)
Calcium: 8.8 mg/dL — ABNORMAL LOW (ref 8.9–10.3)
Chloride: 98 mmol/L (ref 98–111)
Creatinine, Ser: 0.78 mg/dL (ref 0.61–1.24)
GFR, Estimated: >60 mL/min (ref >60)
Glucose, Bld: 91 mg/dL (ref 70–99)
Potassium: 4 mmol/L (ref 3.5–5.1)
Sodium: 129 mmol/L — ABNORMAL LOW (ref 135–145)

## 2022-03-01 LAB — CBC
HCT: 30.8 % — ABNORMAL LOW (ref 39.0–52.0)
Hemoglobin: 11.2 g/dL — ABNORMAL LOW (ref 13.0–17.0)
MCH: 33.6 pg (ref 26.0–34.0)
MCHC: 36.4 g/dL — ABNORMAL HIGH (ref 30.0–36.0)
MCV: 92.5 fL (ref 80.0–100.0)
Platelets: 218 10*3/uL (ref 150–400)
RBC: 3.33 MIL/uL — ABNORMAL LOW (ref 4.22–5.81)
RDW: 12.2 % (ref 11.5–15.5)
WBC: 5.8 10*3/uL (ref 4.0–10.5)
nRBC: 0 % (ref 0.0–0.2)

## 2022-03-01 LAB — SODIUM, URINE, RANDOM: Sodium, Ur: 71 mmol/L

## 2022-03-01 LAB — OSMOLALITY: Osmolality: 261 mOsm/kg — ABNORMAL LOW (ref 275–295)

## 2022-03-01 LAB — OSMOLALITY, URINE: Osmolality, Ur: 377 mOsm/kg (ref 300–900)

## 2022-03-01 MED ORDER — IOHEXOL 350 MG/ML SOLN
75.0000 mL | Freq: Once | INTRAVENOUS | Status: AC | PRN
  Administered 2022-03-01: 75 mL via INTRAVENOUS

## 2022-03-01 MED ORDER — ASPIRIN 81 MG PO TBEC
81.0000 mg | DELAYED_RELEASE_TABLET | Freq: Every day | ORAL | Status: DC
  Administered 2022-03-01 – 2022-03-02 (×2): 81 mg via ORAL
  Filled 2022-03-01 (×2): qty 1

## 2022-03-01 MED ORDER — SODIUM CHLORIDE (PF) 0.9 % IJ SOLN
INTRAMUSCULAR | Status: AC
  Filled 2022-03-01: qty 50

## 2022-03-01 MED ORDER — SODIUM CHLORIDE 1 G PO TABS
1.0000 g | ORAL_TABLET | Freq: Three times a day (TID) | ORAL | Status: DC
  Administered 2022-03-01 – 2022-03-02 (×2): 1 g via ORAL
  Filled 2022-03-01 (×2): qty 1

## 2022-03-01 MED ORDER — ESCITALOPRAM OXALATE 10 MG PO TABS
10.0000 mg | ORAL_TABLET | Freq: Every day | ORAL | Status: DC
  Administered 2022-03-01 – 2022-03-02 (×2): 10 mg via ORAL
  Filled 2022-03-01 (×2): qty 1

## 2022-03-01 NOTE — Plan of Care (Signed)

## 2022-03-01 NOTE — Evaluation (Addendum)
Occupational Therapy Evaluation Patient Details Name: Carlos Wise MRN: [id number] DOB: 1940/11/04 Today's Date: 03/01/2022   History of Present Illness 81 y.o. male with medical history significant for prostate cancer s/p XRT, HTN, esophageal dysphagia/stricture, right ICA aneurysm s/p coiling, seizure disorder, chronic hyponatremia, tobacco use, chronic daily alcohol use who presented to the ED for evaluation of altered mental status and weakness.  History limited from patient due to suspected underlying cognitive impairment and is supplemented by EDP, chart review, and caregiver.CT head without contrast negative for acute intracranial process.  Stable chronic small vessel ischemic changes throughout the white matter noted. MRI pending   Clinical Impression   Patient evaluated by Occupational Therapy with no further acute OT needs identified. All education has been completed and the patient has no further questions. Patient endorsed being at baseline for ADLs. POA present in room reported he is at baseline with movement as well. BUE ROM and strength symmetrical at this time See below for any follow-up Occupational Therapy or equipment needs. OT is signing off. Thank you for this referral.       Recommendations for follow up therapy are one component of a multi-disciplinary discharge planning process, led by the attending physician.  Recommendations may be updated based on patient status, additional functional criteria and insurance authorization.   Follow Up Recommendations  No OT follow up    Assistance Recommended at Discharge Frequent or constant Supervision/Assistance  Patient can return home with the following A little help with walking and/or transfers;A little help with bathing/dressing/bathroom;Assistance with cooking/housework;Direct supervision/assist for financial management;Assist for transportation;Help with stairs or ramp for entrance;Direct supervision/assist for medications  management    Functional Status Assessment  Patient has not had a recent decline in their functional status  Equipment Recommendations  None recommended by OT    Recommendations for Other Services       Precautions / Restrictions Precautions Precautions: Fall Restrictions Weight Bearing Restrictions: No      Mobility Bed Mobility               General bed mobility comments: in recliner and returned to recliner with POA present in room    Transfers Overall transfer level: Needs assistance     Sit to Stand: Min guard                  Balance Overall balance assessment: Needs assistance Sitting-balance support: Feet supported, No upper extremity supported Sitting balance-Leahy Scale: Good     Standing balance support: During functional activity, Single extremity supported Standing balance-Leahy Scale: Fair                             ADL either performed or assessed with clinical judgement   ADL Overall ADL's : Needs assistance/impaired Eating/Feeding: Set up;Sitting   Grooming: Set up;Sitting   Upper Body Bathing: Set up;Sitting   Lower Body Bathing: Min guard;Sit to/from stand Lower Body Bathing Details (indicate cue type and reason): to reach perienal area in standing. Upper Body Dressing : Set up;Sitting   Lower Body Dressing: Minimal assistance;Sit to/from stand   Toilet Transfer: Min guard;Rolling walker (2 wheels) Toilet Transfer Details (indicate cue type and reason): with increased time and wide base of support with functional mobilty and RW Toileting- Water quality scientist and Hygiene: Min guard;Sit to/from stand Toileting - Clothing Manipulation Details (indicate cue type and reason): standing with increased time with one UE support or no UE support to  complete hygiene tasks.             Vision Patient Visual Report: No change from baseline       Perception     Praxis      Pertinent Vitals/Pain Pain  Assessment Pain Assessment: No/denies pain     Hand Dominance Right   Extremity/Trunk Assessment Upper Extremity Assessment Upper Extremity Assessment: Overall WFL for tasks assessed (strength was symetrical bilaterally.)   Lower Extremity Assessment Lower Extremity Assessment: Defer to PT evaluation       Communication Communication Communication: HOH   Cognition Arousal/Alertness: Awake/alert Behavior During Therapy: WFL for tasks assessed/performed Overall Cognitive Status: Difficult to assess Area of Impairment: Following commands                               General Comments: patient is HOH and needs increased time to respond to questions. noted to have some slurred speech at times.     General Comments       Exercises     Shoulder Instructions      Home Living Family/patient expects to be discharged to:: Private residence Living Arrangements: Alone Available Help at Discharge: Personal care attendant;Available 24 hours/day Type of Home: House Home Access: Stairs to enter [employer] of Steps: flight from garage, has chairlift   Home Layout: Multi-level     Bathroom Shower/Tub: Teacher, early years/pre: Standard     Home Equipment: Conservation officer, nature (2 wheels);Shower seat   Additional Comments: pt is giving some conflicting information      Prior Functioning/Environment Prior Level of Function : Patient poor historian/Family not available;Needs assist             Mobility Comments: uses RW when he feels he needs it, caregiver confirms ADLs Comments: has caregiver that comes in AM and PM per pt, assists with ADLs/IADLs, however pt states he also completes ADLs independently at home        OT Problem List:        OT Treatment/Interventions:      OT Goals(Current goals can be found in the care plan section) Acute Rehab OT Goals Patient Stated Goal: to go home OT Goal Formulation: All assessment and education  complete, DC therapy  OT Frequency:      Co-evaluation              AM-PAC OT "6 Clicks" Daily Activity     Outcome Measure Help from another person eating meals?: None Help from another person taking care of personal grooming?: None Help from another person toileting, which includes using toliet, bedpan, or urinal?: A Little Help from another person bathing (including washing, rinsing, drying)?: A Little Help from another person to put on and taking off regular upper body clothing?: A Little Help from another person to put on and taking off regular lower body clothing?: A Little 6 Click Score: 20   End of Session Equipment Utilized During Treatment: Gait belt;Rolling walker (2 wheels) Nurse Communication: Mobility status  Activity Tolerance: Patient tolerated treatment well Patient left: in chair;with call bell/phone within reach;with chair alarm set;with family/visitor present  OT Visit Diagnosis: Unsteadiness on feet (R26.81)                Time: 1331-1405 OT Time Calculation (min): 34 min Charges:  OT General Charges $OT Visit: 1 Visit OT Evaluation $OT Eval Low Complexity: 1 Low OT Treatments $Self Care/Home Management :  8-22 mins  Rennie Plowman, MS Acute Rehabilitation Department Office# [phone]   Marcellina Millin 03/01/2022, 4:51 PM

## 2022-03-01 NOTE — Evaluation (Signed)
Physical Therapy Evaluation Patient Details Name: Carlos Wise MRN: [id number] DOB: 05/24/1941 Today's Date: 03/01/2022  History of Present Illness  81 y.o. male with medical history significant for prostate cancer s/p XRT, HTN, esophageal dysphagia/stricture, right ICA aneurysm s/p coiling, seizure disorder, chronic hyponatremia, tobacco use, chronic daily alcohol use who presented to the ED for evaluation of altered mental status and weakness.  History limited from patient due to suspected underlying cognitive impairment and is supplemented by EDP, chart review, and caregiver.CT head without contrast negative for acute intracranial process.  Stable chronic small vessel ischemic changes throughout the white matter noted. MRI pending  Clinical Impression  Patient evaluated by Physical Therapy with no further acute PT needs identified. All education has been completed and the patient has no further questions.  Pt grossly at his baseline, caregiver confirms, pt does not demonstrate lateral lean as noted on admit. See below for mobility. No further needs at this time  See below for any follow-up Physical Therapy or equipment needs. PT is signing off. Thank you for this referral.        Recommendations for follow up therapy are one component of a multi-disciplinary discharge planning process, led by the attending physician.  Recommendations may be updated based on patient status, additional functional criteria and insurance authorization.  Follow Up Recommendations No PT follow up      Assistance Recommended at Discharge Frequent or constant Supervision/Assistance  Patient can return home with the following  Help with stairs or ramp for entrance;Direct supervision/assist for medications management;Assistance with cooking/housework;Assist for transportation    Equipment Recommendations None recommended by PT  Recommendations for Other Services       Functional Status Assessment Patient has not  had a recent decline in their functional status     Precautions / Restrictions Precautions Precautions: Fall Restrictions Weight Bearing Restrictions: No      Mobility  Bed Mobility               General bed mobility comments: in recliner and returned to transport chair to go to MRI    Transfers Overall transfer level: Needs assistance Equipment used: None Transfers: Sit to/from Stand Sit to Stand: Min guard, Min assist           General transfer comment: incr time, uses wide BOS, light assist to initiate    Ambulation/Gait Ambulation/Gait assistance: Min guard, Min assist Gait Distance (Feet): 140 Feet Assistive device: Rolling walker (2 wheels), None Gait Pattern/deviations: Step-through pattern       General Gait Details: min/guard for safety, stability improved with distance with pt carrying RW end of distance to demonstrate that he did not need it. no overt LOB  Stairs            Wheelchair Mobility    Modified Rankin (Stroke Patients Only)       Balance Overall balance assessment: Needs assistance Sitting-balance support: Feet supported, No upper extremity supported Sitting balance-Leahy Scale: Good       Standing balance-Leahy Scale: Fair Standing balance comment: Fair +. able tamb without UE support, not tested further--as pt going for MRI             High level balance activites: Direction changes, Turns High Level Balance Comments: no LOB with above             Pertinent Vitals/Pain Pain Assessment Pain Assessment: No/denies pain    Home Living Family/patient expects to be discharged to:: Private residence Living Arrangements: Alone Available  Help at Discharge: Personal care attendant;Available 24 hours/day Type of Home: House Home Access: Stairs to enter   [employer] of Steps: flight from garage, has chairlift   Home Layout: Multi-level Home Equipment: Conservation officer, nature (2 wheels);Shower seat       Prior Function Prior Level of Function : Patient poor historian/Family not available;Needs assist             Mobility Comments: uses RW when he feels he needs it, caregiver confirms ADLs Comments: has caregiver that comes in AM and PM per pt, assists with ADLs/IADLs, however pt states he also completes ADLs independently at home     Hand Dominance        Extremity/Trunk Assessment   Upper Extremity Assessment Upper Extremity Assessment: Overall WFL for tasks assessed    Lower Extremity Assessment Lower Extremity Assessment: Overall WFL for tasks assessed       Communication   Communication: HOH  Cognition Arousal/Alertness: Awake/alert Behavior During Therapy: WFL for tasks assessed/performed Overall Cognitive Status: Difficult to assess Area of Impairment: Following commands                       Following Commands: Follows one step commands with increased time       General Comments: pt is HOH, requires incr time to respond however appears related more to hearing deficit than cognitive at this time        General Comments      Exercises     Assessment/Plan    PT Assessment Patient does not need any further PT services  PT Problem List         PT Treatment Interventions      PT Goals (Current goals can be found in the Care Plan section)  Acute Rehab PT Goals Patient Stated Goal: home now PT Goal Formulation: All assessment and education complete, DC therapy    Frequency       Co-evaluation               AM-PAC PT "6 Clicks" Mobility  Outcome Measure Help needed turning from your back to your side while in a flat bed without using bedrails?: A Little Help needed moving from lying on your back to sitting on the side of a flat bed without using bedrails?: A Little Help needed moving to and from a bed to a chair (including a wheelchair)?: A Little Help needed standing up from a chair using your arms (e.g., wheelchair or bedside  chair)?: A Little Help needed to walk in hospital room?: A Little Help needed climbing 3-5 steps with a railing? : A Little 6 Click Score: 18    End of Session Equipment Utilized During Treatment: Gait belt Activity Tolerance: Patient tolerated treatment well Patient left: with call bell/phone within reach;Other (comment);with nursing/sitter in room;with family/visitor present (transport chair) Nurse Communication: Mobility status PT Visit Diagnosis: Unsteadiness on feet (R26.81)    Time: 4010-2725 PT Time Calculation (min) (ACUTE ONLY): 17 min   Charges:   PT Evaluation $PT Eval Low Complexity: [place], PT  Acute Rehab Dept [facility]) [phone]  [facility] Weekend Pager [facility] only)  [phone]  03/01/2022   [facility] 03/01/2022, 3:00 PM

## 2022-03-01 NOTE — Progress Notes (Signed)
PROGRESS NOTE    Carlos Wise  DZH:[id number] DOB: 1941/04/08 DOA: 02/28/2022 PCP: [name], MD    Brief Narrative:  81 year old gentleman with history of prostate cancer status postradiation therapy, essential hypertension, esophageal dysphagia and stricture, right ICA aneurysm status post coiling, seizure disorder, chronic hyponatremia, smoker, chronic daily alcohol user presented to the ER with altered mental status and weakness, noted to have right lateral lean and more confused at home by caregiver.  Patient lives at home.  He has 24 hours nursing assistance.  He drinks about 3 ounces of gin every night.  He was slow to respond and noted to be more weaker than usual so brought to the hospital. In the emergency room, hemodynamically stable.  On room air.  Sodium 125.  CT head normal.  Given 1 L of normal saline.  Admitted to hospital.  MRI consistent with a small left lacunar stroke.   Assessment & Plan:   Acute on chronic hyponatremia: Multifactorial.  Likely due to chronic alcohol use.  Patient is also on Lexapro that can cause hyponatremia.  Sodium 125 on arrival. Treated with isotonic fluid overnight with improvement of symptoms and levels 129. Resume his salt tablets.  Takes 2 g at home.  Started on sodium chloride 1 g 3 times daily with meals, stop IV fluids and monitor sodium levels to ensure stability. We will taper off Lexapro due to persistent hyponatremia. Urine sodium and osmolality, serum muscularity pending.  Acute metabolic encephalopathy: Likely multifactorial.  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 Presented with transient confusion and right leg weakness that is consistent with left lacunar stroke. Neurologically stabilized and improved today. CT head findings, no acute abnormalities. MRI of the brain, small left lacunar infarct. CT angiogram of the head and neck, ordered today. 2D echocardiogram, ordered today. Antiplatelet therapy, none at home. LDL,  ordered today.  Not on a statin at home. Hemoglobin A1c, ordered. Multiple risk factors. We will start patient on aspirin today. Patient is not a tPA or neuro intervention candidate, currently no acute neurological symptoms.  Work with PT OT to determine any needs. We will send referral to neurology for outpatient follow-up.  Alcoholism with risk of alcohol withdrawal: On CIWA protocol.  Multivitamins.  Seizure disorder: Therapeutic on Keppra and tolerating.  Depression: On Lexapro.  Will taper off.     DVT prophylaxis: enoxaparin (LOVENOX) injection 40 mg Start: 02/28/22 2200   Code Status: Full code Family Communication: Patient's healthcare power of attorney Ms. [name] at the bedside Disposition Plan: Status is: Observation The patient will require care spanning > 2 midnights and should be moved to inpatient because: Inpatient investigations needed for acute to stroke     Consultants:  None  Procedures:  None  Antimicrobials:  None   Subjective: Patient seen and examined.  Early morning rounds he was without any complaints.  I examined him with caretaker at the bedside.  According to the caretaker his mental status has improved.  Patient tells me that he has nothing wrong. Discussed MRI findings and further testing.  Objective: Vitals:   03/01/22 0058 03/01/22 0453 03/01/22 0900 03/01/22 1217  BP: 117/75 (!) 153/88 131/73 134/80  Pulse: 71 70 82 73  Resp: '16 18 20 20  '$ Temp: 99.1 F (37.3 C) 98.1 F (36.7 C) 98 F (36.7 C) 98.4 F (36.9 C)  TempSrc: Oral Oral  Oral  SpO2: 98% 97% 96% 99%  Weight:      Height:  Intake/Output Summary (Last 24 hours) at 03/01/2022 1515 Last data filed at 03/01/2022 0900 Gross per 24 hour  Intake 2348.69 ml  Output 1350 ml  Net 998.69 ml   Filed Weights   02/28/22 1631 02/28/22 2052  Weight: 78.5 kg 77.4 kg    Examination:  General exam: Appears calm and comfortable.  Appropriate for age. Respiratory system:  Clear to auscultation. Respiratory effort normal. Cardiovascular system: S1 & S2 heard, RRR. No pedal edema. Gastrointestinal system: Abdomen is nondistended, soft and nontender. No organomegaly or masses felt. Normal bowel sounds heard. Central nervous system: Alert and oriented.  X2-3.  No focal neurological deficits. Extremities: Symmetric 5 x 5 power. Skin: No rashes, lesions or ulcers Psychiatry: Judgement and insight appear normal. Mood & affect normal.    Data Reviewed: I have personally reviewed following labs and imaging studies  CBC: Recent Labs  Lab 02/28/22 1630 02/28/22 1701 03/01/22 0546  WBC 7.7  --  5.8  HGB 12.7* 11.6* 11.2*  HCT 35.5* 34.0* 30.8*  MCV 92.4  --  92.5  PLT 249  --  683   Basic Metabolic Panel: Recent Labs  Lab 02/28/22 1630 02/28/22 1701 02/28/22 1730 02/28/22 2134 03/01/22 0546  NA 125*  125* 124*  --  126* 129*  K 5.2* 4.4  --   --  4.0  CL 90* 88*  --   --  98  CO2 27  --   --   --  24  GLUCOSE 100* 92  --   --  91  BUN 11 10  --   --  11  CREATININE 0.80 0.80  --   --  0.78  CALCIUM 9.3  --   --   --  8.8*  MG  --   --  1.7  --   --    GFR: Estimated Creatinine Clearance: 68.9 mL/min (by C-G formula based on SCr of 0.78 mg/dL). Liver Function Tests: Recent Labs  Lab 02/28/22 1630  AST 21  ALT 10  ALKPHOS 62  BILITOT 1.0  PROT 7.8  ALBUMIN 4.2   No results for input(s): "LIPASE", "AMYLASE" in the last 168 hours. Recent Labs  Lab 02/28/22 1640  AMMONIA 17   Coagulation Profile: No results for input(s): "INR", "PROTIME" in the last 168 hours. Cardiac Enzymes: No results for input(s): "CKTOTAL", "CKMB", "CKMBINDEX", "TROPONINI" in the last 168 hours. BNP (last 3 results) No results for input(s): "PROBNP" in the last 8760 hours. HbA1C: No results for input(s): "HGBA1C" in the last 72 hours. CBG: Recent Labs  Lab 02/28/22 1726  GLUCAP 94   Lipid Profile: No results for input(s): "CHOL", "HDL", "LDLCALC",  "TRIG", "CHOLHDL", "LDLDIRECT" in the last 72 hours. Thyroid Function Tests: No results for input(s): "TSH", "T4TOTAL", "FREET4", "T3FREE", "THYROIDAB" in the last 72 hours. Anemia Panel: No results for input(s): "VITAMINB12", "FOLATE", "FERRITIN", "TIBC", "IRON", "RETICCTPCT" in the last 72 hours. Sepsis Labs: Recent Labs  Lab 02/28/22 1640  LATICACIDVEN 1.1    No results found for this or any previous visit (from the past 240 hour(s)).       Radiology Studies: MR BRAIN WO CONTRAST  Result Date: 03/01/2022 CLINICAL DATA:  81 year old male with TIA. EXAM: MRI HEAD WITHOUT CONTRAST TECHNIQUE: Multiplanar, multiecho pulse sequences of the brain and surrounding structures were obtained without intravenous contrast. COMPARISON:  Head CT 02/28/2022.  Brain MRI 11/23/2021. FINDINGS: Brain: New 15 mm curvilinear area of restricted diffusion in the anterior to mid left corona radiata tracking  toward the left external capsule (series 13, image 44). No other restricted diffusion. Stable cerebral volume. No midline shift, mass effect, evidence of mass lesion, extra-axial collection or acute intracranial hemorrhage. Cervicomedullary junction and pituitary are within normal limits. Confluent bilateral periventricular white matter T2 and FLAIR hyperintensity. Patchy additional chronic signal changes in the bilateral corona radiata, bilateral deep gray matter nuclei. Moderate similar signal heterogeneity in the central pons. No chronic cerebral blood products. Vascular: Major intracranial vascular flow voids are stable since June. Generalized intracranial artery tortuosity. Skull and upper cervical spine: Chronic cervical spine degeneration with up to mild visible spinal stenosis on series 7, image 14. Visualized bone marrow signal is within normal limits. Sinuses/Orbits: Stable.  Mild paranasal sinus mucosal thickening. Other: Mastoids remain well aerated. Grossly normal visible internal auditory structures.  Negative visible scalp soft tissues. IMPRESSION: 1. 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 No associated hemorrhage or mass effect. 2. Otherwise stable noncontrast MRI appearance of the brain since June with moderately advanced signal changes in the white matter and pons suspected due to small vessel disease. Electronically Signed   By: Genevie Ann M.D.   On: 03/01/2022 12:50   CT HEAD WO CONTRAST  Result Date: 02/28/2022 CLINICAL DATA:  Weakness, altered level of consciousness EXAM: CT HEAD WITHOUT CONTRAST TECHNIQUE: Contiguous axial images were obtained from the base of the skull through the vertex without intravenous contrast. RADIATION DOSE REDUCTION: This exam was performed according to the departmental dose-optimization program which includes automated exposure control, adjustment of the mA and/or kV according to patient size and/or use of iterative reconstruction technique. COMPARISON:  11/22/2021, 11/23/2021 FINDINGS: Brain: Stable hypodensities throughout the periventricular white matter consistent with chronic small vessel ischemic change. No signs of acute infarct or hemorrhage. Lateral ventricles and midline structures are grossly unremarkable. No acute extra-axial fluid collections. No mass effect. Vascular: Likely stent in the right ICA distribution. Stable atherosclerosis. No hyperdense vessel. Skull: Normal. Negative for fracture or focal lesion. Sinuses/Orbits: No acute finding. Other: None. IMPRESSION: 1. No acute intracranial process. Stable chronic small-vessel ischemic changes throughout the white matter. Electronically Signed   By: Randa Ngo M.D.   On: 02/28/2022 17:23        Scheduled Meds:  aspirin EC  81 mg Oral Daily   enoxaparin (LOVENOX) injection  40 mg Subcutaneous Q24H   escitalopram  10 mg Oral Daily   folic acid  1 mg Oral Daily   gabapentin  300 mg Oral QHS   levETIRAcetam  500 mg Oral BID   multivitamin with minerals  1 tablet Oral Daily    nicotine  21 mg Transdermal Daily   pantoprazole  40 mg Oral QAC breakfast   sodium chloride (PF)       sodium chloride flush  3 mL Intravenous Q12H   sodium chloride  1 g Oral TID WC   thiamine  100 mg Oral Daily   Or   thiamine  100 mg Intravenous Daily   Continuous Infusions:   LOS: 0 days    Time spent: 35 minutes    Barb Merino, MD [facility] Pager [phone]

## 2022-03-02 ENCOUNTER — Observation Stay (HOSPITAL_BASED_OUTPATIENT_CLINIC_OR_DEPARTMENT_OTHER): Payer: Medicare HMO

## 2022-03-02 DIAGNOSIS — I6389 Other cerebral infarction: Secondary | ICD-10-CM | POA: Diagnosis not present

## 2022-03-02 DIAGNOSIS — E871 Hypo-osmolality and hyponatremia: Secondary | ICD-10-CM | POA: Diagnosis not present

## 2022-03-02 LAB — ECHOCARDIOGRAM COMPLETE
AR max vel: 3.6 cm2
AV Peak grad: 4.8 mmHg
Ao pk vel: 1.09 m/s
Area-P 1/2: 4.06 cm2
Height: 67 in
S' Lateral: 3.4 cm
Weight: 2730.18 oz

## 2022-03-02 LAB — BASIC METABOLIC PANEL
Anion gap: 7 (ref 5–15)
BUN: 9 mg/dL (ref 8–23)
CO2: 25 mmol/L (ref 22–32)
Calcium: 9.1 mg/dL (ref 8.9–10.3)
Chloride: 98 mmol/L (ref 98–111)
Creatinine, Ser: 0.72 mg/dL (ref 0.61–1.24)
GFR, Estimated: >60 mL/min (ref >60)
Glucose, Bld: 88 mg/dL (ref 70–99)
Potassium: 3.7 mmol/L (ref 3.5–5.1)
Sodium: 130 mmol/L — ABNORMAL LOW (ref 135–145)

## 2022-03-02 LAB — LIPID PANEL
Cholesterol: 229 mg/dL — ABNORMAL HIGH (ref 0–200)
HDL: 48 mg/dL (ref >40)
LDL Cholesterol: 161 mg/dL — ABNORMAL HIGH (ref 0–99)
Total CHOL/HDL Ratio: 4.8 RATIO
Triglycerides: 98 mg/dL (ref <150)
VLDL: 20 mg/dL (ref 0–40)

## 2022-03-02 LAB — HEMOGLOBIN A1C
Hgb A1c MFr Bld: 5.1 % (ref 4.8–5.6)
Mean Plasma Glucose: 99.67 mg/dL

## 2022-03-02 LAB — URINE CULTURE: Culture: >=100000 — AB

## 2022-03-02 MED ORDER — ASPIRIN 81 MG PO TBEC: 81.0000 mg | DELAYED_RELEASE_TABLET | Freq: Every day | ORAL | 12 refills | Status: DC

## 2022-03-02 MED ORDER — SODIUM CHLORIDE 1 G PO TABS: 1.0000 g | ORAL_TABLET | Freq: Three times a day (TID) | ORAL | 0 refills | Status: DC

## 2022-03-02 MED ORDER — ESCITALOPRAM OXALATE 10 MG PO TABS: 10.0000 mg | ORAL_TABLET | Freq: Every day | ORAL | 5 refills | Status: DC

## 2022-03-02 MED ORDER — ATORVASTATIN CALCIUM 20 MG PO TABS: 20.0000 mg | ORAL_TABLET | Freq: Every day | ORAL | 11 refills | Status: DC

## 2022-03-02 NOTE — TOC Initial Note (Signed)
Transition of Care [facility]) - Initial/Assessment Note    Patient Details  Name: Carlos Wise MRN: [id number] Date of Birth: 27-Apr-1941  [facility]) CM/SW Contact:    Vassie Moselle, LCSW Phone Number: 03/02/2022, 9:40 AM  Clinical Narrative:                 TOC consulted for substance use counseling/education. Per pt's chart pt drinks 3oz of gin on a nightly basis. CSW met with pt to discuss alcohol use and resources available. Pt reports to CSW that he has not had anything to drink in 5 days. Pt minimizes his alcohol use and denies it causing issues in his life. CSW offered resources for pt which, pt reluctantly accepted. Resources have been added to pt's AVS. No further TOC needs identified. Please reconsult TOC should further needs arise.   Expected Discharge Plan: Home/Self Care Barriers to Discharge: No Barriers Identified   Patient Goals and CMS Choice Patient states their goals for this hospitalization and ongoing recovery are:: To return home   Choice offered to / list presented to : Patient  Expected Discharge Plan and Services Expected Discharge Plan: Home/Self Care In-house Referral: NA Discharge Planning Services: CM Consult Post Acute Care Choice: NA Living arrangements for the past 2 months: Single Family Home Expected Discharge Date: 03/02/22               DME Arranged: N/A DME Agency: NA                  Prior Living Arrangements/Services Living arrangements for the past 2 months: Single Family Home Lives with:: Self Patient language and need for interpreter reviewed:: Yes Do you feel safe going back to the place where you live?: Yes      Need for Family Participation in Patient Care: No (Comment) Care giver support system in place?: Yes (comment) Current home services: DME (Walker, cane, shower seat) Criminal Activity/Legal Involvement Pertinent to Current Situation/Hospitalization: No - Comment as needed  Activities of Daily  Living Home Assistive Devices/Equipment: Bedside commode/3-in-1, Cane (specify quad or straight), Walker (specify type) (front wheel walker) ADL Screening (condition at time of admission) Patient's cognitive ability adequate to safely complete daily activities?: Yes Is the patient deaf or have difficulty hearing?: Yes Does the patient have difficulty seeing, even when wearing glasses/contacts?: No Does the patient have difficulty concentrating, remembering, or making decisions?: Yes Patient able to express need for assistance with ADLs?: Yes Does the patient have difficulty dressing or bathing?: Yes Independently performs ADLs?: No Communication: Independent Dressing (OT): Needs assistance Is this a change from baseline?: Change from baseline, expected to last <3days Grooming: Needs assistance Is this a change from baseline?: Change from baseline, expected to last <3 days Feeding: Independent Bathing: Needs assistance Is this a change from baseline?: Change from baseline, expected to last <3 days Toileting: Needs assistance Is this a change from baseline?: Change from baseline, expected to last <3 days In/Out Bed: Dependent Is this a change from baseline?: Change from baseline, expected to last <3 days Walks in Home: Independent Does the patient have difficulty walking or climbing stairs?: Yes Weakness of Legs: Both Weakness of Arms/Hands: None  Permission Sought/Granted   Permission granted to share information with : No              Emotional Assessment Appearance:: Appears stated age Attitude/Demeanor/Rapport: Guarded Affect (typically observed): Calm Orientation: : Oriented to Self, Oriented to Place, Oriented to  Time, Oriented  to Situation Alcohol / Substance Use: Alcohol Use Psych Involvement: No (comment)  Admission diagnosis:  Hyponatremia [E87.1] Patient Active Problem List   Diagnosis Date Noted   Seizure disorder ([place]) 02/28/2022   Tobacco use 02/28/2022    Depression 16/58/0063   Acute metabolic encephalopathy 49/49/4473   HTN (hypertension) 11/22/2021   HLD (hyperlipidemia) 11/22/2021   (HFpEF) heart failure with preserved ejection fraction ([place]) 11/22/2021   Altered mental status 02/07/2020   Aphasia 02/04/2020   Leukocytosis 02/04/2020   Acute on chronic hyponatremia 02/04/2020   Alcohol use 02/04/2020   Malignant neoplasm of prostate ([place]) 07/24/2014   PCP:  [name], MD Pharmacy:   [facility] [place] [address] [place] [address] Phone: [phone] Fax: [phone]  [id number] [id number]: [phone]: [phone]    Social Determinants of Health (SDOH) Interventions    Readmission Risk Interventions     No data to display

## 2022-03-02 NOTE — Plan of Care (Signed)

## 2022-03-02 NOTE — Discharge Summary (Signed)
Physician Discharge Summary  Carlos Wise:[id number] DOB: 04/03/1941 DOA: 02/28/2022  PCP: [name], MD  Admit date: 02/28/2022 Discharge date: 03/02/2022  Admitted From: Home Disposition: Home  Recommendations for Outpatient Follow-up:  Follow up with PCP in 1-2 weeks Please obtain BMP/CBC in one week We will send referral to neurology for outpatient follow-up  Home Health: N/A Equipment/Devices: N/A  Discharge Condition: Stable CODE STATUS: Full code Diet recommendation: Regular diet  Discharge summary: 81 year old gentleman with history of prostate cancer status postradiation therapy, essential hypertension, esophageal dysphagia and stricture, right ICA aneurysm status post coiling, seizure disorder, chronic hyponatremia, smoker, chronic daily alcohol user presented to the ER with altered mental status and weakness, noted to have right lateral lean and more confused at home by caregiver.  Patient lives at home.  He has 24 hours nursing assistance.  He drinks about 3 ounces of gin every night.  He was slow to respond and noted to be more weaker than usual so brought to the hospital. In the emergency room, hemodynamically stable.  On room air.  Sodium 125.  CT head normal.  Given 1 L of normal saline.  Admitted to hospital.  MRI consistent with a small left lacunar stroke.     # Acute on chronic hyponatremia: Multifactorial.  Likely due to chronic alcohol use.  Patient is also on Lexapro that can cause hyponatremia.  Sodium 125 on arrival. Treated with isotonic fluid overnight with improvement of symptoms and levels 129.  He was monitored off IV fluids and on sodium chloride 1 g 3 times daily, sodium is 130 today. Will discharge on sodium chloride tablets 1 g 3 times daily.  His caretakers are trying to control his free water intake, however this is tried multiple times and failed.  We will allow him to take regular diet, he likes [place] food with soy sauce and can use  it. Lexapro may or may not be causing hyponatremia, however shared decision was done to reduce the dose to 10 mg daily and monitor.   Acute metabolic encephalopathy: Likely multifactorial.  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 Presented with transient confusion and right leg weakness that is consistent with left lacunar stroke. Neurologically stabilized and without neurological deficit after admission. CT head findings, no acute abnormalities. MRI of the brain, small left lacunar infarct. CT angiogram of the head and neck, no large vessel occlusion. 2D echocardiogram, essentially normal.  Normal ejection fraction.  Grade 1 diastolic dysfunction.  No intracardiac thrombus. Antiplatelet therapy, none at home.  Will start patient on aspirin 81 mg daily. LDL, 161.  Previous gastric irritation with rosuvastatin, will try atorvastatin small dose.   Hemoglobin A1c 5.1.  No intervention needed. Multiple risk factors.  Difficulty with compliance. Patient is not a tPA or neuro intervention candidate, currently no acute neurological symptoms. Work with PT OT, no deficits noted.  We will send referral to neurology for outpatient follow-up.   Alcohol use disorder: He will not quit drinking gin.  Counseled.  Less likely to be compliant.  Discussed with healthcare power of attorney, multiple attempts were unsuccessful.   Seizure disorder: Therapeutic on Keppra and tolerating.   Depression: On Lexapro.  Reduce by half.  Patient medically stable today for discharge.  PT OT determined no need at home.  He has 24/7 care.  Discharge Diagnoses:  Principal Problem:   Acute on chronic hyponatremia Active Problems:   Acute metabolic encephalopathy   Alcohol use   Seizure disorder ([place])  Tobacco use   Depression    Discharge Instructions  Discharge Instructions     Ambulatory referral to Neurology   Complete by: As directed    An appointment is requested in approximately: 4 weeks   Diet  general   Complete by: As directed    Increase activity slowly   Complete by: As directed       Allergies as of 03/02/2022       Reactions   Penicillins Other (See Comments)   Patient states no allergy, but has preference that he does not want this medication        Medication List     TAKE these medications    Aleve PM 220-25 MG Tabs Generic drug: Naproxen Sod-diphenhydrAMINE Take 2 tablets by mouth at bedtime.   Allergy Eye Drops 0.025 % ophthalmic solution Generic drug: ketotifen Place 1 drop into both eyes daily as needed (red eyes).   aspirin EC 81 MG tablet Take 1 tablet (81 mg total) by mouth daily. Swallow whole. Start taking on: March 03, 2022   atorvastatin 20 MG tablet Commonly known as: Lipitor Take 1 tablet (20 mg total) by mouth daily.   bismuth subsalicylate 387 FI/43PI suspension Commonly known as: PEPTO BISMOL Take 30 mLs by mouth every 6 (six) hours as needed for indigestion.   cholecalciferol 25 MCG (1000 UNIT) tablet Commonly known as: VITAMIN D3 Take 1,000 Units by mouth daily.   clobetasol 0.05 % external solution Commonly known as: TEMOVATE Apply 1 application  topically 2 (two) times daily as needed for itching.   escitalopram 10 MG tablet Commonly known as: LEXAPRO Take 1 tablet (10 mg total) by mouth daily. What changed:  medication strength how much to take   fluticasone 50 MCG/ACT nasal spray Commonly known as: FLONASE Place 1 spray into both nostrils daily.   folic acid 1 MG tablet Commonly known as: FOLVITE Take 1 mg by mouth daily.   gabapentin 300 MG capsule Commonly known as: NEURONTIN Take 300 mg by mouth at bedtime.   levETIRAcetam 500 MG tablet Commonly known as: KEPPRA Take 1 tablet (500 mg total) by mouth 2 (two) times daily.   loratadine 10 MG tablet Commonly known as: CLARITIN Take 10 mg by mouth daily.   nicotine 14 mg/24hr patch Commonly known as: NICODERM CQ - dosed in mg/24 hours Place 1 patch  (14 mg total) onto the skin daily.   NYQUIL PO Take 5 mLs by mouth at bedtime.   pantoprazole 40 MG tablet Commonly known as: PROTONIX Take 40 mg by mouth daily before breakfast.   polyethylene glycol 17 g packet Commonly known as: MIRALAX / GLYCOLAX Take 17 g by mouth daily as needed for severe constipation.   sodium chloride 0.65 % Soln nasal spray Commonly known as: OCEAN Place 1 spray into both nostrils daily as needed for congestion.   sodium chloride 1 g tablet Take 1 tablet (1 g total) by mouth 3 (three) times daily with meals. What changed:  how much to take when to take this   thiamine 100 MG tablet Commonly known as: Vitamin B-1 Take 250 mg by mouth daily.   triamcinolone cream 0.1 % Commonly known as: KENALOG Apply 1 application  topically 2 (two) times daily as needed for skin breakdown.   VITAMIN B-12 ER PO Take 1,000 mcg by mouth daily.        Allergies  Allergen Reactions   Penicillins Other (See Comments)    Patient states no allergy,  but has preference that he does not want this medication    Consultations: None   Procedures/Studies: ECHOCARDIOGRAM COMPLETE  Result Date: 03/02/2022    ECHOCARDIOGRAM REPORT   Patient Name:   Carlos Wise Date of Exam: 03/02/2022 Medical Rec #:  [id number]    Height:       67.0 in Accession #:    [id number]   Weight:       170.6 lb Date of Birth:  03-05-1941    BSA:          1.890 m Patient Age:    59 years     BP:           146/73 mmHg Patient Gender: M            HR:           64 bpm. Exam Location:  Inpatient Procedure: 2D Echo, Cardiac Doppler and Color Doppler Indications:    Stroke  History:        Patient has prior history of Echocardiogram examinations, most                 recent 02/04/2020. Risk Factors:Hypertension and Dyslipidemia.  Sonographer:    Jefferey Pica Referring Phys: [id number] [place]  1. Left ventricular ejection fraction, by estimation, is 60 to 65%. The left ventricle has  normal function. The left ventricle has no regional wall motion abnormalities. Left ventricular diastolic parameters are consistent with Grade I diastolic dysfunction (impaired relaxation). Elevated left ventricular end-diastolic pressure.  2. Right ventricular systolic function is normal. The right ventricular size is normal. Tricuspid regurgitation signal is inadequate for assessing PA pressure.  3. The mitral valve is normal in structure. No evidence of mitral valve regurgitation. No evidence of mitral stenosis.  4. The aortic valve is tricuspid. There is moderate calcification of the aortic valve. Aortic valve regurgitation is trivial. Aortic valve sclerosis/calcification is present, without any evidence of aortic stenosis. Aortic valve Vmax measures 1.09 m/s.  5. Aortic dilatation noted. There is mild dilatation of the ascending aorta, measuring 43 mm.  6. The inferior vena cava is normal in size with greater than 50% respiratory variability, suggesting right atrial pressure of 3 mmHg. FINDINGS  Left Ventricle: Left ventricular ejection fraction, by estimation, is 60 to 65%. The left ventricle has normal function. The left ventricle has no regional wall motion abnormalities. The left ventricular internal cavity size was normal in size. There is  no left ventricular hypertrophy. Left ventricular diastolic parameters are consistent with Grade I diastolic dysfunction (impaired relaxation). Elevated left ventricular end-diastolic pressure. Right Ventricle: The right ventricular size is normal. No increase in right ventricular wall thickness. Right ventricular systolic function is normal. Tricuspid regurgitation signal is inadequate for assessing PA pressure. Left Atrium: Left atrial size was normal in size. Right Atrium: Right atrial size was normal in size. Pericardium: There is no evidence of pericardial effusion. Mitral Valve: The mitral valve is normal in structure. No evidence of mitral valve regurgitation. No  evidence of mitral valve stenosis. Tricuspid Valve: The tricuspid valve is normal in structure. Tricuspid valve regurgitation is not demonstrated. No evidence of tricuspid stenosis. Aortic Valve: The aortic valve is tricuspid. There is moderate calcification of the aortic valve. Aortic valve regurgitation is trivial. Aortic valve sclerosis/calcification is present, without any evidence of aortic stenosis. Aortic valve peak gradient measures 4.8 mmHg. Pulmonic Valve: The pulmonic valve was normal in structure. Pulmonic valve regurgitation is not visualized. No evidence of pulmonic  stenosis. Aorta: Aortic dilatation noted. There is mild dilatation of the ascending aorta, measuring 43 mm. Venous: The inferior vena cava is normal in size with greater than 50% respiratory variability, suggesting right atrial pressure of 3 mmHg. IAS/Shunts: No atrial level shunt detected by color flow Doppler.  LEFT VENTRICLE PLAX 2D LVIDd:         4.70 cm   Diastology LVIDs:         3.40 cm   LV e' medial:    6.90 cm/s LV PW:         1.00 cm   LV E/e' medial:  13.8 LV IVS:        0.90 cm   LV e' lateral:   6.00 cm/s LVOT diam:     2.00 cm   LV E/e' lateral: 15.9 LV SV:         94 LV SV Index:   50 LVOT Area:     3.14 cm  RIGHT VENTRICLE             IVC RV Basal diam:  2.70 cm     IVC diam: 1.80 cm RV S prime:     14.00 cm/s TAPSE (M-mode): 2.5 cm LEFT ATRIUM             Index LA diam:        3.30 cm 1.75 cm/m LA Vol (A2C):   31.9 ml 16.88 ml/m LA Vol (A4C):   27.3 ml 14.44 ml/m LA Biplane Vol: 30.1 ml 15.92 ml/m  AORTIC VALVE                 PULMONIC VALVE AV Area (Vmax): 3.60 cm     PV Vmax:       0.60 m/s AV Vmax:        109.00 cm/s  PV Peak grad:  1.5 mmHg AV Peak Grad:   4.8 mmHg LVOT Vmax:      125.00 cm/s LVOT Vmean:     80.800 cm/s LVOT VTI:       0.298 m  AORTA Ao Root diam: 3.30 cm Ao Asc diam:  4.30 cm MITRAL VALVE MV Area (PHT): 4.06 cm     SHUNTS MV Decel Time: 187 msec     Systemic VTI:  0.30 m MV E velocity: 95.50  cm/s   Systemic Diam: 2.00 cm MV A velocity: 108.00 cm/s MV E/A ratio:  0.88 Fransico Him MD Electronically signed by Fransico Him MD Signature Date/Time: 03/02/2022/8:49:36 AM    Final    CT ANGIO HEAD W OR WO CONTRAST  Result Date: 03/01/2022 CLINICAL DATA:  Acute neuro deficit.  Rule out stroke EXAM: CT ANGIOGRAPHY HEAD AND NECK TECHNIQUE: Multidetector CT imaging of the head and neck was performed using the standard protocol during bolus administration of intravenous contrast. Multiplanar CT image reconstructions and MIPs were obtained to evaluate the vascular anatomy. Carotid stenosis measurements (when applicable) are obtained utilizing NASCET criteria, using the distal internal carotid diameter as the denominator. RADIATION DOSE REDUCTION: This exam was performed according to the departmental dose-optimization program which includes automated exposure control, adjustment of the mA and/or kV according to patient size and/or use of iterative reconstruction technique. CONTRAST:  37m OMNIPAQUE IOHEXOL 350 MG/ML SOLN COMPARISON:  CT head 02/28/2022.  MRI head 03/01/2022 FINDINGS: CT HEAD FINDINGS Brain: Generalized atrophy. Moderate white matter hypodensity bilaterally. Small acute infarct in the left periventricular white matter best seen on diffusion-weighted imaging. Negative for hemorrhage or mass.  No acute  cortical infarct. Vascular: Negative for hyperdense vessel. Stenting and coiling right cavernous carotid. Skull: Negative Sinuses/Orbits: Paranasal sinuses clear.  Negative orbit Other: None Review of the MIP images confirms the above findings CTA NECK FINDINGS Aortic arch: Standard branching. Imaged portion shows no evidence of aneurysm or dissection. No significant stenosis of the major arch vessel origins. Atherosclerotic calcification aortic arch and proximal great vessels. Right carotid system: Atherosclerotic calcification right carotid bifurcation. 50% diameter stenosis right common carotid  artery just below the bifurcation. Scattered atherosclerotic disease right internal carotid artery without significant stenosis. Left carotid system: Atherosclerotic disease left carotid bifurcation without significant stenosis. Vertebral arteries: Both vertebral arteries are patent to the skull base without significant stenosis Skeleton: Moderate cervical spondylosis with foraminal narrowing bilaterally due to spurring. No acute skeletal abnormality. Other neck: Negative for mass or adenopathy in the neck. Upper chest: Lung apices clear bilaterally. Review of the MIP images confirms the above findings CTA HEAD FINDINGS Anterior circulation: Atherosclerotic calcification in the cavernous carotid bilaterally without stenosis. Right cavernous carotid stenting and coiling. Coiled aneurysm medial cavernous carotid does not fill with contrast. Anterior and middle cerebral arteries widely patent without stenosis or large vessel occlusion. Mild stenosis distal right M1 segment. No aneurysm identified. Posterior circulation: Both vertebral arteries patent to the basilar. PICA patent bilaterally. Basilar patent. Superior cerebellar and posterior cerebral arteries patent. No large vessel occlusion or aneurysm. Venous sinuses: Normal venous enhancement Anatomic variants: None Review of the MIP images confirms the above findings IMPRESSION: 1. CT head negative for acute hemorrhage. 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 Small acute infarct left centrum semiovale best seen on MRI. 2. Negative for intracranial large vessel occlusion. 3. 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 No residual aneurysm. 4. 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 Right internal carotid artery diffusely disease without significant stenosis 5. Atherosclerotic disease left carotid bifurcation without significant stenosis 6. No significant  vertebral artery stenosis. Electronically Signed   By: Franchot Gallo M.D.   On: 03/01/2022 17:43   CT ANGIO NECK W OR WO CONTRAST  Result Date: 03/01/2022 CLINICAL DATA:  Acute neuro deficit.  Rule out stroke EXAM: CT ANGIOGRAPHY HEAD AND NECK TECHNIQUE: Multidetector CT imaging of the head and neck was performed using the standard protocol during bolus administration of intravenous contrast. Multiplanar CT image reconstructions and MIPs were obtained to evaluate the vascular anatomy. Carotid stenosis measurements (when applicable) are obtained utilizing NASCET criteria, using the distal internal carotid diameter as the denominator. RADIATION DOSE REDUCTION: This exam was performed according to the departmental dose-optimization program which includes automated exposure control, adjustment of the mA and/or kV according to patient size and/or use of iterative reconstruction technique. CONTRAST:  39m OMNIPAQUE IOHEXOL 350 MG/ML SOLN COMPARISON:  CT head 02/28/2022.  MRI head 03/01/2022 FINDINGS: CT HEAD FINDINGS Brain: Generalized atrophy. Moderate white matter hypodensity bilaterally. Small acute infarct in the left periventricular white matter best seen on diffusion-weighted imaging. Negative for hemorrhage or mass.  No acute cortical infarct. Vascular: Negative for hyperdense vessel. Stenting and coiling right cavernous carotid. Skull: Negative Sinuses/Orbits: Paranasal sinuses clear.  Negative orbit Other: None Review of the MIP images confirms the above findings CTA NECK FINDINGS Aortic arch: Standard branching. Imaged portion shows no evidence of aneurysm or dissection. No significant stenosis of the major arch vessel origins. Atherosclerotic calcification aortic arch and proximal great vessels. Right carotid system: Atherosclerotic calcification right carotid bifurcation. 50% diameter  stenosis right common carotid artery just below the bifurcation. Scattered atherosclerotic disease right internal carotid  artery without significant stenosis. Left carotid system: Atherosclerotic disease left carotid bifurcation without significant stenosis. Vertebral arteries: Both vertebral arteries are patent to the skull base without significant stenosis Skeleton: Moderate cervical spondylosis with foraminal narrowing bilaterally due to spurring. No acute skeletal abnormality. Other neck: Negative for mass or adenopathy in the neck. Upper chest: Lung apices clear bilaterally. Review of the MIP images confirms the above findings CTA HEAD FINDINGS Anterior circulation: Atherosclerotic calcification in the cavernous carotid bilaterally without stenosis. Right cavernous carotid stenting and coiling. Coiled aneurysm medial cavernous carotid does not fill with contrast. Anterior and middle cerebral arteries widely patent without stenosis or large vessel occlusion. Mild stenosis distal right M1 segment. No aneurysm identified. Posterior circulation: Both vertebral arteries patent to the basilar. PICA patent bilaterally. Basilar patent. Superior cerebellar and posterior cerebral arteries patent. No large vessel occlusion or aneurysm. Venous sinuses: Normal venous enhancement Anatomic variants: None Review of the MIP images confirms the above findings IMPRESSION: 1. CT head negative for acute hemorrhage. 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 Small acute infarct left centrum semiovale best seen on MRI. 2. Negative for intracranial large vessel occlusion. 3. 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 No residual aneurysm. 4. 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 Right internal carotid artery diffusely disease without significant stenosis 5. Atherosclerotic disease left carotid bifurcation without significant stenosis 6. No significant vertebral artery stenosis. Electronically Signed   By: Franchot Gallo M.D.   On: 03/01/2022  17:43   MR BRAIN WO CONTRAST  Result Date: 03/01/2022 CLINICAL DATA:  81 year old male with TIA. EXAM: MRI HEAD WITHOUT CONTRAST TECHNIQUE: Multiplanar, multiecho pulse sequences of the brain and surrounding structures were obtained without intravenous contrast. COMPARISON:  Head CT 02/28/2022.  Brain MRI 11/23/2021. FINDINGS: Brain: New 15 mm curvilinear area of restricted diffusion in the anterior to mid left corona radiata tracking toward the left external capsule (series 13, image 44). No other restricted diffusion. Stable cerebral volume. No midline shift, mass effect, evidence of mass lesion, extra-axial collection or acute intracranial hemorrhage. Cervicomedullary junction and pituitary are within normal limits. Confluent bilateral periventricular white matter T2 and FLAIR hyperintensity. Patchy additional chronic signal changes in the bilateral corona radiata, bilateral deep gray matter nuclei. Moderate similar signal heterogeneity in the central pons. No chronic cerebral blood products. Vascular: Major intracranial vascular flow voids are stable since June. Generalized intracranial artery tortuosity. Skull and upper cervical spine: Chronic cervical spine degeneration with up to mild visible spinal stenosis on series 7, image 14. Visualized bone marrow signal is within normal limits. Sinuses/Orbits: Stable.  Mild paranasal sinus mucosal thickening. Other: Mastoids remain well aerated. Grossly normal visible internal auditory structures. Negative visible scalp soft tissues. IMPRESSION: 1. 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 No associated hemorrhage or mass effect. 2. Otherwise stable noncontrast MRI appearance of the brain since June with moderately advanced signal changes in the white matter and pons suspected due to small vessel disease. Electronically Signed   By: Genevie Ann M.D.   On: 03/01/2022 12:50   CT HEAD WO CONTRAST  Result Date: 02/28/2022 CLINICAL DATA:  Weakness, altered  level of consciousness EXAM: CT HEAD WITHOUT CONTRAST TECHNIQUE: Contiguous axial images were obtained from the base of the skull through the vertex without intravenous contrast. RADIATION DOSE REDUCTION: This exam was performed according to  the departmental dose-optimization program which includes automated exposure control, adjustment of the mA and/or kV according to patient size and/or use of iterative reconstruction technique. COMPARISON:  11/22/2021, 11/23/2021 FINDINGS: Brain: Stable hypodensities throughout the periventricular white matter consistent with chronic small vessel ischemic change. No signs of acute infarct or hemorrhage. Lateral ventricles and midline structures are grossly unremarkable. No acute extra-axial fluid collections. No mass effect. Vascular: Likely stent in the right ICA distribution. Stable atherosclerosis. No hyperdense vessel. Skull: Normal. Negative for fracture or focal lesion. Sinuses/Orbits: No acute finding. Other: None. IMPRESSION: 1. No acute intracranial process. Stable chronic small-vessel ischemic changes throughout the white matter. Electronically Signed   By: Randa Ngo M.D.   On: 02/28/2022 17:23   (Echo, Carotid, EGD, Colonoscopy, ERCP)    Subjective: Patient seen and examined.  Pleasant.  Denies any complaints.  Eager to go home.   Discharge Exam: Vitals:   03/01/22 2018 03/02/22 0604  BP: (!) 142/75 (!) 146/73  Pulse: 83 72  Resp: 18 18  Temp: 98.1 F (36.7 C)   SpO2: 97% 96%   Vitals:   03/01/22 0900 03/01/22 1217 03/01/22 2018 03/02/22 0604  BP: 131/73 134/80 (!) 142/75 (!) 146/73  Pulse: 82 73 83 72  Resp: '20 20 18 18  '$ Temp: 98 F (36.7 C) 98.4 F (36.9 C) 98.1 F (36.7 C)   TempSrc:  Oral Oral   SpO2: 96% 99% 97% 96%  Weight:      Height:        General: Pt is alert, awake, not in acute distress, poor historian.  Flat affect.  No neurological deficit.  Eating breakfast. Cardiovascular: RRR, S1/S2 +, no rubs, no  gallops Respiratory: CTA bilaterally, no wheezing, no rhonchi Abdominal: Soft, NT, ND, bowel sounds + Extremities: no edema, no cyanosis    The results of significant diagnostics from this hospitalization (including imaging, microbiology, ancillary and laboratory) are listed below for reference.     Microbiology: Recent Results (from the past 240 hour(s))  Urine Culture     Status: None (Preliminary result)   Collection Time: 02/28/22  7:22 PM   Specimen: Urine, Clean Catch  Result Value Ref Range Status   Specimen Description   Final    URINE, CLEAN CATCH Performed at [facility], [place] [address] [place], [place] [address]    Special Requests   Final    NONE Performed at [facility], [place] [address], [place], [place] [address]    Culture   Final    CULTURE REINCUBATED FOR BETTER GROWTH Performed at [facility], [place] [address] [place], [place] [address]    Report Status PENDING  Incomplete     Labs: BNP (last 3 results) No results for input(s): "BNP" in the last 8760 hours. Basic Metabolic Panel: Recent Labs  Lab 02/28/22 1630 02/28/22 1701 02/28/22 1730 02/28/22 2134 03/01/22 0546 03/02/22 0507  NA 125*  125* 124*  --  126* 129* 130*  K 5.2* 4.4  --   --  4.0 3.7  CL 90* 88*  --   --  98 98  CO2 27  --   --   --  24 25  GLUCOSE 100* 92  --   --  91 88  BUN 11 10  --   --  11 9  CREATININE 0.80 0.80  --   --  0.78 0.72  CALCIUM 9.3  --   --   --  8.8* 9.1  MG  --   --  1.7  --   --   --    Liver Function Tests: Recent Labs  Lab 02/28/22 1630  AST 21  ALT 10  ALKPHOS 62  BILITOT 1.0  PROT 7.8  ALBUMIN 4.2   No results for input(s): "LIPASE", "AMYLASE" in the last 168 hours. Recent Labs  Lab 02/28/22 1640  AMMONIA 17   CBC: Recent Labs  Lab 02/28/22 1630 02/28/22 1701 03/01/22 0546  WBC 7.7  --  5.8  HGB 12.7* 11.6* 11.2*  HCT 35.5* 34.0* 30.8*  MCV 92.4  --  92.5  PLT 249  --  218    Cardiac Enzymes: No results for input(s): "CKTOTAL", "CKMB", "CKMBINDEX", "TROPONINI" in the last 168 hours. BNP: Invalid input(s): "POCBNP" CBG: Recent Labs  Lab 02/28/22 1726  GLUCAP 94   D-Dimer No results for input(s): "DDIMER" in the last 72 hours. Hgb A1c Recent Labs    03/02/22 0507  HGBA1C 5.1   Lipid Profile Recent Labs    03/02/22 0507  CHOL 229*  HDL 48  LDLCALC 161*  TRIG 98  CHOLHDL 4.8   Thyroid function studies No results for input(s): "TSH", "T4TOTAL", "T3FREE", "THYROIDAB" in the last 72 hours.  Invalid input(s): "FREET3" Anemia work up No results for input(s): "VITAMINB12", "FOLATE", "FERRITIN", "TIBC", "IRON", "RETICCTPCT" in the last 72 hours. Urinalysis    Component Value Date/Time   COLORURINE YELLOW 02/28/2022 1801   APPEARANCEUR HAZY (A) 02/28/2022 1801   LABSPEC 1.011 02/28/2022 1801   PHURINE 7.0 02/28/2022 1801   GLUCOSEU NEGATIVE 02/28/2022 1801   HGBUR NEGATIVE 02/28/2022 1801   BILIRUBINUR NEGATIVE 02/28/2022 1801   KETONESUR NEGATIVE 02/28/2022 1801   PROTEINUR NEGATIVE 02/28/2022 1801   NITRITE NEGATIVE 02/28/2022 1801   LEUKOCYTESUR MODERATE (A) 02/28/2022 1801   Sepsis Labs Recent Labs  Lab 02/28/22 1630 03/01/22 0546  WBC 7.7 5.8   Microbiology Recent Results (from the past 240 hour(s))  Urine Culture     Status: None (Preliminary result)   Collection Time: 02/28/22  7:22 PM   Specimen: Urine, Clean Catch  Result Value Ref Range Status   Specimen Description   Final    URINE, CLEAN CATCH Performed at [facility], [place] [address] [place], [place] [address]    Special Requests   Final    NONE Performed at [facility], [place] [address] [place], [place] [address]    Culture   Final    CULTURE REINCUBATED FOR BETTER GROWTH Performed at [facility], [place] [address] [place], [place] [address]    Report Status PENDING  Incomplete     Time coordinating discharge:  32 minutes  SIGNED:   Barb Merino, MD  [facility] 03/02/2022, 9:36 AM

## 2022-03-09 ENCOUNTER — Inpatient Hospital Stay (HOSPITAL_COMMUNITY): Payer: Medicare HMO

## 2022-03-09 ENCOUNTER — Emergency Department (HOSPITAL_COMMUNITY): Payer: Medicare HMO

## 2022-03-09 ENCOUNTER — Other Ambulatory Visit: Payer: Self-pay

## 2022-03-09 ENCOUNTER — Inpatient Hospital Stay (HOSPITAL_COMMUNITY)
Admission: EM | Admit: 2022-03-09 | Discharge: 2022-03-25 | DRG: 082 | Disposition: A | Discharge status: Home Health | Payer: Medicare HMO | Attending: Internal Medicine | Admitting: Internal Medicine

## 2022-03-09 DIAGNOSIS — R29714 NIHSS score 14: Secondary | ICD-10-CM | POA: Diagnosis present

## 2022-03-09 DIAGNOSIS — N390 Urinary tract infection, site not specified: Secondary | ICD-10-CM | POA: Diagnosis present

## 2022-03-09 DIAGNOSIS — Z8546 Personal history of malignant neoplasm of prostate: Secondary | ICD-10-CM | POA: Diagnosis not present

## 2022-03-09 DIAGNOSIS — G9340 Encephalopathy, unspecified: Secondary | ICD-10-CM

## 2022-03-09 DIAGNOSIS — R9431 Abnormal electrocardiogram [ECG] [EKG]: Secondary | ICD-10-CM | POA: Diagnosis not present

## 2022-03-09 DIAGNOSIS — I6523 Occlusion and stenosis of bilateral carotid arteries: Secondary | ICD-10-CM | POA: Diagnosis not present

## 2022-03-09 DIAGNOSIS — R402212 Coma scale, best verbal response, none, at arrival to emergency department: Secondary | ICD-10-CM | POA: Diagnosis not present

## 2022-03-09 DIAGNOSIS — Z8679 Personal history of other diseases of the circulatory system: Secondary | ICD-10-CM

## 2022-03-09 DIAGNOSIS — I6521 Occlusion and stenosis of right carotid artery: Secondary | ICD-10-CM | POA: Diagnosis not present

## 2022-03-09 DIAGNOSIS — Z923 Personal history of irradiation: Secondary | ICD-10-CM | POA: Diagnosis not present

## 2022-03-09 DIAGNOSIS — F17210 Nicotine dependence, cigarettes, uncomplicated: Secondary | ICD-10-CM | POA: Diagnosis not present

## 2022-03-09 DIAGNOSIS — E222 Syndrome of inappropriate secretion of antidiuretic hormone: Secondary | ICD-10-CM | POA: Diagnosis present

## 2022-03-09 DIAGNOSIS — R4182 Altered mental status, unspecified: Secondary | ICD-10-CM | POA: Diagnosis not present

## 2022-03-09 DIAGNOSIS — Z7189 Other specified counseling: Secondary | ICD-10-CM | POA: Diagnosis not present

## 2022-03-09 DIAGNOSIS — S0003XA Contusion of scalp, initial encounter: Secondary | ICD-10-CM | POA: Diagnosis present

## 2022-03-09 DIAGNOSIS — Y92239 Unspecified place in hospital as the place of occurrence of the external cause: Secondary | ICD-10-CM | POA: Diagnosis not present

## 2022-03-09 DIAGNOSIS — Z781 Physical restraint status: Secondary | ICD-10-CM | POA: Diagnosis not present

## 2022-03-09 DIAGNOSIS — R531 Weakness: Secondary | ICD-10-CM | POA: Diagnosis not present

## 2022-03-09 DIAGNOSIS — E785 Hyperlipidemia, unspecified: Secondary | ICD-10-CM | POA: Diagnosis present

## 2022-03-09 DIAGNOSIS — R627 Adult failure to thrive: Secondary | ICD-10-CM | POA: Diagnosis present

## 2022-03-09 DIAGNOSIS — W19XXXA Unspecified fall, initial encounter: Secondary | ICD-10-CM | POA: Diagnosis not present

## 2022-03-09 DIAGNOSIS — I110 Hypertensive heart disease with heart failure: Secondary | ICD-10-CM | POA: Diagnosis present

## 2022-03-09 DIAGNOSIS — G9341 Metabolic encephalopathy: Secondary | ICD-10-CM

## 2022-03-09 DIAGNOSIS — S0101XA Laceration without foreign body of scalp, initial encounter: Secondary | ICD-10-CM | POA: Diagnosis not present

## 2022-03-09 DIAGNOSIS — I6329 Cerebral infarction due to unspecified occlusion or stenosis of other precerebral arteries: Secondary | ICD-10-CM | POA: Diagnosis not present

## 2022-03-09 DIAGNOSIS — F32A Depression, unspecified: Secondary | ICD-10-CM | POA: Diagnosis present

## 2022-03-09 DIAGNOSIS — Z7982 Long term (current) use of aspirin: Secondary | ICD-10-CM

## 2022-03-09 DIAGNOSIS — T423X5A Adverse effect of barbiturates, initial encounter: Secondary | ICD-10-CM | POA: Diagnosis not present

## 2022-03-09 DIAGNOSIS — S065XAA Traumatic subdural hemorrhage with loss of consciousness status unknown, initial encounter: Principal | ICD-10-CM | POA: Diagnosis present

## 2022-03-09 DIAGNOSIS — I69351 Hemiplegia and hemiparesis following cerebral infarction affecting right dominant side: Secondary | ICD-10-CM | POA: Diagnosis not present

## 2022-03-09 DIAGNOSIS — F10239 Alcohol dependence with withdrawal, unspecified: Secondary | ICD-10-CM | POA: Diagnosis present

## 2022-03-09 DIAGNOSIS — G40909 Epilepsy, unspecified, not intractable, without status epilepticus: Secondary | ICD-10-CM | POA: Diagnosis present

## 2022-03-09 DIAGNOSIS — Z880 Allergy status to penicillin: Secondary | ICD-10-CM

## 2022-03-09 DIAGNOSIS — Y92009 Unspecified place in unspecified non-institutional (private) residence as the place of occurrence of the external cause: Secondary | ICD-10-CM | POA: Diagnosis not present

## 2022-03-09 DIAGNOSIS — R569 Unspecified convulsions: Secondary | ICD-10-CM | POA: Diagnosis not present

## 2022-03-09 DIAGNOSIS — E871 Hypo-osmolality and hyponatremia: Secondary | ICD-10-CM | POA: Diagnosis not present

## 2022-03-09 DIAGNOSIS — Z8249 Family history of ischemic heart disease and other diseases of the circulatory system: Secondary | ICD-10-CM

## 2022-03-09 DIAGNOSIS — S0990XA Unspecified injury of head, initial encounter: Secondary | ICD-10-CM | POA: Diagnosis not present

## 2022-03-09 DIAGNOSIS — T1490XA Injury, unspecified, initial encounter: Secondary | ICD-10-CM | POA: Diagnosis not present

## 2022-03-09 DIAGNOSIS — R4701 Aphasia: Secondary | ICD-10-CM | POA: Diagnosis not present

## 2022-03-09 DIAGNOSIS — R1310 Dysphagia, unspecified: Secondary | ICD-10-CM | POA: Diagnosis present

## 2022-03-09 DIAGNOSIS — Z515 Encounter for palliative care: Secondary | ICD-10-CM | POA: Diagnosis not present

## 2022-03-09 DIAGNOSIS — Z800 Family history of malignant neoplasm of digestive organs: Secondary | ICD-10-CM

## 2022-03-09 DIAGNOSIS — E440 Moderate protein-calorie malnutrition: Secondary | ICD-10-CM | POA: Diagnosis present

## 2022-03-09 DIAGNOSIS — Z79899 Other long term (current) drug therapy: Secondary | ICD-10-CM

## 2022-03-09 DIAGNOSIS — K219 Gastro-esophageal reflux disease without esophagitis: Secondary | ICD-10-CM | POA: Diagnosis present

## 2022-03-09 DIAGNOSIS — Z6828 Body mass index (BMI) 28.0-28.9, adult: Secondary | ICD-10-CM

## 2022-03-09 DIAGNOSIS — Z4682 Encounter for fitting and adjustment of non-vascular catheter: Secondary | ICD-10-CM | POA: Diagnosis not present

## 2022-03-09 DIAGNOSIS — Z8042 Family history of malignant neoplasm of prostate: Secondary | ICD-10-CM | POA: Diagnosis not present

## 2022-03-09 DIAGNOSIS — Z743 Need for continuous supervision: Secondary | ICD-10-CM | POA: Diagnosis not present

## 2022-03-09 DIAGNOSIS — Z8673 Personal history of transient ischemic attack (TIA), and cerebral infarction without residual deficits: Secondary | ICD-10-CM

## 2022-03-09 DIAGNOSIS — R402362 Coma scale, best motor response, obeys commands, at arrival to emergency department: Secondary | ICD-10-CM | POA: Diagnosis present

## 2022-03-09 DIAGNOSIS — S065X0A Traumatic subdural hemorrhage without loss of consciousness, initial encounter: Secondary | ICD-10-CM | POA: Diagnosis not present

## 2022-03-09 DIAGNOSIS — Z6827 Body mass index (BMI) 27.0-27.9, adult: Secondary | ICD-10-CM

## 2022-03-09 DIAGNOSIS — R402142 Coma scale, eyes open, spontaneous, at arrival to emergency department: Secondary | ICD-10-CM | POA: Diagnosis present

## 2022-03-09 DIAGNOSIS — Z751 Person awaiting admission to adequate facility elsewhere: Secondary | ICD-10-CM

## 2022-03-09 DIAGNOSIS — R4781 Slurred speech: Secondary | ICD-10-CM | POA: Diagnosis not present

## 2022-03-09 LAB — CBC WITH DIFFERENTIAL/PLATELET
Abs Immature Granulocytes: 0.09 10*3/uL — ABNORMAL HIGH (ref 0.00–0.07)
Basophils Absolute: 0.1 10*3/uL (ref 0.0–0.1)
Basophils Relative: 1 %
Eosinophils Absolute: 0.2 10*3/uL (ref 0.0–0.5)
Eosinophils Relative: 2 %
HCT: 36.2 % — ABNORMAL LOW (ref 39.0–52.0)
Hemoglobin: 13.2 g/dL (ref 13.0–17.0)
Immature Granulocytes: 1 %
Lymphocytes Relative: 16 %
Lymphs Abs: 1.3 10*3/uL (ref 0.7–4.0)
MCH: 33.2 pg (ref 26.0–34.0)
MCHC: 36.5 g/dL — ABNORMAL HIGH (ref 30.0–36.0)
MCV: 91 fL (ref 80.0–100.0)
Monocytes Absolute: 0.7 10*3/uL (ref 0.1–1.0)
Monocytes Relative: 9 %
Neutro Abs: 5.6 10*3/uL (ref 1.7–7.7)
Neutrophils Relative %: 71 %
Platelets: 302 10*3/uL (ref 150–400)
RBC: 3.98 MIL/uL — ABNORMAL LOW (ref 4.22–5.81)
RDW: 12 % (ref 11.5–15.5)
WBC: 7.9 10*3/uL (ref 4.0–10.5)
nRBC: 0 % (ref 0.0–0.2)

## 2022-03-09 LAB — URINALYSIS, ROUTINE W REFLEX MICROSCOPIC
Bilirubin Urine: NEGATIVE
Glucose, UA: NEGATIVE mg/dL
Ketones, ur: NEGATIVE mg/dL
Nitrite: NEGATIVE
Protein, ur: NEGATIVE mg/dL
Specific Gravity, Urine: <1.005 — ABNORMAL LOW (ref 1.005–1.030)
pH: 7 (ref 5.0–8.0)

## 2022-03-09 LAB — COMPREHENSIVE METABOLIC PANEL
ALT: 13 U/L (ref 0–44)
AST: 32 U/L (ref 15–41)
Albumin: 4.1 g/dL (ref 3.5–5.0)
Alkaline Phosphatase: 63 U/L (ref 38–126)
Anion gap: 10 (ref 5–15)
BUN: 9 mg/dL (ref 8–23)
CO2: 25 mmol/L (ref 22–32)
Calcium: 9.4 mg/dL (ref 8.9–10.3)
Chloride: 90 mmol/L — ABNORMAL LOW (ref 98–111)
Creatinine, Ser: 0.95 mg/dL (ref 0.61–1.24)
GFR, Estimated: >60 mL/min (ref >60)
Glucose, Bld: 104 mg/dL — ABNORMAL HIGH (ref 70–99)
Potassium: 4.7 mmol/L (ref 3.5–5.1)
Sodium: 125 mmol/L — ABNORMAL LOW (ref 135–145)
Total Bilirubin: 0.8 mg/dL (ref 0.3–1.2)
Total Protein: 6.8 g/dL (ref 6.5–8.1)

## 2022-03-09 LAB — ETHANOL
Alcohol, Ethyl (B): <10 mg/dL (ref <10)
Alcohol, Ethyl (B): <10 mg/dL (ref <10)

## 2022-03-09 LAB — URINALYSIS, MICROSCOPIC (REFLEX)

## 2022-03-09 LAB — OSMOLALITY: Osmolality: 265 mOsm/kg — ABNORMAL LOW (ref 275–295)

## 2022-03-09 LAB — MRSA NEXT GEN BY PCR, NASAL: MRSA by PCR Next Gen: NOT DETECTED

## 2022-03-09 LAB — AMMONIA: Ammonia: 31 umol/L (ref 9–35)

## 2022-03-09 MED ORDER — THIAMINE MONONITRATE 100 MG PO TABS: 100.0000 mg | ORAL_TABLET | Freq: Every day | ORAL | Status: DC

## 2022-03-09 MED ORDER — IOHEXOL 350 MG/ML SOLN
75.0000 mL | Freq: Once | INTRAVENOUS | Status: AC | PRN
  Administered 2022-03-09: 75 mL via INTRAVENOUS

## 2022-03-09 MED ORDER — PHENOBARBITAL SODIUM 65 MG/ML IJ SOLN: 65.0000 mg | Freq: Every day | INTRAMUSCULAR | Status: DC

## 2022-03-09 MED ORDER — FOLIC ACID 1 MG PO TABS: 1.0000 mg | ORAL_TABLET | Freq: Every day | ORAL | Status: DC

## 2022-03-09 MED ORDER — THIAMINE HCL 100 MG/ML IJ SOLN
100.0000 mg | Freq: Every day | INTRAMUSCULAR | Status: DC
  Administered 2022-03-09 – 2022-03-10 (×2): 100 mg via INTRAVENOUS
  Filled 2022-03-09 (×2): qty 2

## 2022-03-09 MED ORDER — FLUTICASONE PROPIONATE 50 MCG/ACT NA SUSP
1.0000 | Freq: Every day | NASAL | Status: DC
  Administered 2022-03-10 – 2022-03-25 (×15): 1 via NASAL
  Filled 2022-03-09: qty 16

## 2022-03-09 MED ORDER — DOCUSATE SODIUM 100 MG PO CAPS: 100.0000 mg | ORAL_CAPSULE | Freq: Two times a day (BID) | ORAL | Status: DC | PRN

## 2022-03-09 MED ORDER — PHENOBARBITAL SODIUM 65 MG/ML IJ SOLN: 65.0000 mg | Freq: Three times a day (TID) | INTRAMUSCULAR | Status: DC

## 2022-03-09 MED ORDER — PHENOBARBITAL SODIUM 130 MG/ML IJ SOLN
130.0000 mg | Freq: Three times a day (TID) | INTRAMUSCULAR | Status: DC
  Administered 2022-03-09 – 2022-03-10 (×2): 130 mg via INTRAVENOUS
  Filled 2022-03-09 (×2): qty 1

## 2022-03-09 MED ORDER — SODIUM CHLORIDE 0.9 % IV BOLUS
1000.0000 mL | Freq: Once | INTRAVENOUS | Status: AC
  Administered 2022-03-09: 1000 mL via INTRAVENOUS

## 2022-03-09 MED ORDER — SALINE SPRAY 0.65 % NA SOLN: 1.0000 | Freq: Every day | NASAL | Status: DC | PRN

## 2022-03-09 MED ORDER — CHLORHEXIDINE GLUCONATE CLOTH 2 % EX PADS
6.0000 | MEDICATED_PAD | Freq: Every day | CUTANEOUS | Status: DC
  Administered 2022-03-09 – 2022-03-15 (×7): 6 via TOPICAL

## 2022-03-09 MED ORDER — THIAMINE HCL 100 MG/ML IJ SOLN
100.0000 mg | Freq: Once | INTRAMUSCULAR | Status: AC
  Administered 2022-03-09: 100 mg via INTRAVENOUS
  Filled 2022-03-09: qty 2

## 2022-03-09 MED ORDER — ONDANSETRON HCL 4 MG/2ML IJ SOLN: 4.0000 mg | Freq: Four times a day (QID) | INTRAMUSCULAR | Status: DC | PRN

## 2022-03-09 MED ORDER — POLYETHYLENE GLYCOL 3350 17 G PO PACK: 17.0000 g | PACK | Freq: Every day | ORAL | Status: DC | PRN

## 2022-03-09 MED ORDER — NICOTINE 21 MG/24HR TD PT24
21.0000 mg | MEDICATED_PATCH | Freq: Every day | TRANSDERMAL | Status: DC
  Administered 2022-03-09 – 2022-03-25 (×17): 21 mg via TRANSDERMAL
  Filled 2022-03-09 (×17): qty 1

## 2022-03-09 MED ORDER — LORAZEPAM 1 MG PO TABS
1.0000 mg | ORAL_TABLET | ORAL | Status: AC | PRN
  Filled 2022-03-09: qty 1

## 2022-03-09 MED ORDER — PHENOBARBITAL SODIUM 130 MG/ML IJ SOLN
130.0000 mg | Freq: Once | INTRAMUSCULAR | Status: AC
  Administered 2022-03-09: 130 mg via INTRAVENOUS
  Filled 2022-03-09: qty 1

## 2022-03-09 MED ORDER — LEVETIRACETAM IN NACL 1500 MG/100ML IV SOLN
3000.0000 mg | Freq: Once | INTRAVENOUS | Status: AC
  Administered 2022-03-09: 1500 mg via INTRAVENOUS
  Filled 2022-03-09: qty 200

## 2022-03-09 MED ORDER — KETOTIFEN FUMARATE 0.025 % OP SOLN: 1.0000 [drp] | Freq: Every day | OPHTHALMIC | Status: DC | PRN

## 2022-03-09 MED ORDER — ADULT MULTIVITAMIN W/MINERALS CH
1.0000 | ORAL_TABLET | Freq: Every day | ORAL | Status: DC
  Filled 2022-03-09: qty 1

## 2022-03-09 MED ORDER — LORAZEPAM 2 MG/ML IJ SOLN
1.0000 mg | INTRAMUSCULAR | Status: AC | PRN
  Administered 2022-03-09: 1 mg via INTRAVENOUS
  Administered 2022-03-10 (×2): 2 mg via INTRAVENOUS
  Filled 2022-03-09 (×4): qty 1

## 2022-03-09 NOTE — Progress Notes (Signed)
Patient ID: Carlos Wise, male   DOB: 15-Aug-1940, 81 y.o.   MRN: [id number] Pt with history of stroke and recent discharge with neuology f/u planned who apparently had a sz today. Loaded with keppra. Has post-sz aphasia and weakness. HCT shows tiny extradural hyperdensity without mass effect or shift. Not on blood thinners. Being admitted by another service. No need for any acute neurosurgical intervention. This is so tiny I don't believe routine repeat imaging is necessary unless he exhibits some reason like neuro decline or focal deficit or some other symptom or sign that requires re-imaging. Please call if we can help in any way.

## 2022-03-09 NOTE — ED Provider Notes (Signed)
[facility] EMERGENCY DEPARTMENT Provider Note   CSN: [id number] Arrival date & time: 03/09/22  1249     History  Chief Complaint  Patient presents with   Carlos Wise is a 81 y.o. male.   Fall  Patient presents reportedly with fall.  Reportedly showering and found on the ground.  Slurred speech.  Patient is not able to speak at this time.  Reportedly had a previous stroke.  Reviewing notes it appears as if he has had seizures in the past.  Had aphasia after prolonged postictal period.  Is reportedly on Keppra.  Recent admission in the hospital 2 weeks ago and had a stroke at that time.    Past Medical History:  Diagnosis Date   Herpes zoster    Hyperlipemia    Hypertension    Intestinal polyp    hyperplastic   Prostate cancer ([facility])    S/P radiation therapy 12/12/2014 through 02/06/2015                                                      Prostate 7800 cGy in 40 sessions, seminal vesicles 5600 cGy in 40 sessions                           Seizure disorder ([place])     Home Medications Prior to Admission medications   Medication Sig Start Date End Date Taking? Authorizing Provider  aspirin EC 81 MG tablet Take 1 tablet (81 mg total) by mouth daily. Swallow whole. 03/03/22   Barb Merino, MD  atorvastatin (LIPITOR) 20 MG tablet Take 1 tablet (20 mg total) by mouth daily. 03/02/22 03/02/23  Barb Merino, MD  bismuth subsalicylate (PEPTO BISMOL) 262 MG/15ML suspension Take 30 mLs by mouth every 6 (six) hours as needed for indigestion.    [provider]  cholecalciferol (VITAMIN D3) 25 MCG (1000 UNIT) tablet Take 1,000 Units by mouth daily.    [provider]  clobetasol (TEMOVATE) 0.05 % external solution Apply 1 application  topically 2 (two) times daily as needed for itching. 10/13/21   [provider]  Cyanocobalamin (VITAMIN B-12 ER PO) Take 1,000 mcg by mouth daily.    [provider]  escitalopram (LEXAPRO) 10 MG  tablet Take 1 tablet (10 mg total) by mouth daily. 03/02/22 08/29/22  Barb Merino, MD  fluticasone (FLONASE) 50 MCG/ACT nasal spray Place 1 spray into both nostrils daily. 12/10/19   [provider]  folic acid (FOLVITE) 1 MG tablet Take 1 mg by mouth daily.    [provider]  gabapentin (NEURONTIN) 300 MG capsule Take 300 mg by mouth at bedtime.    [provider]  ketotifen (ALLERGY EYE DROPS) 0.025 % ophthalmic solution Place 1 drop into both eyes daily as needed (red eyes).    [provider]  levETIRAcetam (KEPPRA) 500 MG tablet Take 1 tablet (500 mg total) by mouth 2 (two) times daily. 11/28/21 02/28/22  Leslee Home, DO  loratadine (CLARITIN) 10 MG tablet Take 10 mg by mouth daily.    [provider]  Naproxen Sod-diphenhydrAMINE (ALEVE PM) 220-25 MG TABS Take 2 tablets by mouth at bedtime.    [provider]  nicotine (NICODERM CQ - DOSED IN MG/24 HOURS) 14 mg/24hr  patch Place 1 patch (14 mg total) onto the skin daily. Patient not taking: Reported on 02/28/2022 11/29/21   Leslee Home, DO  pantoprazole (PROTONIX) 40 MG tablet Take 40 mg by mouth daily before breakfast.     [provider]  polyethylene glycol (MIRALAX / GLYCOLAX) 17 g packet Take 17 g by mouth daily as needed for severe constipation. 02/09/20   Hosie Poisson, MD  Pseudoeph-Doxylamine-DM-APAP (NYQUIL PO) Take 5 mLs by mouth at bedtime.    [provider]  sodium chloride (OCEAN) 0.65 % SOLN nasal spray Place 1 spray into both nostrils daily as needed for congestion.    [provider]  sodium chloride 1 g tablet Take 1 tablet (1 g total) by mouth 3 (three) times daily with meals. 03/02/22 04/01/22  Barb Merino, MD  thiamine (VITAMIN B-1) 100 MG tablet Take 250 mg by mouth daily.    [provider]  triamcinolone cream (KENALOG) 0.1 % Apply 1 application  topically 2 (two) times daily as needed for skin breakdown. 10/13/21   [provider]      Allergies    Penicillins    Review of Systems   Review of Systems  Physical Exam Updated Vital Signs BP (!) 140/78   Pulse 74   Temp (!) 96.8 F (36 C) (Axillary)   Resp 17   SpO2 97%  Physical Exam Vitals and nursing note reviewed.  HENT:     Head:     Comments: Hematoma occipital area with no large laceration. Eyes:     Pupils: Pupils are equal, round, and reactive to light.  Neck:     Comments: Cervical collar in place without midline tenderness. Cardiovascular:     Rate and Rhythm: Regular rhythm.  Pulmonary:     Breath sounds: No wheezing or rhonchi.  Abdominal:     Tenderness: There is no abdominal tenderness.  Neurological:     Mental Status: He is alert.     Comments: Nonverbal.  Will not speak to me or necessarily follow commands.  Moves all extremities to pain.  Does move spontaneously.  Patient is looking around.     ED Results / Procedures / Treatments   Labs (all labs ordered are listed, but only abnormal results are displayed) Labs Reviewed  COMPREHENSIVE METABOLIC PANEL - Abnormal; Notable for the following components:      Result Value   Sodium 125 (*)    Chloride 90 (*)    Glucose, Bld 104 (*)    All other components within normal limits  CBC WITH DIFFERENTIAL/PLATELET - Abnormal; Notable for the following components:   RBC 3.98 (*)    HCT 36.2 (*)    MCHC 36.5 (*)    Abs Immature Granulocytes 0.09 (*)    All other components within normal limits  ETHANOL  AMMONIA  URINALYSIS, ROUTINE W REFLEX MICROSCOPIC    EKG EKG Interpretation  Date/Time:  Monday March 09 2022 13:12:16 EDT Ventricular Rate:  85 PR Interval:  191 QRS Duration: 85 QT Interval:  388 QTC Calculation: 462 R Axis:   16 Text Interpretation: Sinus rhythm Borderline T abnormalities, inferior leads Confirmed by Davonna Belling [phone]) on 03/09/2022 1:24:53 PM  Radiology DG Chest Portable 1 View  Result Date: 03/09/2022 CLINICAL DATA:   Fusion.  Fall.  Head trauma EXAM: PORTABLE CHEST 1 VIEW COMPARISON:  None Available. FINDINGS: Normal mediastinum and cardiac silhouette. Normal pulmonary vasculature. No evidence of effusion, infiltrate, or pneumothorax. No acute bony abnormality.  IMPRESSION: No acute cardiopulmonary process. Electronically Signed   By: Suzy Bouchard M.D.   On: 03/09/2022 14:46   CT HEAD WO CONTRAST (5MM)  Addendum Date: 03/09/2022   ADDENDUM REPORT: 03/09/2022 14:03 ADDENDUM: Discussed with Dr. Alvino Chapel on 03/09/22 at 2:03 PM Electronically Signed   By: Marin Roberts M.D.   On: 03/09/2022 14:03   Result Date: 03/09/2022 CLINICAL DATA:  Fall EXAM: CT HEAD WITHOUT CONTRAST TECHNIQUE: Contiguous axial images were obtained from the base of the skull through the vertex without intravenous contrast. RADIATION DOSE REDUCTION: This exam was performed according to the departmental dose-optimization program which includes automated exposure control, adjustment of the mA and/or kV according to patient size and/or use of iterative reconstruction technique. COMPARISON:  CT head 03/01/2022 and MRI brain 03/01/2022 FINDINGS: Brain: Sequela of severe chronic microvascular ischemic change with chronic infarct in the left corona radiata. There is advanced generalized volume loss. There is a 3 mm subdural hematoma along the left frontal convexity size and shape of the ventricular system is unchanged from prior. No evidence of intraventricular blood products. No evidence of hydrocephalus. No midline shift. Vascular: No hyperdense vessel or unexpected calcification. Skull: There is a right temporoparietal scalp soft tissue injury without evidence of underlying fracture. Sinuses/Orbits: Mild mucosal thickening of the base of bilateral maxillary sinuses. Other: None. IMPRESSION: 1. 3 mm subdural hematoma along the left frontal convexity. 2. Soft tissue laceration over the right temporoparietal scalp without evidence of underlying calvarial  fracture. Electronically Signed: By: Marin Roberts M.D. On: 03/09/2022 13:54   CT Cervical Spine Wo Contrast  Result Date: 03/09/2022 CLINICAL DATA:  Trauma EXAM: CT CERVICAL SPINE WITHOUT CONTRAST TECHNIQUE: Multidetector CT imaging of the cervical spine was performed without intravenous contrast. Multiplanar CT image reconstructions were also generated. RADIATION DOSE REDUCTION: This exam was performed according to the departmental dose-optimization program which includes automated exposure control, adjustment of the mA and/or kV according to patient size and/or use of iterative reconstruction technique. COMPARISON:  CT angio neck 03/01/2022 FINDINGS: Alignment: Straightening of cervical lordosis. Grade 1 anterolisthesis of C3 on C4. grade 1 anterolisthesis of C7 on T1. Multilevel severe facet degenerative changes. Skull base and vertebrae: No acute fracture. No primary bone lesion or focal pathologic process. Soft tissues and spinal canal: No prevertebral fluid or swelling. No visible canal hematoma. Disc levels: Multilevel degenerative disc disease with bone-on-bone articulation at C4-C5, C5-C6, and C6-C7. Upper chest: Paraseptal emphysema. Other: None IMPRESSION: No CT evidence of cervical spine fracture. Electronically Signed   By: Marin Roberts M.D.   On: 03/09/2022 14:01    Procedures Procedures    Medications Ordered in ED Medications  levETIRAcetam (KEPPRA) IVPB 1500 mg/ 100 mL premix (0 mg Intravenous Stopped 03/09/22 1433)  iohexol (OMNIPAQUE) 350 MG/ML injection 75 mL (75 mLs Intravenous Contrast Given 03/09/22 1528)    ED Course/ Medical Decision Making/ A&P                           Medical Decision Making Amount and/or Complexity of Data Reviewed Labs: ordered. Radiology: ordered.  Risk Prescription drug management.   Patient with fall.  Aphasic at this time.  Does have hematoma to scalp.  Moving extremities.  Reviewed neurology notes and recent admission notes.  Has had  recent stroke.  Also has had previous stroke and seizures.  Seizures have been accompanied with post ago.  With aphasia.  Do not think that it is likely an  acute stroke but head CT reassuring.  Will get CT angiography after discussing with neurology.  We will also load on Keppra since potentially noncontractile seizure.  We will get blood work and EKG is reassuring.  Will likely require admission to the hospital for long-term epileptic monitoring.  Head CT independently interpreted.  Also discussed with radiology and neurosurgery.  Has subdural in the left frontal area.  About 3 mm.  Discussed with Dr. Ronnald Ramp from neurosurgery.  Does not need acute intervention and does not necessarily need a repeat CT unless change in mental status. Patient's caregiver now with him.  Continues to be aphasic.  Is protecting airway however.  Moving all extremities.  No large laceration seen on hematoma does not appear to need closure at this time.  I think likely the aphasia is from the postictal period.  Will be seen by neurology.  Had been loaded on Keppra.  Hyponatremia also potentially a cause of the seizure.  Discussed with ICU about potential admission with subdural and continued neurodeficits.   CRITICAL CARE Performed by: Davonna Belling Total critical care time: 30 minutes Critical care time was exclusive of separately billable procedures and treating other patients. Critical care was necessary to treat or prevent imminent or life-threatening deterioration. Critical care was time spent personally by me on the following activities: development of treatment plan with patient and/or surrogate as well as nursing, discussions with consultants, evaluation of patient's response to treatment, examination of patient, obtaining history from patient or surrogate, ordering and performing treatments and interventions, ordering and review of laboratory studies, ordering and review of radiographic studies, pulse oximetry and  re-evaluation of patient's condition.         Final Clinical Impression(s) / ED Diagnoses Final diagnoses:  Fall, initial encounter  Subdural hematoma ([place])  Hyponatremia  Aphasia    Rx / DC Orders ED Discharge Orders     None         Davonna Belling, MD 03/09/22 [phone]

## 2022-03-09 NOTE — ED Notes (Signed)
Pt has a laceration with hematoma to the posterior scalp.

## 2022-03-09 NOTE — Consult Note (Signed)
Neurology Consultation  Reason for Consult: Aphasia following a seizure today Referring Physician: Dr. Alvino Chapel  CC: Patient unable to provide chief complaint due to aphasia  History is obtained from: Patient caregiver at bedside, chart review, unable to obtain from patient due to aphasia  HPI: Carlos Wise is a 81 y.o. 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  Patient was discharged on 9/18 with plan for outpatient neurology follow-up.  Patient does have reported right upper and lower extremity residual weakness.  Today, Carlos Wise caregiver heard a bang while cleaning downstairs and went to find Carlos Wise had fallen after getting out of bed. He was noted to be making an abnormal noise and had liquid foaming at the mouth for 1 to 2 minutes before resolution.  Patient caregiver does endorse compliance with antiepileptic drugs.  On arrival to the ED, patient remained aphasic and neurology was consulted for further evaluation.  ROS: Unable to obtain due to aphasia.  History obtained from patient's caregiver at bedside.  Past Medical History:  Diagnosis Date   Herpes zoster    Hyperlipemia    Hypertension    Intestinal polyp    hyperplastic   Prostate cancer ([place])    S/P radiation therapy 12/12/2014 through 02/06/2015                                                      Prostate 7800 cGy in 40 sessions, seminal vesicles 5600 cGy in 40 sessions                           Seizure disorder [facility])    Past Surgical History:  Procedure Laterality Date   Carotid Artery Catherization     PROSTATE BIOPSY  06/27/2014   Family  History  Problem Relation Age of Onset   Colon cancer Mother    Prostate cancer Father    Heart attack Father    Social History:   reports that he has quit smoking. He has never used smokeless tobacco. He reports current alcohol use. He reports that he does not use drugs.  Medications No current facility-administered medications for this encounter.  Current Outpatient Medications:    aspirin EC 81 MG tablet, Take 1 tablet (81 mg total) by mouth daily. Swallow whole., Disp: 30 tablet, Rfl: 12   atorvastatin (LIPITOR) 20 MG tablet, Take 1 tablet (20 mg total) by mouth daily., Disp: 30 tablet, Rfl: 11   bismuth subsalicylate (PEPTO BISMOL) 262 MG/15ML suspension, Take 30 mLs by mouth every 6 (six) hours as needed for indigestion., Disp: , Rfl:    cholecalciferol (VITAMIN D3) 25 MCG (1000 UNIT) tablet, Take 1,000 Units by mouth daily., Disp: , Rfl:    clobetasol (TEMOVATE) 0.05 % external solution, Apply 1 application  topically 2 (two) times daily as needed for itching., Disp: , Rfl:    Cyanocobalamin (VITAMIN B-12 ER PO), Take 1,000 mcg by mouth daily., Disp: , Rfl:    escitalopram (LEXAPRO) 10 MG tablet, Take  1 tablet (10 mg total) by mouth daily., Disp: 30 tablet, Rfl: 5   fluticasone (FLONASE) 50 MCG/ACT nasal spray, Place 1 spray into both nostrils daily., Disp: , Rfl:    folic acid (FOLVITE) 1 MG tablet, Take 1 mg by mouth daily., Disp: , Rfl:    gabapentin (NEURONTIN) 300 MG capsule, Take 300 mg by mouth at bedtime., Disp: , Rfl:    ketotifen (ALLERGY EYE DROPS) 0.025 % ophthalmic solution, Place 1 drop into both eyes daily as needed (red eyes)., Disp: , Rfl:    levETIRAcetam (KEPPRA) 500 MG tablet, Take 1 tablet (500 mg total) by mouth 2 (two) times daily., Disp: 60 tablet, Rfl: 0   loratadine (CLARITIN) 10 MG tablet, Take 10 mg by mouth daily., Disp: , Rfl:    Naproxen Sod-diphenhydrAMINE (ALEVE PM) 220-25 MG TABS, Take 2 tablets by mouth at bedtime., Disp: , Rfl:    nicotine  (NICODERM CQ - DOSED IN MG/24 HOURS) 14 mg/24hr patch, Place 1 patch (14 mg total) onto the skin daily. (Patient not taking: Reported on 02/28/2022), Disp: 28 patch, Rfl: 0   pantoprazole (PROTONIX) 40 MG tablet, Take 40 mg by mouth daily before breakfast. , Disp: , Rfl:    polyethylene glycol (MIRALAX / GLYCOLAX) 17 g packet, Take 17 g by mouth daily as needed for severe constipation., Disp: 14 each, Rfl: 0   Pseudoeph-Doxylamine-DM-APAP (NYQUIL PO), Take 5 mLs by mouth at bedtime., Disp: , Rfl:    sodium chloride (OCEAN) 0.65 % SOLN nasal spray, Place 1 spray into both nostrils daily as needed for congestion., Disp: , Rfl:    sodium chloride 1 g tablet, Take 1 tablet (1 g total) by mouth 3 (three) times daily with meals., Disp: 90 tablet, Rfl: 0   thiamine (VITAMIN B-1) 100 MG tablet, Take 250 mg by mouth daily., Disp: , Rfl:    triamcinolone cream (KENALOG) 0.1 %, Apply 1 application  topically 2 (two) times daily as needed for skin breakdown., Disp: , Rfl:   Exam: Current vital signs: BP (!) 146/85   Pulse 77   Temp (!) 96.8 F (36 C) (Axillary)   Resp 14   SpO2 99%  Vital signs in last 24 hours: Temp:  [96.8 F (36 C)] 96.8 F (36 C) (09/25 1258) Pulse Rate:  [74-81] 77 (09/25 1600) Resp:  [14-17] 14 (09/25 1600) BP: (140-165)/(75-91) 146/85 (09/25 1600) SpO2:  [96 %-99 %] 99 % (09/25 1600)  GENERAL: Drowsy, wakes to voice, in no acute distress Psych: Patient is calm with some minimal restless movements with stimulation Head: Hematoma noted to the occipital area EENT: Normal conjunctivae, dry mucous membranes, no OP obstruction LUNGS: Normal respiratory effort. Non-labored breathing on room air CV: Regular rate and rhythm on telemetry ABDOMEN: Soft, non-tender, non-distended Extremities: Warm, well perfused, without obvious deformity  NEURO:  Mental Status: Intermittently drowsy, wakes easily to voice. He initially is nonverbal and the only attempted communication is  nonsensical speech. He does not follow commands, does attempt to mimic examiner intermittently. Cranial Nerves:  II: PERRL.   III, IV, VI: Conservation officer, nature throughout visual fields. V: Blinks to threat throughout VII: Face appears symmetric resting and with movement VIII: Hearing intact to voice IX, X: Unable to assess palate elevation, phonation intact XI: Head is grossly midline XII: Tongue protrudes midline  Motor: Patient does not cooperate with confrontational strength testing. He does seem to have some minimal weakness on the right upper and lower extremities compared to the left with  spontaneous and mimicking movements. Tone is normal. Bulk is normal.  Sensation: Withdraws to noxious stimuli throughout Coordination: Does not perform Gait: Deferred for patient's safety  Labs I have reviewed labs in epic and the results pertinent to this consultation are: CBC    Component Value Date/Time   WBC 7.9 03/09/2022 1310   RBC 3.98 (L) 03/09/2022 1310   HGB 13.2 03/09/2022 1310   HCT 36.2 (L) 03/09/2022 1310   PLT 302 03/09/2022 1310   MCV 91.0 03/09/2022 1310   MCH 33.2 03/09/2022 1310   MCHC 36.5 (H) 03/09/2022 1310   RDW 12.0 03/09/2022 1310   LYMPHSABS 1.3 03/09/2022 1310   MONOABS 0.7 03/09/2022 1310   EOSABS 0.2 03/09/2022 1310   BASOSABS 0.1 03/09/2022 1310   CMP     Component Value Date/Time   NA 125 (L) 03/09/2022 1310   K 4.7 03/09/2022 1310   CL 90 (L) 03/09/2022 1310   CO2 25 03/09/2022 1310   GLUCOSE 104 (H) 03/09/2022 1310   BUN 9 03/09/2022 1310   CREATININE 0.95 03/09/2022 1310   CALCIUM 9.4 03/09/2022 1310   PROT 6.8 03/09/2022 1310   ALBUMIN 4.1 03/09/2022 1310   AST 32 03/09/2022 1310   ALT 13 03/09/2022 1310   ALKPHOS 63 03/09/2022 1310   BILITOT 0.8 03/09/2022 1310   GFRNONAA >60 03/09/2022 1310   GFRAA >60 02/08/2020 0408   Lipid Panel     Component Value Date/Time   CHOL 229 (H) 03/02/2022 0507   TRIG 98 03/02/2022 0507   HDL 48  03/02/2022 0507   CHOLHDL 4.8 03/02/2022 0507   VLDL 20 03/02/2022 0507   LDLCALC 161 (H) 03/02/2022 0507   Lab Results  Component Value Date   HGBA1C 5.1 03/02/2022   Imaging I have reviewed the images obtained:  CT-scan of the brain 9/25: 1. 3 mm subdural hematoma along the left frontal convexity. 2. Soft tissue laceration over the right temporoparietal scalp without evidence of underlying calvarial fracture.  CT angio head and neck wwo contrast 9/25: 1. No intracranial large vessel occlusion or no hemodynamically significant stenosis in the neck. 2. 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 No residual aneurysm identified. 3. Please see same-day noncontrast enhanced CT brain for additional findings, including a 3 mm left frontal convexity subdural hematoma. 4. Redemonstrated right parietal scalp soft tissue laceration.  Assessment: 81 year old male with past medical history significant for alcohol and tobacco use, seizure disorder, chronic hyponatremia, hypertension, prostate cancer s/p radiation, right ACA aneurysm s/p coiling and recent left corona radiata lacunar infarction on 9/16 who presented to the ED for evaluation of seizure-like activity.  This morning, patient's caregiver heard blood and witnessed the patient foaming at the mouth, making abnormal noises for 1 to 2 minutes before resolution.  Following this event the patient has been aphasic.  Patient still uses alcohol daily and caregiver endorses that he takes his medications as prescribed. - Examination reveals patient with global aphasia. He does not attempt to speak throughout most of the assessment and during his one attempt, his speech was nonsensical. He does not follow commands but intermittently attempts to mimic examiner. Does not perform confrontational strength testing but caregiver at bedside states that he has some minimal right upper and lower extremity  residual weakness from recent left corona radiata infarction.  -Presentation is felt to be most consistent with seizure and post-ictal aphasia.  Will obtain EEG.  Patient to be admitted to the  ICU and placed on phenobarbital protocol overnight for alcohol withdrawal due to multiple risk factors for ongoing seizure activity including seizure history, alcohol abuse, chronic hyponatremia.  - Small subdural identified on CT imaging along the left frontal convexity. No intervention per neurosurgery in the absence of neurologic decline.   Recommendations: - Overnight EEG monitoring  - Seizure precautions - 2 mg IV Ativan stat for seizure activity lasting > 3 minutes and notify neurology - Increase Keppra dose to 1,000 mg BID - Phenobarbital per PCCM for alcohol withdrawal  - Counseled on ETOH abuse though patient's caregiver indicates that the patient has not expressed desire to stop drinking or smoking  - Prior to discharge seizure precautions: [facility] statutes, patients with seizures are not allowed to drive until they have been seizure-free for six months. Use caution when using heavy equipment or power tools. Avoid working on ladders or at heights. Take showers instead of baths. Ensure the water temperature is not too high on the home water heater. Do not go swimming alone. Do not lock yourself in a room alone (i.e. bathroom). When caring for infants or small children, sit down when holding, feeding, or changing them to minimize risk of injury to the child in the event you have a seizure. Maintain good sleep hygiene. Avoid alcohol.   Pt seen by NP/Neuro and later by MD. Note/plan to be edited by MD as needed.  Anibal Henderson, AGAC-NP [facility] Pager: [phone]  NEUROHOSPITALIST ADDENDUM Performed a face to face diagnostic evaluation.   I have reviewed the contents of history and physical exam as documented by PA/ARNP/Resident and agree with above documentation.  I  have discussed and formulated the above plan as documented. Edits to the note have been made as needed.  Impression/Key exam findings/Plan: Likely had a seizure and post ictal. Continues to drink and gets belligerent if family attempts to withhold EtOH. POA has been mixing water with gin and gradually weaning off EtOh. Imaging shows a small left SDH. Given persistent aphasia on exam, he was loaded with Keppra and will get him on LTM for further seizure evaluation.  Donnetta Simpers, MD [facility] [phone]   If 7pm to 7am, please call on call as listed on AMION.

## 2022-03-09 NOTE — ED Notes (Signed)
ED TO INPATIENT HANDOFF REPORT  ED Nurse Name and Phone #: Andee Poles [place] Name/Age/Gender Carlos Wise 81 y.o. male Room/Bed: 030C/030C  Code Status   Code Status: Full Code  Home/SNF/Other Home Patient oriented to: disoriented x4, unable to assess pt is nonverbal Is this baseline? No   Triage Complete: Triage complete  Chief Complaint Acute metabolic encephalopathy [Z61.09]  Triage Note Pt BIB EMS from home. Pt was showering when he had an unwitnessed fall. Pt has injury to the back of the head, denies any other injuries. Pt has slurred speech and right sided weakness from previous stroke.   EMS Vitals 150/84 HR 70 95% on RA CBG 110   Allergies Allergies  Allergen Reactions   Penicillins Other (See Comments)    Patient states no allergy, but has preference that he does not want this medication    Level of Care/Admitting Diagnosis ED Disposition     ED Disposition  Admit   Condition  --   Wise: Carlos [[id number]]  Level of Care: ICU [6]  May admit patient to [facility] or [facility] if equivalent level of care is available:: No  Covid Evaluation: Asymptomatic - no recent exposure (last 10 days) testing not required  Diagnosis: Acute metabolic encephalopathy [[id number]]  Admitting Physician: Candee Furbish [[id number]]  Attending Physician: Candee Furbish [[id number]]  Certification:: I certify this patient will need inpatient services for at least 2 midnights  Estimated Length of Stay: 4          B Medical/Surgery History Past Medical History:  Diagnosis Date   Herpes zoster    Hyperlipemia    Hypertension    Intestinal polyp    hyperplastic   Prostate cancer ([place])    S/P radiation therapy 12/12/2014 through 02/06/2015                                                      Prostate 7800 cGy in 40 sessions, seminal vesicles 5600 cGy in 40 sessions                           Seizure disorder [facility])    Past  Surgical History:  Procedure Laterality Date   Carotid Artery Catherization     PROSTATE BIOPSY  06/27/2014     A IV Location/Drains/Wounds Patient Lines/Drains/Airways Status     Active Line/Drains/Airways     Name Placement date Placement time Site Days   Peripheral IV 03/09/22 20 G Right Antecubital 03/09/22  1311  Antecubital  less than 1            Intake/Output Last 24 hours  Intake/Output Summary (Last 24 hours) at 03/09/2022 1808 Last data filed at 03/09/2022 1744 Gross per 24 hour  Intake 1205.01 ml  Output --  Net 1205.01 ml    Labs/Imaging Results for orders placed or performed during the hospital encounter of 03/09/22 (from the past 48 hour(s))  Comprehensive metabolic panel     Status: Abnormal   Collection Time: 03/09/22  1:10 PM  Result Value Ref Range   Sodium 125 (L) 135 - 145 mmol/L   Potassium 4.7 3.5 - 5.1 mmol/L   Chloride 90 (L) 98 - 111 mmol/L   CO2 25 22 - 32 mmol/L  Glucose, Bld 104 (H) 70 - 99 mg/dL    Comment: Glucose reference range applies only to samples taken after fasting for at least 8 hours.   BUN 9 8 - 23 mg/dL   Creatinine, Ser 0.95 0.61 - 1.24 mg/dL   Calcium 9.4 8.9 - 10.3 mg/dL   Total Protein 6.8 6.5 - 8.1 g/dL   Albumin 4.1 3.5 - 5.0 g/dL   AST 32 15 - 41 U/L   ALT 13 0 - 44 U/L   Alkaline Phosphatase 63 38 - 126 U/L   Total Bilirubin 0.8 0.3 - 1.2 mg/dL   GFR, Estimated >60 >60 mL/min    Comment: (NOTE) Calculated using the CKD-EPI Creatinine Equation (2021)    Anion gap 10 5 - 15    Comment: Performed at [facility] [address] [place], [place] [address]  Ethanol     Status: None   Collection Time: 03/09/22  1:10 PM  Result Value Ref Range   Alcohol, Ethyl (B) <10 <10 mg/dL    Comment: (NOTE) Lowest detectable limit for serum alcohol is 10 mg/dL.  For medical purposes only. Performed at [facility], [place] [address], [place], [place] [address]   CBC with Differential     Status: Abnormal    Collection Time: 03/09/22  1:10 PM  Result Value Ref Range   WBC 7.9 4.0 - 10.5 K/uL   RBC 3.98 (L) 4.22 - 5.81 MIL/uL   Hemoglobin 13.2 13.0 - 17.0 g/dL   HCT 36.2 (L) 39.0 - 52.0 %   MCV 91.0 80.0 - 100.0 fL   MCH 33.2 26.0 - 34.0 pg   MCHC 36.5 (H) 30.0 - 36.0 g/dL   RDW 12.0 11.5 - 15.5 %   Platelets 302 150 - 400 K/uL   nRBC 0.0 0.0 - 0.2 %   Neutrophils Relative % 71 %   Neutro Abs 5.6 1.7 - 7.7 K/uL   Lymphocytes Relative 16 %   Lymphs Abs 1.3 0.7 - 4.0 K/uL   Monocytes Relative 9 %   Monocytes Absolute 0.7 0.1 - 1.0 K/uL   Eosinophils Relative 2 %   Eosinophils Absolute 0.2 0.0 - 0.5 K/uL   Basophils Relative 1 %   Basophils Absolute 0.1 0.0 - 0.1 K/uL   Immature Granulocytes 1 %   Abs Immature Granulocytes 0.09 (H) 0.00 - 0.07 K/uL    Comment: Performed at [facility] [address] [place], [place] [address]  Ammonia     Status: None   Collection Time: 03/09/22  1:10 PM  Result Value Ref Range   Ammonia 31 9 - 35 umol/L    Comment: HEMOLYSIS AT THIS LEVEL MAY AFFECT RESULT Performed at [facility], [place] [address], [place], [place] [address]    CT ANGIO HEAD NECK W WO CM  Result Date: 03/09/2022 CLINICAL DATA:  Stroke suspected EXAM: CT ANGIOGRAPHY HEAD AND NECK TECHNIQUE: Multidetector CT imaging of the head and neck was performed using the standard protocol during bolus administration of intravenous contrast. Multiplanar CT image reconstructions and MIPs were obtained to evaluate the vascular anatomy. Carotid stenosis measurements (when applicable) are obtained utilizing NASCET criteria, using the distal internal carotid diameter as the denominator. RADIATION DOSE REDUCTION: This exam was performed according to the departmental dose-optimization program which includes automated exposure control, adjustment of the mA and/or kV according to patient size and/or use of iterative reconstruction technique. CONTRAST:  67m OMNIPAQUE IOHEXOL 350 MG/ML SOLN  COMPARISON:  None Available. FINDINGS: CT HEAD FINDINGS Brain: Please see same-day noncontrast enhanced CT brain for additional findings. Vascular: See below Skull: Redemonstrated right parietal scalp laceration. Sinuses/Orbits: No acute finding. Other: None. Review of the MIP images confirms the above findings CTA NECK FINDINGS Aortic arch: Standard branching. Imaged portion shows no evidence of aneurysm or dissection. No significant stenosis of the major arch vessel origins. Right carotid system: Mild narrowing of the origin of the right internal carotid artery secondary to calcified atherosclerotic plaque. 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 No residual aneurysm identified. Left carotid system: No evidence of dissection, stenosis (50% or greater), or occlusion. Vertebral arteries: Codominant. No evidence of dissection, stenosis (50% or greater), or occlusion. Skeleton: Negative. Other neck: Negative. Upper chest: Negative. Review of the MIP images confirms the above findings CTA HEAD FINDINGS Anterior circulation: No significant stenosis, proximal occlusion, aneurysm, or vascular malformation. Redemonstrated stenting and coiling of the right cavernous carotid. No residual aneurysm identified. Posterior circulation: No significant stenosis, proximal occlusion, aneurysm, or vascular malformation. Venous sinuses: As permitted by contrast timing, patent. Anatomic variants: None Review of the MIP images confirms the above findings IMPRESSION: 1. No intracranial large vessel occlusion or no hemodynamically significant stenosis in the neck. 2. 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 No residual aneurysm identified. 3. Please see same-day noncontrast enhanced CT brain for additional findings, including a 3 mm left frontal convexity subdural hematoma. 4. Redemonstrated right parietal scalp soft tissue laceration.  Electronically Signed   By: Marin Roberts M.D.   On: 03/09/2022 15:57   DG Chest Portable 1 View  Result Date: 03/09/2022 CLINICAL DATA:  Fusion.  Fall.  Head trauma EXAM: PORTABLE CHEST 1 VIEW COMPARISON:  None Available. FINDINGS: Normal mediastinum and cardiac silhouette. Normal pulmonary vasculature. No evidence of effusion, infiltrate, or pneumothorax. No acute bony abnormality. IMPRESSION: No acute cardiopulmonary process. Electronically Signed   By: Suzy Bouchard M.D.   On: 03/09/2022 14:46   CT HEAD WO CONTRAST (5MM)  Addendum Date: 03/09/2022   ADDENDUM REPORT: 03/09/2022 14:03 ADDENDUM: Discussed with Dr. Alvino Chapel on 03/09/22 at 2:03 PM Electronically Signed   By: Marin Roberts M.D.   On: 03/09/2022 14:03   Result Date: 03/09/2022 CLINICAL DATA:  Fall EXAM: CT HEAD WITHOUT CONTRAST TECHNIQUE: Contiguous axial images were obtained from the base of the skull through the vertex without intravenous contrast. RADIATION DOSE REDUCTION: This exam was performed according to the departmental dose-optimization program which includes automated exposure control, adjustment of the mA and/or kV according to patient size and/or use of iterative reconstruction technique. COMPARISON:  CT head 03/01/2022 and MRI brain 03/01/2022 FINDINGS: Brain: Sequela of severe chronic microvascular ischemic change with chronic infarct in the left corona radiata. There is advanced generalized volume loss. There is a 3 mm subdural hematoma along the left frontal convexity size and shape of the ventricular system is unchanged from prior. No evidence of intraventricular blood products. No evidence of hydrocephalus. No midline shift. Vascular: No hyperdense vessel or unexpected calcification. Skull: There is a right temporoparietal scalp soft tissue injury without evidence of underlying fracture. Sinuses/Orbits: Mild mucosal thickening of the base of bilateral maxillary sinuses. Other: None. IMPRESSION: 1. 3 mm subdural hematoma  along the left frontal convexity. 2. Soft tissue laceration over the right temporoparietal scalp without evidence of underlying calvarial fracture. Electronically Signed: By: Marin Roberts M.D. On: 03/09/2022 13:54   CT Cervical Spine Wo Contrast  Result Date: 03/09/2022 CLINICAL DATA:  Trauma EXAM: CT CERVICAL SPINE WITHOUT CONTRAST TECHNIQUE: Multidetector CT imaging of the cervical spine was performed without intravenous contrast. Multiplanar CT image reconstructions were also generated. RADIATION DOSE REDUCTION: This exam was performed according to the departmental dose-optimization program which includes automated exposure control, adjustment of the mA and/or kV according to patient size and/or use of iterative reconstruction technique. COMPARISON:  CT angio neck 03/01/2022 FINDINGS: Alignment: Straightening of cervical lordosis. Grade 1 anterolisthesis of C3 on C4. grade 1 anterolisthesis of C7 on T1. Multilevel severe facet degenerative changes. Skull base and vertebrae: No acute fracture. No primary bone lesion or focal pathologic process. Soft tissues and spinal canal: No prevertebral fluid or swelling. No visible canal hematoma. Disc levels: Multilevel degenerative disc disease with bone-on-bone articulation at C4-C5, C5-C6, and C6-C7. Upper chest: Paraseptal emphysema. Other: None IMPRESSION: No CT evidence of cervical spine fracture. Electronically Signed   By: Marin Roberts M.D.   On: 03/09/2022 14:01    Pending Labs Unresulted Labs (From admission, onward)     Start     Ordered   03/10/22 6010  Basic metabolic panel  Daily at 5am,   R      03/09/22 1632   03/10/22 0500  Magnesium  Daily at 5am,   R      03/09/22 1632   03/09/22 1633  Ethanol  Once,   R        03/09/22 1632   03/09/22 1633  Osmolality  Once,   R        03/09/22 1632   03/09/22 1633  Osmolality, urine  Once,   R        03/09/22 1632   03/09/22 1632  Urine drugs of abuse scrn w alc, routine (Ref Lab)  Once,   R         03/09/22 1632   03/09/22 1310  Urinalysis, Routine w reflex microscopic Urine, Clean Catch  Once,   URGENT        03/09/22 1310            Vitals/Pain Today's Vitals   03/09/22 1706 03/09/22 1730 03/09/22 1745 03/09/22 1759  BP:  (!) 152/95 (!) 142/83 (!) 142/83  Pulse:  80 79 79  Resp:  18 16   Temp: 98.6 F (37 C)     TempSrc: Oral     SpO2:  97% 98%     Isolation Precautions No active isolations  Medications Medications  docusate sodium (COLACE) capsule 100 mg (has no administration in time range)  polyethylene glycol (MIRALAX / GLYCOLAX) packet 17 g (has no administration in time range)  ondansetron (ZOFRAN) injection 4 mg (has no administration in time range)  PHENObarbital (LUMINAL) injection 130 mg (has no administration in time range)    Followed by  PHENObarbital (LUMINAL) injection 65 mg (has no administration in time range)    Followed by  PHENObarbital (LUMINAL) injection 65 mg (has no administration in time range)  LORazepam (ATIVAN) tablet 1-4 mg ( Oral See Alternative 03/09/22 1805)    Or  LORazepam (ATIVAN) injection 1-4 mg (1 mg Intravenous Given 03/09/22 1805)  thiamine (VITAMIN B1) tablet 100 mg ( Oral See Alternative 03/09/22 1703)    Or  thiamine (VITAMIN B1) injection 100 mg (100 mg Intravenous Given 9/32/35 5732)  folic acid (FOLVITE) tablet 1 mg (has no administration in time range)  multivitamin with minerals tablet 1 tablet (1 tablet Oral Not Given 03/09/22 1716)  fluticasone (FLONASE)  50 MCG/ACT nasal spray 1 spray (has no administration in time range)  ketotifen (ZADITOR) 0.025 % ophthalmic solution 1 drop (has no administration in time range)  nicotine (NICODERM CQ - dosed in mg/24 hours) patch 21 mg (21 mg Transdermal Patch Applied 03/09/22 1736)  sodium chloride (OCEAN) 0.65 % nasal spray 1 spray (has no administration in time range)  levETIRAcetam (KEPPRA) IVPB 1500 mg/ 100 mL premix (0 mg Intravenous Stopped 03/09/22 1433)  iohexol  (OMNIPAQUE) 350 MG/ML injection 75 mL (75 mLs Intravenous Contrast Given 03/09/22 1528)  sodium chloride 0.9 % bolus 1,000 mL (0 mLs Intravenous Stopped 03/09/22 1744)  thiamine (VITAMIN B1) injection 100 mg (100 mg Intravenous Given 03/09/22 1553)  PHENObarbital (LUMINAL) injection 130 mg (130 mg Intravenous Given 03/09/22 1704)    Mobility non-ambulatory Moderate fall risk   Focused Assessments    R Recommendations: See Admitting Provider Note  Report given to:   Additional Notes: Pt's caregiver at bedside, good historian. Per caregiver patient drinks gin and tonic daily, smokes a pack a day.

## 2022-03-09 NOTE — Progress Notes (Signed)
LTM EEG hooked up and running - no initial skin breakdown - push button tested - neuro notified. [facility] NOT monitoring. Pt is in ed

## 2022-03-09 NOTE — ED Triage Notes (Signed)
Pt BIB EMS from home. Pt was showering when he had an unwitnessed fall. Pt has injury to the back of the head, denies any other injuries. Pt has slurred speech and right sided weakness from previous stroke.   EMS Vitals 150/84 HR 70 95% on RA CBG 110

## 2022-03-09 NOTE — Progress Notes (Addendum)
LTM maint complete - no skin breakdown  Patient is now in room on 73M with mittens on both hands, patient's family recommend mittens due to strong potential to disconnect self from monitoring. [facility] monitored, Event button test confirmed by [facility].

## 2022-03-09 NOTE — Consult Note (Signed)
NAME:  Carlos Wise, MRN:  [id number], DOB:  05-Oct-1940, LOS: 0 ADMISSION DATE:  03/09/2022, CONSULTATION DATE:  03/09/2022 REFERRING MD:  Dr. Alvino Chapel  CHIEF COMPLAINT:  AMS, Unwitnessed Fall, Recent Stroke   History of Present Illness:  Carlos Wise is a 81 year old male with a  history of prostate cancer status post radiation therapy, essential hypertension, esophageal dysphagia and stricture, right ICA aneurysm status post coiling, seizure disorder, chronic hyponatremia, smoker, chronic daily alcohol user, and recent admission with neurology for left lacunar stroke (discharged on 9/18 from [facility] presented to the emergency department for evaluation status post unwitnessed fall with head strike, not on anticoagulation.  In the ED chemistry panel significant for hyponatremia with a sodium at 125, but otherwise within normal limits.  CT of head without contrast that demonstrated a 3 mm subdural hematoma along the left frontal convexity.  No evidence of midline shift.  No evidence of hydrocephalus, and the ventricular system is unchanged from prior.  There is also soft tissue laceration of the right temporoparietal scalp without evidence of underlying calvarial fracture.  CT scanning of cervical spine demonstrated age-appropriate degeneration, with no acute pathology.  At baseline patient is alert and oriented x 3, and walks with assistance of a walker.  He has residual right-sided weakness and procedure aphasia status post small left lacunar infarct.   Pertinent  Medical History  Chronic tobacco use, 1 pack/day Chronic alcohol use, currently 8 ounces of gin a day Chronic hyponatremia Seizure disorder Hypertension Hyperlipidemia Heart failure with preserved ejection fraction sSeizure disorder Depression Left lacunar infarct Aphasia  Malignant neoplasm of the prostate status post radiation therapy  Significant Hospital Events: Including procedures, antibiotic start and stop dates in  addition to other pertinent events   9/25-seen in ED by CCM team  Objective   Blood pressure (!) 140/78, pulse 74, temperature (!) 96.8 F (36 C), temperature source Axillary, resp. rate 17, SpO2 97 %.        Intake/Output Summary (Last 24 hours) at 03/09/2022 1542 Last data filed at 03/09/2022 1433 Gross per 24 hour  Intake 200 ml  Output --  Net 200 ml   There were no vitals filed for this visit.  Examination: General: Patient lying in bed accompanied by healthcare power of attorney, Carlos Wise, who is not family but has been taking care of patient for approximately a year HENT: ~2.5 cm laceration present on right temporoparietal scalp, hemostatic with associated hematoma.  Symmetric pupils, with brisk pupillary reflex present bilaterally. Lungs: Clear to auscultation bilaterally in all lung fields, no wheezes or crackles appreciated. Cardiovascular: Regular rate and rhythm, no murmurs rubs or gallops appreciated. Abdomen: Suprapubic tenderness (withdraws from pain), nondistended, soft, no signs of obvious trauma. Extremities: Able to move all 4 extremities Neuro: Able to blink eyes twice in response to command, able to squeeze both hands with 5 out of 5 strength.  Speaks when asked name/date/current location, but speech is intelligible. GU: No Foley in place   Assessment & Plan:   Subdural hematoma Status post unwitnessed fall at home when trying to get out of bed.  As visualized on CT scan of head.  Not on anticoagulation.  Neurosurgery consulted, and they believe that there are is no indication for routine repeat imaging unless there is neurological decline or focal deficits or any new signs or symptoms.  There is no indication for acute neurosurgical intervention at this time.  Consider mechanical fall versus fall secondary to seizure . -Consider repeat  head CT if neuro exam changes -Neurosurgery consulted, appreciate recs  Encephalopathy  Seizure disorder Patient is able to  physically respond to commands, with delay on right side.  Patient has expressive/receptive dysphagia.  His speech is unintelligible.  He is able to recognize caretaker who is at bedside, but cannot enunciate their name.  He is able to move all 4 extremities, and is protecting his own airway.  Patient is saturating at 98% on room air, he is normotensive, with normal respiratory rate.  No signs of respiratory distress.  Lungs are clear to auscultation in all lung fields bilaterally, with no wheezes/rales.   Patient was loaded with Keppra, 3000 mg in ED. Likely postictal, but important to consider hyponatremia as contributing factor.  Less likely mass effect from intracerebral hemorrhage given CT scan.  Less likely metabolic with ammonia level of 31, normal transaminases. -No need for oxygen therapy, or intubation at this time.  We will admit to ICU for monitoring. -Loaded with Keppra 3000 g -Long-term continuous EEG monitoring -Start Precedex  Acute on chronic hyponatremia Patient sodium at 125, will and was at 130 at time of discharge on 9/18.  Patient has been consistent with medications, including 1 g of sodium chloride tabs 3 times daily and a salt heavy diet.  Likely secondary to chronic alcohol use as well as Lexapro; important to consider SIADH. -Replete with isotonic fluids -Repeat BMP, and check BMP daily -SIADH labs (serum sodium, serum osmolality, urine sodium/osmolality, urine specific gravity) -Place Foley (patient has history of agitation status post Foley insertion) -strict I's and O's  Chronic alcohol use Patient has a longstanding use of alcohol, currently drinking 8 ounces of gin daily.  Per healthcare power of attorney was present at bedside, patient has a history of withdrawal when admitted to the hospital.  Given history of withdrawal with prior hospitalizations per healthcare power of attorney and current EtOH use, will treat prophylactically for withdrawal. -Phenobarbital taper  for alcohol withdrawal, will consult pharmacy for regimen -Continue home thiamine 250 mg tablet daily -Start folic acid replacement   Tobacco use disorder Chronic tobacco use per healthcare power of attorney was present at bedside and is the patient's caretaker.  Patient currently smokes 1 pack/day and is not interested in cessation at this time. -Nicotine patch  Depression Current regimen is 10 mg Lexapro daily.  -Hold while assessing etiology of hyponatremia  Best Practice (right click and "Reselect all SmartList Selections" daily)   Diet/type: NPO w/ oral meds DVT prophylaxis: SCD GI prophylaxis: Home Protonix 40 mg daily Lines: N/A Foley:  N/A Code Status:  full code Last date of multidisciplinary goals of care discussion [9/25 at bedside with healthcare power of attorney, Carlos Wise, who is patient's caretaker.]  Labs   CBC: Recent Labs  Lab 03/09/22 1310  WBC 7.9  NEUTROABS 5.6  HGB 13.2  HCT 36.2*  MCV 91.0  PLT 244    Basic Metabolic Panel: Recent Labs  Lab 03/09/22 1310  NA 125*  K 4.7  CL 90*  CO2 25  GLUCOSE 104*  BUN 9  CREATININE 0.95  CALCIUM 9.4   GFR: Estimated Creatinine Clearance: 58 mL/min (by C-G formula based on SCr of 0.95 mg/dL). Recent Labs  Lab 03/09/22 1310  WBC 7.9    Liver Function Tests: Recent Labs  Lab 03/09/22 1310  AST 32  ALT 13  ALKPHOS 63  BILITOT 0.8  PROT 6.8  ALBUMIN 4.1   No results for input(s): "LIPASE", "AMYLASE" in the last  168 hours. Recent Labs  Lab 03/09/22 1310  AMMONIA 31    ABG    Component Value Date/Time   HCO3 22.4 11/22/2021 1359   TCO2 26 02/28/2022 1701   O2SAT 100 11/22/2021 1359     Coagulation Profile: No results for input(s): "INR", "PROTIME" in the last 168 hours.  Cardiac Enzymes: No results for input(s): "CKTOTAL", "CKMB", "CKMBINDEX", "TROPONINI" in the last 168 hours.  HbA1C: Hgb A1c MFr Bld  Date/Time Value Ref Range Status  03/02/2022 05:07 AM 5.1 4.8 - 5.6 %  Final    Comment:    (NOTE) Pre diabetes:          5.7%-6.4%  Diabetes:              >6.4%  Glycemic control for   <7.0% adults with diabetes   02/04/2020 02:19 PM 5.6 4.8 - 5.6 % Final    Comment:    (NOTE) Pre diabetes:          5.7%-6.4%  Diabetes:              >6.4%  Glycemic control for   <7.0% adults with diabetes     CBG: No results for input(s): "GLUCAP" in the last 168 hours.   Past Medical History:  He,  has a past medical history of Herpes zoster, Hyperlipemia, Hypertension, Intestinal polyp, Prostate cancer ([place]), S/P radiation therapy (12/12/2014 through 02/06/2015                                                   ), and Seizure disorder ([place]).   Surgical History:   Past Surgical History:  Procedure Laterality Date   Carotid Artery Catherization     PROSTATE BIOPSY  06/27/2014     Social History:   Patient has a significant smoking history, and does not want to stop smoking or EtOH consumption per caretaker.  Patient currently smoking 1 pack/day, and drinking 8 ounces of gin daily.   Family History:  His family history includes Colon cancer in his mother; Heart attack in his father; Prostate cancer in his father.   Allergies Allergies  Allergen Reactions   Penicillins Other (See Comments)    Patient states no allergy, but has preference that he does not want this medication     Home Medications  Prior to Admission medications   Medication Sig Start Date End Date Taking? Authorizing Provider  aspirin EC 81 MG tablet Take 1 tablet (81 mg total) by mouth daily. Swallow whole. 03/03/22   Barb Merino, MD  atorvastatin (LIPITOR) 20 MG tablet Take 1 tablet (20 mg total) by mouth daily. 03/02/22 03/02/23  Barb Merino, MD  bismuth subsalicylate (PEPTO BISMOL) 262 MG/15ML suspension Take 30 mLs by mouth every 6 (six) hours as needed for indigestion.    [provider]  cholecalciferol (VITAMIN D3) 25 MCG (1000 UNIT) tablet Take 1,000 Units by  mouth daily.    [provider]  clobetasol (TEMOVATE) 0.05 % external solution Apply 1 application  topically 2 (two) times daily as needed for itching. 10/13/21   [provider]  Cyanocobalamin (VITAMIN B-12 ER PO) Take 1,000 mcg by mouth daily.    [provider]  escitalopram (LEXAPRO) 10 MG tablet Take 1 tablet (10 mg total) by mouth daily. 03/02/22 08/29/22  Barb Merino, MD  fluticasone (FLONASE) 50  MCG/ACT nasal spray Place 1 spray into both nostrils daily. 12/10/19   [provider]  folic acid (FOLVITE) 1 MG tablet Take 1 mg by mouth daily.    [provider]  gabapentin (NEURONTIN) 300 MG capsule Take 300 mg by mouth at bedtime.    [provider]  ketotifen (ALLERGY EYE DROPS) 0.025 % ophthalmic solution Place 1 drop into both eyes daily as needed (red eyes).    [provider]  levETIRAcetam (KEPPRA) 500 MG tablet Take 1 tablet (500 mg total) by mouth 2 (two) times daily. 11/28/21 02/28/22  Leslee Home, DO  loratadine (CLARITIN) 10 MG tablet Take 10 mg by mouth daily.    [provider]  Naproxen Sod-diphenhydrAMINE (ALEVE PM) 220-25 MG TABS Take 2 tablets by mouth at bedtime.    [provider]  nicotine (NICODERM CQ - DOSED IN MG/24 HOURS) 14 mg/24hr patch Place 1 patch (14 mg total) onto the skin daily. Patient not taking: Reported on 02/28/2022 11/29/21   Leslee Home, DO  pantoprazole (PROTONIX) 40 MG tablet Take 40 mg by mouth daily before breakfast.     [provider]  polyethylene glycol (MIRALAX / GLYCOLAX) 17 g packet Take 17 g by mouth daily as needed for severe constipation. 02/09/20   Hosie Poisson, MD  Pseudoeph-Doxylamine-DM-APAP (NYQUIL PO) Take 5 mLs by mouth at bedtime.    [provider]  sodium chloride (OCEAN) 0.65 % SOLN nasal spray Place 1 spray into both nostrils daily as needed for congestion.    [provider]  sodium chloride 1 g tablet Take 1 tablet  (1 g total) by mouth 3 (three) times daily with meals. 03/02/22 04/01/22  Barb Merino, MD  thiamine (VITAMIN B-1) 100 MG tablet Take 250 mg by mouth daily.    [provider]  triamcinolone cream (KENALOG) 0.1 % Apply 1 application  topically 2 (two) times daily as needed for skin breakdown. 10/13/21   [provider]     Critical care time:

## 2022-03-09 NOTE — ED Notes (Signed)
Order written for '3000mg'$  of Kepra, IV. Given two '1500mg'$  in 125m. Pump will not allow association. Pt given full dose of '3000mg'$  in 205m

## 2022-03-09 NOTE — ED Provider Notes (Signed)
3:48 PM Assumed care of patient from off-going team. For more details, please see note from same day.  In brief, this is a  CVA a few weeks ago, had strokes/seizures before, fall at home w/ hematoma on back of head. Small SDH. Found to be aphasic which has happened before. Loaded w/ Keppra. Na 124. Airway okay. Full code per caregiver  Plan/Dispo at time of sign-out & ED Course since sign-out: '[ ]'$  ICU '[ ]'$ neuro  BP (!) 140/78   Pulse 74   Temp (!) 96.8 F (36 C) (Axillary)   Resp 17   SpO2 97%       Dispo: Patient will be admitted to ICU on EEG monitoring and phenobarb for withdrawal ------------------------------- Cindee Lame, MD Emergency Medicine  This note was created using dictation software, which may contain spelling or grammatical errors.   Audley Hose, MD 03/14/22 1131

## 2022-03-10 ENCOUNTER — Inpatient Hospital Stay (HOSPITAL_COMMUNITY): Payer: Medicare HMO

## 2022-03-10 DIAGNOSIS — R1310 Dysphagia, unspecified: Secondary | ICD-10-CM | POA: Diagnosis not present

## 2022-03-10 DIAGNOSIS — G9340 Encephalopathy, unspecified: Secondary | ICD-10-CM

## 2022-03-10 DIAGNOSIS — S065XAA Traumatic subdural hemorrhage with loss of consciousness status unknown, initial encounter: Secondary | ICD-10-CM

## 2022-03-10 DIAGNOSIS — E440 Moderate protein-calorie malnutrition: Secondary | ICD-10-CM

## 2022-03-10 DIAGNOSIS — R569 Unspecified convulsions: Secondary | ICD-10-CM

## 2022-03-10 DIAGNOSIS — R4182 Altered mental status, unspecified: Secondary | ICD-10-CM

## 2022-03-10 DIAGNOSIS — G9341 Metabolic encephalopathy: Secondary | ICD-10-CM | POA: Diagnosis not present

## 2022-03-10 LAB — CBC WITH DIFFERENTIAL/PLATELET
Abs Immature Granulocytes: 0.04 10*3/uL (ref 0.00–0.07)
Basophils Absolute: 0.1 10*3/uL (ref 0.0–0.1)
Basophils Relative: 1 %
Eosinophils Absolute: 0.2 10*3/uL (ref 0.0–0.5)
Eosinophils Relative: 2 %
HCT: 35.2 % — ABNORMAL LOW (ref 39.0–52.0)
Hemoglobin: 12.9 g/dL — ABNORMAL LOW (ref 13.0–17.0)
Immature Granulocytes: 1 %
Lymphocytes Relative: 12 %
Lymphs Abs: 1.1 10*3/uL (ref 0.7–4.0)
MCH: 32.8 pg (ref 26.0–34.0)
MCHC: 36.6 g/dL — ABNORMAL HIGH (ref 30.0–36.0)
MCV: 89.6 fL (ref 80.0–100.0)
Monocytes Absolute: 0.7 10*3/uL (ref 0.1–1.0)
Monocytes Relative: 8 %
Neutro Abs: 6.7 10*3/uL (ref 1.7–7.7)
Neutrophils Relative %: 76 %
Platelets: 281 10*3/uL (ref 150–400)
RBC: 3.93 MIL/uL — ABNORMAL LOW (ref 4.22–5.81)
RDW: 12 % (ref 11.5–15.5)
WBC: 8.8 10*3/uL (ref 4.0–10.5)
nRBC: 0 % (ref 0.0–0.2)

## 2022-03-10 LAB — BASIC METABOLIC PANEL
Anion gap: 11 (ref 5–15)
Anion gap: 10 (ref 5–15)
BUN: 6 mg/dL — ABNORMAL LOW (ref 8–23)
BUN: 9 mg/dL (ref 8–23)
CO2: 25 mmol/L (ref 22–32)
CO2: 23 mmol/L (ref 22–32)
Calcium: 9.2 mg/dL (ref 8.9–10.3)
Calcium: 8.9 mg/dL (ref 8.9–10.3)
Chloride: 90 mmol/L — ABNORMAL LOW (ref 98–111)
Chloride: 93 mmol/L — ABNORMAL LOW (ref 98–111)
Creatinine, Ser: 0.82 mg/dL (ref 0.61–1.24)
Creatinine, Ser: 0.65 mg/dL (ref 0.61–1.24)
GFR, Estimated: >60 mL/min (ref >60)
GFR, Estimated: >60 mL/min (ref >60)
Glucose, Bld: 91 mg/dL (ref 70–99)
Glucose, Bld: 117 mg/dL — ABNORMAL HIGH (ref 70–99)
Potassium: 3.9 mmol/L (ref 3.5–5.1)
Potassium: 4 mmol/L (ref 3.5–5.1)
Sodium: 126 mmol/L — ABNORMAL LOW (ref 135–145)
Sodium: 126 mmol/L — ABNORMAL LOW (ref 135–145)

## 2022-03-10 LAB — GLUCOSE, CAPILLARY
Glucose-Capillary: 114 mg/dL — ABNORMAL HIGH (ref 70–99)
Glucose-Capillary: 119 mg/dL — ABNORMAL HIGH (ref 70–99)
Glucose-Capillary: 119 mg/dL — ABNORMAL HIGH (ref 70–99)
Glucose-Capillary: 88 mg/dL (ref 70–99)
Glucose-Capillary: 92 mg/dL (ref 70–99)
Glucose-Capillary: 93 mg/dL (ref 70–99)

## 2022-03-10 LAB — SODIUM, URINE, RANDOM: Sodium, Ur: 194 mmol/L

## 2022-03-10 LAB — MAGNESIUM: Magnesium: 2.1 mg/dL (ref 1.7–2.4)

## 2022-03-10 LAB — OSMOLALITY, URINE: Osmolality, Ur: 676 mOsm/kg (ref 300–900)

## 2022-03-10 MED ORDER — ATORVASTATIN CALCIUM 10 MG PO TABS
20.0000 mg | ORAL_TABLET | Freq: Every day | ORAL | Status: DC
  Administered 2022-03-10: 20 mg
  Filled 2022-03-10 (×2): qty 2

## 2022-03-10 MED ORDER — SODIUM CHLORIDE 1 G PO TABS
1.0000 g | ORAL_TABLET | Freq: Three times a day (TID) | ORAL | Status: DC
  Administered 2022-03-10 – 2022-03-22 (×37): 1 g
  Filled 2022-03-10 (×42): qty 1

## 2022-03-10 MED ORDER — SODIUM CHLORIDE 0.9 % IV SOLN: 1.0000 mg | Freq: Every day | INTRAVENOUS | Status: DC

## 2022-03-10 MED ORDER — ADULT MULTIVITAMIN W/MINERALS CH
1.0000 | ORAL_TABLET | Freq: Every day | ORAL | Status: DC
  Administered 2022-03-11 – 2022-03-22 (×12): 1
  Filled 2022-03-10 (×11): qty 1

## 2022-03-10 MED ORDER — PROSOURCE TF20 ENFIT COMPATIBL EN LIQD
60.0000 mL | Freq: Every day | ENTERAL | Status: DC
  Administered 2022-03-10 – 2022-03-22 (×13): 60 mL
  Filled 2022-03-10 (×12): qty 60

## 2022-03-10 MED ORDER — ASPIRIN 81 MG PO CHEW
81.0000 mg | CHEWABLE_TABLET | Freq: Every day | ORAL | Status: DC
  Administered 2022-03-10 – 2022-03-22 (×13): 81 mg
  Filled 2022-03-10 (×13): qty 1

## 2022-03-10 MED ORDER — VITAMIN D 25 MCG (1000 UNIT) PO TABS
1000.0000 [IU] | ORAL_TABLET | Freq: Every day | ORAL | Status: DC
  Administered 2022-03-10 – 2022-03-22 (×13): 1000 [IU]
  Filled 2022-03-10 (×14): qty 1

## 2022-03-10 MED ORDER — FOLIC ACID 5 MG/ML IJ SOLN
1.0000 mg | Freq: Every day | INTRAMUSCULAR | Status: DC
  Administered 2022-03-10: 1 mg via INTRAVENOUS
  Filled 2022-03-10: qty 0.2

## 2022-03-10 MED ORDER — GABAPENTIN 250 MG/5ML PO SOLN
150.0000 mg | Freq: Every day | ORAL | Status: DC
  Administered 2022-03-10 – 2022-03-22 (×13): 150 mg
  Filled 2022-03-10 (×13): qty 4

## 2022-03-10 MED ORDER — PHENOBARBITAL 32.4 MG PO TABS: 32.4000 mg | ORAL_TABLET | Freq: Three times a day (TID) | ORAL | Status: DC

## 2022-03-10 MED ORDER — VITAL HIGH PROTEIN PO LIQD: 1000.0000 mL | ORAL | Status: DC

## 2022-03-10 MED ORDER — PHENOBARBITAL 32.4 MG PO TABS
97.2000 mg | ORAL_TABLET | Freq: Three times a day (TID) | ORAL | Status: DC
  Administered 2022-03-10 – 2022-03-11 (×3): 97.2 mg
  Filled 2022-03-10 (×3): qty 3

## 2022-03-10 MED ORDER — POLYETHYLENE GLYCOL 3350 17 G PO PACK: 17.0000 g | PACK | Freq: Every day | ORAL | Status: DC | PRN

## 2022-03-10 MED ORDER — THIAMINE MONONITRATE 100 MG PO TABS
100.0000 mg | ORAL_TABLET | Freq: Every day | ORAL | Status: DC
  Administered 2022-03-11 – 2022-03-22 (×12): 100 mg
  Filled 2022-03-10 (×12): qty 1

## 2022-03-10 MED ORDER — PANTOPRAZOLE 2 MG/ML SUSPENSION
40.0000 mg | Freq: Every day | ORAL | Status: DC
  Administered 2022-03-10 – 2022-03-22 (×13): 40 mg
  Filled 2022-03-10 (×14): qty 20

## 2022-03-10 MED ORDER — SODIUM CHLORIDE 0.9 % IV SOLN
1.0000 g | INTRAVENOUS | Status: AC
  Administered 2022-03-10 – 2022-03-12 (×3): 1 g via INTRAVENOUS
  Filled 2022-03-10 (×4): qty 10

## 2022-03-10 MED ORDER — VITAL 1.5 CAL PO LIQD
1000.0000 mL | ORAL | Status: DC
  Administered 2022-03-10 – 2022-03-17 (×10): 1000 mL
  Filled 2022-03-10 (×13): qty 1000

## 2022-03-10 MED ORDER — FOLIC ACID 1 MG PO TABS
1.0000 mg | ORAL_TABLET | Freq: Every day | ORAL | Status: DC
  Administered 2022-03-10 – 2022-03-22 (×13): 1 mg
  Filled 2022-03-10 (×13): qty 1

## 2022-03-10 MED ORDER — DOCUSATE SODIUM 50 MG/5ML PO LIQD: 100.0000 mg | Freq: Two times a day (BID) | ORAL | Status: DC | PRN

## 2022-03-10 MED ORDER — PHENOBARBITAL 32.4 MG PO TABS: 64.8000 mg | ORAL_TABLET | Freq: Three times a day (TID) | ORAL | Status: DC

## 2022-03-10 MED ORDER — SODIUM CHLORIDE 0.9 % IV BOLUS
500.0000 mL | Freq: Once | INTRAVENOUS | Status: AC
  Administered 2022-03-10: 500 mL via INTRAVENOUS

## 2022-03-10 MED ORDER — VITAMIN B-12 100 MCG PO TABS
100.0000 ug | ORAL_TABLET | Freq: Every day | ORAL | Status: DC
  Administered 2022-03-10 – 2022-03-22 (×13): 100 ug
  Filled 2022-03-10 (×14): qty 1

## 2022-03-10 MED ORDER — LEVETIRACETAM IN NACL 1000 MG/100ML IV SOLN
1000.0000 mg | Freq: Two times a day (BID) | INTRAVENOUS | Status: DC
  Administered 2022-03-10 – 2022-03-11 (×3): 1000 mg via INTRAVENOUS
  Filled 2022-03-10 (×4): qty 100

## 2022-03-10 NOTE — Procedures (Addendum)
Patient Name: ALLIE GERHOLD  MRN: [id number]  Epilepsy Attending: Lora Havens  Referring Physician/Provider: Donnetta Simpers, MD  Duration: 03/09/2022 1704 to 03/10/2022 1351  Patient history:  81 year old male with past medical history significant for alcohol and tobacco use, seizure disorder, chronic hyponatremia, hypertension, prostate cancer s/p radiation, right ACA aneurysm s/p coiling and recent left corona radiata lacunar infarction on 9/16 who presented to the ED for evaluation of seizure-like activity. EEG to evaluate for seizure  Level of alertness:  lethargic   AEDs during EEG study: Phenobarb  Technical aspects: This EEG study was done with scalp electrodes positioned according to the 10-20 International system of electrode placement. Electrical activity was reviewed with band pass filter of 1-'70Hz'$ , sensitivity of 7 uV/mm, display speed of 85m/sec with a '60Hz'$  notched filter applied as appropriate. EEG data were recorded continuously and digitally stored.  Video monitoring was available and reviewed as appropriate.  Description: EEG showed continuous generalized and lateralized left hemisphere 3 to 7 Hz theta and delta slowing.  Additionally there was continuous low amplitude 2 to 3 Hz delta slowing in the right temporal region.  Hyperventilation and photic stimulation were not performed.     ABNORMALITY - Continuous slow, generalized and lateralized left hemisphere -Continuous slow, right temporal region  IMPRESSION: This study is suggestive of cortical dysfunction in left hemisphere likely secondary to underlying structural abnormality.  Additionally there is also cortical dysfunction in right temporal region likely secondary to underlying structural abnormality.  Lastly there is moderate diffuse encephalopathy, nonspecific etiology. No seizures or definite epileptiform discharges were seen throughout the recording.   Livana Yerian OBarbra Sarks

## 2022-03-10 NOTE — Progress Notes (Signed)
Initial Nutrition Assessment  DOCUMENTATION CODES:   Non-severe (moderate) malnutrition in context of social or environmental circumstances  INTERVENTION:   Initiate tube feeds via Cortrak: - Start Vital 1.5 @ 30 ml/hr and advance by 10 ml q 6 hours to goal rate of 55 ml/hr (1320 ml/day) - PROSource TF20 60 ml daily  Tube feeding regimen at goal provides 2060 kcal, 109 grams of protein, and 1008 ml of H2O.  Monitor magnesium, potassium, and phosphorus BID for at least 3 days, MD to replete as needed, as pt is at risk for refeeding syndrome given malnutrition.  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  GOAL:   Patient will meet greater than or equal to 90% of their needs  MONITOR:   Diet advancement, Labs, Weight trends, TF tolerance  REASON FOR ASSESSMENT:   Consult Enteral/tube feeding initiation and management  ASSESSMENT:   81 year old male who presented to the ED on 9/25 after an unwitnessed fall. PMH of R ICA aneurysm s/p coiling, stroke, HLD, HTN, EtOH abuse, tobacco abuse, seizures, prostate cancer s/p radiation therapy, esophageal dysphagia and stricture. Pt admitted with acute encephalopathy in setting of seizure activity, SDH.  06/26 - Cortrak placed (tip gastric)  Discussed pt with RN. Cortrak placed this morning, tip gastric. Consult received for enteral nutrition initiation and management.  No family present at time of RD visit. Unable to obtain diet and weight history at this time. Per review of notes, pt consumes 8 oz of gin daily but caregiver had been slowly diluting with water over time to decrease overall EtOH intake. Pt meets criteria for malnutrition. Will start tube feeds at low rate and slowly advance to goal. RN aware of plan.  Reviewed weight history in chart. Pt with a 6.5  kg weight loss since 11/22/21. This is a 7.5% weight loss in 3.5 months which is not quite clinically significant for timeframe.  Medications reviewed and include: cholecalciferol 9417 units daily, folic acid, MVI with minerals daily, protonix, phenobarbital taper, sodium chloride 1 gram TID, thiamine, vitamin B-12 100 mcg daily, IV abx  Labs reviewed: sodium 126 CBG's: 88-93 x 24 hours  UOP: 800 ml x 12 hours I/O's: +365 ml since admit  NUTRITION - FOCUSED PHYSICAL EXAM:  Flowsheet Row Most Recent Value  Orbital Region Moderate depletion  Upper Arm Region Moderate depletion  Thoracic and Lumbar Region Moderate depletion  Buccal Region Mild depletion  Temple Region Moderate depletion  Clavicle Bone Region Moderate depletion  Clavicle and Acromion Bone Region Moderate depletion  Scapular Bone Region Moderate depletion  Dorsal Hand Mild depletion  Patellar Region Moderate depletion  Anterior Thigh Region Severe depletion  Posterior Calf Region Severe depletion  Edema (RD Assessment) None  Hair Reviewed  Eyes Reviewed  Mouth Reviewed  Skin Reviewed  Nails Reviewed       Diet Order:   Diet Order             Diet NPO time specified  Diet effective now                   EDUCATION NEEDS:   Not appropriate for education at this time  Skin:  Skin Assessment: Reviewed RN Assessment  Last BM:  no documented BM  Height:   Ht Readings from Last 1 Encounters:  02/28/22 '5\' 7"'$  (1.702 m)  Weight:   Wt Readings from Last 1 Encounters:  03/10/22 79.7 kg    BMI:  Body mass index is 27.52 kg/m.  Estimated Nutritional Needs:   Kcal:  1900-2100  Protein:  95-110 grams  Fluid:  1.9-2.1 L    Gustavus Bryant, MS, RD, LDN Inpatient Clinical Dietitian Please see [facility] for contact information.

## 2022-03-10 NOTE — Progress Notes (Signed)
EEG D/C'd. Patient had no skin break down. [facility] notified.  

## 2022-03-10 NOTE — Progress Notes (Addendum)
NEUROLOGY CONSULTATION PROGRESS NOTE   Date of service: March 10, 2022 Patient Name: Carlos Wise MRN:  [id number] DOB:  March 31, 1941   Interval Hx   Mumbling today, does not follow commands for me. Per RN, was cussing out staff very clearly when changing clothes last night.  Vitals   Vitals:   03/10/22 0245 03/10/22 0300 03/10/22 0500 03/10/22 0750  BP: (!) 153/84 (!) 140/86    Pulse: 78 77    Resp: 12 16    Temp:    97.7 F (36.5 C)  TempSrc:    Axillary  SpO2: 97% 98%    Weight:   79.7 kg      Body mass index is 27.52 kg/m.  Physical Exam   General: Laying comfortably in bed; in no acute distress. HENT: Normal oropharynx and mucosa. Normal external appearance of ears and nose.  Neck: Supple, no pain or tenderness  CV: No JVD. No peripheral edema.  Pulmonary: Symmetric Chest rise. Normal respiratory effort.  Abdomen: Soft to touch, non-tender.  Ext: No cyanosis, edema, or deformity  Skin: No rash. Normal palpation of skin.   Musculoskeletal: Normal digits and nails by inspection. No clubbing.   Neurologic Examination  Mental status/Cognition: opens eyes to tactile stimuli. Makes eye contact. Mumbles, does not follow commands or answer any orientation questions. Speech/language: mumbles, unintelligible, does not follow commands. Cranial nerves:   CN II Pupils equal and reactive to light, makes eye contact on left and right   CN III,IV,VI Looks left and right, no gaze preference or deviation, no nystagmus   CN V normal sensation in V1, V2, and V3 segments bilaterally   CN VII no asymmetry, no nasolabial fold flattening   CN VIII Turns head towards speech   CN IX & X Unable to assess but awake and protecting airway   CN XI Head midline.   CN XII midline tongue but does not protrude on command   Motor:  Muscle bulk: , tone normal  Both hands in mitts.  Hold both upper extremities off the bed.  Can hold both legs off the bed.  Sensation: Localizes to pinch  in all extremities  Coordination/Complex Motor:  - unable to assess.  Labs   Basic Metabolic Panel:  Lab Results  Component Value Date   NA 126 (L) 03/10/2022   K 3.9 03/10/2022   CO2 25 03/10/2022   GLUCOSE 91 03/10/2022   BUN 6 (L) 03/10/2022   CREATININE 0.82 03/10/2022   CALCIUM 9.2 03/10/2022   GFRNONAA >60 03/10/2022   GFRAA >60 02/08/2020   HbA1c:  Lab Results  Component Value Date   HGBA1C 5.1 03/02/2022   LDL:  Lab Results  Component Value Date   LDLCALC 161 (H) 03/02/2022   Urine Drug Screen:     Component Value Date/Time   LABOPIA NONE DETECTED 11/23/2021 0304   COCAINSCRNUR NONE DETECTED 11/23/2021 0304   LABBENZ NONE DETECTED 11/23/2021 0304   AMPHETMU NONE DETECTED 11/23/2021 0304   THCU NONE DETECTED 11/23/2021 0304   LABBARB NONE DETECTED 11/23/2021 0304    Alcohol Level     Component Value Date/Time   ETH <10 03/09/2022 1755   No results found for: "PHENYTOIN", "ZONISAMIDE", "LAMOTRIGINE", "LEVETIRACETA" No results found for: "PHENYTOIN", "PHENOBARB", "VALPROATE", "CBMZ"  Imaging and Diagnostic studies  Results for orders placed during the hospital encounter of 03/09/22  CT HEAD WO CONTRAST (5MM) IMPRESSION: 1. Decreased conspicuity of previously noted 3 mm subdural hematoma along the left frontal convexity, now  trace. No additional acute intracranial process. 2. Increased size of previously noted right temporoparietal scalp hematoma.  LTM: This study is suggestive of cortical dysfunction in left hemisphere likely secondary to underlying structural abnormality.  Additionally there is also cortical dysfunction in right temporal region likely secondary to underlying structural abnormality.  Lastly there is moderate diffuse encephalopathy, nonspecific etiology. No seizures or definite epileptiform discharges were seen throughout the recording.  Impression   Carlos Wise is a 81 year old male with past medical history significant for  alcohol and tobacco use, seizure disorder, chronic hyponatremia, hypertension, prostate cancer s/p radiation, right ACA aneurysm s/p coiling and recent left corona radiata lacunar infarction on 9/16 who presented to the ED for evaluation of seizure-like activity. On the day of presentation, patient's caregiver heard thud and witnessed the patient foaming at the mouth, making abnormal noises for 1 to 2 minutes before resolution.  Following this event the patient has been aphasic. Patient still uses alcohol daily althou care giver has been mixing some water to gradually wean him off. Caregiver endorses that he takes his medications as prescribed.  He was also noted to have a R scalp hematoma along with trace L convexity SAH, likely traumatic from fall. SAH is stable on repeat imaging.  Recommendations  - Discontinue LTM given no seizures. - Keppra '1000mg'$  BID. - seizure precautions - CIWA protocol with phenobarb. - Ativan '2mg'$  for seizure lasting more than 3 mins. - MRI Brain without contrast today.  ______________________________________________________________________   Thank you for the opportunity to take part in the care of this patient. If you have any further questions, please contact the neurology consultation attending.  Signed,  [place] Pager Number [phone]

## 2022-03-10 NOTE — TOC Progression Note (Addendum)
Transition of Care [facility]) - Progression Note    Patient Details  Name: Carlos Wise MRN: [id number] Date of Birth: 09-17-1940  Transition of Care [facility]) CM/SW [place], RN Phone Number:[phone]  03/10/2022, 9:56 AM  Clinical Narrative:     Transition of Care [facility]) Screening Note   Patient Details  Name: Carlos Wise Date of Birth: May 31, 1941   Transition of Care [facility]) CM/SW Contact:    Angelita Ingles, RN Phone Number: 03/10/2022, 9:56 AM    Transition of Care Department [facility]) has reviewed patient and no TOC needs have been identified at this time. We will continue to monitor patient advancement through interdisciplinary progression rounds.   1001 TOC acknowledges substance abuse counseling consult. CAGE aid note to follow after assessment.          Expected Discharge Plan and Services                                                 Social Determinants of Health (SDOH) Interventions    Readmission Risk Interventions     No data to display

## 2022-03-10 NOTE — H&P (Signed)
Please see PCCM consult note from 03/09/22 attested by Dr. Tamala Julian.  Julian Hy, DO 03/10/22 6:24 PM [facility]

## 2022-03-10 NOTE — Progress Notes (Addendum)
NAME:  Carlos Wise, MRN:  [id number], DOB:  14-Nov-1940, LOS: 1 ADMISSION DATE:  03/09/2022, CONSULTATION DATE:  03/09/2022 REFERRING MD:  Dr. Alvino Chapel  CHIEF COMPLAINT:  AMS, Unwitnessed Fall, Recent Stroke   History of Present Illness:  Carlos Wise is a 81 year old male with a  history of prostate cancer status post radiation therapy, essential hypertension, esophageal dysphagia and stricture, right ICA aneurysm status post coiling, seizure disorder, chronic hyponatremia, smoker, chronic daily alcohol user, and recent admission with neurology for left lacunar stroke (discharged on 9/18 from [facility] presented to the emergency department for evaluation status post unwitnessed fall with head strike, not on anticoagulation.  In the ED chemistry panel significant for hyponatremia with a sodium at 125, but otherwise within normal limits.  CT of head without contrast that demonstrated a 3 mm subdural hematoma along the left frontal convexity.  No evidence of midline shift.  No evidence of hydrocephalus, and the ventricular system is unchanged from prior.  There is also soft tissue laceration of the right temporoparietal scalp without evidence of underlying calvarial fracture.  CT scanning of cervical spine demonstrated age-appropriate degeneration, with no acute pathology.  At baseline patient is alert and oriented x 3, and walks with assistance of a walker.  He has residual right-sided weakness and procedure aphasia status post small left lacunar infarct.   Pertinent  Medical History  Chronic tobacco use, 1 pack/day Chronic alcohol use, currently 8 ounces of gin a day Chronic hyponatremia Seizure disorder Hypertension Hyperlipidemia Heart failure with preserved ejection fraction sSeizure disorder Depression Left lacunar infarct Aphasia  Malignant neoplasm of the prostate status post radiation therapy  Significant Hospital Events: Including procedures, antibiotic start and stop dates in  addition to other pertinent events   9/25-seen in ED by CCM team  Objective   Blood pressure (!) 140/86, pulse 77, temperature 98 F (36.7 C), temperature source Oral, resp. rate 16, weight 79.7 kg, SpO2 98 %.        Intake/Output Summary (Last 24 hours) at 03/10/2022 0656 Last data filed at 03/10/2022 0400 Gross per 24 hour  Intake 1205.01 ml  Output 800 ml  Net 405.01 ml    Filed Weights   03/10/22 0500  Weight: 79.7 kg   Examination: General: Resting comfortably in no acute distress CV: Regular rate, rhythm. No murmurs appreciated. Warm extremities. Pulm: Normal work of breathing on room air. Clear to ausculation bilaterally GI: Abdomen soft, non-tender, non-distended. Neuro: Will open eyes for a few seconds to physical stimuli, otherwise not following commands.  Assessment & Plan:  #Acute metabolic encephalopathy #Subdural hematoma #Seizure disorder Neurology consulted on admission. Unclear on baseline mentation, although it appears he was able to walk, talk prior to arrival. Subdural improved on repeat imaging last night. Plan to empirically give Rocephin for previous UTI, although unlikely this is the main culprit. We will continue with Keppra per neurology for now.  - Continue LTM per neurology - Keppra '1000mg'$  twice daily - Rocephin 1g (day 1/3)  #Chronic hyponatremia Chronic Na prior to last hospitalization ~130-133. Since start of last hospitalization Na consistently 125-130. Thought possibly due to SSRI? Na 125 on arrival yesterday, 126. We will give 1/2 bolus challenge today and re-check lab this afternoon. - 1/2 bolus NaCl - Repeat BMP this afternoon  #Chronic alcohol use disorder Possibly could be contributing to mental status? Started on phenobarbital taper yesterday.  - Phenobarbital taper - N82, folic acid  #Nutrition Cortrak placed today, start tube feeds  Best Practice (right click and "Reselect all SmartList Selections" daily)   Diet/type:  tubefeeds DVT prophylaxis: SCD GI prophylaxis: Home Protonix 40 mg daily Lines: N/A Foley:  N/A Code Status:  full code Last date of multidisciplinary goals of care discussion [9/25 at bedside with healthcare power of attorney, Carlos Wise, who is patient's caretaker.]  Labs   CBC: Recent Labs  Lab 03/09/22 1310 03/10/22 0451  WBC 7.9 8.8  NEUTROABS 5.6 6.7  HGB 13.2 12.9*  HCT 36.2* 35.2*  MCV 91.0 89.6  PLT 302 281     Basic Metabolic Panel: Recent Labs  Lab 03/09/22 1310 03/10/22 0451  NA 125* 126*  K 4.7 3.9  CL 90* 90*  CO2 25 25  GLUCOSE 104* 91  BUN 9 6*  CREATININE 0.95 0.82  CALCIUM 9.4 9.2  MG  --  2.1    GFR: Estimated Creatinine Clearance: 72.7 mL/min (by C-G formula based on SCr of 0.82 mg/dL). Recent Labs  Lab 03/09/22 1310 03/10/22 0451  WBC 7.9 8.8    Liver Function Tests: Recent Labs  Lab 03/09/22 1310  AST 32  ALT 13  ALKPHOS 63  BILITOT 0.8  PROT 6.8  ALBUMIN 4.1    No results for input(s): "LIPASE", "AMYLASE" in the last 168 hours. Recent Labs  Lab 03/09/22 1310  AMMONIA 31    ABG    Component Value Date/Time   HCO3 22.4 11/22/2021 1359   TCO2 26 02/28/2022 1701   O2SAT 100 11/22/2021 1359    Coagulation Profile: No results for input(s): "INR", "PROTIME" in the last 168 hours.  Cardiac Enzymes: No results for input(s): "CKTOTAL", "CKMB", "CKMBINDEX", "TROPONINI" in the last 168 hours.  HbA1C: Hgb A1c MFr Bld  Date/Time Value Ref Range Status  03/02/2022 05:07 AM 5.1 4.8 - 5.6 % Final    Comment:    (NOTE) Pre diabetes:          5.7%-6.4%  Diabetes:              >6.4%  Glycemic control for   <7.0% adults with diabetes   02/04/2020 02:19 PM 5.6 4.8 - 5.6 % Final    Comment:    (NOTE) Pre diabetes:          5.7%-6.4%  Diabetes:              >6.4%  Glycemic control for   <7.0% adults with diabetes    CBG: No results for input(s): "GLUCAP" in the last 168 hours.  Past Medical History:  He,  has a  past medical history of Herpes zoster, Hyperlipemia, Hypertension, Intestinal polyp, Prostate cancer ([place]), S/P radiation therapy (12/12/2014 through 02/06/2015                                                   ), and Seizure disorder ([place]).   Surgical History:   Past Surgical History:  Procedure Laterality Date   Carotid Artery Catherization     PROSTATE BIOPSY  06/27/2014    Social History:   Patient has a significant smoking history, and does not want to stop smoking or EtOH consumption per caretaker.  Patient currently smoking 1 pack/day, and drinking 8 ounces of gin daily.   Family History:  His family history includes Colon cancer in his mother; Heart attack in his father; Prostate cancer in his father.  Allergies Allergies  Allergen Reactions   Penicillins Other (See Comments)    Patient states no allergy, but has preference that he does not want this medication    Home Medications  Prior to Admission medications   Medication Sig Start Date End Date Taking? Authorizing Provider  aspirin EC 81 MG tablet Take 1 tablet (81 mg total) by mouth daily. Swallow whole. 03/03/22   Barb Merino, MD  atorvastatin (LIPITOR) 20 MG tablet Take 1 tablet (20 mg total) by mouth daily. 03/02/22 03/02/23  Barb Merino, MD  bismuth subsalicylate (PEPTO BISMOL) 262 MG/15ML suspension Take 30 mLs by mouth every 6 (six) hours as needed for indigestion.    [provider]  cholecalciferol (VITAMIN D3) 25 MCG (1000 UNIT) tablet Take 1,000 Units by mouth daily.    [provider]  clobetasol (TEMOVATE) 0.05 % external solution Apply 1 application  topically 2 (two) times daily as needed for itching. 10/13/21   [provider]  Cyanocobalamin (VITAMIN B-12 ER PO) Take 1,000 mcg by mouth daily.    [provider]  escitalopram (LEXAPRO) 10 MG tablet Take 1 tablet (10 mg total) by mouth daily. 03/02/22 08/29/22  Barb Merino, MD  fluticasone (FLONASE) 50 MCG/ACT nasal spray  Place 1 spray into both nostrils daily. 12/10/19   [provider]  folic acid (FOLVITE) 1 MG tablet Take 1 mg by mouth daily.    [provider]  gabapentin (NEURONTIN) 300 MG capsule Take 300 mg by mouth at bedtime.    [provider]  ketotifen (ALLERGY EYE DROPS) 0.025 % ophthalmic solution Place 1 drop into both eyes daily as needed (red eyes).    [provider]  levETIRAcetam (KEPPRA) 500 MG tablet Take 1 tablet (500 mg total) by mouth 2 (two) times daily. 11/28/21 02/28/22  Leslee Home, DO  loratadine (CLARITIN) 10 MG tablet Take 10 mg by mouth daily.    [provider]  Naproxen Sod-diphenhydrAMINE (ALEVE PM) 220-25 MG TABS Take 2 tablets by mouth at bedtime.    [provider]  nicotine (NICODERM CQ - DOSED IN MG/24 HOURS) 14 mg/24hr patch Place 1 patch (14 mg total) onto the skin daily. Patient not taking: Reported on 02/28/2022 11/29/21   Leslee Home, DO  pantoprazole (PROTONIX) 40 MG tablet Take 40 mg by mouth daily before breakfast.     [provider]  polyethylene glycol (MIRALAX / GLYCOLAX) 17 g packet Take 17 g by mouth daily as needed for severe constipation. 02/09/20   Hosie Poisson, MD  Pseudoeph-Doxylamine-DM-APAP (NYQUIL PO) Take 5 mLs by mouth at bedtime.    [provider]  sodium chloride (OCEAN) 0.65 % SOLN nasal spray Place 1 spray into both nostrils daily as needed for congestion.    [provider]  sodium chloride 1 g tablet Take 1 tablet (1 g total) by mouth 3 (three) times daily with meals. 03/02/22 04/01/22  Barb Merino, MD  thiamine (VITAMIN B-1) 100 MG tablet Take 250 mg by mouth daily.    [provider]  triamcinolone cream (KENALOG) 0.1 % Apply 1 application  topically 2 (two) times daily as needed for skin breakdown. 10/13/21   [provider]     Critical care time: 35 minutes    Sanjuan Dame, MD Internal Medicine PGY-3 Pager: [phone]

## 2022-03-10 NOTE — Procedures (Signed)
Cortrak  Person Inserting Tube:  Maylon Peppers C, RD Tube Type:  Cortrak - 43 inches Tube Size:  10 Tube Location:  Left nare Secured by: Bridle Technique Used to Measure Tube Placement:  Marking at nare/corner of mouth Cortrak Secured At:  70 cm   Cortrak Tube Team Note:  Consult received to place a Cortrak feeding tube.   X-ray is required, abdominal x-ray has been ordered by the Cortrak team. Please confirm tube placement before using the Cortrak tube.   If the tube becomes dislodged please keep the tube and contact the Cortrak team at [email or web address] ([email or web address]) for replacement.  If after hours and replacement cannot be delayed, place a NG tube and confirm placement with an abdominal x-ray.    Lockie Pares., RD, LDN, CNSC See AMiON for contact information

## 2022-03-11 ENCOUNTER — Inpatient Hospital Stay (HOSPITAL_COMMUNITY): Payer: Medicare HMO

## 2022-03-11 DIAGNOSIS — I6329 Cerebral infarction due to unspecified occlusion or stenosis of other precerebral arteries: Secondary | ICD-10-CM

## 2022-03-11 DIAGNOSIS — R4701 Aphasia: Secondary | ICD-10-CM

## 2022-03-11 DIAGNOSIS — G9341 Metabolic encephalopathy: Secondary | ICD-10-CM | POA: Diagnosis not present

## 2022-03-11 DIAGNOSIS — E871 Hypo-osmolality and hyponatremia: Secondary | ICD-10-CM

## 2022-03-11 DIAGNOSIS — Z8673 Personal history of transient ischemic attack (TIA), and cerebral infarction without residual deficits: Secondary | ICD-10-CM

## 2022-03-11 DIAGNOSIS — R1310 Dysphagia, unspecified: Secondary | ICD-10-CM | POA: Diagnosis not present

## 2022-03-11 LAB — GLUCOSE, CAPILLARY
Glucose-Capillary: 123 mg/dL — ABNORMAL HIGH (ref 70–99)
Glucose-Capillary: 152 mg/dL — ABNORMAL HIGH (ref 70–99)
Glucose-Capillary: 147 mg/dL — ABNORMAL HIGH (ref 70–99)
Glucose-Capillary: 139 mg/dL — ABNORMAL HIGH (ref 70–99)
Glucose-Capillary: 143 mg/dL — ABNORMAL HIGH (ref 70–99)

## 2022-03-11 LAB — BASIC METABOLIC PANEL
Anion gap: 9 (ref 5–15)
BUN: 13 mg/dL (ref 8–23)
CO2: 21 mmol/L — ABNORMAL LOW (ref 22–32)
Calcium: 8.7 mg/dL — ABNORMAL LOW (ref 8.9–10.3)
Chloride: 95 mmol/L — ABNORMAL LOW (ref 98–111)
Creatinine, Ser: 0.76 mg/dL (ref 0.61–1.24)
GFR, Estimated: >60 mL/min (ref >60)
Glucose, Bld: 153 mg/dL — ABNORMAL HIGH (ref 70–99)
Potassium: 3.8 mmol/L (ref 3.5–5.1)
Sodium: 125 mmol/L — ABNORMAL LOW (ref 135–145)

## 2022-03-11 LAB — MAGNESIUM: Magnesium: 1.8 mg/dL (ref 1.7–2.4)

## 2022-03-11 LAB — PHOSPHORUS: Phosphorus: 2.6 mg/dL (ref 2.5–4.6)

## 2022-03-11 MED ORDER — ORAL CARE MOUTH RINSE
15.0000 mL | OROMUCOSAL | Status: DC
  Administered 2022-03-11 – 2022-03-25 (×48): 15 mL via OROMUCOSAL

## 2022-03-11 MED ORDER — ATORVASTATIN CALCIUM 80 MG PO TABS
80.0000 mg | ORAL_TABLET | Freq: Every day | ORAL | Status: DC
  Administered 2022-03-11 – 2022-03-22 (×12): 80 mg
  Filled 2022-03-11 (×2): qty 1
  Filled 2022-03-11: qty 2
  Filled 2022-03-11 (×9): qty 1

## 2022-03-11 MED ORDER — LEVETIRACETAM 100 MG/ML PO SOLN
1000.0000 mg | Freq: Two times a day (BID) | ORAL | Status: DC
  Administered 2022-03-11 – 2022-03-22 (×23): 1000 mg
  Filled 2022-03-11 (×25): qty 10

## 2022-03-11 MED ORDER — ORAL CARE MOUTH RINSE
15.0000 mL | OROMUCOSAL | Status: DC | PRN
  Administered 2022-03-11: 15 mL via OROMUCOSAL

## 2022-03-11 NOTE — IPAL (Signed)
  Interdisciplinary Goals of Care Family Meeting   Date carried out: 03/11/2022  Location of the meeting: Bedside  Member's involved: Physician, Bedside Registered Nurse, Family Member or next of kin, and Other: resident physician Dr. Mike Gip Power of Attorney or acting medical decision maker: Carlos Wise, [place]    Discussion: We discussed goals of care for Carlos Wise .  Carlos Wise facilitates [name]'s care at home and has known [name] and his late wife for many years. She ensures he has full time caregivers. She wants for major goals of care decisions to have involvement of his close family. She understands that he had a stroke that likely explains much of his decreased alertness currently. Although he is intermittently arousable and he can move, my concern is that he remains very high risk for aspiration of oral secretions. If we get to the point that he develops severe aspiration pneumonia or hypoxia from poor secretion management, progressing to mechanical ventilation is unlikely to change his prognosis. This would be associated with a poor outcome regardless. She understands this but remains hopeful we can rehabilitate him-- he has goals to go finishing, bike riding, and going to the [employer]. She is very focused on maximizing his quality of life, but understands this could lead to a severe decline for him. At this time he will remain full code. We have asked her to keep the rest of the family updated and encouraged them to discuss code status and plans for if he develops respiratory compromise due to inability to protect airway.  Code status: Full Code  Disposition: Continue current acute care  Time spent for the meeting: 15 min.    Julian Hy, DO  03/11/2022, 12:09 PM

## 2022-03-11 NOTE — Progress Notes (Signed)
SLP Cancellation Note  Patient Details Name: Carlos Wise MRN: [id number] DOB: 1940/08/13   Cancelled treatment:       Reason Eval/Treat Not Completed: Patient's level of consciousness. RN advised to hold off on attempting swallow evaluation until tomorrow as patient has been very out of it today. SLP will f/u tomorrow.   Sonia Baller, MA, CCC-SLP Speech Therapy

## 2022-03-11 NOTE — Progress Notes (Signed)
Patient received from [name], no family with patient and patient does not follow commands. Made comfortable in bed and call placed within reach. Will continue to monitor.Carlos Wise

## 2022-03-11 NOTE — Progress Notes (Signed)
[facility] Physician-Brief Progress Note Patient Name: Carlos Wise DOB: 1941/03/19 MRN: [id number]   Date of Service  03/11/2022  HPI/Events of Note  81 y/o M, ETOH/smoking abuse, seizure disorder, chronic hyponatremia, HTN, prostate cancer s/p radiation, right ACA aneurysm s/p coiling and recent left corona radiata lacunar infarction on 9/16 presented to the ED for evaluation of seizure-like activity.    Following this event the patient has been aphasic.  Had R scalp hematoma along with trace L convexity SAH, likely traumatic SAH stable on repeat imaging.  MRI results:  1. Small focus of restricted diffusion in the left pons, new compared to 03/01/2022, likely an acute or early subacute infarct. 2. Other restricted diffusion in the left corona radiata is decreased vs 03/01/2022   eICU Interventions  - neurology following  - Keppra '1000mg'$  BID. - seizure precautions - CIWA protocol with phenobarb - Ativan '2mg'$  for seizure lasting more than 3 mins. - possible subacute new CVA in pons, monitor neuro status, no acute intervention now -> supportive care         Laticia Vannostrand N Johnae Friley 03/11/2022, 3:11 AM

## 2022-03-11 NOTE — Progress Notes (Addendum)
NEUROLOGY CONSULTATION PROGRESS NOTE   Date of service: March 11, 2022 Patient Name: Carlos Wise MRN:  [id number] DOB:  [birth year]   Interval Hx   He is lethargic, recently received phenobarbital. No family at bedside. Opens eyes to noxious stimuli, not following commands   Vitals   Vitals:   03/11/22 0730 03/11/22 0736 03/11/22 0800 03/11/22 0900  BP: 126/77  122/69 124/68  Pulse: 76  80 79  Resp: 19  19 (!) 22  Temp:  98 F (36.7 C)    TempSrc:  Axillary    SpO2: 94%  93% 95%  Weight:         Body mass index is 27.17 kg/m.  Physical Exam   General: Laying comfortably in bed; in no acute distress. HENT: Normal oropharynx and mucosa. Normal external appearance of ears and nose.  Neck: Supple, no pain or tenderness  CV: No JVD. No peripheral edema.  Pulmonary: Symmetric Chest rise. Normal respiratory effort.  Abdomen: Soft to touch, non-tender.  Ext: No cyanosis, edema, or deformity  Skin: No rash. Normal palpation of skin.   Musculoskeletal: Normal digits and nails by inspection. No clubbing.   Neurologic Examination  Mental status/Cognition: opens eyes to noxious stimuli. does not follow commands or answer any orientation questions. Speech/language: mumbles, unintelligible, does not follow commands. Cranial nerves:   CN II Pupils equal and reactive to light,    CN III,IV,VI  no gaze preference or deviation, no nystagmus   CN V normal sensation in V1, V2, and V3 segments bilaterally   CN VII no asymmetry, no nasolabial fold flattening   CN VIII Turns head towards speech   CN IX & X Unable to assess but awake and protecting airway   CN XI Head midline.   CN XII midline tongue but does not protrude on command   Motor:  Muscle bulk: , tone normal  Both hands in mitts and restraints    Sensation: Localizes to pinch in all extremities  Coordination/Complex Motor:  - unable to assess.  Labs   Basic Metabolic Panel:  Lab Results  Component Value Date    NA 125 (L) 03/11/2022   K 3.8 03/11/2022   CO2 21 (L) 03/11/2022   GLUCOSE 153 (H) 03/11/2022   BUN 13 03/11/2022   CREATININE 0.76 03/11/2022   CALCIUM 8.7 (L) 03/11/2022   GFRNONAA >60 03/11/2022   GFRAA >60 02/08/2020   HbA1c:  Lab Results  Component Value Date   HGBA1C 5.1 03/02/2022   LDL:  Lab Results  Component Value Date   LDLCALC 161 (H) 03/02/2022   Urine Drug Screen:     Component Value Date/Time   LABOPIA NONE DETECTED 11/23/2021 0304   COCAINSCRNUR NONE DETECTED 11/23/2021 0304   LABBENZ NONE DETECTED 11/23/2021 0304   AMPHETMU NONE DETECTED 11/23/2021 0304   THCU NONE DETECTED 11/23/2021 0304   LABBARB NONE DETECTED 11/23/2021 0304    Alcohol Level     Component Value Date/Time   ETH <10 03/09/2022 1755   No results found for: "PHENYTOIN", "ZONISAMIDE", "LAMOTRIGINE", "LEVETIRACETA" No results found for: "PHENYTOIN", "PHENOBARB", "VALPROATE", "CBMZ"  Imaging and Diagnostic studies  Results for orders placed during the hospital encounter of 03/09/22  CT HEAD WO CONTRAST (5MM) IMPRESSION: 1. Decreased conspicuity of previously noted 3 mm subdural hematoma along the left frontal convexity, now trace. No additional acute intracranial process. 2. Increased size of previously noted right temporoparietal scalp hematoma.  LTM: This study is suggestive of cortical dysfunction in left  hemisphere likely secondary to underlying structural abnormality.  Additionally there is also cortical dysfunction in right temporal region likely secondary to underlying structural abnormality.  Lastly there is moderate diffuse encephalopathy, nonspecific etiology. No seizures or definite epileptiform discharges were seen throughout the recording.  MRI brain  1. Small focus of restricted diffusion in the left pons, new compared to 03/01/2022, likely an acute or early subacute infarct. 2. Other restricted diffusion in the left corona radiata is decreased in conspicuity  compared to 03/01/2022 and consistent with evolution of the previously noted infarcts.   Echo :  1. Left ventricular ejection fraction, by estimation, is 60 to 65%. The left ventricle has normal function. The left ventricle has no regional wall motion abnormalities. Left ventricular diastolic parameters are consistent with Grade I diastolic dysfunction (impaired relaxation). Elevated left ventricular end-diastolic pressure.   2. Right ventricular systolic function is normal. The right ventricular size is normal. Tricuspid regurgitation signal is inadequate for assessing PA pressure.   3. The mitral valve is normal in structure. No evidence of mitral valve regurgitation. No evidence of mitral stenosis.   4. The aortic valve is tricuspid. There is moderate calcification of the aortic valve. Aortic valve regurgitation is trivial. Aortic valve sclerosis/calcification is present, without any evidence of aortic stenosis. Aortic valve Vmax measures 1.09 m/s.   5. Aortic dilatation noted. There is mild dilatation of the ascending aorta, measuring 43 mm.   6. The inferior vena cava is normal in size with greater than 50% respiratory variability, suggesting right atrial pressure of 3 mmHg.   Impression   Carlos Wise is a 81 year old male with past medical history significant for alcohol and tobacco use, seizure disorder, chronic hyponatremia, hypertension, prostate cancer s/p radiation, right ACA aneurysm s/p coiling and recent left corona radiata lacunar infarction on 9/16 who presented to the ED for evaluation of seizure-like activity. On the day of presentation, patient's caregiver heard thud and witnessed the patient foaming at the mouth, making abnormal noises for 1 to 2 minutes before resolution.  Following this event the patient has been aphasic. Patient still uses alcohol daily althou care giver has been mixing some water to gradually wean him off. Caregiver endorses that he takes his medications as  prescribed.  He was also noted to have a R scalp hematoma along with trace L convexity SAH, likely traumatic from fall. SAH is stable on repeat imaging.  MRI brain completed and shows acute stroke in L pons and in the L corona radiata.  Recommendations  - Keppra '1000mg'$  BID. - seizure precautions - CIWA protocol with phenobarb. - Ativan '2mg'$  for seizure lasting more than 3 mins. - Stroke workup to include: - MRA head and neck  - Increase atorvastatin to 80 mg  - Frequent neuro checks - Prophylactic therapy- no antiplatelets given SAH. - Risk factor modification - Telemetry monitoring - PT consult, OT consult, Speech consult - Stroke team to follow   Beulah Gandy DNP, ACNPC-AG  ______________________________________________________________________  NEUROHOSPITALIST ADDENDUM Performed a face to face diagnostic evaluation.   I have reviewed the contents of history and physical exam as documented by PA/ARNP/Resident and agree with above documentation.  I have discussed and formulated the above plan as documented. Edits to the note have been made as needed.  Impression/Key exam findings/Plan: encephalopathic today, mumbles with persistent aphasia. MRI overnight with a L pontine stroke and a L corona radiata stroke. He is on phenobarb for CIWA.  I suspect that his encephalopathy and aphasia  is likely multifactorial from post ictal aphasia which he has had with prior seizures, possible TBI and L sided SAH, L corona radiata infarct can cause speech apraxia. Phenobarb can cause sedation and encephalopathy and he is also likely delirious on top of all this from being in the hospital.  Given his age, comorbidities and EtOH use, this is going to take a long time to recover from if he does recover.  Recs:  Stroke workup as above Keppra '1000mg'$  BID. CIWA, titrate down phenobarb as able.  Donnetta Simpers, MD [facility] [phone]   If 7pm to 7am, please call on call as  listed on AMION.

## 2022-03-11 NOTE — Progress Notes (Addendum)
NAME:  Carlos Wise, MRN:  [id number], DOB:  [birth year], LOS: 2 ADMISSION DATE:  03/09/2022, CONSULTATION DATE:  03/09/2022 REFERRING MD:  Dr. Alvino Chapel  CHIEF COMPLAINT:  AMS, Unwitnessed Fall, Recent Stroke   History of Present Illness:  Carlos Wise is a 81 year old male with a  history of prostate cancer status post radiation therapy, essential hypertension, esophageal dysphagia and stricture, right ICA aneurysm status post coiling, seizure disorder, chronic hyponatremia, smoker, chronic daily alcohol user, and recent admission with neurology for left lacunar stroke (discharged on 9/18 [facility] presented to the emergency department for evaluation status post unwitnessed fall with head strike, not on anticoagulation.  In the ED chemistry panel significant for hyponatremia with a sodium at 125, but otherwise within normal limits.  CT of head without contrast that demonstrated a 3 mm subdural hematoma along the left frontal convexity.  No evidence of midline shift.  No evidence of hydrocephalus, and the ventricular system is unchanged from prior.  There is also soft tissue laceration of the right temporoparietal scalp without evidence of underlying calvarial fracture.  CT scanning of cervical spine demonstrated age-appropriate degeneration, with no acute pathology.  At baseline patient is alert and oriented x 3, and walks with assistance of a walker.  He has residual right-sided weakness and procedure aphasia status post small left lacunar infarct.   Pertinent  Medical History  Chronic tobacco use, 1 pack/day Chronic alcohol use, currently 8 ounces of gin a day Chronic hyponatremia Seizure disorder Hypertension Hyperlipidemia Heart failure with preserved ejection fraction sSeizure disorder Depression Left lacunar infarct Aphasia  Malignant neoplasm of the prostate status post radiation therapy  Significant Hospital Events: Including procedures, antibiotic start and stop dates in  addition to other pertinent events   9/25-seen in ED by CCM team, admitted to ICU 9/26 - MRI w/ new acute/subacute CVA  Interim History / Subjective:  No acute events overnight. Will attempt off of wrist restraints this morning.   Objective   Blood pressure 122/69, pulse 80, temperature 98 F (36.7 C), temperature source Axillary, resp. rate 19, weight 78.7 kg, SpO2 94 %.        Intake/Output Summary (Last 24 hours) at 03/11/2022 0837 Last data filed at 03/11/2022 0735 Gross per 24 hour  Intake 822.9 ml  Output 600 ml  Net 222.9 ml    Filed Weights   03/10/22 0500 03/11/22 0500  Weight: 79.7 kg 78.7 kg   Examination: General: Resting comfortably in no acute distress CV: Regular rate, rhythm. No murmurs appreciated. Warm extremities. Pulm: Normal work of breathing on room air. Clear to ausculation bilaterally GI: Abdomen soft, non-tender, non-distended. Normoactive bowel sounds.  Neuro: Pupils equal, round, reactive. Responded to pain in all four extremities. Does not follow commands.   Assessment & Plan:  #Acute post-traumatic encephalopathy   #Subdural hematoma #Seizure disorder #Acute L pons CVA Yesterday discussed with caretaker, who reports typically he can walk around with walker, eat, and have discussions with people. MRI yesterday revealed likely acute CVA L pons. Most likely encephalopathy 2/2 mixed picture subdural hematoma, CVA. Recently had stroke work-up, will follow-up with neurology for further recommendations. Also continue with Keppra per neurology, as seizure disorder cannot be totally ruled out.  - Continue aspirin '81mg'$  - add Plavix? - Discuss with neurology for any further imaging needs - Keppra '1000mg'$  twice daily  #Chronic hyponatremia Chronic Na prior to last hospitalization ~130-133. Since start of last hospitalization Na consistently 125-130. 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 After 0.5L bolus yesterday no changes. Most likely more chronic  in nature, unlikely this is contributing to his presentation.  - Daily BMP  #Chronic alcohol use disorder Started on phenobarbital taper yesterday, stable. Will need continued counseling on cessation once he is awake.  - Phenobarbital taper - W10, folic acid  #Urinary tract infection Based on cultures from 9/16. Continue with Rocephin. - Ceftriaxone 1g (day 2/3)  #Nutrition Cortrak placed yesterday, will continue with tube feeds.  Best Practice (right click and "Reselect all SmartList Selections" daily)   Diet/type: tubefeeds DVT prophylaxis: SCD GI prophylaxis: Home Protonix 40 mg daily Lines: N/A Foley:  N/A Code Status:  full code Last date of multidisciplinary goals of care discussion [9/26 at bedside with healthcare power of attorney, [name], who is patient's caretaker.]  Labs   CBC: Recent Labs  Lab 03/09/22 1310 03/10/22 0451  WBC 7.9 8.8  NEUTROABS 5.6 6.7  HGB 13.2 12.9*  HCT 36.2* 35.2*  MCV 91.0 89.6  PLT 302 281     Basic Metabolic Panel: Recent Labs  Lab 03/09/22 1310 03/10/22 0451 03/10/22 1548  NA 125* 126* 126*  K 4.7 3.9 4.0  CL 90* 90* 93*  CO2 '25 25 23  '$ GLUCOSE 104* 91 117*  BUN 9 6* 9  CREATININE 0.95 0.82 0.65  CALCIUM 9.4 9.2 8.9  MG  --  2.1  --     GFR: Estimated Creatinine Clearance: 68.9 mL/min (by C-G formula based on SCr of 0.65 mg/dL). Recent Labs  Lab 03/09/22 1310 03/10/22 0451  WBC 7.9 8.8    Liver Function Tests: Recent Labs  Lab 03/09/22 1310  AST 32  ALT 13  ALKPHOS 63  BILITOT 0.8  PROT 6.8  ALBUMIN 4.1    No results for input(s): "LIPASE", "AMYLASE" in the last 168 hours. Recent Labs  Lab 03/09/22 1310  AMMONIA 31    ABG    Component Value Date/Time   HCO3 22.4 11/22/2021 1359   TCO2 26 02/28/2022 1701   O2SAT 100 11/22/2021 1359    Coagulation Profile: No results for input(s): "INR", "PROTIME" in the last 168 hours.  Cardiac Enzymes: No results for input(s): "CKTOTAL", "CKMB",  "CKMBINDEX", "TROPONINI" in the last 168 hours.  HbA1C: Hgb A1c MFr Bld  Date/Time Value Ref Range Status  03/02/2022 05:07 AM 5.1 4.8 - 5.6 % Final    Comment:    (NOTE) Pre diabetes:          5.7%-6.4%  Diabetes:              >6.4%  Glycemic control for   <7.0% adults with diabetes   02/04/2020 02:19 PM 5.6 4.8 - 5.6 % Final    Comment:    (NOTE) Pre diabetes:          5.7%-6.4%  Diabetes:              >6.4%  Glycemic control for   <7.0% adults with diabetes    CBG: Recent Labs  Lab 03/10/22 1549 03/10/22 2003 03/10/22 2331 03/11/22 0345 03/11/22 0735  GLUCAP 119* 119* 114* 123* 152*    Past Medical History:  He,  has a past medical history of Herpes zoster, Hyperlipemia, Hypertension, Intestinal polyp, Prostate cancer ([place]), S/P radiation therapy (12/12/2014 through 02/06/2015                                                   ),  and Seizure disorder ([place]).   Surgical History:   Past Surgical History:  Procedure Laterality Date   Carotid Artery Catherization     PROSTATE BIOPSY  06/27/2014    Social History:   Patient has a significant smoking history, and does not want to stop smoking or EtOH consumption per caretaker.  Patient currently smoking 1 pack/day, and drinking 8 ounces of gin daily.   Family History:  His family history includes Colon cancer in his mother; Heart attack in his father; Prostate cancer in his father.   Allergies Allergies  Allergen Reactions   Penicillins Other (See Comments)    Patient states no allergy, but has preference that he does not want this medication    Home Medications  Prior to Admission medications   Medication Sig Start Date End Date Taking? Authorizing Provider  aspirin EC 81 MG tablet Take 1 tablet (81 mg total) by mouth daily. Swallow whole. 03/03/22   Barb Merino, MD  atorvastatin (LIPITOR) 20 MG tablet Take 1 tablet (20 mg total) by mouth daily. 03/02/22 03/02/23  Barb Merino, MD  bismuth subsalicylate  (PEPTO BISMOL) 262 MG/15ML suspension Take 30 mLs by mouth every 6 (six) hours as needed for indigestion.    [provider]  cholecalciferol (VITAMIN D3) 25 MCG (1000 UNIT) tablet Take 1,000 Units by mouth daily.    [provider]  clobetasol (TEMOVATE) 0.05 % external solution Apply 1 application  topically 2 (two) times daily as needed for itching. 10/13/21   [provider]  Cyanocobalamin (VITAMIN B-12 ER PO) Take 1,000 mcg by mouth daily.    [provider]  escitalopram (LEXAPRO) 10 MG tablet Take 1 tablet (10 mg total) by mouth daily. 03/02/22 08/29/22  Barb Merino, MD  fluticasone (FLONASE) 50 MCG/ACT nasal spray Place 1 spray into both nostrils daily. 12/10/19   [provider]  folic acid (FOLVITE) 1 MG tablet Take 1 mg by mouth daily.    [provider]  gabapentin (NEURONTIN) 300 MG capsule Take 300 mg by mouth at bedtime.    [provider]  ketotifen (ALLERGY EYE DROPS) 0.025 % ophthalmic solution Place 1 drop into both eyes daily as needed (red eyes).    [provider]  levETIRAcetam (KEPPRA) 500 MG tablet Take 1 tablet (500 mg total) by mouth 2 (two) times daily. 11/28/21 02/28/22  Leslee Home, DO  loratadine (CLARITIN) 10 MG tablet Take 10 mg by mouth daily.    [provider]  Naproxen Sod-diphenhydrAMINE (ALEVE PM) 220-25 MG TABS Take 2 tablets by mouth at bedtime.    [provider]  nicotine (NICODERM CQ - DOSED IN MG/24 HOURS) 14 mg/24hr patch Place 1 patch (14 mg total) onto the skin daily. Patient not taking: Reported on 02/28/2022 11/29/21   Leslee Home, DO  pantoprazole (PROTONIX) 40 MG tablet Take 40 mg by mouth daily before breakfast.     [provider]  polyethylene glycol (MIRALAX / GLYCOLAX) 17 g packet Take 17 g by mouth daily as needed for severe constipation. 02/09/20   Hosie Poisson, MD  Pseudoeph-Doxylamine-DM-APAP (NYQUIL PO) Take 5 mLs by mouth at bedtime.     [provider]  sodium chloride (OCEAN) 0.65 % SOLN nasal spray Place 1 spray into both nostrils daily as needed for congestion.    [provider]  sodium chloride 1 g tablet Take 1 tablet (1 g total) by mouth 3 (three) times daily with meals. 03/02/22 04/01/22  Barb Merino,  MD  thiamine (VITAMIN B-1) 100 MG tablet Take 250 mg by mouth daily.    [provider]  triamcinolone cream (KENALOG) 0.1 % Apply 1 application  topically 2 (two) times daily as needed for skin breakdown. 10/13/21   [provider]     Critical care time: 35 minutes    Sanjuan Dame, MD Internal Medicine PGY-3 Pager: [phone]

## 2022-03-12 DIAGNOSIS — G9341 Metabolic encephalopathy: Secondary | ICD-10-CM | POA: Diagnosis not present

## 2022-03-12 LAB — BASIC METABOLIC PANEL
Anion gap: 11 (ref 5–15)
BUN: 16 mg/dL (ref 8–23)
CO2: 25 mmol/L (ref 22–32)
Calcium: 9.2 mg/dL (ref 8.9–10.3)
Chloride: 92 mmol/L — ABNORMAL LOW (ref 98–111)
Creatinine, Ser: 0.69 mg/dL (ref 0.61–1.24)
GFR, Estimated: >60 mL/min (ref >60)
Glucose, Bld: 125 mg/dL — ABNORMAL HIGH (ref 70–99)
Potassium: 3.9 mmol/L (ref 3.5–5.1)
Sodium: 128 mmol/L — ABNORMAL LOW (ref 135–145)

## 2022-03-12 LAB — GLUCOSE, CAPILLARY
Glucose-Capillary: 154 mg/dL — ABNORMAL HIGH (ref 70–99)
Glucose-Capillary: 103 mg/dL — ABNORMAL HIGH (ref 70–99)
Glucose-Capillary: 139 mg/dL — ABNORMAL HIGH (ref 70–99)
Glucose-Capillary: 129 mg/dL — ABNORMAL HIGH (ref 70–99)
Glucose-Capillary: 134 mg/dL — ABNORMAL HIGH (ref 70–99)

## 2022-03-12 LAB — PHOSPHORUS: Phosphorus: 2.4 mg/dL — ABNORMAL LOW (ref 2.5–4.6)

## 2022-03-12 LAB — MAGNESIUM: Magnesium: 2 mg/dL (ref 1.7–2.4)

## 2022-03-12 NOTE — Care Management Important Message (Signed)
Important Message  Patient Details  Name: Carlos Wise MRN: [id number] Date of Birth: 1940-07-29   Medicare Important Message Given:  Yes     Hannah Beat 03/12/2022, 12:40 PM

## 2022-03-12 NOTE — Evaluation (Signed)
Clinical/Bedside Swallow Evaluation Patient Details  Name: Carlos Wise MRN: [id number] Date of Birth: [birth year]  Today's Date: 03/12/2022 Time: SLP Start Time (ACUTE ONLY): 77 SLP Stop Time (ACUTE ONLY): 1055 SLP Time Calculation (min) (ACUTE ONLY): 14 min  Past Medical History:  Past Medical History:  Diagnosis Date   Herpes zoster    Hyperlipemia    Hypertension    Intestinal polyp    hyperplastic   Prostate cancer ([place])    S/P radiation therapy 12/12/2014 through 02/06/2015                                                      Prostate 7800 cGy in 40 sessions, seminal vesicles 5600 cGy in 40 sessions                           Seizure disorder [facility])    Past Surgical History:  Past Surgical History:  Procedure Laterality Date   Carotid Artery Catherization     PROSTATE BIOPSY  06/27/2014   HPI:  Pt is an 81 yo male presenting after unwitnessed fall in the shower. CT Head showed 18m SDH along the L frontal convexity. MRI revealed likely acute CVA L pons and L corona radiata. MBS 01/08/22 with some oral holding and c/o globus sensation but with otherwise functional oropharyngeal swallow. speech/languag eveal 02/05/20 with cognitive impairment and severe aphasia with expressive/receptive deficits (wernicke's-like appearance) as well as possible motor planning impairment. PMH includes: esophageal dysphagia and stricture, [facility] R ICA aneurysm s/p coiling, seizure disorder, smoker, chronic daily alcohol user, prostate ca s/p XRT, HTN, chronic hyponatremia    Assessment / Plan / Recommendation  Clinical Impression  Pt has a h/o dysphagia with concern for acute on chronic changes. Oral holding is noted, as has been observed by SLP as recently as July 2023 on MBS, but with normal pharyngeal function. Pt presents today with concern for altered mentation (although unclear exactly what baseline is) impacting command following and sustained attention. Signs of a  suspected pharyngeal dysphagia possibly with aspiration include immediate coughing and wet vocal quality with ice chips and small amounts of water. Pt without audible wetness at baseline but with thick secretions throughout his oral cavity that were cleared by SLP. He is not yet ready for PO diet, but will continue to follow. Would encourage focusing on oral hygiene as much as possible for now. SLP Visit Diagnosis: Dysphagia, unspecified (R13.10)    Aspiration Risk  Moderate aspiration risk;Risk for inadequate nutrition/hydration    Diet Recommendation NPO;Alternative means - temporary   Medication Administration: Via alternative means    Other  Recommendations Oral Care Recommendations: Oral care QID Other Recommendations: Have oral suction available    Recommendations for follow up therapy are one component of a multi-disciplinary discharge planning process, led by the attending physician.  Recommendations may be updated based on patient status, additional functional criteria and insurance authorization.  Follow up Recommendations Skilled nursing-short term rehab (<3 hours/day)      Assistance Recommended at Discharge Frequent or constant Supervision/Assistance  Functional Status Assessment Patient has had a recent decline in their functional status and demonstrates the ability to make significant improvements in function in a reasonable and predictable amount of time.  Frequency and Duration  min 2x/week  2 weeks       Prognosis Prognosis for Safe Diet Advancement: Good Barriers to Reach Goals: Cognitive deficits      Swallow Study   General HPI: Pt is an 81 yo male presenting after unwitnessed fall in the shower. CT Head showed 71m SDH along the L frontal convexity. MRI revealed likely acute CVA L pons and L corona radiata. MBS 01/08/22 with some oral holding and c/o globus sensation but with otherwise functional oropharyngeal swallow. speech/languag eveal 02/05/20 with cognitive  impairment and severe aphasia with expressive/receptive deficits (wernicke's-like appearance) as well as possible motor planning impairment. PMH includes: esophageal dysphagia and stricture, [facility] R ICA aneurysm s/p coiling, seizure disorder, smoker, chronic daily alcohol user, prostate ca s/p XRT, HTN, chronic hyponatremia Type of Study: Bedside Swallow Evaluation Previous Swallow Assessment: see HPI Diet Prior to this Study: NPO;NG Tube Temperature Spikes Noted: No Respiratory Status: Room air History of Recent Intubation: No Behavior/Cognition: Alert;Requires cueing Oral Cavity Assessment: Other (comment) (thick secretions) Oral Care Completed by SLP: Yes Oral Cavity - Dentition: Adequate natural dentition Vision: Functional for self-feeding Self-Feeding Abilities: Total assist Patient Positioning: Upright in bed Baseline Vocal Quality: Normal Volitional Cough: Weak Volitional Swallow: Able to elicit    Oral/Motor/Sensory Function Overall Oral Motor/Sensory Function:  (not following commands well enough for direct assessment)   Ice Chips Ice chips: Impaired Presentation: Spoon Oral Phase Functional Implications: Oral holding Pharyngeal Phase Impairments: Wet Vocal Quality   Thin Liquid Thin Liquid: Impaired Presentation: Spoon Oral Phase Functional Implications: Oral holding Pharyngeal  Phase Impairments: Cough - Immediate    Nectar Thick Nectar Thick Liquid: Not tested   Honey Thick Honey Thick Liquid: Not tested   Puree Puree: Not tested   Solid     Solid: Not tested      LOsie Bond, M.A. CPendletonOffice ([phone] Secure chat preferred  03/12/2022,11:09 AM

## 2022-03-12 NOTE — Progress Notes (Addendum)
STROKE TEAM PROGRESS NOTE   INTERVAL HISTORY SLP is at the bedside.  Seen in room, not interactive but eyes open, appears encephalopathic  MRI shows tiny left pontine punctate lacunar infarct.  MRI of the brain and neck are suboptimal showed no large vessel stenosis or occlusion.    Vitals:   03/11/22 2346 03/12/22 0449 03/12/22 0454 03/12/22 0752  BP: 131/76 112/70  113/62  Pulse: 84 76  83  Resp: '17 17  20  '$ Temp: 97.7 F (36.5 C) 97.6 F (36.4 C)  97.6 F (36.4 C)  TempSrc: Oral Oral  Oral  SpO2: 97% 99%  98%  Weight:   78.6 kg    CBC:  Recent Labs  Lab 03/09/22 1310 03/10/22 0451  WBC 7.9 8.8  NEUTROABS 5.6 6.7  HGB 13.2 12.9*  HCT 36.2* 35.2*  MCV 91.0 89.6  PLT 302 423   Basic Metabolic Panel:  Recent Labs  Lab 03/11/22 0754 03/12/22 0313  NA 125* 128*  K 3.8 3.9  CL 95* 92*  CO2 21* 25  GLUCOSE 153* 125*  BUN 13 16  CREATININE 0.76 0.69  CALCIUM 8.7* 9.2  MG 1.8 2.0  PHOS 2.6 2.4*   Lipid Panel: No results for input(s): "CHOL", "TRIG", "HDL", "CHOLHDL", "VLDL", "LDLCALC" in the last 168 hours. HgbA1c: No results for input(s): "HGBA1C" in the last 168 hours. Urine Drug Screen: No results for input(s): "LABOPIA", "COCAINSCRNUR", "LABBENZ", "AMPHETMU", "THCU", "LABBARB" in the last 168 hours.  Alcohol Level  Recent Labs  Lab 03/09/22 1755  ETH <10    IMAGING past 24 hours MR ANGIO HEAD WO CONTRAST  Result Date: 03/11/2022 CLINICAL DATA:  81 year old male with seizure like activity. Recent lacunar type infarcts in the left frontal white matter and left pons. 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 EXAM: MRA HEAD WITHOUT CONTRAST TECHNIQUE: Angiographic images of the Circle of Willis were acquired using MRA technique without intravenous contrast. COMPARISON:  CTA head and neck 03/09/2022.  Brain MRI 03/10/2022. FINDINGS: Anterior circulation: Antegrade flow in both ICA siphons. Mild right ICA supraclinoid  susceptibility artifact related to known stent and coil pack. No evidence of bilateral ICA siphon stenosis. No aneurysm flow signal. Patent carotid termini. Patent MCA and ACA origins. Dominant right ACA A1. Anterior communicating artery and visible ACA branches are within normal limits, the left A2 is dominant. Left MCA M1 segment and bifurcation appear patent without stenosis. Left MCA branches are stable and within normal limits. Right MCA M1 segment and bifurcation are patent without stenosis. Mild to moderate irregularity of the dominant right MCA M2 branch is stable from the recent CTA (series 253 image 11). Right MCA branches appears stable. Posterior circulation: Antegrade flow in the posterior circulation with codominant distal vertebral arteries. Patent PICA origins and vertebrobasilar junction. Patent basilar artery is mildly tortuous without stenosis. Patent SCA and PCA origins. Posterior communicating arteries are diminutive or absent. Tortuous left P1 segment. Bilateral PCA branches are within normal limits. Anatomic variants: Dominant right ACA A1 and left ACA A2. Other: No intracranial mass effect or ventriculomegaly. IMPRESSION: 1. Negative for large vessel occlusion. No significant posterior circulation stenosis. 2. Anterior circulation atherosclerosis most pronounced at the dominant Right MCA M2 branch with moderate to severe stenosis there stable from recent CTA (series 253 image 11 today). 3. Satisfactory appearance of stent-assisted distal Right ICA aneurysm embolization. Electronically Signed   By: Genevie Ann M.D.   On: 03/11/2022 10:27   MR ANGIO  NECK WO CONTRAST  Result Date: 03/11/2022 CLINICAL DATA:  81 year old male with seizure like activity. Recent lacunar type infarcts in the left frontal white matter and left pons. EXAM: MRA NECK WITHOUT CONTRAST TECHNIQUE: Angiographic images of the neck were acquired using MRA technique without intravenous contrast. Carotid stenosis measurements  (when applicable) are obtained utilizing NASCET criteria, using the distal internal carotid diameter as the denominator. COMPARISON:  Brain MRI 03/10/2022 and earlier. CTA head and neck 2 days ago on 03/09/2022. FINDINGS: Motion degraded exam despite repeated imaging attempts. Antegrade flow in both cervical carotid arteries to the skull base. Irregularity at both carotid bifurcations corresponding to recently demonstrated atherosclerosis and stenosis. But both vessels remain patent. Antegrade flow in both cervical vertebral arteries to the skull base. No significant extracranial vertebral artery stenosis on recent CTA. IMPRESSION: Motion degraded exam with continued patency of the cervical carotid and vertebral arteries. Carotid atherosclerosis and stenosis was better demonstrated on the recent CTA. Electronically Signed   By: Genevie Ann M.D.   On: 03/11/2022 10:20    PHYSICAL EXAM Mental status/Cognition: opens eyes to noxious stimuli. does not follow commands or answer any orientation questions. Speech/language: mumbles, does not follow commands. Does state "that hurt" and "stop" but does not answer questions Cranial nerves:   CN II Pupils equal and reactive to light,    CN III,IV,VI  no gaze preference or deviation, no nystagmus   CN V normal sensation in V1, V2, and V3 segments bilaterally   CN VII no asymmetry, no nasolabial fold flattening   CN VIII Turns head towards speech   CN IX & X Unable to assess but awake and protecting airway   CN XI Head midline.   CN XII midline tongue but does not protrude on command    Motor:  Muscle bulk: , tone normal Purposefully moves extremities  Sensation: Localizes to pain in all extremities   Coordination/Complex Motor:  - unable to assess.  ASSESSMENT/PLAN Carlos Wise is a 81 y.o. male with history of daily alcohol and tobacco use, seizure disorder, chronic hyponatremia, hypertension, prostate cancer s/p radiation, right ACA aneurysm s/p  coiling and recent left corona radiata lacunar infarction on 9/16 who presented to the ED for evaluation of seizure-like activity. On the day of presentation, patient's caregiver heard thud and witnessed the patient foaming at the mouth, making abnormal noises for 1 to 2 minutes before resolution.  Following this event the patient has been aphasic.   He was also noted to have a R scalp hematoma along with trace L convexity SAH, likely traumatic from fall. SAH is stable on repeat imaging.  Stroke:  Small acute infarcts in the left pons and left corona radiata  Etiology:  small vessel disease  CT head - Decreased conspicuity of previously noted 3 mm subdural hematoma along the left frontal convexity, now trace. No additional acute intracranial process. Increased size of previously noted right temporoparietal scalp hematoma. CTA head & neck No intracranial large vessel occlusion or no hemodynamically significant stenosis in the neck. MRI  acute stroke in L pons and in the L corona radiata. MRA  Anterior circulation atherosclerosis most pronounced at the dominant Right MCA M2 branch with moderate to severe stenosis there stable from recent CTA. Satisfactory appearance of stent-assisted distal Right ICA aneurysm embolization. EEG LTM- cortical dysfunction in left hemisphere likely secondary to underlying structural abnormality.  Additionally there is also cortical dysfunction in right temporal region likely secondary to underlying structural abnormality.  Lastly there is moderate diffuse encephalopathy, nonspecific etiology. No seizures or definite epileptiform discharges were seen throughout the recording. 2D Echo 60-65% LDL 161 HgbA1c 5.1 VTE prophylaxis - SCDs    Diet   Diet NPO time specified   aspirin 81 mg daily prior to admission, now on aspirin 81 mg daily.  Therapy recommendations:  SNF Disposition:  Pending  Hypertension Home meds:  None Stable Permissive hypertension (OK if < 220/120)  but gradually normalize in 5-7 days Long-term BP goal normotensive  Hyperlipidemia Home meds:  Atorvastatin '20mg'$  resumed in hospital LDL 161, goal < 70 Add Atorvastatin '80mg'$   Continue statin at discharge   ETOH on CIWA with phenobarbital  Other Stroke Risk Factors Advanced Age >/= 4  ETOH use, alcohol level <10, advised to drink no more than 2 drink(s) a day Hx stroke/TIA 9/16- right ACA aneurysm s/p coiling and recent left corona radiata lacunar infarction   Other Active Problems Seizure disorder  Hospital day # 3  Patient seen and examined by NP/APP with MD. MD to update note as needed.   Janine Ores, DNP, FNP-BC [facility] Pager: [phone]  STROKE MD NOTE :  Patient presented with altered mental status related to multifactorial encephalopathy and possible postictal state.  MRI scan shows tiny punctate pontine infarct likely from small vessel disease but clinically silent.  Recommend aspirin for stroke prevention and aggressive risk factor modifications.  Management of encephalopathy as per primary team.  Stroke team will sign off.  Kindly call for questions.  Greater than 50% time during this 50-minute visit was spent in counseling and coordination of care and discussion patient care team and answering questions.  Discussed with Dr.Xu Bernette Seeman,MD To contact Stroke Continuity provider, please refer to [email or web address] After hours, contact General Neurology

## 2022-03-12 NOTE — Progress Notes (Signed)
PROGRESS NOTE    [name]  VWU:[id number] DOB: 1941-05-02 DOA: 03/09/2022 PCP: [name], MD     Brief Narrative:   hypertension, hyperlipidemia, prostate cancer s/p radiation, seizure disorder, chronic hyponatremia, alcohol abuse (reportedly drinks 8 oz of gin daily), tobacco use 1 pack/day smoker, right ICA aneurysm s/p coiling, and recent left corona radiata acute lacunar infarction on 9/16 after being evaluated for altered mental status , Carlos Wise caregiver heard a bang while cleaning downstairs and went to find Carlos Wise had fallen after getting out of bed. He was noted to be making an abnormal noise and had liquid foaming at the mouth for 1 to 2 minutes before resolution.  Per triage RN " Pt was showering when he had an unwitnessed fall" He is aphasic on presentation,  Found to have small subdural bleed.  NSGY evaluated, no further workup needed, he is admitted to ICU for airway watch and high risk EtOH w/d, treated with phenobarb in the ICU for alcohol withdrawal He is now off phenobarb, maintaining airway, his transfer out of ICU on 9/28  Subjective:  He opens eyes spontaneously, talk a few word inconsistently, does not follow command, currently no agitation  He has a small bore feeding tube in place, appear tolerating tube feeds He has mittens on  Assessment & Plan:  Principal Problem:   Acute metabolic encephalopathy Active Problems:   Encephalopathy acute   SDH (subdural hematoma) (HCC)   Dysphagia   Moderate protein malnutrition ([place])   Cerebrovascular accident (CVA) due to occlusion of left pontine artery ([place])    Acute encephalopathy-likely multifactorial including post ictal, postconcussion, acute stroke, subdural hematoma, alcohol withdrawal, phenobarbital effect -Per chart review patient was hospitalized multiple times for encephalopathy -EEG LTM- cortical dysfunction in left hemisphere likely secondary to underlying structural abnormality.  Additionally  there is also cortical dysfunction in right temporal region likely secondary to underlying structural abnormality.  Lastly there is moderate diffuse encephalopathy, nonspecific etiology. No seizures or definite epileptiform discharges were seen throughout the recording -Currently appear maintaining airway, remain n.p.o., meds and nutrition for tube feeds   Small acute infarcts in the left pons and left corona radiata  Etiology:  small vessel disease Seen by neurology, currently receiving aspirin 81 mg, Lipitor 80 mg per tube  Alcohol abuse Received phenobarbital in the ICU Not on CIWA protocol       Nutritional Assessment: The patient's BMI is: Body mass index is 27.93 kg/m.Marland Kitchen Seen by dietician.  I agree with the assessment and plan as outlined below: Nutrition Status: Nutrition Problem: Moderate Malnutrition Etiology: social / environmental circumstances (EtOH abuse) Signs/Symptoms: moderate fat depletion, severe muscle depletion Interventions: Tube feeding, MVI  .   I have Reviewed nursing notes, Vitals, pain scores, I/o's, Lab results and  imaging results since pt's last encounter, details please see discussion above  I ordered the following labs:  Unresulted Labs (From admission, onward)     Start     Ordered   03/11/22 0500  Phosphorus  (ICU Tube Feeding: PEPuP )  Daily at 5am,   R     Question:  Specimen collection method  Answer:  Lab=Lab collect   03/10/22 1147   03/10/22 2956  Basic metabolic panel  Daily at 5am,   R      03/09/22 1632   03/10/22 0500  Magnesium  Daily at 5am,   R      03/09/22 1632   03/09/22 1632  Urine drugs of abuse  scrn w alc, routine (Ref Lab)  Once,   R        03/09/22 1632             DVT prophylaxis: SCDs Start: 03/09/22 1629   Code Status:   Code Status: Full Code  Family Communication: none at bedside Disposition:    Dispo: The patient is from: home              Anticipated d/c is to: TBD              Anticipated d/c  date is: Not ready to discharge, remain confused, strict n.p.o., receiving tube feeds  Antimicrobials:   Anti-infectives (From admission, onward)    Start     Dose/Rate Route Frequency Ordered Stop   03/10/22 1145  cefTRIAXone (ROCEPHIN) 1 g in sodium chloride 0.9 % 100 mL IVPB        1 g 200 mL/hr over 30 Minutes Intravenous Every 24 hours 03/10/22 1049 03/12/22 1424           Objective: Vitals:   03/12/22 0449 03/12/22 0454 03/12/22 0752 03/12/22 1608  BP: 112/70  113/62 129/77  Pulse: 76  83 80  Resp: '17  20 18  '$ Temp: 97.6 F (36.4 C)  97.6 F (36.4 C) (!) 97.3 F (36.3 C)  TempSrc: Oral  Oral Oral  SpO2: 99%  98% 100%  Weight:  78.6 kg 80.9 kg     Intake/Output Summary (Last 24 hours) at 03/12/2022 1924 Last data filed at 03/12/2022 1247 Gross per 24 hour  Intake 85 ml  Output 775 ml  Net -690 ml   Filed Weights   03/11/22 0500 03/12/22 0454 03/12/22 0752  Weight: 78.7 kg 78.6 kg 80.9 kg    Examination:  General exam: Open eyes spontaneously, on room air, talk a few word, does not follow command, no agitation Respiratory system: Clear to auscultation. Respiratory effort normal. Cardiovascular system:  RRR.  Gastrointestinal system: Abdomen is nondistended, soft and nontender.  Normal bowel sounds heard. Central nervous system: Alert , does not follow command Extremities:  no edema Skin: No rashes, lesions or ulcers Psychiatry: No agitation    Data Reviewed: I have personally reviewed  labs and visualized  imaging studies since the last encounter and formulate the plan        Scheduled Meds:  aspirin  81 mg Per Tube Daily   atorvastatin  80 mg Per Tube Daily   Chlorhexidine Gluconate Cloth  6 each Topical Daily   cholecalciferol  1,000 Units Per Tube Daily   feeding supplement (PROSource TF20)  60 mL Per Tube Daily   fluticasone  1 spray Each Nare Daily   folic acid  1 mg Per Tube Daily   gabapentin  150 mg Per Tube QHS   levETIRAcetam  1,000  mg Per Tube BID   multivitamin with minerals  1 tablet Per Tube Daily   nicotine  21 mg Transdermal Daily   mouth rinse  15 mL Mouth Rinse 4 times per day   pantoprazole  40 mg Per Tube Daily   sodium chloride  1 g Per Tube TID   thiamine  100 mg Per Tube Daily   vitamin B-12  100 mcg Per Tube Daily   Continuous Infusions:  feeding supplement (VITAL 1.5 CAL) 1,000 mL (03/12/22 1417)     LOS: 3 days     Florencia Reasons, MD PhD FACP [facility]  Available via Epic secure chat 7am-7pm for  nonurgent issues Please page for urgent issues To page the attending provider between 7A-7P or the covering provider during after hours 7P-7A, please log into the web site [email or web address] and access using universal [place] password for that web site. If you do not have the password, please call the hospital operator.    03/12/2022, 7:24 PM

## 2022-03-13 DIAGNOSIS — G9341 Metabolic encephalopathy: Secondary | ICD-10-CM | POA: Diagnosis not present

## 2022-03-13 LAB — BASIC METABOLIC PANEL
Anion gap: 11 (ref 5–15)
BUN: 14 mg/dL (ref 8–23)
CO2: 25 mmol/L (ref 22–32)
Calcium: 8.8 mg/dL — ABNORMAL LOW (ref 8.9–10.3)
Chloride: 91 mmol/L — ABNORMAL LOW (ref 98–111)
Creatinine, Ser: 0.64 mg/dL (ref 0.61–1.24)
GFR, Estimated: >60 mL/min (ref >60)
Glucose, Bld: 123 mg/dL — ABNORMAL HIGH (ref 70–99)
Potassium: 3.9 mmol/L (ref 3.5–5.1)
Sodium: 127 mmol/L — ABNORMAL LOW (ref 135–145)

## 2022-03-13 LAB — DRUG PROFILE 799023
Amobarbital, Ur: NEGATIVE
BARBITURATES: POSITIVE — AB
Butalbital, Ur: NEGATIVE
Pentobarbital, Ur: NEGATIVE
Phenobarbital, GC/MS: 5160 ng/mL
Phenobarbital,Ur: POSITIVE — AB
Secobarbital, Ur: NEGATIVE

## 2022-03-13 LAB — GLUCOSE, CAPILLARY
Glucose-Capillary: 118 mg/dL — ABNORMAL HIGH (ref 70–99)
Glucose-Capillary: 120 mg/dL — ABNORMAL HIGH (ref 70–99)
Glucose-Capillary: 121 mg/dL — ABNORMAL HIGH (ref 70–99)
Glucose-Capillary: 123 mg/dL — ABNORMAL HIGH (ref 70–99)
Glucose-Capillary: 124 mg/dL — ABNORMAL HIGH (ref 70–99)
Glucose-Capillary: 128 mg/dL — ABNORMAL HIGH (ref 70–99)

## 2022-03-13 LAB — URINE DRUGS OF ABUSE SCREEN W ALC, ROUTINE (REF LAB)
Amphetamines, Urine: NEGATIVE ng/mL
Benzodiazepine Quant, Ur: NEGATIVE ng/mL
Cannabinoid Quant, Ur: NEGATIVE ng/mL
Cocaine (Metab.): NEGATIVE ng/mL
Ethanol U, Quan: NEGATIVE %
Methadone Screen, Urine: NEGATIVE ng/mL
Opiate Quant, Ur: NEGATIVE ng/mL
Phencyclidine, Ur: NEGATIVE ng/mL
Propoxyphene, Urine: NEGATIVE ng/mL

## 2022-03-13 LAB — MAGNESIUM: Magnesium: 1.9 mg/dL (ref 1.7–2.4)

## 2022-03-13 LAB — PHOSPHORUS: Phosphorus: 3.1 mg/dL (ref 2.5–4.6)

## 2022-03-13 NOTE — Progress Notes (Signed)
PROGRESS NOTE    Carlos Wise  IOM:[id number] DOB: 05/13/1941 DOA: 03/09/2022 PCP: Carlos Sake, MD     Brief Narrative:   hypertension, hyperlipidemia, prostate cancer s/p radiation, seizure disorder, chronic hyponatremia, alcohol abuse (reportedly drinks 8 oz of gin daily), tobacco use 1 pack/day smoker, right ICA aneurysm s/p coiling, and recent left corona radiata acute lacunar infarction on 9/16 after being evaluated for altered mental status , Carlos Wise caregiver heard a bang while cleaning downstairs and went to find Carlos Wise had fallen after getting out of bed. He was noted to be making an abnormal noise and had liquid foaming at the mouth for 1 to 2 minutes before resolution.  Per triage RN " Pt was showering when he had an unwitnessed fall" He is aphasic on presentation,  Found to have small subdural bleed.  NSGY evaluated, no further workup needed, he is admitted to ICU for airway watch and high risk EtOH w/d, treated with phenobarb in the ICU for alcohol withdrawal He is now off phenobarb, maintaining airway, his transfer out of ICU on 9/28  Subjective:  He appears is improving, more alert and talking more, he is able to tell me his birthdate, he states he is [place]. He does not think he is in the hospital, he is not oriented to time  Attempt to follow commands, currently no agitation  He has a small bore feeding tube in place, appear tolerating tube feeds He has mittens on  Assessment & Plan:  Principal Problem:   Acute metabolic encephalopathy Active Problems:   Encephalopathy acute   SDH (subdural hematoma) (HCC)   Dysphagia   Moderate protein malnutrition (HCC)   Cerebrovascular accident (CVA) due to occlusion of left pontine artery ([place])    Acute encephalopathy-likely multifactorial including post ictal, postconcussion, acute stroke, subdural hematoma, alcohol withdrawal, phenobarbital effect -Per chart review patient was hospitalized multiple times  for encephalopathy -EEG LTM- cortical dysfunction in left hemisphere likely secondary to underlying structural abnormality.  Additionally there is also cortical dysfunction in right temporal region likely secondary to underlying structural abnormality.  Lastly there is moderate diffuse encephalopathy, nonspecific etiology. No seizures or definite epileptiform discharges were seen throughout the recording -maintaining airway,  appear slowly improving, remain n.p.o., meds and nutrition for tube feeds, will repeat swallow eval   Small acute infarcts in the left pons and left corona radiata  Etiology:  small vessel disease Seen by neurology, currently receiving aspirin 81 mg, Lipitor 80 mg per tube  Hyponatremia Appear slowly improving  Alcohol abuse Received phenobarbital in the ICU Not on CIWA protocol       Nutritional Assessment: The patient's BMI is: Body mass index is 27.9 kg/m.Marland Kitchen Seen by dietician.  I agree with the assessment and plan as outlined below: Nutrition Status: Nutrition Problem: Moderate Malnutrition Etiology: social / environmental circumstances (EtOH abuse) Signs/Symptoms: moderate fat depletion, severe muscle depletion Interventions: Tube feeding, MVI  .   I have Reviewed nursing notes, Vitals, pain scores, I/o's, Lab results and  imaging results since pt's last encounter, details please see discussion above  I ordered the following labs:  Unresulted Labs (From admission, onward)     Start     Ordered   03/11/22 0500  Phosphorus  (ICU Tube Feeding: PEPuP )  Daily at 5am,   R     Question:  Specimen collection method  Answer:  Lab=Lab collect   03/10/22 1147   03/10/22 8453  Basic metabolic panel  Daily at 5am,   R      03/09/22 1632   03/10/22 0500  Magnesium  Daily at 5am,   R      03/09/22 1632             DVT prophylaxis: SCDs Start: 03/09/22 1629   Code Status:   Code Status: Full Code  Family Communication: none at bedside Disposition:     Dispo: The patient is from: home              Anticipated d/c is to: TBD              Anticipated d/c date is: Not ready to discharge, remain confused, strict n.p.o., receiving tube feeds  Antimicrobials:   Anti-infectives (From admission, onward)    Start     Dose/Rate Route Frequency Ordered Stop   03/10/22 1145  cefTRIAXone (ROCEPHIN) 1 g in sodium chloride 0.9 % 100 mL IVPB        1 g 200 mL/hr over 30 Minutes Intravenous Every 24 hours 03/10/22 1049 03/12/22 1424           Objective: Vitals:   03/13/22 0500 03/13/22 0525 03/13/22 0747 03/13/22 1458  BP:  132/74 139/83 128/76  Pulse:  72 77 82  Resp:  '16 18 16  '$ Temp:  98.1 F (36.7 C) 98.8 F (37.1 C) 98.6 F (37 C)  TempSrc:   Oral Oral  SpO2:  97% 97% 96%  Weight: 80.8 kg       Intake/Output Summary (Last 24 hours) at 03/13/2022 1919 Last data filed at 03/13/2022 0500 Gross per 24 hour  Intake --  Output 900 ml  Net -900 ml   Filed Weights   03/12/22 0454 03/12/22 0752 03/13/22 0500  Weight: 78.6 kg 80.9 kg 80.8 kg    Examination:  General exam: Appear more alert, talking more, oriented to self, not to place or time, no agitation, + small bore feeding tube in nare Respiratory system: Clear to auscultation. Respiratory effort normal. Cardiovascular system:  RRR.  Gastrointestinal system: Abdomen is nondistended, soft and nontender.  Normal bowel sounds heard. Central nervous system: Alert , does not follow command Extremities:  no edema Skin: No rashes, lesions or ulcers Psychiatry: No agitation    Data Reviewed: I have personally reviewed  labs and visualized  imaging studies since the last encounter and formulate the plan        Scheduled Meds:  aspirin  81 mg Per Tube Daily   atorvastatin  80 mg Per Tube Daily   Chlorhexidine Gluconate Cloth  6 each Topical Daily   cholecalciferol  1,000 Units Per Tube Daily   feeding supplement (PROSource TF20)  60 mL Per Tube Daily   fluticasone  1  spray Each Nare Daily   folic acid  1 mg Per Tube Daily   gabapentin  150 mg Per Tube QHS   levETIRAcetam  1,000 mg Per Tube BID   multivitamin with minerals  1 tablet Per Tube Daily   nicotine  21 mg Transdermal Daily   mouth rinse  15 mL Mouth Rinse 4 times per day   pantoprazole  40 mg Per Tube Daily   sodium chloride  1 g Per Tube TID   thiamine  100 mg Per Tube Daily   vitamin B-12  100 mcg Per Tube Daily   Continuous Infusions:  feeding supplement (VITAL 1.5 CAL) 1,000 mL (03/13/22 1200)     LOS: 4 days  Florencia Reasons, MD PhD FACP [facility]  Available via Epic secure chat 7am-7pm for nonurgent issues Please page for urgent issues To page the attending provider between 7A-7P or the covering provider during after hours 7P-7A, please log into the web site [email or web address] and access using universal [place] password for that web site. If you do not have the password, please call the hospital operator.    03/13/2022, 7:19 PM

## 2022-03-13 NOTE — Progress Notes (Signed)
Nutrition Follow-up  DOCUMENTATION CODES:  Non-severe (moderate) malnutrition in context of social or environmental circumstances  INTERVENTION:  Continue tube feeds via Cortrak: Vital 1.5 @ 55 ml/hr (1320 ml/day) PROSource TF20 60 ml daily. Tube feeding regimen at goal provides 2060 kcal, 109 grams of protein, and 1008 ml of H2O. Continue MVI with minerals daily per tube Monitor for diet advancement  NUTRITION DIAGNOSIS:  Moderate Malnutrition related to social / environmental circumstances (EtOH abuse) as evidenced by moderate fat depletion, severe muscle depletion. - remains applicable  GOAL:  Patient will meet greater than or equal to 90% of their needs - progressing, TF providing nutrition  MONITOR:  Diet advancement, Labs, Weight trends, TF tolerance  REASON FOR ASSESSMENT:  Consult Enteral/tube feeding initiation and management  ASSESSMENT:  81 year old male who presented to the ED on 9/25 after an unwitnessed fall. PMH of R ICA aneurysm s/p coiling, stroke, HLD, HTN, EtOH abuse, tobacco abuse, seizures, prostate cancer s/p radiation therapy, esophageal dysphagia and stricture. Pt admitted with acute encephalopathy in setting of seizure activity, SDH.  Pt resting in bed with TF infusing at goal. Caregiver visiting. Pt does engage in some conversation. States he is hungry since he hasn't eaten in a week. Reminded that he was being TF. Appears to have good tolerance with TF, BM recorded yesterday.   Nutritionally Relevant Medications: Scheduled Meds:  atorvastatin  80 mg Per Tube Daily   cholecalciferol  1,000 Units Per Tube Daily   PROSource TF20  60 mL Per Tube Daily   folic acid  1 mg Per Tube Daily   multivitamin with minerals  1 tablet Per Tube Daily   pantoprazole  40 mg Per Tube Daily   thiamine  100 mg Per Tube Daily   vitamin B-12  100 mcg Per Tube Daily   Continuous Infusions:  feeding supplement (VITAL 1.5 CAL) 1,000 mL (03/12/22 1417)   PRN  Meds:.docusate, ondansetron, polyethylene glycol  Labs Reviewed: Na 127, chloride 91 CBG ranges from 103-134 mg/dL over the last 24 hours  NUTRITION - FOCUSED PHYSICAL EXAM: Flowsheet Row Most Recent Value  Orbital Region Moderate depletion  Upper Arm Region Moderate depletion  Thoracic and Lumbar Region Moderate depletion  Buccal Region Mild depletion  Temple Region Moderate depletion  Clavicle Bone Region Moderate depletion  Clavicle and Acromion Bone Region Moderate depletion  Scapular Bone Region Moderate depletion  Dorsal Hand Mild depletion  Patellar Region Moderate depletion  Anterior Thigh Region Severe depletion  Posterior Calf Region Severe depletion  Edema (RD Assessment) None  Hair Reviewed  Eyes Reviewed  Mouth Reviewed  Skin Reviewed  Nails Reviewed    Diet Order:   Diet Order             Diet NPO time specified  Diet effective now                   EDUCATION NEEDS:  Not appropriate for education at this time  Skin:  Skin Assessment: Reviewed RN Assessment  Last BM:  9/29 - type 2  Height:  Ht Readings from Last 1 Encounters:  02/28/22 '5\' 7"'$  (1.702 m)    Weight:  Wt Readings from Last 1 Encounters:  03/13/22 80.8 kg    Ideal Body Weight:  67.3 kg  BMI:  Body mass index is 27.9 kg/m.  Estimated Nutritional Needs:  Kcal:  1900-2100 Protein:  95-110 grams Fluid:  1.9-2.1 L    Ranell Patrick, RD, LDN Clinical Dietitian RD pager # available  in [facility]  After hours/weekend pager # available in [facility]

## 2022-03-13 NOTE — Evaluation (Signed)
Physical Therapy Evaluation Patient Details Name: Carlos Wise MRN: [id number] DOB: [birth year] Today's Date: 03/13/2022  History of Present Illness  Patient is a 81 year old male with a  history of prostate cancer status, hypertension, esophageal dysphagia and stricture, right ICA aneurysm status post coiling, seizure disorder, chronic hyponatremia, smoker, chronic daily alcohol user, and recent admission with left lacunar stroke, )who presented to the emergency department for evaluation status post unwitnessed fall with head strike. MRI revealed likely acute CVA L pons. Multifactorial acute encephalopathy. also noted to have a R scalp hematoma along with trace L convexity SAH, likely traumatic from fall. SAH is stable on repeat imaging.   Clinical Impression  Patient is sleeping on arrival to room. He wakes up to voice. He is able to tell me his name and birthday. He is able to follow most single step commands with increased time. Slow processing with cues required for task initiation, sequencing with functional activity. Prior level of function and home living situation gathered from the chart from a previous recent admission as patient is a poor historian.  Significant assistance is required for bed mobility. The patient sat up for several minutes with external support required to maintain sitting balance. He is able to activate movement in all extremities but has diffuse weakness throughout. Unable to attempt standing due to poor sitting balance and decreased activity tolerance. Recommend to continue PT to maximize independence and decrease caregiver burden. SNF is recommended at discharge.      Recommendations for follow up therapy are one component of a multi-disciplinary discharge planning process, led by the attending physician.  Recommendations may be updated based on patient status, additional functional criteria and insurance authorization.  Follow Up Recommendations Skilled nursing-short  term rehab (<3 hours/day) Can patient physically be transported by private vehicle: No    Assistance Recommended at Discharge Frequent or constant Supervision/Assistance  Patient can return home with the following  Two people to help with walking and/or transfers;A lot of help with bathing/dressing/bathroom;Assistance with feeding;Direct supervision/assist for medications management;Direct supervision/assist for financial management;Assist for transportation;Help with stairs or ramp for entrance    Equipment Recommendations None recommended by PT  Recommendations for Other Services  OT consult    Functional Status Assessment Patient has had a recent decline in their functional status and demonstrates the ability to make significant improvements in function in a reasonable and predictable amount of time.     Precautions / Restrictions Precautions Precautions: Fall Restrictions Weight Bearing Restrictions: No      Mobility  Bed Mobility Overal bed mobility: Needs Assistance Bed Mobility: Supine to Sit, Sit to Supine     Supine to sit: Max assist, HOB elevated Sit to supine: Total assist   General bed mobility comments: assistance for trunk and BLE support. patient does initiate movement with UE and LE to sit at edge of bed, however limited participation with return to supine position    Transfers                   General transfer comment: unsafe to attempt due to poor sitting balance and limited activity tolerance    Ambulation/Gait                  Stairs            Wheelchair Mobility    Modified Rankin (Stroke Patients Only)       Balance Overall balance assessment: Needs assistance Sitting-balance support: Feet unsupported, Bilateral upper extremity  supported Sitting balance-Leahy Scale: Zero Sitting balance - Comments: minimal protective righting reactions with loss of balance to the right. Max A external support required to maintain  midline Postural control: Right lateral lean                                   Pertinent Vitals/Pain Pain Assessment Pain Assessment: Faces Faces Pain Scale: Hurts a little bit Pain Location: generalized moaning with repositioning, unable to specify where Pain Descriptors / Indicators: Moaning Pain Intervention(s): Limited activity within patient's tolerance, Monitored during session, Repositioned    Home Living Family/patient expects to be discharged to:: Private residence Living Arrangements: Alone Available Help at Discharge: Personal care attendant;Available 24 hours/day Type of Home: House Home Access: Stairs to enter   [employer] of Steps: flight from garage, has chairlift   Home Layout: Multi-level Home Equipment: Conservation officer, nature (2 wheels);Shower seat Additional Comments: patient was unable to provide information. gathered home living situation from previous recent hospital admission    Prior Function Prior Level of Function : Patient poor historian/Family not available;Needs assist             Mobility Comments: per chart patient was ambulatory at home with occasional use of RW ADLs Comments: has caregivers but is able to participate with ADLs     Hand Dominance   Dominant Hand: Right    Extremity/Trunk Assessment   Upper Extremity Assessment Upper Extremity Assessment: Generalized weakness (can activate shoulder and elbow movement bilaterally. unable to follow complex commands for formal MMT. dealyed movement throughout)    Lower Extremity Assessment Lower Extremity Assessment: Difficult to assess due to impaired cognition (patient can activate hip/knee/ankle movement with delay. unable to formally MMT due to inability to follow complex commands. hypertonicity in LLE with PROM. generalized weakness throughout)    Cervical / Trunk Assessment Cervical / Trunk Assessment: Kyphotic  Communication   Communication: HOH  Cognition  Arousal/Alertness: Lethargic Behavior During Therapy: Flat affect Overall Cognitive Status: History of cognitive impairments - at baseline Area of Impairment: Orientation, Following commands, Problem solving                 Orientation Level: Disoriented to, Place, Time, Situation     Following Commands: Follows one step commands with increased time     Problem Solving: Slow processing, Decreased initiation, Difficulty sequencing, Requires verbal cues, Requires tactile cues General Comments: patient has eyes opened most of the time but does close them for brief periods of time. he is able to tell me his name and his birthdate. he states he lives alone but confirms he has caregivers in the home. poor historian.        General Comments      Exercises     Assessment/Plan    PT Assessment Patient needs continued PT services  PT Problem List Decreased strength;Decreased range of motion;Decreased activity tolerance;Decreased balance;Decreased cognition;Decreased knowledge of use of DME;Decreased safety awareness;Decreased mobility;Decreased coordination       PT Treatment Interventions DME instruction;Gait training;Functional mobility training;Therapeutic exercise;Therapeutic activities;Neuromuscular re-education;Balance training;Cognitive remediation;Patient/family education;Wheelchair mobility training    PT Goals (Current goals can be found in the Care Plan section)  Acute Rehab PT Goals Patient Stated Goal: patient unable to participate in goal setting PT Goal Formulation: Patient unable to participate in goal setting Time For Goal Achievement: 03/20/22 Potential to Achieve Goals: Fair    Frequency Min 3X/week  Co-evaluation               AM-PAC PT "6 Clicks" Mobility  Outcome Measure Help needed turning from your back to your side while in a flat bed without using bedrails?: A Lot Help needed moving from lying on your back to sitting on the side of a flat  bed without using bedrails?: A Lot Help needed moving to and from a bed to a chair (including a wheelchair)?: Total Help needed standing up from a chair using your arms (e.g., wheelchair or bedside chair)?: Total Help needed to walk in hospital room?: Total Help needed climbing 3-5 steps with a railing? : Total 6 Click Score: 8    End of Session   Activity Tolerance: Patient tolerated treatment well;Patient limited by fatigue Patient left: in bed;with call bell/phone within reach;with bed alarm set Nurse Communication: Mobility status PT Visit Diagnosis: Unsteadiness on feet (R26.81);Muscle weakness (generalized) (M62.81)    Time: 2725-3664 PT Time Calculation (min) (ACUTE ONLY): 16 min   Charges:   PT Evaluation $PT Eval Low Complexity: 1 Low         Minna Merritts, PT, MPT   Percell Locus 03/13/2022, 9:31 AM

## 2022-03-14 DIAGNOSIS — G9341 Metabolic encephalopathy: Secondary | ICD-10-CM | POA: Diagnosis not present

## 2022-03-14 LAB — GLUCOSE, CAPILLARY
Glucose-Capillary: 111 mg/dL — ABNORMAL HIGH (ref 70–99)
Glucose-Capillary: 115 mg/dL — ABNORMAL HIGH (ref 70–99)
Glucose-Capillary: 122 mg/dL — ABNORMAL HIGH (ref 70–99)
Glucose-Capillary: 125 mg/dL — ABNORMAL HIGH (ref 70–99)
Glucose-Capillary: 129 mg/dL — ABNORMAL HIGH (ref 70–99)
Glucose-Capillary: 137 mg/dL — ABNORMAL HIGH (ref 70–99)

## 2022-03-14 LAB — BASIC METABOLIC PANEL
Anion gap: 9 (ref 5–15)
BUN: 16 mg/dL (ref 8–23)
CO2: 23 mmol/L (ref 22–32)
Calcium: 9.1 mg/dL (ref 8.9–10.3)
Chloride: 96 mmol/L — ABNORMAL LOW (ref 98–111)
Creatinine, Ser: 0.62 mg/dL (ref 0.61–1.24)
GFR, Estimated: >60 mL/min (ref >60)
Glucose, Bld: 111 mg/dL — ABNORMAL HIGH (ref 70–99)
Potassium: 4.1 mmol/L (ref 3.5–5.1)
Sodium: 128 mmol/L — ABNORMAL LOW (ref 135–145)

## 2022-03-14 LAB — PHOSPHORUS: Phosphorus: 3.9 mg/dL (ref 2.5–4.6)

## 2022-03-14 LAB — SODIUM, URINE, RANDOM: Sodium, Ur: 129 mmol/L

## 2022-03-14 LAB — MAGNESIUM: Magnesium: 2 mg/dL (ref 1.7–2.4)

## 2022-03-14 NOTE — Progress Notes (Signed)
Speech Language Pathology Treatment: Dysphagia  Patient Details Name: Carlos Wise MRN: [id number] DOB: 1941/05/25 Today's Date: 03/14/2022 Time: 3474-2595 SLP Time Calculation (min) (ACUTE ONLY): 23 min  Assessment / Plan / Recommendation Clinical Impression  SLP followed up for dysphagia management. Pt was able to open eyes for short durations (less than one minute) to participate in PO trials; requiring consistent cueing to improve mentation. He is still exhibiting reduced management of saliva with dark lingual buildup on posterior portion of tongue; SLP provided diligent oral care. Pt exhibited overt isolated coughing with thin liquids via cup, delayed coughing and wet vocal quality with nectar and honey thick liquids concerning for reduced airway protection. Pt was without overt s/sx of aspiration with single ice chips however appears higher risk for reduced pharyngeal and laryngeal sensation in setting of prior CVAs. Recommend continue NPO with ice chips following diligent oral care as tolerated and meds via alternative means. Recommend future MBSS given documented clinical decline with swallow function this admission. SLP to continue to follow.    HPI HPI: Pt is an 81 yo male presenting after unwitnessed fall in the shower. CT Head showed 75m SDH along the L frontal convexity. MRI revealed likely acute CVA L pons and L corona radiata. MBS 01/08/22 with some oral holding and c/o globus sensation but with otherwise functional oropharyngeal swallow. speech/languag eveal 02/05/20 with cognitive impairment and severe aphasia with expressive/receptive deficits (wernicke's-like appearance) as well as possible motor planning impairment. PMH includes: esophageal dysphagia and stricture[facility] R ICA aneurysm s/p coiling, seizure disorder, smoker, chronic daily alcohol user, prostate ca s/p XRT, HTN, chronic hyponatremia      SLP Plan  MBS      Recommendations for  follow up therapy are one component of a multi-disciplinary discharge planning process, led by the attending physician.  Recommendations may be updated based on patient status, additional functional criteria and insurance authorization.    Recommendations  Diet recommendations: NPO Medication Administration: Via alternative means                Oral Care Recommendations: Oral care prior to ice chip/H20;Oral care QID Follow Up Recommendations: Skilled nursing-short term rehab (<3 hours/day) Assistance recommended at discharge: Frequent or constant Supervision/Assistance SLP Visit Diagnosis: Dysphagia, unspecified (R13.10) Plan: MBS           Carlos Wise H. MA, CCC-SLP Acute Rehabilitation Services    03/14/2022, 9:05 AM

## 2022-03-14 NOTE — Progress Notes (Signed)
Refused blood works.

## 2022-03-14 NOTE — Progress Notes (Addendum)
PROGRESS NOTE    Carlos Wise  Carlos Wise:[id number] DOB: 1941/01/01 DOA: 03/09/2022 PCP: Carlos Sake, MD     Brief Narrative:   hypertension, hyperlipidemia, prostate cancer s/p radiation, seizure disorder, chronic hyponatremia, alcohol abuse (reportedly drinks 8 oz of gin daily), tobacco use 1 pack/day smoker, right ICA aneurysm s/p coiling, and recent left corona radiata acute lacunar infarction on 9/16 after being evaluated for altered mental status , Carlos Wise caregiver heard a bang while cleaning downstairs and went to find Carlos Wise had fallen after getting out of bed. He was noted to be making an abnormal noise and had liquid foaming at the mouth for 1 to 2 minutes before resolution.  Per triage RN " Pt was showering when he had an unwitnessed fall" He is aphasic on presentation,  Found to have small subdural bleed.  NSGY evaluated, no further workup needed, he is admitted to ICU for airway watch and high risk EtOH w/d, treated with phenobarb in the ICU for alcohol withdrawal He is now off phenobarb, maintaining airway, his transfer out of ICU on 9/28  Subjective:  No acute events reported overnight He is not as interactive  Remain npo,  currently no agitation  He has a small bore feeding tube in place, appear tolerating tube feeds He has mittens on  Assessment & Plan:  Principal Problem:   Acute metabolic encephalopathy Active Problems:   Encephalopathy acute   SDH (subdural hematoma) (HCC)   Dysphagia   Moderate protein malnutrition (HCC)   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  Additionally there is  also cortical dysfunction in right temporal region likely secondary to underlying structural abnormality.  Lastly there is moderate diffuse encephalopathy, nonspecific etiology. No seizures or definite epileptiform discharges were seen throughout the recording -maintaining airway,  remain n.p.o., meds and nutrition for tube feeds   Small acute infarcts in the left pons and left corona radiata  Etiology:  small vessel disease Seen by neurology, currently receiving aspirin 81 mg, Lipitor 80 mg per tube  Hyponatremia Appears slowly improving Check urine sodium, urine osmolarity, serum osmolarity, uric acid  Alcohol abuse Received phenobarbital in the ICU Not on CIWA protocol     Nutritional Assessment: The patient's BMI is: Body mass index is 26.41 kg/m.Marland Kitchen Seen by dietician.  I agree with the assessment and plan as outlined below: Nutrition Status: Nutrition Problem: Moderate Malnutrition Etiology: social / environmental circumstances (EtOH abuse) Signs/Symptoms: moderate fat depletion, severe muscle depletion Interventions: Tube feeding, MVI  .   I have Reviewed nursing notes, Vitals, pain scores, I/o's, Lab results and  imaging results since pt's last encounter, details please see discussion above  I ordered the following labs:  Unresulted Labs (From admission, onward)     Start     Ordered   03/15/22 0623  Basic metabolic panel  Daily at 5am,   R     Question:  Specimen collection method  Answer:  Lab=Lab collect   03/14/22 1601   03/15/22 0500  Osmolality  Tomorrow morning,   R       Question:  Specimen collection method  Answer:  Lab=Lab collect   03/14/22 1601   03/15/22 0500  Uric acid  Tomorrow morning,  R       Question:  Specimen collection method  Answer:  Lab=Lab collect   03/14/22 1615   03/14/22 1601  Sodium, urine, random  Once,   R        03/14/22 1600   03/14/22 1601  Osmolality, urine  Once,   R        03/14/22 1600             DVT prophylaxis:  SCDs Start: 03/09/22 1629   Code Status:   Code Status: Full Code  Family Communication: called [facility] and left a message on 9/30 Disposition:    Dispo: The patient is from: home              Anticipated d/c is to: TBD              Anticipated d/c date is: Not ready to discharge, remain confused, strict n.p.o., receiving tube feeds  Antimicrobials:   Anti-infectives (From admission, onward)    Start     Dose/Rate Route Frequency Ordered Stop   03/10/22 1145  cefTRIAXone (ROCEPHIN) 1 g in sodium chloride 0.9 % 100 mL IVPB        1 g 200 mL/hr over 30 Minutes Intravenous Every 24 hours 03/10/22 1049 03/12/22 1424           Objective: Vitals:   03/14/22 0500 03/14/22 0645 03/14/22 0847 03/14/22 1600  BP:  135/69 (!) 135/97 125/77  Pulse:  71 94 71  Resp:   18 20  Temp:   98.6 F (37 C)   TempSrc:   Oral   SpO2:  96% 100% 96%  Weight: 76.5 kg       Intake/Output Summary (Last 24 hours) at 03/14/2022 1616 Last data filed at 03/14/2022 1000 Gross per 24 hour  Intake 30 ml  Output 1025 ml  Net -995 ml   Filed Weights   03/12/22 0752 03/13/22 0500 03/14/22 0500  Weight: 80.9 kg 80.8 kg 76.5 kg    Examination:  General exam: Not as interactive as of yesterday,  no agitation, + small bore feeding tube in nare, moving extremities spontaneously Respiratory system: Clear to auscultation. Respiratory effort normal. Cardiovascular system:  RRR.  Gastrointestinal system: Abdomen is nondistended, soft and nontender.  Normal bowel sounds heard. Central nervous system: Alert , does not follow command Extremities:  no edema Skin: No rashes, lesions or ulcers Psychiatry: No agitation    Data Reviewed: I have personally reviewed  labs and visualized  imaging studies since the last encounter and formulate the plan        Scheduled Meds:  aspirin  81 mg Per Tube Daily   atorvastatin  80 mg Per Tube Daily   Chlorhexidine Gluconate Cloth  6 each Topical Daily    cholecalciferol  1,000 Units Per Tube Daily   feeding supplement (PROSource TF20)  60 mL Per Tube Daily   fluticasone  1 spray Each Nare Daily   folic acid  1 mg Per Tube Daily   gabapentin  150 mg Per Tube QHS   levETIRAcetam  1,000 mg Per Tube BID   multivitamin with minerals  1 tablet Per Tube Daily   nicotine  21 mg Transdermal Daily   mouth rinse  15 mL Mouth Rinse 4 times per day   pantoprazole  40 mg Per Tube Daily   sodium chloride  1 g Per Tube TID   thiamine  100 mg Per Tube Daily   vitamin B-12  100 mcg Per Tube Daily   Continuous Infusions:  feeding supplement (VITAL 1.5 CAL) 1,000 mL (03/14/22 0927)     LOS: 5 days     Florencia Reasons, MD PhD FACP [facility]  Available via Epic secure chat 7am-7pm for nonurgent issues Please page for urgent issues To page the attending provider between 7A-7P or the covering provider during after hours 7P-7A, please log into the web site [email or web address] and access using universal  password for that web site. If you do not have the password, please call the hospital operator.    03/14/2022, 4:16 PM

## 2022-03-15 DIAGNOSIS — G9341 Metabolic encephalopathy: Secondary | ICD-10-CM | POA: Diagnosis not present

## 2022-03-15 LAB — BASIC METABOLIC PANEL
Anion gap: 11 (ref 5–15)
BUN: 19 mg/dL (ref 8–23)
CO2: 24 mmol/L (ref 22–32)
Calcium: 9.2 mg/dL (ref 8.9–10.3)
Chloride: 95 mmol/L — ABNORMAL LOW (ref 98–111)
Creatinine, Ser: 0.67 mg/dL (ref 0.61–1.24)
GFR, Estimated: >60 mL/min (ref >60)
Glucose, Bld: 108 mg/dL — ABNORMAL HIGH (ref 70–99)
Potassium: 4.2 mmol/L (ref 3.5–5.1)
Sodium: 130 mmol/L — ABNORMAL LOW (ref 135–145)

## 2022-03-15 LAB — GLUCOSE, CAPILLARY
Glucose-Capillary: 109 mg/dL — ABNORMAL HIGH (ref 70–99)
Glucose-Capillary: 109 mg/dL — ABNORMAL HIGH (ref 70–99)
Glucose-Capillary: 123 mg/dL — ABNORMAL HIGH (ref 70–99)
Glucose-Capillary: 132 mg/dL — ABNORMAL HIGH (ref 70–99)
Glucose-Capillary: 160 mg/dL — ABNORMAL HIGH (ref 70–99)
Glucose-Capillary: 98 mg/dL (ref 70–99)

## 2022-03-15 LAB — URIC ACID: Uric Acid, Serum: 1.9 mg/dL — ABNORMAL LOW (ref 3.7–8.6)

## 2022-03-15 LAB — OSMOLALITY: Osmolality: 272 mOsm/kg — ABNORMAL LOW (ref 275–295)

## 2022-03-15 LAB — OSMOLALITY, URINE: Osmolality, Ur: 918 mOsm/kg — ABNORMAL HIGH (ref 300–900)

## 2022-03-15 NOTE — Progress Notes (Addendum)
PROGRESS NOTE    Carlos Wise  YIR:[id number] DOB: 1941-05-11 DOA: 03/09/2022 PCP: Carlos Sake, MD     Brief Narrative:   hypertension, hyperlipidemia, prostate cancer s/p radiation, seizure disorder, chronic hyponatremia, alcohol abuse (reportedly drinks 8 oz of gin daily), tobacco use 1 pack/day smoker, right ICA aneurysm s/p coiling, and recent left corona radiata acute lacunar infarction on 9/16 after being evaluated for altered mental status , Carlos Wise caregiver heard a bang while cleaning downstairs and went to find Carlos Wise had fallen after getting out of bed. He was noted to be making an abnormal noise and had liquid foaming at the mouth for 1 to 2 minutes before resolution.  Per triage RN " Pt was showering when he had an unwitnessed fall" He is aphasic on presentation,  Found to have small subdural bleed.  NSGY evaluated, no further workup needed, he is admitted to ICU for airway watch and high risk EtOH w/d, treated with phenobarb in the ICU for alcohol withdrawal He is now off phenobarb, maintaining airway, his transfer out of ICU on 9/28  Subjective:   Urine output 575 cc per last 24 hours documented, not sure if accurate No acute events reported overnight No significant improvement in mental status, remain drowsy, he has not received any sedating meds the last 48hrs Remain npo,   no agitation  He has a small bore feeding tube in place, appear tolerating tube feeds He has mittens on  Assessment & Plan:  Principal Problem:   Acute metabolic encephalopathy Active Problems:   Encephalopathy acute   SDH (subdural hematoma) (HCC)   Dysphagia   Moderate protein malnutrition ([place])   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  Additionally there is also cortical dysfunction in right temporal region likely secondary to underlying structural abnormality.  Lastly there is moderate diffuse encephalopathy, nonspecific etiology. No seizures or definite epileptiform discharges were seen throughout the recording -maintaining airway,  remain n.p.o., meds and nutrition for tube feeds   Small acute infarcts in the left pons and left corona radiata  Etiology:  small vessel disease Seen by neurology, currently receiving aspirin 81 mg, Lipitor 80 mg per tube  Hyponatremia Urine sodium/osm/uric acid suggest SIADH, free water restriction, continue home meds salt tabs, continue protein supplement, SSRI stopped Appears slowly improving   Alcohol abuse Received phenobarbital in the ICU Not on CIWA protocol     Nutritional Assessment: The patient's BMI is: Body mass index is 26.52 kg/m.Marland Kitchen Seen by dietician.  I agree with the assessment and plan as outlined below: Nutrition Status: Nutrition Problem: Moderate Malnutrition Etiology: social / environmental circumstances (EtOH abuse) Signs/Symptoms: moderate fat depletion, severe muscle depletion Interventions: Tube feeding, MVI  .   I have Reviewed nursing notes, Vitals, pain scores, I/o's, Lab results and  imaging results since pt's last encounter, details please see discussion above  I ordered the following labs:  Unresulted Labs (From admission, onward)     Start     Ordered   03/15/22 5009  Basic metabolic panel  Daily at 5am,   R     Question:  Specimen collection method  Answer:  Lab=Lab collect   03/14/22 1601   03/15/22 0500  Osmolality  Tomorrow morning,   R       Question:  Specimen collection method  Answer:  Lab=Lab collect   03/14/22 1601             DVT prophylaxis: SCDs Start: 03/09/22 1629   Code Status:   Code  Status: Full Code  Family Communication: called HCPOA and left a message on 9/30 Disposition:    Dispo: The patient is from: home              Anticipated d/c is to: TBD              Anticipated d/c date is: Not ready to discharge, remain confused, strict n.p.o., receiving tube feeds  Antimicrobials:   Anti-infectives (From admission, onward)    Start     Dose/Rate Route Frequency Ordered Stop   03/10/22 1145  cefTRIAXone (ROCEPHIN) 1 g in sodium chloride 0.9 % 100 mL IVPB        1 g 200 mL/hr over 30 Minutes Intravenous Every 24 hours 03/10/22 1049 03/12/22 1424           Objective: Vitals:   03/14/22 2003 03/15/22 0446 03/15/22 0500 03/15/22 0753  BP: (!) 113/96 122/82  126/75  Pulse: 72 70  80  Resp: '18 17  15  '$ Temp: 98.7 F (37.1 C) 98.6 F (37 C)  97.7 F (36.5 C)  TempSrc: Oral Oral    SpO2: 97% 95%  97%  Weight:   76.8 kg     Intake/Output Summary (Last 24 hours) at 03/15/2022 0808 Last data filed at 03/15/2022 0759 Gross per 24 hour  Intake --  Output 675 ml  Net -675 ml   Filed Weights   03/13/22 0500 03/14/22 0500 03/15/22 0500  Weight: 80.8 kg 76.5 kg 76.8 kg    Examination:  General exam: Not as interactive as of yesterday,  no agitation, + small bore feeding tube in nare, moving extremities spontaneously Respiratory system: Clear to auscultation. Respiratory effort normal. Cardiovascular system:  RRR.  Gastrointestinal system: Abdomen is nondistended, soft and nontender.  Normal bowel sounds heard. Central nervous system: Alert , does not follow command Extremities:  no edema Skin: No rashes, lesions or ulcers Psychiatry: No agitation    Data Reviewed: I have personally reviewed  labs and visualized  imaging studies since the last encounter and formulate the plan        Scheduled Meds:  aspirin  81 mg Per Tube Daily   atorvastatin  80 mg Per Tube Daily   Chlorhexidine Gluconate Cloth  6 each Topical Daily   cholecalciferol  1,000  Units Per Tube Daily   feeding supplement (PROSource TF20)  60 mL Per Tube Daily   fluticasone  1 spray Each Nare Daily   folic acid  1 mg Per Tube Daily   gabapentin  150 mg Per Tube QHS   levETIRAcetam  1,000 mg Per Tube BID   multivitamin with minerals  1 tablet Per Tube Daily   nicotine  21 mg Transdermal Daily   mouth rinse  15 mL Mouth Rinse 4 times per day   pantoprazole  40 mg Per Tube Daily   sodium chloride  1 g Per Tube TID   thiamine  100 mg Per Tube Daily   vitamin B-12  100 mcg Per Tube Daily   Continuous Infusions:  feeding supplement (VITAL 1.5 CAL) 1,000 mL (03/15/22 0520)     LOS: 6 days     Florencia Reasons, MD PhD  FACP [facility]  Available via Epic secure chat 7am-7pm for nonurgent issues Please page for urgent issues To page the attending provider between 7A-7P or the covering provider during after hours 7P-7A, please log into the web site [email or web address] and access using universal [place] password for that web site. If you do not have the password, please call the hospital operator.    03/15/2022, 8:08 AM

## 2022-03-16 DIAGNOSIS — G9341 Metabolic encephalopathy: Secondary | ICD-10-CM | POA: Diagnosis not present

## 2022-03-16 LAB — BASIC METABOLIC PANEL
Anion gap: 8 (ref 5–15)
BUN: 21 mg/dL (ref 8–23)
CO2: 26 mmol/L (ref 22–32)
Calcium: 9 mg/dL (ref 8.9–10.3)
Chloride: 95 mmol/L — ABNORMAL LOW (ref 98–111)
Creatinine, Ser: 0.65 mg/dL (ref 0.61–1.24)
GFR, Estimated: >60 mL/min (ref >60)
Glucose, Bld: 120 mg/dL — ABNORMAL HIGH (ref 70–99)
Potassium: 4.3 mmol/L (ref 3.5–5.1)
Sodium: 129 mmol/L — ABNORMAL LOW (ref 135–145)

## 2022-03-16 LAB — GLUCOSE, CAPILLARY
Glucose-Capillary: 113 mg/dL — ABNORMAL HIGH (ref 70–99)
Glucose-Capillary: 119 mg/dL — ABNORMAL HIGH (ref 70–99)
Glucose-Capillary: 121 mg/dL — ABNORMAL HIGH (ref 70–99)
Glucose-Capillary: 130 mg/dL — ABNORMAL HIGH (ref 70–99)
Glucose-Capillary: 146 mg/dL — ABNORMAL HIGH (ref 70–99)
Glucose-Capillary: 156 mg/dL — ABNORMAL HIGH (ref 70–99)

## 2022-03-16 NOTE — Progress Notes (Signed)
PROGRESS NOTE    Carlos Wise  EXH:[id number] DOB: [birth year] DOA: 03/09/2022 PCP: [name], MD     Brief Narrative:   hypertension, hyperlipidemia, prostate cancer s/p radiation, seizure disorder, chronic hyponatremia, alcohol abuse (reportedly drinks 8 oz of gin daily), tobacco use 1 pack/day smoker, right ICA aneurysm s/p coiling, and recent left corona radiata acute lacunar infarction on 9/16 after being evaluated for altered mental status , Carlos Wise caregiver heard a bang while cleaning downstairs and went to find Carlos Wise had fallen after getting out of bed. He was noted to be making an abnormal noise and had liquid foaming at the mouth for 1 to 2 minutes before resolution.  Per triage RN " Pt was showering when he had an unwitnessed fall" He is aphasic on presentation,  Found to have small subdural bleed.  NSGY evaluated, no further workup needed, he is admitted to ICU for airway watch and high risk EtOH w/d, treated with phenobarb in the ICU for alcohol withdrawal He is now off phenobarb, maintaining airway, his transfer out of ICU on 9/28  Subjective:  He is much better, this is the first day he can carry a conversation, the first day he is aaox3, recall 2/3, he told me his HCPOA is [name] He denies pain, he is on room air, denies sob, he states he wants to eat, currently has cortack in nare with continuous tube feed   He asked "how is my sodium this morning"     Assessment & Plan:  Principal Problem:   Acute metabolic encephalopathy Active Problems:   Encephalopathy acute   SDH (subdural hematoma) (HCC)   Dysphagia   Moderate protein malnutrition ([place])   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  Additionally there is also cortical dysfunction in right temporal region likely secondary to underlying structural abnormality.  Lastly there is moderate diffuse encephalopathy, nonspecific etiology. No seizures or definite epileptiform discharges were seen throughout the recording -seen by neurology, On keppra, continue seizure precaution -much improved on 10/2, will repeat swallow eval   Small acute infarcts in the left pons and left corona radiata  Etiology:  small vessel disease Seen by neurology, currently receiving aspirin 81 mg, Lipitor 80 mg per tube  Hyponatremia Urine sodium/osm/uric acid suggest SIADH, free water restriction, continue home meds salt tabs, continue protein supplement, SSRI stopped Appears slowly improving   Alcohol abuse Received phenobarbital in the ICU Out of window of severe withdrawal   FTT; Pt recommend SNF placement   Nutritional Assessment: The patient's BMI is: Body mass index is 26.97 kg/m.Marland Kitchen Seen by dietician.  I agree with the assessment and plan as outlined below: Nutrition Status: Nutrition Problem: Moderate Malnutrition Etiology: social / environmental circumstances (EtOH abuse) Signs/Symptoms: moderate fat depletion, severe muscle depletion Interventions: Tube feeding, MVI  .   I have Reviewed nursing notes, Vitals, pain scores, I/o's, Lab results and  imaging results since pt's last encounter, details please see discussion above  I ordered the following labs:  Unresulted Labs (From admission, onward)     Start     Ordered   03/15/22 3810  Basic metabolic panel  Daily at 5am,  R     Question:  Specimen collection method  Answer:  Lab=Lab collect   03/14/22 1601             DVT prophylaxis: SCDs Start: 03/09/22 1629   Code Status:   Code Status: Full  Code  Family Communication: called HCPOA and left a message on 9/30 Disposition:    Dispo: The patient is from: home              Anticipated d/c is to: TBD              Anticipated d/c date is: Not ready to discharge, remain confused, strict n.p.o., receiving tube feeds  Antimicrobials:   Anti-infectives (From admission, onward)    Start     Dose/Rate Route Frequency Ordered Stop   03/10/22 1145  cefTRIAXone (ROCEPHIN) 1 g in sodium chloride 0.9 % 100 mL IVPB        1 g 200 mL/hr over 30 Minutes Intravenous Every 24 hours 03/10/22 1049 03/12/22 1424           Objective: Vitals:   03/16/22 0512 03/16/22 0541 03/16/22 0828 03/16/22 1812  BP:  134/77 119/70 123/83  Pulse:  73 72 90  Resp:  '18 16 18  '$ Temp:  (!) 97.4 F (36.3 C) 97.6 F (36.4 C) 98.3 F (36.8 C)  TempSrc:  Oral Oral Oral  SpO2:  98% 97% 96%  Weight: 78 kg 78.1 kg      Intake/Output Summary (Last 24 hours) at 03/16/2022 1857 Last data filed at 03/16/2022 1638 Gross per 24 hour  Intake 6444.83 ml  Output 500 ml  Net 5944.83 ml   Filed Weights   03/15/22 0500 03/16/22 0512 03/16/22 0541  Weight: 76.8 kg 78 kg 78.1 kg    Examination:  General exam: much more alert and more interactive, no agitation, + small bore feeding tube in nare,  Respiratory system: Clear to auscultation. Respiratory effort normal. Cardiovascular system:  RRR.  Gastrointestinal system: Abdomen is nondistended, soft and nontender.  Normal bowel sounds heard. Central nervous system: Alert , aaox3 today,  following command Extremities:  no edema Skin: No rashes, lesions or ulcers Psychiatry: No agitation    Data Reviewed: I have personally reviewed  labs and visualized  imaging studies since the last encounter and formulate the plan        Scheduled Meds:  aspirin  81 mg Per Tube Daily   atorvastatin  80 mg Per Tube Daily   cholecalciferol  1,000 Units Per Tube Daily   feeding supplement (PROSource TF20)  60 mL Per  Tube Daily   fluticasone  1 spray Each Nare Daily   folic acid  1 mg Per Tube Daily   gabapentin  150 mg Per Tube QHS   levETIRAcetam  1,000 mg Per Tube BID   multivitamin with minerals  1 tablet Per Tube Daily   nicotine  21 mg Transdermal Daily   mouth rinse  15 mL Mouth Rinse 4 times per day   pantoprazole  40 mg Per Tube Daily   sodium chloride  1 g Per Tube TID   thiamine  100 mg Per Tube Daily   vitamin B-12  100 mcg Per Tube Daily   Continuous Infusions:  feeding supplement (VITAL 1.5 CAL) 1,000 mL (03/16/22 1855)     LOS: 7 days     Florencia Reasons, MD PhD FACP [facility]  Available via Epic secure chat 7am-7pm for nonurgent issues Please page for  urgent issues To page the attending provider between 7A-7P or the covering provider during after hours 7P-7A, please log into the web site [email or web address] and access using universal [place] password for that web site. If you do not have the password, please call the hospital operator.    03/16/2022, 6:57 PM

## 2022-03-16 NOTE — Progress Notes (Addendum)
Speech Language Pathology Treatment: Dysphagia  Patient Details Name: Carlos Wise MRN: [id number] DOB: 1940/06/21 Today's Date: 03/16/2022 Time: 4536-4680 SLP Time Calculation (min) (ACUTE ONLY): 12 min  Assessment / Plan / Recommendation Clinical Impression  Carlos Wise shows significant improvements in overall mentation, allowing for more PO trials. Although cognitively he appears to be more appropriate for a PO diet, he does continue to demonstrate s/s of dysphagia and possible aspiration that seem to be acutely different than results from recent swallow study. Throat clearing is noted at times with solid foods (cracker, not purees) and thin liquids, but he also has coughing associated with thin liquid intake. Recommend proceeding with MBS to better evaluate for any possible changes in oropharyngeal function. Will be completed as can be scheduled with radiology. In the mean time, can continue with ice chips from staff after oral care. Could consider meds crushed in puree if necessary, although Carlos Wise does have Cortrak for now.   HPI HPI: Carlos Wise is an 81 yo male presenting after unwitnessed fall in the shower. CT Head showed 25m SDH along the L frontal convexity. MRI revealed likely acute CVA L pons and L corona radiata. MBS 01/08/22 with some oral holding and c/o globus sensation but with otherwise functional oropharyngeal swallow. speech/languag eveal 02/05/20 with cognitive impairment and severe aphasia with expressive/receptive deficits (wernicke's-like appearance) as well as possible motor planning impairment. PMH includes: esophageal dysphagia and stricture, [facility], R ICA aneurysm s/p coiling, seizure disorder, smoker, chronic daily alcohol user, prostate ca s/p XRT, HTN, chronic hyponatremia      SLP Plan  MBS      Recommendations for follow up therapy are one component of a multi-disciplinary discharge planning process, led by the attending physician.  Recommendations may  be updated based on patient status, additional functional criteria and insurance authorization.    Recommendations  Diet recommendations: NPO Medication Administration: Via alternative means                Oral Care Recommendations: Oral care prior to ice chip/H20;Oral care QID Follow Up Recommendations: Skilled nursing-short term rehab (<3 hours/day) Assistance recommended at discharge: Frequent or constant Supervision/Assistance SLP Visit Diagnosis: Dysphagia, unspecified (R13.10) Plan: MBS           LOsie Bond, M.A. CLeisure LakeOffice ([phone] Secure chat preferred   03/16/2022, 11:59 AM

## 2022-03-16 NOTE — Plan of Care (Signed)
  Problem: Nutrition: Goal: Adequate nutrition will be maintained Outcome: Not Progressing   Problem: Coping: Goal: Level of anxiety will decrease Outcome: Not Progressing   Problem: Elimination: Goal: Will not experience complications related to bowel motility Outcome: Not Progressing   Problem: Skin Integrity: Goal: Risk for impaired skin integrity will decrease Outcome: Not Progressing

## 2022-03-16 NOTE — Progress Notes (Signed)
Physical Therapy Treatment Patient Details Name: Carlos Wise MRN: [id number] DOB: [birth year] Today's Date: 03/16/2022   History of Present Illness Patient is a 81 year old male with a  history of prostate cancer status, hypertension, esophageal dysphagia and stricture, right ICA aneurysm status post coiling, seizure disorder, chronic hyponatremia, smoker, chronic daily alcohol user, and recent admission with left lacunar stroke, )who presented to the emergency department for evaluation status post unwitnessed fall with head strike. MRI revealed likely acute CVA L pons. Multifactorial acute encephalopathy. also noted to have a R scalp hematoma along with trace L convexity SAH, likely traumatic from fall. SAH is stable on repeat imaging.    PT Comments    The patient is agreeable to PT. Patient is much more alert today and able to follow commands more consistently today compared to on the evaluation. The patient required maximal assistance for bed mobility and maximal assistance for stand pivot transfer to the recliner chair. Sitting balance also improved since prior visit. Activity tolerance limited by fatigue. Recommend to continue PT to maximize independence and facilitate return to prior level of function. SNF still recommended at this time.    Recommendations for follow up therapy are one component of a multi-disciplinary discharge planning process, led by the attending physician.  Recommendations may be updated based on patient status, additional functional criteria and insurance authorization.  Follow Up Recommendations  Skilled nursing-short term rehab (<3 hours/day) Can patient physically be transported by private vehicle: No   Assistance Recommended at Discharge Frequent or constant Supervision/Assistance  Patient can return home with the following Two people to help with walking and/or transfers;A lot of help with bathing/dressing/bathroom;Assistance with feeding;Direct  supervision/assist for medications management;Direct supervision/assist for financial management;Assist for transportation;Help with stairs or ramp for entrance   Equipment Recommendations  None recommended by PT    Recommendations for Other Services OT consult     Precautions / Restrictions Precautions Precautions: Fall Restrictions Weight Bearing Restrictions: No     Mobility  Bed Mobility Overal bed mobility: Needs Assistance Bed Mobility: Supine to Sit     Supine to sit: Max assist     General bed mobility comments: assistance for trunk and BLE support provided. cues for sequencing and task initiation. increased time and effort required    Transfers Overall transfer level: Needs assistance Equipment used: None Transfers: Bed to chair/wheelchair/BSC   Stand pivot transfers: Max assist         General transfer comment: faciliation for anterior weight shifting. maximal cues for technique. unable to take steps, pivot transfer performed from bed to chair    Ambulation/Gait               General Gait Details: unsafe to attempt at this time, decreased activity tolerance   Stairs             Wheelchair Mobility    Modified Rankin (Stroke Patients Only)       Balance   Sitting-balance support: Feet supported Sitting balance-Leahy Scale: Poor Sitting balance - Comments: periods of Min A required to maintain sitting balance                                    Cognition Arousal/Alertness: Awake/alert Behavior During Therapy: WFL for tasks assessed/performed Overall Cognitive Status: History of cognitive impairments - at baseline  Orientation Level: Disoriented to, Situation, Time     Following Commands: Follows one step commands with increased time (and occasional repetition)     Problem Solving: Slow processing, Decreased initiation, Difficulty sequencing, Requires verbal cues, Requires tactile  cues General Comments: patient is hard of hearing        Exercises      General Comments        Pertinent Vitals/Pain Pain Assessment Pain Assessment: No/denies pain    Home Living                          Prior Function            PT Goals (current goals can now be found in the care plan section) Acute Rehab PT Goals Patient Stated Goal: to get out of bed PT Goal Formulation: With patient Time For Goal Achievement: 03/20/22 Potential to Achieve Goals: Fair Progress towards PT goals: Progressing toward goals    Frequency    Min 3X/week      PT Plan Current plan remains appropriate    Co-evaluation              AM-PAC PT "6 Clicks" Mobility   Outcome Measure  Help needed turning from your back to your side while in a flat bed without using bedrails?: A Lot Help needed moving from lying on your back to sitting on the side of a flat bed without using bedrails?: A Lot Help needed moving to and from a bed to a chair (including a wheelchair)?: A Lot Help needed standing up from a chair using your arms (e.g., wheelchair or bedside chair)?: A Lot Help needed to walk in hospital room?: Total Help needed climbing 3-5 steps with a railing? : Total 6 Click Score: 10    End of Session   Activity Tolerance: Patient tolerated treatment well Patient left: in chair;with call bell/phone within reach;with chair alarm set Nurse Communication: Mobility status PT Visit Diagnosis: Unsteadiness on feet (R26.81);Muscle weakness (generalized) (M62.81)     Time: 7622-6333 PT Time Calculation (min) (ACUTE ONLY): 21 min  Charges:  $Therapeutic Activity: 8-22 mins                    Minna Merritts, PT, MPT    Percell Locus 03/16/2022, 2:17 PM

## 2022-03-17 ENCOUNTER — Inpatient Hospital Stay (HOSPITAL_COMMUNITY): Payer: Medicare HMO

## 2022-03-17 DIAGNOSIS — G9341 Metabolic encephalopathy: Secondary | ICD-10-CM | POA: Diagnosis not present

## 2022-03-17 LAB — GLUCOSE, CAPILLARY
Glucose-Capillary: 104 mg/dL — ABNORMAL HIGH (ref 70–99)
Glucose-Capillary: 110 mg/dL — ABNORMAL HIGH (ref 70–99)
Glucose-Capillary: 118 mg/dL — ABNORMAL HIGH (ref 70–99)
Glucose-Capillary: 130 mg/dL — ABNORMAL HIGH (ref 70–99)
Glucose-Capillary: 134 mg/dL — ABNORMAL HIGH (ref 70–99)
Glucose-Capillary: 146 mg/dL — ABNORMAL HIGH (ref 70–99)
Glucose-Capillary: 158 mg/dL — ABNORMAL HIGH (ref 70–99)

## 2022-03-17 LAB — BASIC METABOLIC PANEL
Anion gap: 9 (ref 5–15)
BUN: 21 mg/dL (ref 8–23)
CO2: 26 mmol/L (ref 22–32)
Calcium: 9.1 mg/dL (ref 8.9–10.3)
Chloride: 96 mmol/L — ABNORMAL LOW (ref 98–111)
Creatinine, Ser: 0.71 mg/dL (ref 0.61–1.24)
GFR, Estimated: >60 mL/min (ref >60)
Glucose, Bld: 125 mg/dL — ABNORMAL HIGH (ref 70–99)
Potassium: 4.5 mmol/L (ref 3.5–5.1)
Sodium: 131 mmol/L — ABNORMAL LOW (ref 135–145)

## 2022-03-17 MED ORDER — ACETAMINOPHEN 325 MG PO TABS: 650.0000 mg | ORAL_TABLET | Freq: Four times a day (QID) | ORAL | Status: DC | PRN

## 2022-03-17 MED ORDER — METOPROLOL TARTRATE 5 MG/5ML IV SOLN: 5.0000 mg | INTRAVENOUS | Status: DC | PRN

## 2022-03-17 MED ORDER — IPRATROPIUM-ALBUTEROL 0.5-2.5 (3) MG/3ML IN SOLN: 3.0000 mL | RESPIRATORY_TRACT | Status: DC | PRN

## 2022-03-17 MED ORDER — SENNOSIDES-DOCUSATE SODIUM 8.6-50 MG PO TABS: 1.0000 | ORAL_TABLET | Freq: Every evening | ORAL | Status: DC | PRN

## 2022-03-17 MED ORDER — GUAIFENESIN 100 MG/5ML PO LIQD
5.0000 mL | ORAL | Status: DC | PRN
  Administered 2022-03-19 – 2022-03-20 (×2): 5 mL via ORAL
  Filled 2022-03-17 (×2): qty 5

## 2022-03-17 MED ORDER — HYDRALAZINE HCL 20 MG/ML IJ SOLN: 10.0000 mg | INTRAMUSCULAR | Status: DC | PRN

## 2022-03-17 NOTE — Progress Notes (Signed)
Modified Barium Swallow Progress Note  Patient Details  Name: Carlos Wise MRN: [id number] Date of Birth: Jul 25, 1940  Today's Date: 03/17/2022  Modified Barium Swallow completed.  Full report located under Chart Review in the Imaging Section.  Brief recommendations include the following:  Clinical Impression  Pt presents with a mild, primarily oral dysphagia that appears to be fairly consistent with baseline swallowing function. He has oral holding particularly with thin liquids, which is likely cognitive and/or behavioral in nature. He also has residue in his anterior sulcus, seemingly new compared to prior MBS, but mild in nature and cleared with verbal cue to swallow a second time. Pt otherwise has slow lingual propulsion with solids but his pharyngeal function appears to be [facility]. This does include a single episode of trace, transient penetration (PAS 2), felt to be related to mistiming. No aspiration occurs with any barium consistencies, although note that despite no visible barium in his larynx, he does have a wet vocal quality and delayed coughing at the end of the study, which may be more suggestive of his secretion management. Recommen starting with Dys 3 diet and thin liquids but with full supervision during meals due to cognition.   Swallow Evaluation Recommendations       SLP Diet Recommendations: Dysphagia 3 (Mech soft) solids;Thin liquid   Liquid Administration via: Straw;Cup   Medication Administration: Crushed with puree   Supervision: Staff to assist with self feeding;Full supervision/cueing for compensatory strategies   Compensations: Minimize environmental distractions;Slow rate;Small sips/bites   Postural Changes: Seated upright at 90 degrees;Remain semi-upright after after feeds/meals (Comment)   Oral Care Recommendations: Oral care BID        Osie Bond., M.A. [place] Office [phone]  Secure chat preferred  03/17/2022,3:21  PM

## 2022-03-17 NOTE — NC FL2 (Signed)
[place] LEVEL OF CARE SCREENING TOOL     IDENTIFICATION  Patient Name: Carlos Wise Birthdate: 1940-12-21 Sex: male Admission Date (Current Location): 03/09/2022  [facility] and [place] Number:  Herbalist and Address:  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)    Encephalopathy acute    SDH (subdural hematoma) (HCC)    Dysphagia    Moderate protein malnutrition ([place])    Seizure disorder ([place]) 02/28/2022   Tobacco use 02/28/2022   Depression 47/02/6282   Acute metabolic encephalopathy 66/29/4765   HTN (hypertension) 11/22/2021   HLD (hyperlipidemia) 11/22/2021   (HFpEF) heart failure with preserved ejection fraction ([place]) 11/22/2021   Altered mental status 02/07/2020   Aphasia 02/04/2020   Leukocytosis 02/04/2020   Acute on chronic hyponatremia 02/04/2020   Alcohol use 02/04/2020   Malignant neoplasm of prostate ([place]) 07/24/2014    Orientation RESPIRATION BLADDER Height & Weight     Self, Place  Normal Incontinent, External catheter Weight: 170 lb 3.1 oz (77.2 kg) Height:     BEHAVIORAL SYMPTOMS/MOOD NEUROLOGICAL BOWEL NUTRITION STATUS      Incontinent Diet (See DC Summary)  AMBULATORY STATUS COMMUNICATION OF NEEDS Skin   Extensive Assist Verbally Normal                       Personal Care Assistance Level of Assistance  Bathing, Feeding, Dressing Bathing Assistance: Maximum  assistance Feeding assistance: Independent Dressing Assistance: Maximum assistance     Functional Limitations Info  Sight, Hearing, Speech Sight Info: Adequate Hearing Info: Impaired Speech Info: Adequate    SPECIAL CARE FACTORS FREQUENCY  PT (By licensed PT), OT (By licensed OT)     PT Frequency: 5x a week OT Frequency: 5x a week            Contractures Contractures Info: Not present    Additional Factors Info  Code Status, Allergies Code Status Info: Full Allergies Info: Penicillins           Current Medications (03/17/2022):  This is the current hospital active medication list Current Facility-Administered Medications  Medication Dose Route Frequency Provider Last Rate Last Admin   acetaminophen (TYLENOL) tablet 650 mg  650 mg Oral Q6H PRN Amin, Ankit Chirag, MD       aspirin chewable tablet 81 mg  81 mg Per Tube Daily Julian Hy, DO   81 mg at 03/17/22 0909   atorvastatin (LIPITOR) tablet 80 mg  80 mg Per Tube Daily Donnetta Simpers, MD   80 mg at 03/17/22 0909   cholecalciferol (VITAMIN D3) 25 MCG (1000 UNIT) tablet 1,000 Units  1,000 Units Per Tube Daily Noemi Chapel P, DO   1,000 Units at 03/17/22 0909   docusate (COLACE) 50 MG/5ML liquid 100 mg  100 mg Per Tube BID PRN Noemi Chapel P, DO       feeding supplement (PROSource TF20) liquid 60 mL  60 mL Per Tube Daily Julian Hy, DO   60 mL at 03/17/22 0954   feeding supplement (VITAL 1.5 CAL) liquid 1,000 mL  1,000 mL Per Tube Continuous Julian Hy, DO 55 mL/hr at 03/17/22 0504 Infusion Verify at 03/17/22 0504   fluticasone (FLONASE) 50 MCG/ACT nasal spray 1 spray  1 spray Each Nare Daily Candee Furbish, MD   1 spray at 00/93/81 8299   folic acid (FOLVITE) tablet 1 mg  1 mg Per Tube Daily Noemi Chapel P, DO   1 mg at 03/17/22 3716   gabapentin (NEURONTIN) 250 MG/5ML solution 150 mg  150 mg Per Tube QHS Noemi Chapel P, DO   150 mg at 03/16/22 2025   guaiFENesin (ROBITUSSIN) 100 MG/5ML liquid 5 mL  5 mL  Oral Q4H PRN Amin, Ankit Chirag, MD       hydrALAZINE (APRESOLINE) injection 10 mg  10 mg Intravenous Q4H PRN Amin, Ankit Chirag, MD       ipratropium-albuterol (DUONEB) 0.5-2.5 (3) MG/3ML nebulizer solution 3 mL  3 mL Nebulization Q4H PRN Amin, Ankit Chirag, MD       ketotifen (ZADITOR) 0.025 % ophthalmic solution 1 drop  1 drop Both Eyes Daily PRN Candee Furbish, MD       levETIRAcetam (KEPPRA) 100 MG/ML solution 1,000 mg  1,000 mg Per Tube BID Noemi Chapel P, DO   1,000 mg at 03/17/22 0908   metoprolol tartrate (LOPRESSOR) injection 5 mg  5 mg Intravenous Q4H PRN Amin, Jeanella Flattery, MD       multivitamin with minerals tablet 1 tablet  1 tablet Per Tube Daily Julian Hy, DO   1 tablet at 03/17/22 0909   nicotine (NICODERM CQ - dosed in mg/24 hours) patch 21 mg  21 mg Transdermal Daily Candee Furbish, MD   21 mg at 03/17/22 0910   ondansetron (ZOFRAN) injection 4 mg  4 mg Intravenous Q6H PRN Candee Furbish, MD       Oral care mouth rinse  15 mL Mouth Rinse 4 times per day Noemi Chapel P, DO   15 mL at 03/17/22 0910   Oral care mouth rinse  15 mL Mouth Rinse PRN Noemi Chapel P, DO   15 mL at 03/11/22 0752   pantoprazole (PROTONIX) 2 mg/mL oral suspension 40 mg  40 mg Per Tube Daily Noemi Chapel P, DO   40 mg at 03/17/22 0908   polyethylene glycol (MIRALAX / GLYCOLAX) packet 17 g  17 g Per Tube Daily PRN Noemi Chapel P, DO       senna-docusate (Senokot-S) tablet 1 tablet  1 tablet Oral QHS PRN Amin, Ankit Chirag, MD       sodium chloride (OCEAN) 0.65 % nasal spray 1 spray  1 spray Each Nare Daily PRN Candee Furbish, MD       sodium chloride tablet 1 g  1 g Per Tube TID Julian Hy, DO   1 g at 03/17/22 9678   thiamine (VITAMIN B1) tablet 100 mg  100 mg Per Tube Daily Noemi Chapel P, DO   100 mg at 03/17/22 9381   vitamin B-12 (CYANOCOBALAMIN) tablet 100 mcg  100 mcg Per Tube Daily Noemi Chapel P, DO   100 mcg at 03/17/22 0909     Discharge Medications: Please see discharge summary for  a list of discharge medications.  Relevant Imaging Results:  Relevant Lab Results:   Additional Information SS#: [id number]  Reece Agar, LCSWA

## 2022-03-17 NOTE — Plan of Care (Signed)
  Problem: Nutrition: Goal: Adequate nutrition will be maintained Outcome: Not Progressing   Problem: Coping: Goal: Level of anxiety will decrease Outcome: Not Progressing   Problem: Safety: Goal: Ability to remain free from injury will improve Outcome: Not Progressing

## 2022-03-17 NOTE — Progress Notes (Signed)
PROGRESS NOTE    Carlos Wise  SWF:[id number] DOB: 1940-07-14 DOA: 03/09/2022 PCP: [name], MD   Brief Narrative:  81 year old with history of hypertension, hyperlipidemia, prostate cancer s/p radiation, seizure disorder, chronic hyponatremia, alcohol abuse (reportedly drinks 8 oz of gin daily), tobacco use 1 pack/day smoker, right ICA aneurysm s/p coiling, and recent left corona radiata acute lacunar infarction on 9/16 after being evaluated for altered mental status , Carlos Wise caregiver heard a bang while cleaning downstairs and went to find Carlos Wise had fallen after getting out of bed. He was noted to be making an abnormal noise and had liquid foaming at the mouth for 1 to 2 minutes before resolution.  Per triage RN " Pt was showering when he had an unwitnessed fall" He is aphasic on presentation,  Found to have small subdural bleed.  NSGY evaluated, no further workup needed, he is admitted to ICU for airway watch and high risk EtOH w/d, treated with phenobarb in the ICU for alcohol withdrawal. 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  Additionally there is also cortical dysfunction in right temporal region likely secondary to underlying structural abnormality.  Lastly there  is moderate diffuse encephalopathy, nonspecific etiology. No seizures or definite epileptiform discharges were seen throughout the recording -seen by neurology, On keppra, continue seizure precaution -s/p Repeat SS eval 10/3= on D1D    Small acute infarcts in the left pons and left corona radiata  -Likely due to small vessel disease.  MRI confirmed CVA.  Echocardiogram EF 60%, LDL 161, A1c 5.1.  Current recommendations daily aspirin and Lipitor 80 mg   Hyponatremia; improved.  Urine sodium/osm/uric acid suggest SIADH, free water restriction, continue home meds salt tabs, continue protein supplement, SSRI stopped Appears slowly improving    Alcohol abuse Off phenobarbital.  No longer showing signs of withdrawal.  Continue multivitamin, folic acid and thiamine   Adult failure to thrive - Encourage oral intake when able   PT/OT-SNF   DVT prophylaxis: SCDs Start: 03/09/22 1629 Code Status: Full Family Communication:  Anise Salvo.   Advancing po intake slowly. Therefore after snf placement.    Nutritional status    Signs/Symptoms: moderate fat depletion, severe muscle depletion  Interventions: Tube feeding, MVI  Body mass index is 26.66 kg/m.      Subjective:  Minimal interaction, he is sleeping well.   Examination:  Constitutional: Not in acute distress; small bore feeding tube.  Respiratory: Clear to auscultation bilaterally Cardiovascular: Normal sinus rhythm, no rubs Abdomen: Nontender nondistended good bowel sounds Musculoskeletal: No edema noted Skin: No rashes seen Neurologic: CN 2-12 grossly intact.  And nonfocal Psychiatric: poorjudgment and insight. Alert and oriented x 3.      Objective: Vitals:   03/16/22 1956 03/17/22 0500 03/17/22 0522 03/17/22 0739  BP: 133/77  110/65 106/60  Pulse: 86  69 68  Resp: 18  18 16  Temp: 98.1 F (36.7 C)  (!) 97.5 F (36.4 C) 97.9 F (36.6 C)  TempSrc: Oral  Oral Oral  SpO2: 96%  99% 98%  Weight:  77.2  kg      Intake/Output Summary (Last 24 hours) at 03/17/2022 0840 Last data filed at 03/17/2022 0700 Gross per 24 hour  Intake 1232.91 ml  Output 250 ml  Net 982.91 ml   Filed Weights   03/16/22 0512 03/16/22 0541 03/17/22 0500  Weight: 78 kg 78.1 kg 77.2 kg     Data Reviewed:   CBC: No results for input(s): "WBC", "NEUTROABS", "HGB", "HCT", "MCV", "PLT" in the last 168 hours. Basic Metabolic Panel: Recent Labs  Lab 03/11/22 0754 03/12/22 0313 03/13/22 0228 03/14/22 0628 03/15/22 0507 03/16/22 0054 03/17/22 0240  NA 125* 128* 127* 128* 130* 129* 131*  K 3.8 3.9 3.9 4.1 4.2 4.3 4.5  CL 95* 92* 91* 96* 95* 95* 96*  CO2 21* '25 25 23 24 26 26  '$ GLUCOSE 153* 125* 123* 111* 108* 120* 125*  BUN '13 16 14 16 19 21 21  '$ CREATININE 0.76 0.69 0.64 0.62 0.67 0.65 0.71  CALCIUM 8.7* 9.2 8.8* 9.1 9.2 9.0 9.1  MG 1.8 2.0 1.9 2.0  --   --   --   PHOS 2.6 2.4* 3.1 3.9  --   --   --    GFR: Estimated Creatinine Clearance: 68.9 mL/min (by C-G formula based on SCr of 0.71 mg/dL). Liver Function Tests: No results for input(s): "AST", "ALT", "ALKPHOS", "BILITOT", "PROT", "ALBUMIN" in the last 168 hours. No results for input(s): "LIPASE", "AMYLASE" in the last 168 hours. No results for input(s): "AMMONIA" in the last 168 hours. Coagulation Profile: No results for input(s): "INR", "PROTIME" in the last 168 hours. Cardiac Enzymes: No results for input(s): "CKTOTAL", "CKMB", "CKMBINDEX", "TROPONINI" in the last 168 hours. BNP (last 3 results) No results for input(s): "PROBNP" in the last 8760 hours. HbA1C: No results for input(s): "HGBA1C" in the last 72 hours. CBG: Recent Labs  Lab 03/16/22 1824 03/16/22 1958 03/16/22 2358 03/17/22 0548 03/17/22 0737  GLUCAP 119* 146* 146* 118* 134*   Lipid Profile: No results for input(s): "CHOL", "HDL", "LDLCALC", "TRIG", "CHOLHDL", "LDLDIRECT" in the last 72 hours. Thyroid Function Tests: No results for input(s): "TSH", "T4TOTAL", "FREET4",  "T3FREE", "THYROIDAB" in the last 72 hours. Anemia Panel: No results for input(s): "VITAMINB12", "FOLATE", "FERRITIN", "TIBC", "IRON", "RETICCTPCT" in the last 72 hours. Sepsis Labs: No results for input(s): "PROCALCITON", "LATICACIDVEN" in the last 168 hours.  Recent Results (from the past 240 hour(s))  MRSA Next Gen by PCR, Nasal     Status: None   Collection Time: 03/09/22  7:47 PM   Specimen: Nasal Mucosa; Nasal Swab  Result Value Ref Range Status   MRSA by PCR Next Gen NOT DETECTED NOT DETECTED Final    Comment: (NOTE) The GeneXpert MRSA Assay (FDA approved for NASAL specimens only), is one component of a comprehensive MRSA colonization surveillance program. It is not intended to diagnose MRSA infection nor to guide or monitor treatment for MRSA infections. Test performance is not FDA approved in patients less than 51 years old. Performed at [facility], [place] [address] [place], [place] [address]          Radiology Studies: No results found.      Scheduled Meds:  aspirin  81 mg Per Tube Daily   atorvastatin  80 mg Per Tube Daily   cholecalciferol  1,000 Units Per Tube Daily   feeding supplement (PROSource TF20)  60 mL Per Tube Daily   fluticasone  1 spray Each Nare Daily   folic acid  1 mg Per Tube Daily   gabapentin  150 mg Per Tube QHS   levETIRAcetam  1,000 mg Per Tube BID   multivitamin with minerals  1 tablet Per Tube Daily   nicotine  21 mg Transdermal Daily   mouth rinse  15 mL Mouth Rinse 4 times per day   pantoprazole  40 mg Per Tube Daily   sodium chloride  1 g Per Tube TID   thiamine  100 mg Per Tube Daily   vitamin B-12  100 mcg Per Tube Daily   Continuous Infusions:  feeding supplement (VITAL 1.5 CAL) 55 mL/hr at 03/17/22 0504     LOS: 8 days   Time spent= 35 mins    Elidia Bonenfant Arsenio Loader, MD [facility]  If 7PM-7AM, please contact night-coverage  03/17/2022, 8:40 AM

## 2022-03-18 DIAGNOSIS — G9341 Metabolic encephalopathy: Secondary | ICD-10-CM | POA: Diagnosis not present

## 2022-03-18 LAB — BASIC METABOLIC PANEL
Anion gap: 10 (ref 5–15)
BUN: 22 mg/dL (ref 8–23)
CO2: 26 mmol/L (ref 22–32)
Calcium: 8.9 mg/dL (ref 8.9–10.3)
Chloride: 96 mmol/L — ABNORMAL LOW (ref 98–111)
Creatinine, Ser: 0.69 mg/dL (ref 0.61–1.24)
GFR, Estimated: >60 mL/min (ref >60)
Glucose, Bld: 115 mg/dL — ABNORMAL HIGH (ref 70–99)
Potassium: 4.3 mmol/L (ref 3.5–5.1)
Sodium: 132 mmol/L — ABNORMAL LOW (ref 135–145)

## 2022-03-18 LAB — GLUCOSE, CAPILLARY
Glucose-Capillary: 114 mg/dL — ABNORMAL HIGH (ref 70–99)
Glucose-Capillary: 121 mg/dL — ABNORMAL HIGH (ref 70–99)
Glucose-Capillary: 138 mg/dL — ABNORMAL HIGH (ref 70–99)
Glucose-Capillary: 140 mg/dL — ABNORMAL HIGH (ref 70–99)
Glucose-Capillary: 94 mg/dL (ref 70–99)
Glucose-Capillary: 98 mg/dL (ref 70–99)

## 2022-03-18 LAB — CBC
HCT: 36.1 % — ABNORMAL LOW (ref 39.0–52.0)
Hemoglobin: 12.4 g/dL — ABNORMAL LOW (ref 13.0–17.0)
MCH: 32.2 pg (ref 26.0–34.0)
MCHC: 34.3 g/dL (ref 30.0–36.0)
MCV: 93.8 fL (ref 80.0–100.0)
Platelets: 271 10*3/uL (ref 150–400)
RBC: 3.85 MIL/uL — ABNORMAL LOW (ref 4.22–5.81)
RDW: 12 % (ref 11.5–15.5)
WBC: 8.8 10*3/uL (ref 4.0–10.5)
nRBC: 0 % (ref 0.0–0.2)

## 2022-03-18 LAB — MAGNESIUM: Magnesium: 2 mg/dL (ref 1.7–2.4)

## 2022-03-18 MED ORDER — VITAL 1.5 CAL PO LIQD
1000.0000 mL | ORAL | Status: DC
  Administered 2022-03-18 – 2022-03-21 (×4): 1000 mL
  Filled 2022-03-18 (×5): qty 1000

## 2022-03-18 NOTE — Progress Notes (Signed)
Nutrition Follow-up  DOCUMENTATION CODES:  Non-severe (moderate) malnutrition in context of social or environmental circumstances  INTERVENTION:  Continue current diet as ordered per SLP Encourage PO intake Ensure Enlive po BID, each supplement provides 350 kcal and 20 grams of protein. Adjust tube feeds via Cortrak to nocturnal x 12 hours (8pm-8am): Vital 1.5 @ 75 ml/hr (900 ml/day) PROSource TF20 60 ml daily. Tube feeding regimen at goal provides 1430 kcal, 81 grams of protein, and 688 ml of free water. Continue MVI with minerals daily per tube  NUTRITION DIAGNOSIS:  Moderate Malnutrition related to social / environmental circumstances (EtOH abuse) as evidenced by moderate fat depletion, severe muscle depletion. - remains applicable  GOAL:  Patient will meet greater than or equal to 90% of their needs - progressing, TF providing nutrition, diet advanced, supplements in place  MONITOR:  Diet advancement, Labs, Weight trends, TF tolerance  REASON FOR ASSESSMENT:  Consult Poor PO  ASSESSMENT:  81 year old male who presented to the ED on 9/25 after an unwitnessed fall. PMH of R ICA aneurysm s/p coiling, stroke, HLD, HTN, EtOH abuse, tobacco abuse, seizures, prostate cancer s/p radiation therapy, esophageal dysphagia and stricture. Pt admitted with acute encephalopathy in setting of seizure activity, SDH.  Pt resting in bed, conversant but not completely oriented. States he hasn't eaten in 2 weeks. Reminded pt about TF and that he has been on a diet since yesterday. RN reports that he is consuming ~50% of meals. Adjusted to nocturnal feeds to see if pt will meet needs orally.  Nutritionally Relevant Medications: Scheduled Meds:  atorvastatin  80 mg Per Tube Daily   cholecalciferol  1,000 Units Per Tube Daily   PROSource TF20  60 mL Per Tube Daily   VITAL 1.5 CAL  1,000 mL Per Tube N46E   folic acid  1 mg Per Tube Daily   multivitamin with minerals  1 tablet Per Tube Daily    pantoprazole  40 mg Per Tube Daily   sodium chloride  1 g Per Tube TID   thiamine  100 mg Per Tube Daily   vitamin B-12  100 mcg Per Tube Daily   PRN Meds: docusate, ondansetron, polyethylene glycol, senna-docusate  Labs Reviewed: Na 132, chloride 96 CBG ranges from 94-158 mg/dL over the last 24 hours  NUTRITION - FOCUSED PHYSICAL EXAM: Flowsheet Row Most Recent Value  Orbital Region Moderate depletion  Upper Arm Region Moderate depletion  Thoracic and Lumbar Region Moderate depletion  Buccal Region Mild depletion  Temple Region Moderate depletion  Clavicle Bone Region Moderate depletion  Clavicle and Acromion Bone Region Moderate depletion  Scapular Bone Region Moderate depletion  Dorsal Hand Mild depletion  Patellar Region Moderate depletion  Anterior Thigh Region Severe depletion  Posterior Calf Region Severe depletion  Edema (RD Assessment) None  Hair Reviewed  Eyes Reviewed  Mouth Reviewed  Skin Reviewed  Nails Reviewed    Diet Order:   Diet Order             DIET DYS 3 Room service appropriate? Yes with Assist; Fluid consistency: Thin  Diet effective now                   EDUCATION NEEDS:  Not appropriate for education at this time  Skin:  Skin Assessment: Reviewed RN Assessment  Last BM:  10/3 - type 6  Height:  Ht Readings from Last 1 Encounters:  02/28/22 '5\' 7"'$  (1.702 m)    Weight:  Wt Readings from Last 1  Encounters:  03/18/22 77.6 kg    Ideal Body Weight:  67.3 kg  BMI:  Body mass index is 26.79 kg/m.  Estimated Nutritional Needs:  Kcal:  1900-2100 Protein:  95-110 grams Fluid:  1.9-2.1 L    Ranell Patrick, RD, LDN Clinical Dietitian RD pager # available in [facility]  After hours/weekend pager # available in [facility]

## 2022-03-18 NOTE — Progress Notes (Signed)
Speech Language Pathology Treatment: Dysphagia  Patient Details Name: Carlos Wise MRN: [id number] DOB: 1940-10-17 Today's Date: 03/18/2022 Time: 4944-9675 SLP Time Calculation (min) (ACUTE ONLY): 25 min  Assessment / Plan / Recommendation Clinical Impression  RN with concerns with pt having difficulty consuming am meal which consisted of mechanical soft solids. He ate very few POs, but what he did consume he swallowed without concerns for aspiration per RN. She did state that he did well with pm meal on 10/3.  Pt seen for skilled dysphagia treatment with focus on diet tolerance and effectiveness/use of compensatory strategies. Pt reported that am meal was "cold and greasy" and not something he would eat at home. Question if this distaste for am meal items affected his overall intake/performance. With provided mechanical soft/mixed consistency solid and thin liquids, pt consumed demonstrating adequate mastication and oral clearance. He required no cueing to perform repeat swallows to clear min oral residuals. X2 overt coughing exhibited with thin liquids, although suspect no relation to POs given MBS (10/3) findings which revealed no aspiration of any consistency. It did note that pt "does have a wet vocal quality and delayed coughing at the end of the study, which may be more suggestive of his secretion management". Recommend continue dys 3 diet/thin liquids with 1:1 supervision for meals and adherence to swallow precautions. Will continue f/u for tolerance.    HPI HPI: Pt is an 81 yo male presenting after unwitnessed fall in the shower. CT Head showed 41m SDH along the L frontal convexity. MRI revealed likely acute CVA L pons and L corona radiata. MBS 01/08/22 with some oral holding and c/o globus sensation but with otherwise functional oropharyngeal swallow. speech/languag eveal 02/05/20 with cognitive impairment and severe aphasia with expressive/receptive deficits (wernicke's-like appearance) as well  as possible motor planning impairment. PMH includes: esophageal dysphagia and stricture[facility], R ICA aneurysm s/p coiling, seizure disorder, smoker, chronic daily alcohol user, prostate ca s/p XRT, HTN, chronic hyponatremia      SLP Plan  Continue with current plan of care      Recommendations for follow up therapy are one component of a multi-disciplinary discharge planning process, led by the attending physician.  Recommendations may be updated based on patient status, additional functional criteria and insurance authorization.    Recommendations  Diet recommendations: Dysphagia 3 (mechanical soft);Thin liquid Liquids provided via: Cup;Straw Medication Administration: Crushed with puree Supervision: Staff to assist with self feeding;Full supervision/cueing for compensatory strategies Compensations: Slow rate;Small sips/bites;Minimize environmental distractions (second swallow to remove any residuals in anterior sulcus) Postural Changes and/or Swallow Maneuvers: Seated upright 90 degrees                Oral Care Recommendations: Oral care BID Follow Up Recommendations: Skilled nursing-short term rehab (<3 hours/day) Assistance recommended at discharge: Frequent or constant Supervision/Assistance SLP Visit Diagnosis: Dysphagia, oral phase (R13.11) Plan: Continue with current plan of care             [place] Dense MW[place]ate CMidlandOffice Number: [phone] 03/18/2022, 12:18 PM

## 2022-03-18 NOTE — Progress Notes (Addendum)
PROGRESS NOTE    Carlos Wise  TOI:[id number] DOB: May 19, 1941 DOA: 03/09/2022 PCP: [name], MD   Brief Narrative:  81 year old with history of hypertension, hyperlipidemia, prostate cancer s/p radiation, seizure disorder, chronic hyponatremia, alcohol abuse (reportedly drinks 8 oz of gin daily), tobacco use 1 pack/day smoker, right ICA aneurysm s/p coiling, and recent left corona radiata acute lacunar infarction on 9/16 after being evaluated for altered mental status , Carlos Wise caregiver heard a bang while cleaning downstairs and went to find Carlos Wise had fallen after getting out of bed. He was noted to be making an abnormal noise and had liquid foaming at the mouth for 1 to 2 minutes before resolution.  Per triage RN " Pt was showering when he had an unwitnessed fall" He is aphasic on presentation,  Found to have small subdural bleed.  NSGY evaluated, no further workup needed, he is admitted to ICU for airway watch and high risk EtOH w/d, treated with phenobarb in the ICU for alcohol withdrawal. He is  off phenobarb, maintaining airway, his transfer out of ICU on 9/28.  Slowly working with speech therapy now advanced to D3 diet with full supervision.   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  Additionally there is also cortical dysfunction in right  temporal region likely secondary to underlying structural abnormality.  Lastly there is moderate diffuse encephalopathy, nonspecific etiology. No seizures or definite epileptiform discharges were seen throughout the recording -seen by neurology, On keppra, continue seizure precaution -s/p Repeat SS eval 10/3= D3 diet full supervision.      Small acute infarcts in the left pons and left corona radiata  -Likely due to small vessel disease.  MRI confirmed CVA.  Echocardiogram EF 60%, LDL 161, A1c 5.1.  On aspirin and Lipitor 80 mg   Hyponatremia; improved.  Resolved.     Alcohol abuse Off phenobarbital.  No longer showing signs of withdrawal.  Continue multivitamin, folic acid and thiamine   Adult failure to thrive - Tube feeds will be changed to nocturnal to allow more oral intake during the day   PT/OT-SNF   DVT prophylaxis: SCDs Start: 03/09/22 1629 Code Status: Full Family Communication:  Carlos Wise, updated periodically.   Currently awaiting SNF placement   Nutritional status    Signs/Symptoms: moderate fat depletion, severe muscle depletion  Interventions: Tube feeding, MVI  Body mass index is 26.79 kg/m.      Subjective: No complaints.  Feels tired.    Examination: Constitutional: Not in acute distress; feeding tube in place.  Respiratory: Clear to auscultation bilaterally Cardiovascular: Normal sinus rhythm, no rubs Abdomen: Nontender nondistended good bowel sounds Musculoskeletal: No edema noted Skin: No rashes seen Neurologic: CN 2-12 grossly intact.  And nonfocal Psychiatric: Poorjudgment and insight. Alert and oriented x 3. Normal mood.    Objective: Vitals:   03/17/22 1949 03/18/22 0423 03/18/22 0500 03/18/22 0737  BP: 115/75 122/79  123/68  Pulse: 81 72  73  Resp: '18 18  16  '$ Temp: 98.8 F (37.1 C) 97.6 F (36.4 C)  98.3 F (36.8 C)  TempSrc: Oral Oral  Oral  SpO2: 97% 95%  97%  Weight:   77.6 kg     Intake/Output Summary (Last 24  hours) at 03/18/2022 0808 Last data filed at 03/18/2022 0300 Gross per 24 hour  Intake 1350 ml  Output 300 ml  Net 1050 ml   Filed Weights   03/16/22 0541 03/17/22 0500 03/18/22 0500  Weight: 78.1 kg 77.2 kg 77.6 kg     Data Reviewed:   CBC: Recent Labs  Lab 03/18/22 0300  WBC 8.8  HGB 12.4*  HCT 36.1*  MCV 93.8  PLT 540   Basic Metabolic Panel: Recent Labs  Lab 03/12/22 0313 03/13/22 0228 03/14/22 0628 03/15/22 0507 03/16/22 0054 03/17/22 0240 03/18/22 0300  NA 128* 127* 128* 130* 129* 131* 132*  K 3.9 3.9 4.1 4.2 4.3 4.5 4.3  CL 92* 91* 96* 95* 95* 96* 96*  CO2 '25 25 23 24 26 26 26  '$ GLUCOSE 125* 123* 111* 108* 120* 125* 115*  BUN '16 14 16 19 21 21 22  '$ CREATININE 0.69 0.64 0.62 0.67 0.65 0.71 0.69  CALCIUM 9.2 8.8* 9.1 9.2 9.0 9.1 8.9  MG 2.0 1.9 2.0  --   --   --  2.0  PHOS 2.4* 3.1 3.9  --   --   --   --    GFR: Estimated Creatinine Clearance: 68.9 mL/min (by C-G formula based on SCr of 0.69 mg/dL). Liver Function Tests: No results for input(s): "AST", "ALT", "ALKPHOS", "BILITOT", "PROT", "ALBUMIN" in the last 168 hours. No results for input(s): "LIPASE", "AMYLASE" in the last 168 hours. No results for input(s): "AMMONIA" in the last 168 hours. Coagulation Profile: No results for input(s): "INR", "PROTIME" in the last 168 hours. Cardiac Enzymes: No results for input(s): "CKTOTAL", "CKMB", "CKMBINDEX", "TROPONINI" in the last 168 hours. BNP (last 3 results) No results for input(s): "PROBNP" in the last 8760 hours. HbA1C: No results for input(s): "HGBA1C" in the last 72 hours. CBG: Recent Labs  Lab 03/17/22 1203 03/17/22 1602 03/17/22 1951 03/17/22 2358 03/18/22 0424  GLUCAP 110* 104* 158* 130* 121*   Lipid Profile: No results for input(s): "CHOL", "HDL", "LDLCALC", "TRIG", "CHOLHDL", "LDLDIRECT" in the last 72 hours. Thyroid Function Tests: No results for input(s): "TSH", "T4TOTAL", "FREET4", "T3FREE", "THYROIDAB" in the last 72 hours. Anemia  Panel: No results for input(s): "VITAMINB12", "FOLATE", "FERRITIN", "TIBC", "IRON", "RETICCTPCT" in the last 72 hours. Sepsis Labs: No results for input(s): "PROCALCITON", "LATICACIDVEN" in the last 168 hours.  Recent Results (from the past 240 hour(s))  MRSA Next Gen by PCR, Nasal     Status: None   Collection Time: 03/09/22  7:47 PM   Specimen: Nasal Mucosa; Nasal Swab  Result Value Ref Range Status   MRSA by PCR Next Gen NOT DETECTED NOT DETECTED Final    Comment: (NOTE) The GeneXpert MRSA Assay (FDA approved for NASAL specimens only), is one component of a comprehensive MRSA colonization surveillance program. It is not intended to diagnose MRSA infection nor to guide or monitor treatment for MRSA infections. Test performance is not FDA approved in patients less than 56 years old. Performed at [facility], [place] [address], [place], [place] [address]          Radiology Studies: No results found.      Scheduled Meds:  aspirin  81 mg Per Tube Daily  atorvastatin  80 mg Per Tube Daily   cholecalciferol  1,000 Units Per Tube Daily   feeding supplement (PROSource TF20)  60 mL Per Tube Daily   fluticasone  1 spray Each Nare Daily   folic acid  1 mg Per Tube Daily   gabapentin  150 mg Per Tube QHS   levETIRAcetam  1,000 mg Per Tube BID   multivitamin with minerals  1 tablet Per Tube Daily   nicotine  21 mg Transdermal Daily   mouth rinse  15 mL Mouth Rinse 4 times per day   pantoprazole  40 mg Per Tube Daily   sodium chloride  1 g Per Tube TID   thiamine  100 mg Per Tube Daily   vitamin B-12  100 mcg Per Tube Daily   Continuous Infusions:  feeding supplement (VITAL 1.5 CAL) 1,000 mL (03/17/22 1559)     LOS: 9 days   Time spent= 35 mins    Ichiro Chesnut Arsenio Loader, MD [facility]  If 7PM-7AM, please contact night-coverage  03/18/2022, 8:08 AM

## 2022-03-18 NOTE — Progress Notes (Signed)
Physical Therapy Treatment Patient Details Name: Carlos Wise MRN: [id number] DOB: [birth year] Today's Date: 03/18/2022   History of Present Illness Patient is a 81 year old male with a  history of prostate cancer status, hypertension, esophageal dysphagia and stricture, right ICA aneurysm status post coiling, seizure disorder, chronic hyponatremia, smoker, chronic daily alcohol user, and recent admission with left lacunar stroke, )who presented to the emergency department for evaluation status post unwitnessed fall with head strike. MRI revealed likely acute CVA L pons. Multifactorial acute encephalopathy. also noted to have a R scalp hematoma along with trace L convexity SAH, likely traumatic from fall. SAH is stable on repeat imaging.    PT Comments    Patient progressing towards physical therapy goals. Able to progress to taking steps with RW and minA+2 but demos shuffling gait. Improving cognition but still demos deficits in awareness and problem solving. Encouraged patient to perform LE exercises while in bed and in recliner. Caregiver at bedside and supportive. Continue to recommend SNF for ongoing Physical Therapy.       Recommendations for follow up therapy are one component of a multi-disciplinary discharge planning process, led by the attending physician.  Recommendations may be updated based on patient status, additional functional criteria and insurance authorization.  Follow Up Recommendations  Skilled nursing-short term rehab (<3 hours/day) Can patient physically be transported by private vehicle: No   Assistance Recommended at Discharge Frequent or constant Supervision/Assistance  Patient can return home with the following Two people to help with walking and/or transfers;A lot of help with bathing/dressing/bathroom;Assistance with feeding;Direct supervision/assist for medications management;Direct supervision/assist for financial management;Assist for transportation;Help with stairs  or ramp for entrance   Equipment Recommendations  None recommended by PT    Recommendations for Other Services       Precautions / Restrictions Precautions Precautions: Fall Restrictions Weight Bearing Restrictions: No     Mobility  Bed Mobility Overal bed mobility: Needs Assistance Bed Mobility: Supine to Sit     Supine to sit: Mod assist     General bed mobility comments: step by step cues for sequencing bed mobility. ModA for trunk elevation and LE guidance    Transfers Overall transfer level: Needs assistance Equipment used: Rolling Zyron Deeley (2 wheels) Transfers: Sit to/from Stand Sit to Stand: Mod assist, +2 physical assistance           General transfer comment: assist to facilitate anterior weight shift and power up. Cues for hand placement. Once in standing, patient with posterior lean requiring cues to bring attention to lean but able to anteiror weight shift to correct    Ambulation/Gait Ambulation/Gait assistance: Min assist, +2 safety/equipment, +2 physical assistance Gait Distance (Feet): 5 Feet Assistive device: Rolling Izabell Schalk (2 wheels) Gait Pattern/deviations: Step-to pattern, Decreased stride length, Shuffle Gait velocity: decreased     General Gait Details: Assist for balance and RW management. Able to take shuffling steps towards recliner.   Stairs             Wheelchair Mobility    Modified Rankin (Stroke Patients Only) Modified Rankin (Stroke Patients Only) Pre-Morbid Rankin Score: Slight disability Modified Rankin: Moderately severe disability     Balance Overall balance assessment: Needs assistance Sitting-balance support: Feet supported Sitting balance-Leahy Scale: Fair     Standing balance support: Bilateral upper extremity supported, Reliant on assistive device for balance Standing balance-Leahy Scale: Poor Standing balance comment: reliant on UE support  Cognition  Arousal/Alertness: Awake/alert Behavior During Therapy: WFL for tasks assessed/performed Overall Cognitive Status: History of cognitive impairments - at baseline Area of Impairment: Following commands, Problem solving, Safety/judgement, Awareness                       Following Commands: Follows one step commands with increased time Safety/Judgement: Decreased awareness of deficits, Decreased awareness of safety Awareness: Emergent Problem Solving: Slow processing, Decreased initiation, Difficulty sequencing, Requires verbal cues, Requires tactile cues General Comments: HOH makes cognition difficult to assess        Exercises      General Comments        Pertinent Vitals/Pain Pain Assessment Pain Assessment: No/denies pain    Home Living                          Prior Function            PT Goals (current goals can now be found in the care plan section) Acute Rehab PT Goals PT Goal Formulation: With patient Time For Goal Achievement: 03/20/22 Potential to Achieve Goals: Fair Progress towards PT goals: Progressing toward goals    Frequency    Min 3X/week      PT Plan Current plan remains appropriate    Co-evaluation              AM-PAC PT "6 Clicks" Mobility   Outcome Measure  Help needed turning from your back to your side while in a flat bed without using bedrails?: A Lot Help needed moving from lying on your back to sitting on the side of a flat bed without using bedrails?: A Lot Help needed moving to and from a bed to a chair (including a wheelchair)?: A Lot Help needed standing up from a chair using your arms (e.g., wheelchair or bedside chair)?: A Lot Help needed to walk in hospital room?: Total Help needed climbing 3-5 steps with a railing? : Total 6 Click Score: 10    End of Session Equipment Utilized During Treatment: Gait belt Activity Tolerance: Patient tolerated treatment well Patient left: in chair;with call  bell/phone within reach;with chair alarm set Nurse Communication: Mobility status PT Visit Diagnosis: Unsteadiness on feet (R26.81);Muscle weakness (generalized) (M62.81)     Time: 5465-6812 PT Time Calculation (min) (ACUTE ONLY): 27 min  Charges:  $Gait Training: 8-22 mins $Therapeutic Activity: 8-22 mins                     Mariam Helbert A. Gilford Rile PT, DPT Acute Rehabilitation Services Office [phone]    Linna Hoff 03/18/2022, 5:29 PM

## 2022-03-18 NOTE — Progress Notes (Signed)
TF stopped per order.  TF for nocturnal feeding starting today 03/18/22 at 2000-0800.  See order.

## 2022-03-19 DIAGNOSIS — G9341 Metabolic encephalopathy: Secondary | ICD-10-CM | POA: Diagnosis not present

## 2022-03-19 LAB — BASIC METABOLIC PANEL
Anion gap: 9 (ref 5–15)
BUN: 22 mg/dL (ref 8–23)
CO2: 25 mmol/L (ref 22–32)
Calcium: 9 mg/dL (ref 8.9–10.3)
Chloride: 94 mmol/L — ABNORMAL LOW (ref 98–111)
Creatinine, Ser: 0.65 mg/dL (ref 0.61–1.24)
GFR, Estimated: >60 mL/min (ref >60)
Glucose, Bld: 118 mg/dL — ABNORMAL HIGH (ref 70–99)
Potassium: 4 mmol/L (ref 3.5–5.1)
Sodium: 128 mmol/L — ABNORMAL LOW (ref 135–145)

## 2022-03-19 LAB — CBC
HCT: 33.5 % — ABNORMAL LOW (ref 39.0–52.0)
Hemoglobin: 12.1 g/dL — ABNORMAL LOW (ref 13.0–17.0)
MCH: 32.9 pg (ref 26.0–34.0)
MCHC: 36.1 g/dL — ABNORMAL HIGH (ref 30.0–36.0)
MCV: 91 fL (ref 80.0–100.0)
Platelets: 285 10*3/uL (ref 150–400)
RBC: 3.68 MIL/uL — ABNORMAL LOW (ref 4.22–5.81)
RDW: 11.9 % (ref 11.5–15.5)
WBC: 9.1 10*3/uL (ref 4.0–10.5)
nRBC: 0 % (ref 0.0–0.2)

## 2022-03-19 LAB — MAGNESIUM: Magnesium: 2 mg/dL (ref 1.7–2.4)

## 2022-03-19 LAB — GLUCOSE, CAPILLARY
Glucose-Capillary: 98 mg/dL (ref 70–99)
Glucose-Capillary: 156 mg/dL — ABNORMAL HIGH (ref 70–99)
Glucose-Capillary: 102 mg/dL — ABNORMAL HIGH (ref 70–99)
Glucose-Capillary: 110 mg/dL — ABNORMAL HIGH (ref 70–99)
Glucose-Capillary: 106 mg/dL — ABNORMAL HIGH (ref 70–99)

## 2022-03-19 MED ORDER — ENSURE ENLIVE PO LIQD
237.0000 mL | Freq: Two times a day (BID) | ORAL | Status: DC
  Administered 2022-03-19 – 2022-03-25 (×8): 237 mL via ORAL

## 2022-03-19 NOTE — Progress Notes (Signed)
Brief Nutrition Note  Pt with nocturnal TF and PO diet. MD requests kcal count to determine if pt is taking in adequate PO to remove cortrak tube. Initiated kcal count and discussed with RN.   Will collect tickets tomorrow to determine intake over the previous 24 hours. If pt is unable to meet nutrition needs orally, could consider placement of PEG to assist in meeting needs until pt has recovered some of his baseline functional status. Could dc to SNF with PEG.   Ranell Patrick, RD, LDN Clinical Dietitian RD pager # available in [place]  After hours/weekend pager # available in [facility]

## 2022-03-19 NOTE — TOC Initial Note (Signed)
Transition of Care [facility]) - Initial/Assessment Note    Patient Details  Name: Carlos Wise MRN: [id number] Date of Birth: 1941-01-13  [facility] CM/SW Contact:    Tresa Endo Phone Number: 03/19/2022, 12:15 PM  Clinical Narrative:                 PT recommended SNF, pt is min/modA and is only walking 5 feet with RW. PT thinks that pt would progress with ongoing PT. Csw spoke with pt POA [name], she is agreeable to SNF. CSW explained the process and insurance. Pt does not have any offer right now, CSW will continue to follow up with POA on Snf offers when available.           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 (HCC)    Encephalopathy acute    SDH (subdural hematoma) (HCC)    Dysphagia    Moderate protein malnutrition ([place])    Seizure disorder ([place]) 02/28/2022   Tobacco use 02/28/2022   Depression 42/68/3419   Acute metabolic encephalopathy 62/22/9798   HTN (hypertension) 11/22/2021   HLD (hyperlipidemia) 11/22/2021   (HFpEF) heart failure with preserved ejection fraction ([place]) 11/22/2021   Altered mental status 02/07/2020   Aphasia 02/04/2020   Leukocytosis 02/04/2020   Acute on chronic hyponatremia 02/04/2020   Alcohol use 02/04/2020   Malignant neoplasm of prostate ([place]) 07/24/2014   PCP:  [name], MD Pharmacy:   [place],  [place] [address] [place] [address] Phone: [phone] Fax: [phone]  [id number] [id number]: [phone]: [phone]    Social Determinants of Health (SDOH) Interventions    Readmission Risk Interventions     No data to display

## 2022-03-19 NOTE — Evaluation (Signed)
Occupational Therapy Evaluation Patient Details Name: Carlos Wise MRN: [id number] DOB: 05-14-1941 Today's Date: 03/19/2022   History of Present Illness Patient is a 81 year old male with a  history of prostate cancer status, hypertension, esophageal dysphagia and stricture, right ICA aneurysm status post coiling, seizure disorder, chronic hyponatremia, smoker, chronic daily alcohol user, and recent admission with left lacunar stroke, )who presented to the emergency department for evaluation status post unwitnessed fall with head strike. MRI revealed likely acute CVA L pons. Multifactorial acute encephalopathy. also noted to have a R scalp hematoma along with trace L convexity SAH, likely traumatic from fall. SAH is stable on repeat imaging.   Clinical Impression   Pt at this time was in bed and took extended time to complete self feeding with cues to pace self and noted to not use RUE even though it is charted R handed and cued to attempt to use at times. Mr. Cerezo then was able to complete bed mobility supine to sitting with min guard with HOB elevated, sit to stand with min assist from elevated surface and completed side stepping with FW to chair with min assist. Pt was mostly presented with a flat affect in session unless irritated with lines in session. Pt currently with functional limitations due to the deficits listed below (see OT Problem List).  Pt will benefit from skilled OT to increase their safety and independence with ADL and functional mobility for ADL to facilitate discharge to venue listed below.        Recommendations for follow up therapy are one component of a multi-disciplinary discharge planning process, led by the attending physician.  Recommendations may be updated based on patient status, additional functional criteria and insurance authorization.   Follow Up Recommendations  Skilled nursing-short term rehab (<3 hours/day)    Assistance Recommended at Discharge Frequent or  constant Supervision/Assistance  Patient can return home with the following A lot of help with walking and/or transfers;A lot of help with bathing/dressing/bathroom;Assist for transportation;Direct supervision/assist for financial management;Direct supervision/assist for medications management    Functional Status Assessment  Patient has had a recent decline in their functional status and demonstrates the ability to make significant improvements in function in a reasonable and predictable amount of time.  Equipment Recommendations  None recommended by OT (TBD)    Recommendations for Other Services       Precautions / Restrictions Precautions Precautions: Fall Restrictions Weight Bearing Restrictions: No      Mobility Bed Mobility Overal bed mobility: Needs Assistance Bed Mobility: Supine to Sit     Supine to sit: Min guard, HOB elevated     General bed mobility comments: Pt needs an extended amount of time and became very irritated with lines    Transfers Overall transfer level: Needs assistance Equipment used: Rolling walker (2 wheels) Transfers: Sit to/from Stand Sit to Stand: Min assist, From elevated surface                  Balance Overall balance assessment: Needs assistance Sitting-balance support: Feet supported Sitting balance-Leahy Scale: Fair       Standing balance-Leahy Scale: Poor Standing balance comment: reliant on UE support                           ADL either performed or assessed with clinical judgement   ADL Overall ADL's : Needs assistance/impaired Eating/Feeding: Set up;Sitting   Grooming: Wash/dry hands;Set up;Cueing for safety;Cueing for sequencing;Sitting  Upper Body Bathing: Sitting;Minimal assistance   Lower Body Bathing: Maximal assistance;Cueing for safety;Cueing for sequencing;Sit to/from stand   Upper Body Dressing : Minimal assistance;Sitting   Lower Body Dressing: Maximal assistance;Cueing for  safety;Cueing for sequencing;Sit to/from stand   Toilet Transfer: Min guard;Rolling walker (2 wheels)   Toileting- Clothing Manipulation and Hygiene: Maximal assistance;Total assistance;Sit to/from stand       Functional mobility during ADLs: Minimal assistance;Cueing for safety;Cueing for sequencing;Rolling walker (2 wheels) General ADL Comments: Pt only took lateral side steps to chair at this time     Vision         Perception     Praxis      Pertinent Vitals/Pain Pain Assessment Pain Assessment: No/denies pain Faces Pain Scale: No hurt Breathing: normal Negative Vocalization: none Facial Expression: smiling or inexpressive Body Language: relaxed Consolability: no need to console PAINAD Score: 0 Facial Expression: Relaxed, neutral Body Movements: Absence of movements Muscle Tension: Relaxed Compliance with ventilator (intubated pts.): N/A Vocalization (extubated pts.): N/A CPOT Total: 0     Hand Dominance Right (Per chart says R but then only ate with LUE)   Extremity/Trunk Assessment Upper Extremity Assessment Upper Extremity Assessment: RUE deficits/detail RUE Deficits / Details: noted limited intiation in use and AROM to about 75 degrees AROM in forward flexion and abduction RUE Sensation:  (difficult to assess) RUE Coordination: decreased fine motor;decreased gross motor   Lower Extremity Assessment Lower Extremity Assessment: Defer to PT evaluation   Cervical / Trunk Assessment Cervical / Trunk Assessment: Kyphotic   Communication Communication Communication: HOH   Cognition Arousal/Alertness: Awake/alert Behavior During Therapy: Flat affect Overall Cognitive Status: History of cognitive impairments - at baseline Area of Impairment: Following commands, Problem solving, Safety/judgement, Awareness                 Orientation Level: Disoriented to, Situation, Time     Following Commands: Follows one step commands with increased  time Safety/Judgement: Decreased awareness of deficits, Decreased awareness of safety Awareness: Emergent Problem Solving: Slow processing, Decreased initiation, Difficulty sequencing, Requires verbal cues, Requires tactile cues General Comments: HOH makes cognition difficult to assess     General Comments       Exercises     Shoulder Instructions      Home Living Family/patient expects to be discharged to:: Private residence Living Arrangements: Alone Available Help at Discharge: Personal care attendant;Available 24 hours/day Type of Home: House Home Access: Stairs to enter [employer] of Steps: flight from garage, has chairlift   Home Layout: Multi-level     Bathroom Shower/Tub: Teacher, early years/pre: Standard Bathroom Accessibility: Yes   Home Equipment: Conservation officer, nature (2 wheels);Shower seat   Additional Comments: patient was unable to provide information. gathered home living situation from previous recent hospital admission      Prior Functioning/Environment Prior Level of Function : Patient poor historian/Family not available;Needs assist             Mobility Comments: per chart patient was ambulatory at home with occasional use of RW ADLs Comments: has caregivers but is able to participate with ADLs        OT Problem List: Decreased strength;Decreased range of motion;Decreased activity tolerance;Impaired balance (sitting and/or standing);Decreased safety awareness;Decreased knowledge of use of DME or AE;Cardiopulmonary status limiting activity      OT Treatment/Interventions: Self-care/ADL training;Therapeutic exercise;DME and/or AE instruction;Therapeutic activities;Cognitive remediation/compensation;Patient/family education;Balance training    OT Goals(Current goals can be found in the care plan section) Acute  Rehab OT Goals Patient Stated Goal: none Time For Goal Achievement: 04/02/22 Potential to Achieve Goals: Good  OT  Frequency: Min 2X/week    Co-evaluation              AM-PAC OT "6 Clicks" Daily Activity     Outcome Measure Help from another person eating meals?: None Help from another person taking care of personal grooming?: A Little Help from another person toileting, which includes using toliet, bedpan, or urinal?: A Lot Help from another person bathing (including washing, rinsing, drying)?: A Lot Help from another person to put on and taking off regular upper body clothing?: A Little Help from another person to put on and taking off regular lower body clothing?: A Lot 6 Click Score: 16   End of Session Equipment Utilized During Treatment: Gait belt;Rolling walker (2 wheels)  Activity Tolerance: Patient tolerated treatment well Patient left: in chair;with call bell/phone within reach;with chair alarm set  OT Visit Diagnosis: Unsteadiness on feet (R26.81);Other abnormalities of gait and mobility (R26.89);Muscle weakness (generalized) (M62.81)                Time: 2409-7353 OT Time Calculation (min): 60 min Charges:  OT General Charges $OT Visit: 1 Visit OT Evaluation $OT Eval Moderate Complexity: 1 Mod OT Treatments $Self Care/Home Management : 38-52 mins  Joeseph Amor OTR/L  Acute Rehab Services  [phone] office number [phone] pager number   Joeseph Amor 03/19/2022, 12:40 PM

## 2022-03-19 NOTE — Progress Notes (Signed)
PROGRESS NOTE    Carlos Wise  ZOX:[id number] DOB: 1940/10/08 DOA: 03/09/2022 PCP: Carlos Sake, MD   Brief Narrative:  81 year old with history of hypertension, hyperlipidemia, prostate cancer s/p radiation, seizure disorder, chronic hyponatremia, alcohol abuse (reportedly drinks 8 oz of gin daily), tobacco use 1 pack/day smoker, right ICA aneurysm s/p coiling, and recent left corona radiata acute lacunar infarction on 9/16 after being evaluated for altered mental status , Carlos Wise caregiver heard a bang while cleaning downstairs and went to find Carlos Wise had fallen after getting out of bed. He was noted to be making an abnormal noise and had liquid foaming at the mouth for 1 to 2 minutes before resolution.  Per triage RN " Pt was showering when he had an unwitnessed fall" He is aphasic on presentation,  Found to have small subdural bleed.  NSGY evaluated, no further workup needed, he is admitted to ICU for airway watch and high risk EtOH w/d, treated with phenobarb in the ICU for alcohol withdrawal. He is  off phenobarb, maintaining airway, his transfer out of ICU on 9/28.  Slowly working with speech therapy now advanced to D3 diet with full supervision.   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  Additionally there is also cortical dysfunction in right  temporal region likely secondary to underlying structural abnormality.  Lastly there is moderate diffuse encephalopathy, nonspecific etiology. No seizures or definite epileptiform discharges were seen throughout the recording -seen by neurology, On keppra, continue seizure precaution -s/p Repeat SS eval 10/3= D3 diet with full supervision.  We will plan to slowly take his cortrak out    Small acute infarcts in the left pons and left corona radiata  -Likely due to small vessel disease.  MRI confirmed CVA.  Echocardiogram EF 60%, LDL 161, A1c 5.1.  Continue aspirin, Lipitor 80 mg   Hyponatremia; improved.  Resolved.     Alcohol abuse Off phenobarbital.  No longer showing signs of withdrawal.  Continue multivitamin, folic acid and thiamine   Adult failure to thrive - P.o. intake during the day, nocturnal tube feeds   PT/OT-SNF   DVT prophylaxis: SCDs Start: 03/09/22 1629 Code Status: Full Family Communication:  Carlos Wise, updated periodically.   Awaiting placement but difficulty finding facility while he is in cortrak  Nutritional status    Signs/Symptoms: moderate fat depletion, severe muscle depletion  Interventions: Tube feeding, MVI  Body mass index is 26.79 kg/m.      Subjective: Seen and examined at bedside.  Attempting to work with occupational therapy.  Denies any complaints.   Examination: Constitutional: Not in acute distress, peers chronically ill.  Feeding tube in place. Respiratory: Minimal bibasilar crackles Cardiovascular: Normal sinus rhythm, no rubs Abdomen: Nontender nondistended good bowel sounds Musculoskeletal: No edema noted Skin: No rashes seen Neurologic: CN 2-12 grossly intact.  And nonfocal Psychiatric: Poor judgment and insight. Alert and oriented x 3. Normal mood.   Objective: Vitals:   03/18/22 0737  03/18/22 1648 03/18/22 2114 03/19/22 0816  BP: 123/68 129/70 127/74 116/75  Pulse: 73 81 87 75  Resp: '16 18  16  '$ Temp: 98.3 F  (36.8 C) 98.9 F (37.2 C) 97.6 F (36.4 C) 98.4 F (36.9 C)  TempSrc: Oral Oral Oral Oral  SpO2: 97% 99% 98% 97%  Weight:        Intake/Output Summary (Last 24 hours) at 03/19/2022 0823 Last data filed at 03/18/2022 1954 Gross per 24 hour  Intake 720 ml  Output 900 ml  Net -180 ml   Filed Weights   03/16/22 0541 03/17/22 0500 03/18/22 0500  Weight: 78.1 kg 77.2 kg 77.6 kg     Data Reviewed:   CBC: Recent Labs  Lab 03/18/22 0300 03/19/22 0234  WBC 8.8 9.1  HGB 12.4* 12.1*  HCT 36.1* 33.5*  MCV 93.8 91.0  PLT 271 527   Basic Metabolic Panel: Recent Labs  Lab 03/13/22 0228 03/14/22 0628 03/15/22 0507 03/16/22 0054 03/17/22 0240 03/18/22 0300 03/19/22 0234  NA 127* 128* 130* 129* 131* 132* 128*  K 3.9 4.1 4.2 4.3 4.5 4.3 4.0  CL 91* 96* 95* 95* 96* 96* 94*  CO2 '25 23 24 26 26 26 25  '$ GLUCOSE 123* 111* 108* 120* 125* 115* 118*  BUN '14 16 19 21 21 22 22  '$ CREATININE 0.64 0.62 0.67 0.65 0.71 0.69 0.65  CALCIUM 8.8* 9.1 9.2 9.0 9.1 8.9 9.0  MG 1.9 2.0  --   --   --  2.0 2.0  PHOS 3.1 3.9  --   --   --   --   --    GFR: Estimated Creatinine Clearance: 68.9 mL/min (by C-G formula based on SCr of 0.65 mg/dL). Liver Function Tests: No results for input(s): "AST", "ALT", "ALKPHOS", "BILITOT", "PROT", "ALBUMIN" in the last 168 hours. No results for input(s): "LIPASE", "AMYLASE" in the last 168 hours. No results for input(s): "AMMONIA" in the last 168 hours. Coagulation Profile: No results for input(s): "INR", "PROTIME" in the last 168 hours. Cardiac Enzymes: No results for input(s): "CKTOTAL", "CKMB", "CKMBINDEX", "TROPONINI" in the last 168 hours. BNP (last 3 results) No results for input(s): "PROBNP" in the last 8760 hours. HbA1C: No results for input(s): "HGBA1C" in the last 72 hours. CBG: Recent Labs  Lab 03/18/22 1646 03/18/22 2111 03/18/22 2344 03/19/22 0455 03/19/22 0821  GLUCAP 114* 140* 138* 98 156*   Lipid Profile: No results for input(s):  "CHOL", "HDL", "LDLCALC", "TRIG", "CHOLHDL", "LDLDIRECT" in the last 72 hours. Thyroid Function Tests: No results for input(s): "TSH", "T4TOTAL", "FREET4", "T3FREE", "THYROIDAB" in the last 72 hours. Anemia Panel: No results for input(s): "VITAMINB12", "FOLATE", "FERRITIN", "TIBC", "IRON", "RETICCTPCT" in the last 72 hours. Sepsis Labs: No results for input(s): "PROCALCITON", "LATICACIDVEN" in the last 168 hours.  Recent Results (from the past 240 hour(s))  MRSA Next Gen by PCR, Nasal     Status: None   Collection Time: 03/09/22  7:47 PM   Specimen: Nasal Mucosa; Nasal Swab  Result Value Ref Range Status   MRSA by PCR Next Gen NOT DETECTED NOT DETECTED Final    Comment: (NOTE) The GeneXpert MRSA Assay (FDA approved for NASAL specimens only), is one component of a comprehensive MRSA colonization surveillance program. It is not intended to diagnose MRSA infection nor to guide or monitor treatment for MRSA infections. Test performance is not FDA approved in patients less than 45 years old. Performed at [facility], [place] [address], [place], [place] [address]  Radiology Studies: No results found.      Scheduled Meds:  aspirin  81 mg Per Tube Daily   atorvastatin  80 mg Per Tube Daily   cholecalciferol  1,000 Units Per Tube Daily   feeding supplement (PROSource TF20)  60 mL Per Tube Daily   feeding supplement (VITAL 1.5 CAL)  1,000 mL Per Tube Q24H   fluticasone  1 spray Each Nare Daily   folic acid  1 mg Per Tube Daily   gabapentin  150 mg Per Tube QHS   levETIRAcetam  1,000 mg Per Tube BID   multivitamin with minerals  1 tablet Per Tube Daily   nicotine  21 mg Transdermal Daily   mouth rinse  15 mL Mouth Rinse 4 times per day   pantoprazole  40 mg Per Tube Daily   sodium chloride  1 g Per Tube TID   thiamine  100 mg Per Tube Daily   vitamin B-12  100 mcg Per Tube Daily   Continuous Infusions:     LOS: 10 days   Time spent= 35 mins    Cylinda Santoli  Arsenio Loader, MD [facility]  If 7PM-7AM, please contact night-coverage  03/19/2022, 8:23 AM

## 2022-03-20 DIAGNOSIS — G9341 Metabolic encephalopathy: Secondary | ICD-10-CM | POA: Diagnosis not present

## 2022-03-20 LAB — BASIC METABOLIC PANEL
Anion gap: 11 (ref 5–15)
BUN: 21 mg/dL (ref 8–23)
CO2: 25 mmol/L (ref 22–32)
Calcium: 9.7 mg/dL (ref 8.9–10.3)
Chloride: 93 mmol/L — ABNORMAL LOW (ref 98–111)
Creatinine, Ser: 0.71 mg/dL (ref 0.61–1.24)
GFR, Estimated: >60 mL/min (ref >60)
Glucose, Bld: 93 mg/dL (ref 70–99)
Potassium: 4.3 mmol/L (ref 3.5–5.1)
Sodium: 129 mmol/L — ABNORMAL LOW (ref 135–145)

## 2022-03-20 LAB — CBC
HCT: 35.6 % — ABNORMAL LOW (ref 39.0–52.0)
Hemoglobin: 12.5 g/dL — ABNORMAL LOW (ref 13.0–17.0)
MCH: 32.1 pg (ref 26.0–34.0)
MCHC: 35.1 g/dL (ref 30.0–36.0)
MCV: 91.3 fL (ref 80.0–100.0)
Platelets: 301 10*3/uL (ref 150–400)
RBC: 3.9 MIL/uL — ABNORMAL LOW (ref 4.22–5.81)
RDW: 11.9 % (ref 11.5–15.5)
WBC: 9.8 10*3/uL (ref 4.0–10.5)
nRBC: 0 % (ref 0.0–0.2)

## 2022-03-20 LAB — GLUCOSE, CAPILLARY
Glucose-Capillary: 100 mg/dL — ABNORMAL HIGH (ref 70–99)
Glucose-Capillary: 108 mg/dL — ABNORMAL HIGH (ref 70–99)
Glucose-Capillary: 128 mg/dL — ABNORMAL HIGH (ref 70–99)
Glucose-Capillary: 138 mg/dL — ABNORMAL HIGH (ref 70–99)
Glucose-Capillary: 150 mg/dL — ABNORMAL HIGH (ref 70–99)
Glucose-Capillary: 157 mg/dL — ABNORMAL HIGH (ref 70–99)

## 2022-03-20 LAB — MAGNESIUM: Magnesium: 2.1 mg/dL (ref 1.7–2.4)

## 2022-03-20 NOTE — TOC Progression Note (Signed)
Transition of Care [facility]) - Progression Note    Patient Details  Name: Carlos Wise MRN: [id number] Date of Birth: 09/15/1940  [facility] CM/SW Contact  Reece Agar, [place] Phone Number: 03/20/2022, 4:03 PM  Clinical Narrative:    Pt has no SNF offers, CSW will continue to follow for DP.        Expected Discharge Plan and Services                                                 Social Determinants of Health (SDOH) Interventions    Readmission Risk Interventions     No data to display

## 2022-03-20 NOTE — Progress Notes (Signed)
PROGRESS NOTE    Carlos Wise  QJJ:[id number] DOB: 1941-01-25 DOA: 03/09/2022 PCP: Carlos Sake, MD   Brief Narrative:  81 year old with history of hypertension, hyperlipidemia, prostate cancer s/p radiation, seizure disorder, chronic hyponatremia, alcohol abuse (reportedly drinks 8 oz of gin daily), tobacco use 1 pack/day smoker, right ICA aneurysm s/p coiling, and recent left corona radiata acute lacunar infarction on 9/16 after being evaluated for altered mental status , Carlos Wise caregiver heard a bang while cleaning downstairs and went to find Carlos Wise had fallen after getting out of bed. He was noted to be making an abnormal noise and had liquid foaming at the mouth for 1 to 2 minutes before resolution.  Per triage RN " Pt was showering when he had an unwitnessed fall" He is aphasic on presentation,  Found to have small subdural bleed.  NSGY evaluated, no further workup needed, he is admitted to ICU for airway watch and high risk EtOH w/d, treated with phenobarb in the ICU for alcohol withdrawal. He is  off phenobarb, maintaining airway, his transfer out of ICU on 9/28.  Slowly working with speech therapy now advanced to D3 diet with full supervision.   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  Additionally there is also cortical dysfunction in right  temporal region likely secondary to underlying structural abnormality.  Lastly there is moderate diffuse encephalopathy, nonspecific etiology. No seizures or definite epileptiform discharges were seen throughout the recording -seen by neurology, On keppra, continue seizure precaution -s/p Repeat SS eval 10/3= D3 diet with full supervision.  We will plan to slowly take his cortrak out. Allow family to bring meals as long as they comply with D3 diet.     Small acute infarcts in the left pons and left corona radiata  -Likely due to small vessel disease.  MRI confirmed CVA.  Echocardiogram EF 60%, LDL 161, A1c 5.1.  Continue aspirin, Lipitor 80 mg   Hyponatremia; improved.  Resolved.     Alcohol abuse Off phenobarbital.  No longer showing signs of withdrawal.  Continue multivitamin, folic acid and thiamine   Adult failure to thrive - P.o. intake during the day, nocturnal tube feeds   PT/OT-SNF   DVT prophylaxis: SCDs Start: 03/09/22 1629 Code Status: Full Family Communication:  Carlos Wise updated.   Awaiting placement but difficulty finding facility while he is in cortrak  Nutritional status    Signs/Symptoms: moderate fat depletion, severe muscle depletion  Interventions: Tube feeding, MVI  Body mass index is 27.86 kg/m.      Subjective: Poor PO intake overnight.  Would like to try home food.    Examination: Constitutional: Not in acute distress. Frail. FT in place.  Respiratory: Clear to auscultation bilaterally Cardiovascular: Normal sinus rhythm, no rubs Abdomen: Nontender nondistended good bowel sounds Musculoskeletal: No edema noted Skin: No rashes seen Neurologic: CN 2-12 grossly intact.  And nonfocal Psychiatric: Normal judgment and insight. Alert and oriented x 3. Normal mood.  Objective: Vitals:   03/19/22 1629 03/19/22 2130 03/20/22 0619 03/20/22 0841  BP: 122/71 131/76 122/63 (!) 141/94  Pulse: 70 82 74 84  Resp: '17 18 18 18  '$ Temp: 98 F (36.7  C) 98.3 F (36.8 C) 98.8 F (37.1 C)   TempSrc: Oral Oral Oral   SpO2: 98% 97% 96% 98%  Weight:   80.7 kg     Intake/Output Summary (Last 24 hours) at 03/20/2022 1154 Last data filed at 03/20/2022 0844 Gross per 24 hour  Intake 120 ml  Output 1150 ml  Net -1030 ml   Filed Weights   03/17/22 0500 03/18/22 0500 03/20/22 0619  Weight: 77.2 kg 77.6 kg 80.7 kg     Data Reviewed:   CBC: Recent Labs  Lab 03/18/22 0300 03/19/22 0234 03/20/22 0210  WBC 8.8 9.1 9.8  HGB 12.4* 12.1* 12.5*  HCT 36.1* 33.5* 35.6*  MCV 93.8 91.0 91.3  PLT 271 285 536   Basic Metabolic Panel: Recent Labs  Lab 03/14/22 0628 03/15/22 0507 03/16/22 0054 03/17/22 0240 03/18/22 0300 03/19/22 0234 03/20/22 0210  NA 128*   < > 129* 131* 132* 128* 129*  K 4.1   < > 4.3 4.5 4.3 4.0 4.3  CL 96*   < > 95* 96* 96* 94* 93*  CO2 23   < > '26 26 26 25 25  '$ GLUCOSE 111*   < > 120* 125* 115* 118* 93  BUN 16   < > '21 21 22 22 21  '$ CREATININE 0.62   < > 0.65 0.71 0.69 0.65 0.71  CALCIUM 9.1   < > 9.0 9.1 8.9 9.0 9.7  MG 2.0  --   --   --  2.0 2.0 2.1  PHOS 3.9  --   --   --   --   --   --    < > = values in this interval not displayed.   GFR: Estimated Creatinine Clearance: 74.9 mL/min (by C-G formula based on SCr of 0.71 mg/dL). Liver Function Tests: No results for input(s): "AST", "ALT", "ALKPHOS", "BILITOT", "PROT", "ALBUMIN" in the last 168 hours. No results for input(s): "LIPASE", "AMYLASE" in the last 168 hours. No results for input(s): "AMMONIA" in the last 168 hours. Coagulation Profile: No results for input(s): "INR", "PROTIME" in the last 168 hours. Cardiac Enzymes: No results for input(s): "CKTOTAL", "CKMB", "CKMBINDEX", "TROPONINI" in the last 168 hours. BNP (last 3 results) No results for input(s): "PROBNP" in the last 8760 hours. HbA1C: No results for input(s): "HGBA1C" in the last 72 hours. CBG: Recent Labs  Lab 03/19/22 1217 03/19/22 1631 03/19/22 2133 03/20/22 0621  03/20/22 0842  GLUCAP 102* 110* 106* 150* 128*   Lipid Profile: No results for input(s): "CHOL", "HDL", "LDLCALC", "TRIG", "CHOLHDL", "LDLDIRECT" in the last 72 hours. Thyroid Function Tests: No results for input(s): "TSH", "T4TOTAL", "FREET4", "T3FREE", "THYROIDAB" in the last 72 hours. Anemia Panel: No results for input(s): "VITAMINB12", "FOLATE", "FERRITIN", "TIBC", "IRON", "RETICCTPCT" in the last 72 hours. Sepsis Labs: No results for input(s): "PROCALCITON", "LATICACIDVEN" in the last 168 hours.  No results found for this or any previous visit (from the past 240 hour(s)).        Radiology Studies: No results found.      Scheduled Meds:  aspirin  81 mg Per Tube Daily   atorvastatin  80 mg Per Tube Daily   cholecalciferol  1,000 Units Per Tube Daily   feeding supplement  237 mL Oral BID BM   feeding  supplement (PROSource TF20)  60 mL Per Tube Daily   feeding supplement (VITAL 1.5 CAL)  1,000 mL Per Tube Q24H   fluticasone  1 spray Each Nare Daily   folic acid  1 mg Per Tube Daily   gabapentin  150 mg Per Tube QHS   levETIRAcetam  1,000 mg Per Tube BID   multivitamin with minerals  1 tablet Per Tube Daily   nicotine  21 mg Transdermal Daily   mouth rinse  15 mL Mouth Rinse 4 times per day   pantoprazole  40 mg Per Tube Daily   sodium chloride  1 g Per Tube TID   thiamine  100 mg Per Tube Daily   vitamin B-12  100 mcg Per Tube Daily   Continuous Infusions:     LOS: 11 days   Time spent= 35 mins    Nakoa Ganus Arsenio Loader, MD [facility]  If 7PM-7AM, please contact night-coverage  03/20/2022, 11:54 AM

## 2022-03-20 NOTE — Progress Notes (Addendum)
SLP Cancellation Note  Patient Details Name: Carlos Wise MRN: [id number] DOB: 11-21-1940   Cancelled treatment:       Reason Eval/Treat Not Completed: Fatigue/lethargy limiting ability to participate. Pt still sleeping soundly upon SLP arrival. SLP opened blinds, starting turning on lights and waking him up. His caregiver arrived and shared that it usually takes him at least 30 minutes to wake up before he can eat, so she suggests I return later today. She adds that he seems to be swallowing well with her this admission (and eats more than he does with other staff). He was supposed to have his esophagus stretched but this had to be put on hold due to this admission. She has brought in some food/drink and even seasoning that he enjoys at home to encourage intake, which is great. Will plan to f/u later as schedule allows.     Osie Bond., M.A. [place] Office [phone]  Secure chat preferred  03/20/2022, 10:09 AM

## 2022-03-20 NOTE — Progress Notes (Signed)
Calorie Count Note - Day 1  48-hour calorie count ordered.  Pt with very poor/minimal intake over the previous 24 hours. Caregiver is bringing in food today to try and attempt to get pt to eat more. Requested that kitchen bring in his meals at 10AM, 2PM, and 7PM, noted that they are still bringing his breakfast ~7AM. Will collect tickets on Monday to assess intake over the weekend.   Diet: DYS3, thin Supplements: Ensure Enlive BID  Estimated Nutritional Needs:  Kcal:  1800-2000 Protein:  90-110 grams Fluid:  1.8-2L  10/5 Breakfast: 184kcal, 5g protein (1/2 banana, 1/2 cup juice, 1/2 bowl cheerios) 10/5 Lunch: 37kcal, 1g protein (bites of soup and jello) 10/5 Dinner: 37 kcal, 5g protein (bites of peas, chicken, and rice) Supplements: refused ensure  Total intake: 258 kcal (14% of minimum estimated needs)  11 protein (12% of minimum estimated needs)  NUTRITION DIAGNOSIS:  Moderate Malnutrition related to social / environmental circumstances (EtOH abuse) as evidenced by moderate fat depletion, severe muscle depletion. - remains applicable   GOAL:  Patient will meet greater than or equal to 90% of their needs - progressing, TF providing nutrition, diet advanced, supplements in place  INTERVENTION:  Continue current diet as ordered per SLP Encourage PO intake Ensure Enlive po BID, each supplement provides 350 kcal and 20 grams of protein. Tube feeds via Cortrak, nocturnal x 12 hours (8pm-8am): Vital 1.5 @ 75 ml/hr (900 ml/day) PROSource TF20 60 ml daily. Tube feeding regimen at goal provides 1430 kcal, 81 grams of protein, and 688 ml of free water. Continue MVI with minerals daily per tube  Ranell Patrick, RD, LDN Clinical Dietitian RD pager # available in [facility]  After hours/weekend pager # available in [facility]

## 2022-03-20 NOTE — Progress Notes (Signed)
Refused to eat dinner.

## 2022-03-20 NOTE — Progress Notes (Signed)
Mobility Specialist - Progress Note   03/20/22 1427  Mobility  Activity Stood at bedside  Activity Response Tolerated fair  Distance Ambulated (ft) 0 ft  $Mobility charge 1 Mobility  Level of Assistance Minimal assist, patient does 75% or more (+2)  Assistive Device Front wheel walker    Pt received in bed requesting to sit in recliner. C/o stiffness in legs while standing. Left sitting EOB w/ PT in room.   Paulla Dolly Mobility Specialist

## 2022-03-20 NOTE — Progress Notes (Signed)
Speech Language Pathology Treatment: Dysphagia  Patient Details Name: Carlos Wise MRN: [id number] DOB: 1940/07/28 Today's Date: 03/20/2022 Time: 3491-7915 SLP Time Calculation (min) (ACUTE ONLY): 14 min  Assessment / Plan / Recommendation Clinical Impression  Carlos Wise is waking up more upon SLP return, with caregiver present and assisting with feeding. SLP provided education about the importance of sitting upright for PO intake and elevated his head more before he initiated drinking juice. Oral holding and multiple swallows noted that are consistent with MBS findings. No overt s/s of aspiration observed. Note that the food she was offering to Carlos Wise was much more mashed up than mechanical soft would be, so discussed other possible options for diet textures. Given his limited intake already, we decided to keep with mechanical soft diet so as to not modify his food any further than it needs to be. She is agreeable to modifying food on trays more if necessary for him to be able to chew it. SLP will continue to follow at least briefly.   HPI HPI: Carlos Wise is an 81 yo male presenting after unwitnessed fall in the shower. CT Head showed 74m SDH along the L frontal convexity. MRI revealed likely acute CVA L pons and L corona radiata. MBS 01/08/22 with some oral holding and c/o globus sensation but with otherwise functional oropharyngeal swallow. speech/languag eveal 02/05/20 with cognitive impairment and severe aphasia with expressive/receptive deficits (wernicke's-like appearance) as well as possible motor planning impairment. PMH includes: esophageal dysphagia and stricture[facility], R ICA aneurysm s/p coiling, seizure disorder, smoker, chronic daily alcohol user, prostate ca s/p XRT, HTN, chronic hyponatremia      SLP Plan  Continue with current plan of care      Recommendations for follow up therapy are one component of a multi-disciplinary discharge planning process, led by the  attending physician.  Recommendations may be updated based on patient status, additional functional criteria and insurance authorization.    Recommendations  Diet recommendations: Dysphagia 3 (mechanical soft);Thin liquid Liquids provided via: Cup;Straw Medication Administration: Crushed with puree Supervision: Staff to assist with self feeding;Full supervision/cueing for compensatory strategies Compensations: Slow rate;Small sips/bites;Minimize environmental distractions Postural Changes and/or Swallow Maneuvers: Seated upright 90 degrees                Oral Care Recommendations: Oral care BID Follow Up Recommendations: Skilled nursing-short term rehab (<3 hours/day) Assistance recommended at discharge: Frequent or constant Supervision/Assistance SLP Visit Diagnosis: Dysphagia, oral phase (R13.11) Plan: Continue with current plan of care           LOsie Bond, M.A. [place] ([phone] Secure chat preferred   03/20/2022, 11:19 AM

## 2022-03-20 NOTE — Progress Notes (Signed)
Occupational Therapy Treatment Patient Details Name: Carlos Wise MRN: [id number] DOB: 1941/05/17 Today's Date: 03/20/2022   History of present illness Patient is a 81 year old male with a  history of prostate cancer status, hypertension, esophageal dysphagia and stricture, right ICA aneurysm status post coiling, seizure disorder, chronic hyponatremia, smoker, chronic daily alcohol user, and recent admission with left lacunar stroke, )who presented to the emergency department for evaluation status post unwitnessed fall with head strike. MRI revealed likely acute CVA L pons. Multifactorial acute encephalopathy. also noted to have a R scalp hematoma along with trace L convexity SAH, likely traumatic from fall. SAH is stable on repeat imaging.   OT comments  Patient continues to make progress towards goals in skilled OT session. Patient's session encompassed functional ambulation, transfers on and off toilet, and ADLs. Patient requiring increased assist in session, with mod A of 2 to stand, with posterior lean. Patient agitated and aggressive throughout session, as patient would demonstrate a LOB with OT and PT intervention and need for increased assist to manipulate RLE (potential R inattention?) and would take out his frustrations with his progress on therapist's. Patient also noted to require increased assist to complete basic tasks at sink with cues for sequencing. OT recommendation remains appropriate, will continue to follow.    Recommendations for follow up therapy are one component of a multi-disciplinary discharge planning process, led by the attending physician.  Recommendations may be updated based on patient status, additional functional criteria and insurance authorization.    Follow Up Recommendations  Skilled nursing-short term rehab (<3 hours/day)    Assistance Recommended at Discharge Frequent or constant Supervision/Assistance  Patient can return home with the following  A lot of help  with walking and/or transfers;A lot of help with bathing/dressing/bathroom;Assist for transportation;Direct supervision/assist for financial management;Direct supervision/assist for medications management   Equipment Recommendations       Recommendations for Other Services      Precautions / Restrictions Precautions Precautions: Fall Restrictions Weight Bearing Restrictions: No       Mobility Bed Mobility               General bed mobility comments: patient sitting EOB upon arrival    Transfers Overall transfer level: Needs assistance Equipment used: Rolling walker (2 wheels) Transfers: Sit to/from Stand Sit to Stand: Mod assist, +2 physical assistance, +2 safety/equipment           General transfer comment: cues for hand placement, posterior lean throughout, scissoring of gait and decreasing base of support throughout transfers, x3 LOB at least in session requiring PT/OT intervention for safety     Balance Overall balance assessment: Needs assistance Sitting-balance support: Feet supported Sitting balance-Leahy Scale: Fair   Postural control: Posterior lean Standing balance support: Bilateral upper extremity supported, Reliant on assistive device for balance Standing balance-Leahy Scale: Zero Standing balance comment: reliant on UE support and frequent LOB                           ADL either performed or assessed with clinical judgement   ADL Overall ADL's : Needs assistance/impaired     Grooming: Wash/dry hands;Cueing for safety;Cueing for sequencing;Standing;Minimal assistance Grooming Details (indicate cue type and reason): cues to sequence hand washing, kept rubbing hands with soap without water                 Toilet Transfer: Moderate assistance;Ambulation;Rolling walker (2 wheels) Toilet Transfer Details (indicate cue type  and reason): patient with increased difficulty to ambulate to toilet, cues and assist to turn to the R, assist  to spread legs apart to prevent LOB, 1x of LOB noted         Functional mobility during ADLs: Moderate assistance;+2 for physical assistance;+2 for safety/equipment;Rolling walker (2 wheels) General ADL Comments: Patient requiring increased assist to ambulate, with multiple LOB noted, and assist to position RLE due to possible R inattention    Extremity/Trunk Assessment              Vision       Perception     Praxis      Cognition Arousal/Alertness: Awake/alert Behavior During Therapy: Flat affect, Agitated Overall Cognitive Status: History of cognitive impairments - at baseline Area of Impairment: Following commands, Problem solving, Safety/judgement, Awareness, Memory, Attention, Orientation                 Orientation Level: Time Current Attention Level: Sustained Memory: Decreased short-term memory Following Commands: Follows one step commands with increased time Safety/Judgement: Decreased awareness of deficits, Decreased awareness of safety Awareness: Emergent Problem Solving: Slow processing, Decreased initiation, Difficulty sequencing, Requires verbal cues, Requires tactile cues General Comments: Patient frequently yelling at PT/OT stating they hadnt been working with him, requiring cues to sequence transfers especially to the R (R inattention??), requiring cues to wash hands, as patient stood at sink rubbing his hands with soap without water        Exercises      Shoulder Instructions       General Comments      Pertinent Vitals/ Pain       Pain Assessment Pain Assessment: Faces Faces Pain Scale: Hurts even more Pain Location: NG tube inadvertently pulled Pain Descriptors / Indicators: Discomfort, Grimacing Pain Intervention(s): Limited activity within patient's tolerance, Monitored during session, Repositioned  Home Living                                          Prior Functioning/Environment               Frequency  Min 2X/week        Progress Toward Goals  OT Goals(current goals can now be found in the care plan section)  Progress towards OT goals: Progressing toward goals  Acute Rehab OT Goals Patient Stated Goal: to get you out of here OT Goal Formulation: Patient unable to participate in goal setting Time For Goal Achievement: 04/02/22 Potential to Achieve Goals: [place] Discharge plan remains appropriate    Co-evaluation                 AM-PAC OT "6 Clicks" Daily Activity     Outcome Measure   Help from another person eating meals?: None Help from another person taking care of personal grooming?: A Little Help from another person toileting, which includes using toliet, bedpan, or urinal?: A Lot Help from another person bathing (including washing, rinsing, drying)?: A Lot Help from another person to put on and taking off regular upper body clothing?: A Little Help from another person to put on and taking off regular lower body clothing?: A Lot 6 Click Score: 16    End of Session Equipment Utilized During Treatment: Gait belt;Rolling walker (2 wheels)  OT Visit Diagnosis: Unsteadiness on feet (R26.81);Other abnormalities of gait and mobility (R26.89);Muscle weakness (generalized) (M62.81)   Activity  Tolerance Treatment limited secondary to agitation   Patient Left in chair;with call bell/phone within reach;with chair alarm set   Nurse Communication Mobility status        Time: 3074-6002 OT Time Calculation (min): 23 min  Charges: OT General Charges $OT Visit: 1 Visit OT Treatments $Self Care/Home Management : 8-22 mins  Corinne Ports E. Dameian Crisman, OTR/L Acute Rehabilitation Services [phone]   [name] 03/20/2022, 4:11 PM

## 2022-03-20 NOTE — Progress Notes (Signed)
Physical Therapy Treatment Patient Details Name: Carlos Wise MRN: [id number] DOB: Jul 03, 1940 Today's Date: 03/20/2022   History of Present Illness Patient is a 81 year old male with a  history of prostate cancer status, hypertension, esophageal dysphagia and stricture, right ICA aneurysm status post coiling, seizure disorder, chronic hyponatremia, smoker, chronic daily alcohol user, and recent admission with left lacunar stroke, )who presented to the emergency department for evaluation status post unwitnessed fall with head strike. MRI revealed likely acute CVA L pons. Multifactorial acute encephalopathy. also noted to have a R scalp hematoma along with trace L convexity SAH, likely traumatic from fall. SAH is stable on repeat imaging.    PT Comments    Patient making steady progress towards physical therapy goals. Patient seen in conjunction with OT to maximize activity tolerance and progression. Patient requiring modA+2 for sit to stand and ambulation in room due to scissoring and at least 3 LOBs requiring PT/OT intervention for safety. Patient cursing at therapist and expressing frustration. Continue to recommend SNF for ongoing Physical Therapy.       Recommendations for follow up therapy are one component of a multi-disciplinary discharge planning process, led by the attending physician.  Recommendations may be updated based on patient status, additional functional criteria and insurance authorization.  Follow Up Recommendations  Skilled nursing-short term rehab (<3 hours/day) Can patient physically be transported by private vehicle: No   Assistance Recommended at Discharge Frequent or constant Supervision/Assistance  Patient can return home with the following Two people to help with walking and/or transfers;A lot of help with bathing/dressing/bathroom;Assistance with feeding;Direct supervision/assist for medications management;Direct supervision/assist for financial management;Assist for  transportation;Help with stairs or ramp for entrance   Equipment Recommendations  None recommended by PT    Recommendations for Other Services       Precautions / Restrictions Precautions Precautions: Fall Restrictions Weight Bearing Restrictions: No     Mobility  Bed Mobility               General bed mobility comments: patient sitting EOB upon arrival    Transfers Overall transfer level: Needs assistance Equipment used: Rolling Luian Schumpert (2 wheels) Transfers: Sit to/from Stand Sit to Stand: Mod assist, +2 physical assistance, +2 safety/equipment           General transfer comment: cues for hand placement with posterior lean initially    Ambulation/Gait Ambulation/Gait assistance: Mod assist, +2 physical assistance, +2 safety/equipment Gait Distance (Feet): 10 Feet (10) Assistive device: Rolling Karson Chicas (2 wheels) Gait Pattern/deviations: Step-to pattern, Decreased stride length, Shuffle, Scissoring Gait velocity: decreased     General Gait Details: scissoring of gait and decreasing base of support throughout transfers, x3 LOB at least in session requiring PT/OT intervention for safety   Stairs             Wheelchair Mobility    Modified Rankin (Stroke Patients Only)       Balance Overall balance assessment: Needs assistance Sitting-balance support: Feet supported Sitting balance-Leahy Scale: Fair   Postural control: Posterior lean Standing balance support: Bilateral upper extremity supported, Reliant on assistive device for balance Standing balance-Leahy Scale: Poor                              Cognition Arousal/Alertness: Awake/alert Behavior During Therapy: Flat affect, Agitated Overall Cognitive Status: History of cognitive impairments - at baseline Area of Impairment: Following commands, Problem solving, Safety/judgement, Awareness, Memory, Attention, Orientation  Orientation Level: Time Current  Attention Level: Sustained Memory: Decreased short-term memory Following Commands: Follows one step commands with increased time Safety/Judgement: Decreased awareness of deficits, Decreased awareness of safety Awareness: Emergent Problem Solving: Slow processing, Decreased initiation, Difficulty sequencing, Requires verbal cues, Requires tactile cues General Comments: Patient frequently yelling at PT/OT stating they hadnt been working with him, requiring cues to sequence transfers especially to the R (R inattention??), requiring cues to wash hands, as patient stood at sink rubbing his hands with soap without water        Exercises      General Comments        Pertinent Vitals/Pain Pain Assessment Pain Assessment: Faces Faces Pain Scale: Hurts even more Pain Location: NG tube inadvertently pulled Pain Descriptors / Indicators: Discomfort, Grimacing Pain Intervention(s): Monitored during session    Home Living                          Prior Function            PT Goals (current goals can now be found in the care plan section) Acute Rehab PT Goals Patient Stated Goal: to get out of bed PT Goal Formulation: With patient Time For Goal Achievement: 04/03/22 Potential to Achieve Goals: Fair Progress towards PT goals: Progressing toward goals    Frequency    Min 3X/week      PT Plan Current plan remains appropriate    Co-evaluation              AM-PAC PT "6 Clicks" Mobility   Outcome Measure  Help needed turning from your back to your side while in a flat bed without using bedrails?: A Lot Help needed moving from lying on your back to sitting on the side of a flat bed without using bedrails?: A Lot Help needed moving to and from a bed to a chair (including a wheelchair)?: A Lot Help needed standing up from a chair using your arms (e.g., wheelchair or bedside chair)?: A Lot Help needed to walk in hospital room?: Total Help needed climbing 3-5 steps  with a railing? : Total 6 Click Score: 10    End of Session Equipment Utilized During Treatment: Gait belt Activity Tolerance: Patient tolerated treatment well Patient left: in chair;with call bell/phone within reach;with chair alarm set Nurse Communication: Mobility status PT Visit Diagnosis: Unsteadiness on feet (R26.81);Muscle weakness (generalized) (M62.81)     Time: 7680-8811 PT Time Calculation (min) (ACUTE ONLY): 23 min  Charges:  $Gait Training: 8-22 mins                     Kevonna Nolte A. Gilford Rile PT, DPT Acute Rehabilitation Services Office [phone]    Linna Hoff 03/20/2022, 5:18 PM

## 2022-03-21 DIAGNOSIS — G9341 Metabolic encephalopathy: Secondary | ICD-10-CM | POA: Diagnosis not present

## 2022-03-21 LAB — CBC
HCT: 36.5 % — ABNORMAL LOW (ref 39.0–52.0)
Hemoglobin: 12.6 g/dL — ABNORMAL LOW (ref 13.0–17.0)
MCH: 32 pg (ref 26.0–34.0)
MCHC: 34.5 g/dL (ref 30.0–36.0)
MCV: 92.6 fL (ref 80.0–100.0)
Platelets: 312 10*3/uL (ref 150–400)
RBC: 3.94 MIL/uL — ABNORMAL LOW (ref 4.22–5.81)
RDW: 11.9 % (ref 11.5–15.5)
WBC: 9.9 10*3/uL (ref 4.0–10.5)
nRBC: 0 % (ref 0.0–0.2)

## 2022-03-21 LAB — BASIC METABOLIC PANEL
Anion gap: 11 (ref 5–15)
BUN: 18 mg/dL (ref 8–23)
CO2: 25 mmol/L (ref 22–32)
Calcium: 9.3 mg/dL (ref 8.9–10.3)
Chloride: 94 mmol/L — ABNORMAL LOW (ref 98–111)
Creatinine, Ser: 0.61 mg/dL (ref 0.61–1.24)
GFR, Estimated: >60 mL/min (ref >60)
Glucose, Bld: 128 mg/dL — ABNORMAL HIGH (ref 70–99)
Potassium: 3.8 mmol/L (ref 3.5–5.1)
Sodium: 130 mmol/L — ABNORMAL LOW (ref 135–145)

## 2022-03-21 LAB — GLUCOSE, CAPILLARY
Glucose-Capillary: 117 mg/dL — ABNORMAL HIGH (ref 70–99)
Glucose-Capillary: 123 mg/dL — ABNORMAL HIGH (ref 70–99)
Glucose-Capillary: 125 mg/dL — ABNORMAL HIGH (ref 70–99)
Glucose-Capillary: 147 mg/dL — ABNORMAL HIGH (ref 70–99)
Glucose-Capillary: 156 mg/dL — ABNORMAL HIGH (ref 70–99)
Glucose-Capillary: 157 mg/dL — ABNORMAL HIGH (ref 70–99)

## 2022-03-21 LAB — MAGNESIUM: Magnesium: 2.3 mg/dL (ref 1.7–2.4)

## 2022-03-21 NOTE — Progress Notes (Signed)
PROGRESS NOTE    Carlos Wise  XBM:[id number] DOB: 08/18/1940 DOA: 03/09/2022 PCP: Carlos Sake, MD   Brief Narrative:  81 year old with history of hypertension, hyperlipidemia, prostate cancer s/p radiation, seizure disorder, chronic hyponatremia, alcohol abuse (reportedly drinks 8 oz of gin daily), tobacco use 1 pack/day smoker, right ICA aneurysm s/p coiling, and recent left corona radiata acute lacunar infarction on 9/16 after being evaluated for altered mental status , Carlos Wise caregiver heard a bang while cleaning downstairs and went to find Carlos Wise had fallen after getting out of bed. He was noted to be making an abnormal noise and had liquid foaming at the mouth for 1 to 2 minutes before resolution.  Per triage RN " Pt was showering when he had an unwitnessed fall" He is aphasic on presentation,  Found to have small subdural bleed.  NSGY evaluated, no further workup needed, he is admitted to ICU for airway watch and high risk EtOH w/d, treated with phenobarb in the ICU for alcohol withdrawal. He is  off phenobarb, maintaining airway, his transfer out of ICU on 9/28.  Cortrak in place, ongoing calorie count and slowly advancing diet.   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  MRI  confirmed CVA.  Echocardiogram EF 60%, LDL 161, A1c 5.1.  Continue aspirin, Lipitor 80 mg   Hyponatremia; improved.  Resolved.     Alcohol abuse Off phenobarbital.  No longer showing signs of withdrawal.  Continue multivitamin, folic acid and thiamine   Adult failure to thrive - P.o. intake during the day, nocturnal tube feeds, ongoing calorie count -s/p Repeat SS eval 10/3= D3 diet with full supervision.  Cortrak in place, ongoing calorie count in anticipation to remove feeding tube.   PT/OT-SNF   DVT prophylaxis: SCDs Start: 03/09/22 1629 Code Status: Full Family Communication: Sister-in-law at bedside  Awaiting placement but difficulty finding facility while he is in cortrak  Nutritional status    Signs/Symptoms: moderate fat depletion, severe muscle depletion  Interventions: Tube feeding, MVI  Body mass index is 26.28 kg/m.      Subjective:  Patient is awake attempting to eat his breakfast.  This is the most awake I have seen him.  I have encouraged him oral intake and he agrees how important this is.  Examination: Constitutional: Not in acute distress Respiratory: Clear to auscultation bilaterally Cardiovascular: Normal sinus rhythm, no rubs Abdomen: Nontender nondistended good bowel sounds Musculoskeletal: No edema noted Skin: No rashes seen Neurologic: CN 2-12 grossly intact.  And nonfocal Psychiatric: Normal judgment and insight. Alert and oriented x 3. Normal mood.  Cortrak in place  Objective: Vitals:   03/20/22 1937 03/21/22 0416 03/21/22 0500 03/21/22 0750  BP: 101/66 108/66  (!) 148/79  Pulse: 73 66  74  Resp: '17 17  18  '$ Temp: 98.1 F (36.7 C) 98 F (36.7 C)  98.2 F (36.8 C)  TempSrc: Oral Oral    SpO2: 98% 98%  100%  Weight:   76.1 kg     Intake/Output Summary (Last 24 hours) at 03/21/2022 0818 Last data filed at 03/21/2022 0416 Gross per 24 hour  Intake 120 ml  Output 1050 ml  Net -930 ml   Filed Weights   03/18/22 0500 03/20/22  0619 03/21/22 0500  Weight: 77.6 kg 80.7 kg 76.1 kg     Data Reviewed:   CBC: Recent Labs  Lab 03/18/22 0300 03/19/22 0234 03/20/22 0210 03/21/22 0159  WBC 8.8 9.1 9.8 9.9  HGB 12.4* 12.1* 12.5* 12.6*  HCT 36.1* 33.5* 35.6* 36.5*  MCV 93.8 91.0 91.3 92.6  PLT 271 285 301 284   Basic Metabolic Panel: Recent Labs  Lab 03/17/22 0240 03/18/22 0300 03/19/22 0234 03/20/22 0210 03/21/22 0159  NA 131* 132* 128* 129* 130*  K 4.5 4.3 4.0 4.3 3.8  CL 96* 96* 94* 93* 94*  CO2 '26 26 25 25 25  '$ GLUCOSE 125* 115* 118* 93 128*  BUN '21 22 22 21 18  '$ CREATININE 0.71 0.69 0.65 0.71 0.61  CALCIUM 9.1 8.9 9.0 9.7 9.3  MG  --  2.0 2.0 2.1 2.3   GFR: Estimated Creatinine Clearance: 68.9 mL/min (by C-G formula based on SCr of 0.61 mg/dL). Liver Function Tests: No results for input(s): "AST", "ALT", "ALKPHOS", "BILITOT", "PROT", "ALBUMIN" in the last 168 hours. No results for input(s): "LIPASE", "AMYLASE" in the last 168 hours. No results for input(s): "AMMONIA" in the last 168 hours. Coagulation Profile: No results for input(s): "INR", "PROTIME" in the last 168 hours. Cardiac Enzymes: No results for input(s): "CKTOTAL", "CKMB", "CKMBINDEX", "TROPONINI" in the last 168 hours. BNP (last 3 results) No results for input(s): "PROBNP" in the last 8760 hours. HbA1C: No results for input(s): "HGBA1C" in the last 72 hours. CBG: Recent Labs  Lab 03/20/22 1610 03/20/22 1934 03/20/22 2333 03/21/22 0412 03/21/22 0752  GLUCAP 100* 108* 138* 125* 147*   Lipid Profile: No results for input(s): "CHOL", "HDL", "LDLCALC", "TRIG", "CHOLHDL", "LDLDIRECT" in the last 72 hours. Thyroid Function Tests: No results for input(s): "TSH", "T4TOTAL", "FREET4", "T3FREE", "THYROIDAB" in the last 72 hours. Anemia Panel: No results for input(s): "VITAMINB12", "FOLATE", "FERRITIN", "TIBC", "IRON", "RETICCTPCT" in the last 72 hours. Sepsis Labs: No results for input(s): "PROCALCITON", "LATICACIDVEN" in the  last 168 hours.  No results found for this or any previous visit (from the past 240 hour(s)).        Radiology Studies: No results found.      Scheduled Meds:  aspirin  81 mg Per Tube Daily   atorvastatin  80 mg Per Tube Daily   cholecalciferol  1,000 Units Per Tube Daily   feeding supplement  237 mL Oral BID BM   feeding supplement (PROSource TF20)  60 mL Per Tube Daily   feeding supplement (VITAL 1.5 CAL)  1,000 mL Per Tube Q24H   fluticasone  1 spray Each Nare Daily   folic acid  1 mg Per Tube Daily   gabapentin  150 mg Per Tube QHS   levETIRAcetam  1,000 mg Per Tube BID   multivitamin with minerals  1 tablet Per Tube Daily   nicotine  21 mg Transdermal Daily   mouth rinse  15 mL Mouth Rinse 4 times per day   pantoprazole  40 mg Per Tube Daily   sodium chloride  1 g Per Tube TID   thiamine  100 mg Per Tube Daily   vitamin  B-12  100 mcg Per Tube Daily   Continuous Infusions:     LOS: 12 days   Time spent= 35 mins    Jaron Czarnecki Arsenio Loader, MD [facility]  If 7PM-7AM, please contact night-coverage  03/21/2022, 8:18 AM

## 2022-03-22 DIAGNOSIS — G9341 Metabolic encephalopathy: Secondary | ICD-10-CM | POA: Diagnosis not present

## 2022-03-22 DIAGNOSIS — Z515 Encounter for palliative care: Secondary | ICD-10-CM | POA: Diagnosis not present

## 2022-03-22 DIAGNOSIS — Z7189 Other specified counseling: Secondary | ICD-10-CM | POA: Diagnosis not present

## 2022-03-22 LAB — BASIC METABOLIC PANEL
Anion gap: 12 (ref 5–15)
BUN: 20 mg/dL (ref 8–23)
CO2: 24 mmol/L (ref 22–32)
Calcium: 9.2 mg/dL (ref 8.9–10.3)
Chloride: 93 mmol/L — ABNORMAL LOW (ref 98–111)
Creatinine, Ser: 0.68 mg/dL (ref 0.61–1.24)
GFR, Estimated: >60 mL/min (ref >60)
Glucose, Bld: 128 mg/dL — ABNORMAL HIGH (ref 70–99)
Potassium: 4.1 mmol/L (ref 3.5–5.1)
Sodium: 129 mmol/L — ABNORMAL LOW (ref 135–145)

## 2022-03-22 LAB — CBC
HCT: 33.5 % — ABNORMAL LOW (ref 39.0–52.0)
Hemoglobin: 12.2 g/dL — ABNORMAL LOW (ref 13.0–17.0)
MCH: 33 pg (ref 26.0–34.0)
MCHC: 36.4 g/dL — ABNORMAL HIGH (ref 30.0–36.0)
MCV: 90.5 fL (ref 80.0–100.0)
Platelets: 280 10*3/uL (ref 150–400)
RBC: 3.7 MIL/uL — ABNORMAL LOW (ref 4.22–5.81)
RDW: 11.9 % (ref 11.5–15.5)
WBC: 8.6 10*3/uL (ref 4.0–10.5)
nRBC: 0 % (ref 0.0–0.2)

## 2022-03-22 LAB — GLUCOSE, CAPILLARY
Glucose-Capillary: 123 mg/dL — ABNORMAL HIGH (ref 70–99)
Glucose-Capillary: 128 mg/dL — ABNORMAL HIGH (ref 70–99)
Glucose-Capillary: 143 mg/dL — ABNORMAL HIGH (ref 70–99)
Glucose-Capillary: 100 mg/dL — ABNORMAL HIGH (ref 70–99)

## 2022-03-22 LAB — MAGNESIUM: Magnesium: 2.1 mg/dL (ref 1.7–2.4)

## 2022-03-22 NOTE — Consult Note (Signed)
Palliative Medicine Inpatient Consult Note Consulting Provider: Dr. Reesa Wise  Reason for consult:   [facility] Palliative Medicine Consult  Reason for Consult? GOC, poor oral intake. May need Peg tube. Cortak in place.   03/22/2022  HPI:  Per intake H&P --> 81 year old with history of hypertension, hyperlipidemia, prostate cancer s/p radiation, seizure disorder, chronic hyponatremia, alcohol abuse (reportedly drinks 8 oz of Wise daily), tobacco use 1 pack/day smoker, right ICA aneurysm s/p coiling, and recent left corona radiata acute lacunar infarction on 9/16.  Admitted in the setting of AMS and now has ongoing encephalopathy thought to be r/t seizure/post ictal, postconcussion, acute stroke, subdural hematoma, alcohol withdrawal, phenobarbital effect. Palliative care has been asked to get involved in the setting of adult failure to thrive for further conversations on possible gastrostomy tube placement.   Clinical Assessment/Goals of Care:  *Please note that this is a verbal dictation therefore any spelling or grammatical errors are due to the "[employer] One" system interpretation.  I have reviewed medical records including EPIC notes, labs and imaging, received report from bedside RN, assessed the patient.    I met with Carlos Wise, his Carlos Wise, Carlos Wise, and his sisters, Carlos Wise and Carlos Wise joined in by speaker-phone to further discuss diagnosis prognosis, GOC, EOL wishes, disposition and options.   I introduced Palliative Medicine as specialized medical care for people living with serious illness. It focuses on providing relief from the symptoms and stress of a serious illness. The goal is to improve quality of life for both the patient and the family.  Medical History Review and Understanding:  A review of Carlos Wise's past medical history was held in the setting of his prior prostate cancer for which she received radiation, his hyponatremia, seizure disorder, alcohol abuse,  hypertension, and prior stroke.  Social History:  Carlos Wise lives in a single-family home and has 4 caregivers who work around-the-clock.  He had been married to his wife for 65 years prior to her passing away about a year ago.  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  He enjoys being outside and used to love mowing the yard.  He likes watching golf on TV.  He is a man of faith and practices within the [place] beliefs.  Functional and Nutritional State:  Prior to admission Carlos Wise was able to get around his home with a walker.  He does have caregivers throughout the day and if they ever have to leave that is from about 12 at night to 9 in the morning.  Patient's caregiver Carlos Wise is now making an effort to get cameras put up in his home for safety.    Carlos Wise has a variable appetite at baseline per his caregiver Carlos Wise she shares around 10 30-11 he will have 2 to 3 cigarettes in his coffee he will then decide to eat breakfast and he will have special K or Cheerios with yogurt and berries.  He will eat lunch around 1 30-2 30 which is usually about a half a sandwich and some chips.  He will then have a later snack in the day which is a cookie.  At night around 6 30-7 30 he will have a child-size hotdog.  He is known to not have a terribly robust appetite per those who care for him.  There is formal documentation of this dating back to March at his bedside.  Palliative Symptoms:  Muscular weakness-patient encouraged  to get out of bed and mobilize with assistance which she is willing to do.  He was able to walk with myself and his caregiver over 200 feet with a front wheel walker he maintained steadiness throughout.  Advance Directives:  A detailed discussion was had today regarding advanced directives.  Patient does have advanced directives on file and his healthcare power of attorney is Carlos Wise.  Code Status:  Concepts  specific to code status, artifical feeding and hydration, continued IV antibiotics and rehospitalization was had.  The difference between a aggressive medical intervention path  and a palliative comfort care path for this patient at this time was had.   Patient vocalizes very clearly his desire to live and when we spoke with he and his caregiver and sister about the idea of resuscitation or not resuscitation he said "hell yes" I would want to be resuscitated.  I asked him questions beyond that in terms of if he ever needed something like a tracheostomy or hemodialysis if he would desire those things.  This is where the conversation got a little bit more confusing for him which his sisters acknowledged.  He shared that the context of this was overwhelming and was clear at least at this time about his desire to live and receive chest compressions, shocks, ACLS meds, and intubation.  Patient's family vocalize that they know him and know that he probably would not want to be on artificial life support long-term though they will continue to discuss this with him when he leaves the hospital.  They shared that the patient was clearly becoming more frustrated and disoriented when discussing this topic specifically and they requested that we table this discussion until another day.  Discussion:  A greater focus of our conversation surrounded patient's poor nutritional state.  He got very upset and multiple times throughout the conversation she said "God damn it".  He was saying this in reference to what he feels is less forcing him to eat and drink when at baseline he tends to not eat and drink a tremendous amount.  He expresses that the nasogastric tube has caused him much greater aggravation than benefit and that he has frequent loose stools from it.  He also complains that is generally uncomfortable.  We talked about if he would ever want a long-term modality of feeding in his stomach which he vocalizes he would  not.  If the rest of his life was having frequent loose malodorous bowel movements he would not desire that.  We further discussed patient's caloric intake journals and how he really does not at baseline have a significantly large appetite though has maintained his weight.  He feels very strongly that if the nasogastric tube is out he may be more willing to eat and drink especially if food is brought in from the outside.  He, his sisters, and caregivers all agree that at this time we should remove the nasogastric tube and see how he fares.  Everyone agrees that he will be much happier this way and we may see great improvement in his overall demeanor.  We summarized the information from above the first point was were going to remove the nasogastric tube and allow patient to take and what he can on his own.  The second point was we are going to mobilize [place] multiple times a day.  The third point was having a reevaluation by the therapy teams to see if he may now be at a point where he  could transition home as opposed to needing to go to rehab.  Carlos Wise and I discussed that hers presents is important with these evaluations as he acts quite a bit differently around her thin around other healthcare providers.  Discussed the importance of continued conversation with family and their  medical providers regarding overall plan of care and treatment options, ensuring decisions are within the context of the patients values and GOCs.  Decision Maker: [place],(HCPOA) Carlos Wise (Other): [phone] (Mobile)  SUMMARY OF RECOMMENDATIONS   FULL CODE --> Patient is unsure of if he would want long-term support in terms of a tracheostomy or dialysis. Per his family he is not the type of man that would want to live life in a bed without being able to do anything for himself.  We discussed readdressing this topic at some point in the near future though family had asked that we stop the conversation given how upsetting it  was for [facility]  Patient does not want a gastrostomy tube placed in his stomach  We will remove the core track in an effort to see how much patient is able to eat on his own --> please refer to his side table which has 5 journals documenting his intake over the last several months  Appreciate Physical therapy and Occupational Therapy doing a reevaluation --> as patient was able to walk over 200 feet this afternoon  Code Status/Advance Care Planning: FULL CODE  Palliative Prophylaxis:  Aspiration, Bowel Regimen, Delirium Protocol, Frequent Pain Assessment, Oral Care, Palliative Wound Care, and Turn Reposition  Additional Recommendations (Limitations, Scope, Preferences): Full scope of treatment  Psycho-social/Spiritual:  Desire for further Chaplaincy support: No not presently Additional Recommendations: Education on adult failure to thrive and the long-term ramifications of this   Prognosis: Patient has multiple comorbidity risk factors and has been rehospitalized 3 times in the last 6 months.  Khristian is at very high 57-month mortality risk  Discharge Planning: Discharge plan uncertain as awaiting PT and OT reevaluations.  Vitals:   03/22/22 0434 03/22/22 0729  BP: (!) 110/57 (!) 153/75  Pulse: 64 74  Resp: 17 18  Temp: 97.8 F (36.6 C) 97.8 F (36.6 C)  SpO2: 97% 98%    Intake/Output Summary (Last 24 hours) at 03/22/2022 1108 Last data filed at 03/22/2022 9485 Gross per 24 hour  Intake 802.5 ml  Output 1300 ml  Net -497.5 ml   Last Weight  Most recent update: 03/22/2022  6:19 AM    Weight  76.4 kg (168 lb 6.9 oz)            Gen: Elderly Caucasian male in no acute distress HEENT: moist mucous membranes CV: Regular rate and rhythm PULM: On room air breathing is even and nonlabored ABD: soft/nontender EXT: No edema Neuro: Alert and oriented x2 - gets disoriented regarding his situation  PPS: 60%   This conversation/these recommendations were discussed with patient  primary care team, Dr. Reesa Wise  Total Time: 112  Billing based on MDM: High  Problems Addressed: One acute or chronic illness or injury that poses a threat to life or bodily function  Amount and/or Complexity of Data: Category 3:Discussion of management or test interpretation with external physician/other qualified health care professional/appropriate source (not separately reported)  Risks: Decision regarding hospitalization or escalation of hospital care and Decision not to resuscitate or to de-escalate care because of poor prognosis ______________________________________________________ [place] Team Team Cell Phone: [phone] Please utilize secure chat with additional questions, if  there is no response within 30 minutes please call the above phone number  Palliative Medicine Team providers are available by phone from 7am to 7pm daily and can be reached through the team cell phone.  Should this patient require assistance outside of these hours, please call the patient's attending physician.

## 2022-03-22 NOTE — Progress Notes (Signed)
PROGRESS NOTE    Carlos Wise  WUJ:[id number] DOB: June 01, 1941 DOA: 03/09/2022 PCP: [name], MD   Brief Narrative:  81 year old with history of hypertension, hyperlipidemia, prostate cancer s/p radiation, seizure disorder, chronic hyponatremia, alcohol abuse (reportedly drinks 8 oz of gin daily), tobacco use 1 pack/day smoker, right ICA aneurysm s/p coiling, and recent left corona radiata acute lacunar infarction on 9/16 after being evaluated for altered mental status , Carlos Wise caregiver heard a bang while cleaning downstairs and went to find Carlos Wise had fallen after getting out of bed. He was noted to be making an abnormal noise and had liquid foaming at the mouth for 1 to 2 minutes before resolution.  Per triage RN " Pt was showering when he had an unwitnessed fall" He is aphasic on presentation,  Found to have small subdural bleed.  NSGY evaluated, no further workup needed, he is admitted to ICU for airway watch and high risk EtOH w/d, treated with phenobarb in the ICU for alcohol withdrawal. He is  off phenobarb, maintaining airway, his transfer out of ICU on 9/28.  Cortrak in place, ongoing calorie count and slowly advancing diet.  Very poor oral intake.   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 intake during the day, nocturnal tube feeds, ongoing calorie count -s/p Repeat SS eval 10/3= D3 diet with full supervision.  Cortrak in place, ongoing calorie count in anticipation to remove feeding tube.  Calorie count to end on Monday.  I suspect patient will end up requiring some form of advanced feeding tube, PEG tube due to very poor oral intake.  Acute encephalopathy-likely multifactorial including seizure/post  ictal, postconcussion, acute stroke, subdural hematoma, alcohol withdrawal, phenobarbital effect ( he received phenobarb in the ICU for alcohol withdrawal) -EEG LTM-no seizure, moderate diffuse encephalopathy/nonspecific secondary to underlying structural abnormality.  Neurology had recommended Keppra     Small acute infarcts in the left pons and left corona radiata  -Likely due to small vessel disease.  MRI confirmed CVA.  Echocardiogram EF 60%, LDL 161, A1c 5.1.  Continue aspirin, Lipitor 80 mg   Hyponatremia; improved.  Resolved.     Alcohol abuse Off phenobarbital.  No longer showing signs of withdrawal.  Continue multivitamin, folic acid and thiamine      PT/OT-SNF Palliative care team consulted to help establish some goals of care  DVT prophylaxis: SCDs Start: 03/09/22 1629 Code Status: Full Family Communication: Sister-in-law at bedside  Ongoing calorie count and thereafter need to decide if we can proceed with PEG tube placement to facilitate his discharge to SNF.  Nutritional status    Signs/Symptoms: moderate fat depletion, severe muscle depletion  Interventions: Tube feeding, MVI  Body mass index is 26.38 kg/m.      Subjective:  Very poor overall and headache.  Refused his dinner yesterday.  Denies any chest pain, nausea, vomiting.  Examination: Constitutional: Not in acute distress Respiratory: Clear to auscultation bilaterally Cardiovascular: Normal sinus rhythm, no rubs Abdomen: Nontender nondistended good bowel sounds Musculoskeletal: No edema noted Skin: No rashes seen Neurologic: CN 2-12 grossly intact.  And nonfocal Psychiatric: Normal judgment and insight. Alert and oriented x 3. Normal mood.  Cortrak in place  Objective: Vitals:  03/21/22 2014 03/22/22 0434 03/22/22 0500 03/22/22 0729  BP: 121/76 (!) 110/57  (!) 153/75  Pulse: 76 64  74  Resp: '17 17  18  '$ Temp: 98.3 F (36.8 C) 97.8 F (36.6 C)  97.8 F (36.6 C)  TempSrc: Oral Oral   Oral  SpO2: 98% 97%  98%  Weight:   76.4 kg     Intake/Output Summary (Last 24 hours) at 03/22/2022 1030 Last data filed at 03/22/2022 0855 Gross per 24 hour  Intake 802.5 ml  Output 1300 ml  Net -497.5 ml   Filed Weights   03/20/22 0619 03/21/22 0500 03/22/22 0500  Weight: 80.7 kg 76.1 kg 76.4 kg     Data Reviewed:   CBC: Recent Labs  Lab 03/18/22 0300 03/19/22 0234 03/20/22 0210 03/21/22 0159 03/22/22 0300  WBC 8.8 9.1 9.8 9.9 8.6  HGB 12.4* 12.1* 12.5* 12.6* 12.2*  HCT 36.1* 33.5* 35.6* 36.5* 33.5*  MCV 93.8 91.0 91.3 92.6 90.5  PLT 271 285 301 312 563   Basic Metabolic Panel: Recent Labs  Lab 03/18/22 0300 03/19/22 0234 03/20/22 0210 03/21/22 0159 03/22/22 0300  NA 132* 128* 129* 130* 129*  K 4.3 4.0 4.3 3.8 4.1  CL 96* 94* 93* 94* 93*  CO2 '26 25 25 25 24  '$ GLUCOSE 115* 118* 93 128* 128*  BUN '22 22 21 18 20  '$ CREATININE 0.69 0.65 0.71 0.61 0.68  CALCIUM 8.9 9.0 9.7 9.3 9.2  MG 2.0 2.0 2.1 2.3 2.1   GFR: Estimated Creatinine Clearance: 68.9 mL/min (by C-G formula based on SCr of 0.68 mg/dL). Liver Function Tests: No results for input(s): "AST", "ALT", "ALKPHOS", "BILITOT", "PROT", "ALBUMIN" in the last 168 hours. No results for input(s): "LIPASE", "AMYLASE" in the last 168 hours. No results for input(s): "AMMONIA" in the last 168 hours. Coagulation Profile: No results for input(s): "INR", "PROTIME" in the last 168 hours. Cardiac Enzymes: No results for input(s): "CKTOTAL", "CKMB", "CKMBINDEX", "TROPONINI" in the last 168 hours. BNP (last 3 results) No results for input(s): "PROBNP" in the last 8760 hours. HbA1C: No results for input(s): "HGBA1C" in the last 72 hours. CBG: Recent Labs  Lab 03/21/22 1635 03/21/22 2010 03/21/22 2354 03/22/22 0430 03/22/22 0725  GLUCAP 123* 157* 117* 123* 128*   Lipid Profile: No results for input(s): "CHOL", "HDL", "LDLCALC", "TRIG", "CHOLHDL", "LDLDIRECT" in the last 72 hours. Thyroid Function Tests: No  results for input(s): "TSH", "T4TOTAL", "FREET4", "T3FREE", "THYROIDAB" in the last 72 hours. Anemia Panel: No results for input(s): "VITAMINB12", "FOLATE", "FERRITIN", "TIBC", "IRON", "RETICCTPCT" in the last 72 hours. Sepsis Labs: No results for input(s): "PROCALCITON", "LATICACIDVEN" in the last 168 hours.  No results found for this or any previous visit (from the past 240 hour(s)).        Radiology Studies: No results found.      Scheduled Meds:  aspirin  81 mg Per Tube Daily   atorvastatin  80 mg Per Tube Daily   cholecalciferol  1,000 Units Per Tube Daily   feeding supplement  237 mL Oral BID BM   feeding supplement (PROSource TF20)  60 mL Per Tube Daily   feeding supplement (VITAL 1.5 CAL)  1,000 mL Per Tube Q24H   fluticasone  1 spray Each Nare Daily   folic acid  1 mg Per Tube Daily   gabapentin  150 mg Per Tube QHS   levETIRAcetam  1,000 mg Per Tube BID   multivitamin with minerals  1 tablet Per Tube Daily   nicotine  21 mg Transdermal Daily   mouth rinse  15 mL Mouth Rinse 4 times per day   pantoprazole  40 mg Per Tube Daily   sodium chloride  1 g Per Tube TID   thiamine  100 mg Per Tube Daily   vitamin B-12  100 mcg Per Tube Daily   Continuous Infusions:     LOS: 13 days   Time spent= 35 mins    Chue Berkovich Arsenio Loader, MD [facility]  If 7PM-7AM, please contact night-coverage  03/22/2022, 10:30 AM

## 2022-03-23 ENCOUNTER — Ambulatory Visit: Admission: RE | Admit: 2022-03-23 | Payer: Medicare HMO | Source: Home / Self Care

## 2022-03-23 ENCOUNTER — Encounter: Admission: RE | Payer: Self-pay | Source: Home / Self Care

## 2022-03-23 LAB — BASIC METABOLIC PANEL
Anion gap: 11 (ref 5–15)
BUN: 22 mg/dL (ref 8–23)
CO2: 27 mmol/L (ref 22–32)
Calcium: 9.9 mg/dL (ref 8.9–10.3)
Chloride: 93 mmol/L — ABNORMAL LOW (ref 98–111)
Creatinine, Ser: 0.69 mg/dL (ref 0.61–1.24)
GFR, Estimated: >60 mL/min (ref >60)
Glucose, Bld: 94 mg/dL (ref 70–99)
Potassium: 4.9 mmol/L (ref 3.5–5.1)
Sodium: 131 mmol/L — ABNORMAL LOW (ref 135–145)

## 2022-03-23 LAB — CBC
HCT: 35.9 % — ABNORMAL LOW (ref 39.0–52.0)
Hemoglobin: 12.5 g/dL — ABNORMAL LOW (ref 13.0–17.0)
MCH: 32.1 pg (ref 26.0–34.0)
MCHC: 34.8 g/dL (ref 30.0–36.0)
MCV: 92.1 fL (ref 80.0–100.0)
Platelets: 318 10*3/uL (ref 150–400)
RBC: 3.9 MIL/uL — ABNORMAL LOW (ref 4.22–5.81)
RDW: 11.9 % (ref 11.5–15.5)
WBC: 11.6 10*3/uL — ABNORMAL HIGH (ref 4.0–10.5)
nRBC: 0 % (ref 0.0–0.2)

## 2022-03-23 LAB — MAGNESIUM: Magnesium: 2.2 mg/dL (ref 1.7–2.4)

## 2022-03-23 LAB — GLUCOSE, CAPILLARY
Glucose-Capillary: 93 mg/dL (ref 70–99)
Glucose-Capillary: 90 mg/dL (ref 70–99)

## 2022-03-23 SURGERY — ESOPHAGOGASTRODUODENOSCOPY (EGD) WITH PROPOFOL
Anesthesia: General

## 2022-03-23 MED ORDER — THIAMINE MONONITRATE 100 MG PO TABS
100.0000 mg | ORAL_TABLET | Freq: Every day | ORAL | Status: DC
  Administered 2022-03-23 – 2022-03-25 (×3): 100 mg via ORAL
  Filled 2022-03-23 (×3): qty 1

## 2022-03-23 MED ORDER — FOLIC ACID 1 MG PO TABS
1.0000 mg | ORAL_TABLET | Freq: Every day | ORAL | Status: DC
  Administered 2022-03-23 – 2022-03-25 (×3): 1 mg via ORAL
  Filled 2022-03-23 (×3): qty 1

## 2022-03-23 MED ORDER — VITAMIN B-12 100 MCG PO TABS
100.0000 ug | ORAL_TABLET | Freq: Every day | ORAL | Status: DC
  Administered 2022-03-23 – 2022-03-25 (×3): 100 ug via ORAL
  Filled 2022-03-23 (×3): qty 1

## 2022-03-23 MED ORDER — ASPIRIN 81 MG PO CHEW
81.0000 mg | CHEWABLE_TABLET | Freq: Every day | ORAL | Status: DC
  Administered 2022-03-23 – 2022-03-25 (×3): 81 mg via ORAL
  Filled 2022-03-23 (×3): qty 1

## 2022-03-23 MED ORDER — GABAPENTIN 250 MG/5ML PO SOLN
150.0000 mg | Freq: Every day | ORAL | Status: DC
  Administered 2022-03-23 – 2022-03-24 (×2): 150 mg via ORAL
  Filled 2022-03-23 (×4): qty 4

## 2022-03-23 MED ORDER — VITAMIN D 25 MCG (1000 UNIT) PO TABS
1000.0000 [IU] | ORAL_TABLET | Freq: Every day | ORAL | Status: DC
  Administered 2022-03-23 – 2022-03-25 (×3): 1000 [IU] via ORAL
  Filled 2022-03-23 (×3): qty 1

## 2022-03-23 MED ORDER — ADULT MULTIVITAMIN W/MINERALS CH
1.0000 | ORAL_TABLET | Freq: Every day | ORAL | Status: DC
  Administered 2022-03-23 – 2022-03-25 (×3): 1 via ORAL
  Filled 2022-03-23 (×3): qty 1

## 2022-03-23 MED ORDER — SODIUM CHLORIDE 1 G PO TABS
1.0000 g | ORAL_TABLET | Freq: Three times a day (TID) | ORAL | Status: DC
  Administered 2022-03-23 – 2022-03-25 (×7): 1 g via ORAL
  Filled 2022-03-23 (×9): qty 1

## 2022-03-23 MED ORDER — PANTOPRAZOLE 2 MG/ML SUSPENSION
40.0000 mg | Freq: Every day | ORAL | Status: DC
  Administered 2022-03-23 – 2022-03-25 (×3): 40 mg via ORAL
  Filled 2022-03-23 (×3): qty 20

## 2022-03-23 MED ORDER — POLYETHYLENE GLYCOL 3350 17 G PO PACK: 17.0000 g | PACK | Freq: Every day | ORAL | Status: DC | PRN

## 2022-03-23 MED ORDER — LEVETIRACETAM 100 MG/ML PO SOLN
1000.0000 mg | Freq: Two times a day (BID) | ORAL | Status: DC
  Administered 2022-03-23 – 2022-03-25 (×5): 1000 mg via ORAL
  Filled 2022-03-23 (×6): qty 10

## 2022-03-23 MED ORDER — ATORVASTATIN CALCIUM 80 MG PO TABS
80.0000 mg | ORAL_TABLET | Freq: Every day | ORAL | Status: DC
  Administered 2022-03-23 – 2022-03-25 (×3): 80 mg via ORAL
  Filled 2022-03-23 (×3): qty 1

## 2022-03-23 MED ORDER — DOCUSATE SODIUM 50 MG/5ML PO LIQD: 100.0000 mg | Freq: Two times a day (BID) | ORAL | Status: DC | PRN

## 2022-03-23 NOTE — Progress Notes (Signed)
Nutrition Follow-up  DOCUMENTATION CODES:   Non-severe (moderate) malnutrition in context of social or environmental circumstances  INTERVENTION:  - Discontinue calorie count.   - Continue Ensure Enlive po BID, each supplement provides 350 kcal and 20 grams of protein.  NUTRITION DIAGNOSIS:   Moderate Malnutrition related to social / environmental circumstances (EtOH abuse) as evidenced by moderate fat depletion, severe muscle depletion.  GOAL:   Patient will meet greater than or equal to 90% of their needs - progressing, PO intakes 25-50%   MONITOR:   Diet advancement, Labs, Weight trends, TF tolerance  REASON FOR ASSESSMENT:   Consult Poor PO  ASSESSMENT:   81 year old male who presented to the ED on 9/25 after an unwitnessed fall. PMH of R ICA aneurysm s/p coiling, stroke, HLD, HTN, EtOH abuse, tobacco abuse, seizures, prostate cancer s/p radiation therapy, esophageal dysphagia and stricture. Pt admitted with acute encephalopathy in setting of seizure activity, SDH.  Calorie count was started on the patient but has now been discontinued. Pt is on Dys 3 diet. MD states in note that pt does not want any form of feeding tube. Receipts were collected but difficult to get an average daily intake due to only 1-2 receipts from each day. Spoke with MD about discontinuing kcal count now since feeding tube no longer wanted. Pt is drinking 50% of Ensure. Ppt did eat 2 bowls of cereal this am and seems to be more alert per MD.   NUTRITION - FOCUSED PHYSICAL EXAM:  Flowsheet Row Most Recent Value  Orbital Region Moderate depletion  Upper Arm Region Moderate depletion  Thoracic and Lumbar Region Moderate depletion  Buccal Region Mild depletion  Temple Region Moderate depletion  Clavicle Bone Region Moderate depletion  Clavicle and Acromion Bone Region Moderate depletion  Scapular Bone Region Moderate depletion  Dorsal Hand Mild depletion  Patellar Region Moderate depletion   Anterior Thigh Region Severe depletion  Posterior Calf Region Severe depletion  Edema (RD Assessment) None  Hair Reviewed  Eyes Reviewed  Mouth Reviewed  Skin Reviewed  Nails Reviewed       Diet Order:   Diet Order             DIET DYS 3 Room service appropriate? Yes with Assist; Fluid consistency: Thin  Diet effective now                   EDUCATION NEEDS:   Not appropriate for education at this time  Skin:  Skin Assessment: Reviewed RN Assessment  Last BM:  03/22/22  Height:   Ht Readings from Last 1 Encounters:  02/28/22 '5\' 7"'$  (2.952 m)    Weight:   Wt Readings from Last 1 Encounters:  03/23/22 81.1 kg    Ideal Body Weight:  67.3 kg  BMI:  Body mass index is 28 kg/m.  Estimated Nutritional Needs:   Kcal:  1800-2000  Protein:  90-110  Fluid:  1.8-2L  Thalia Bloodgood, RD, LDN, CNSC

## 2022-03-23 NOTE — Progress Notes (Signed)
Physical Therapy Treatment Patient Details Name: Carlos Wise MRN: [id number] DOB: 1940/07/28 Today's Date: 03/23/2022   History of Present Illness Patient is a 81 year old male with a  history of prostate cancer status, hypertension, esophageal dysphagia and stricture, right ICA aneurysm status post coiling, seizure disorder, chronic hyponatremia, smoker, chronic daily alcohol user, and recent admission with left lacunar stroke, )who presented to the emergency department for evaluation status post unwitnessed fall with head strike. MRI revealed likely acute CVA L pons. Multifactorial acute encephalopathy. also noted to have a R scalp hematoma along with trace L convexity SAH, likely traumatic from fall. SAH is stable on repeat imaging.    PT Comments    Pt progressing well towards his physical therapy goals; overall is calm and cooperative throughout PT session. Pt requiring minimal assist for transfers and ambulating 150 ft with a walker at a min guard assist level. Does present as a high fall risk based on decreased gait speed, history of falls and decreased safety awareness. Recommend follow up HHPT and supervision with mobility at d/c.    Recommendations for follow up therapy are one component of a multi-disciplinary discharge planning process, led by the attending physician.  Recommendations may be updated based on patient status, additional functional criteria and insurance authorization.  Follow Up Recommendations  Home health PT Can patient physically be transported by private vehicle: Yes   Assistance Recommended at Discharge Frequent or constant Supervision/Assistance  Patient can return home with the following A little help with walking and/or transfers;A little help with bathing/dressing/bathroom;Assistance with cooking/housework;Assist for transportation;Help with stairs or ramp for entrance;Direct supervision/assist for medications management;Direct supervision/assist for financial  management   Equipment Recommendations  None recommended by PT    Recommendations for Other Services       Precautions / Restrictions Precautions Precautions: Fall Restrictions Weight Bearing Restrictions: No     Mobility  Bed Mobility               General bed mobility comments: up in chair upon arrival    Transfers Overall transfer level: Needs assistance Equipment used: Rolling walker (2 wheels) Transfers: Sit to/from Stand Sit to Stand: Min assist           General transfer comment: Cues for hand placement, light minA to rise and steady, tendency for initial posterior lean. MinA to control eccentric descent to chair    Ambulation/Gait Ambulation/Gait assistance: Min guard Gait Distance (Feet): 150 Feet Assistive device: Rolling walker (2 wheels) Gait Pattern/deviations: Step-through pattern, Decreased stride length, Shuffle Gait velocity: decreased Gait velocity interpretation: <1.8 ft/sec, indicate of risk for recurrent falls   General Gait Details: Pt with slow and steady pace, decreased bilateral foot clearance and heel strike at initial contact, min guard for safety. verbal cueing for walker proximity   Stairs             Wheelchair Mobility    Modified Rankin (Stroke Patients Only)       Balance Overall balance assessment: Needs assistance Sitting-balance support: Feet supported Sitting balance-Leahy Scale: Good     Standing balance support: Bilateral upper extremity supported, Reliant on assistive device for balance Standing balance-Leahy Scale: Poor Standing balance comment: reliant on UE support                            Cognition Arousal/Alertness: Awake/alert Behavior During Therapy: WFL for tasks assessed/performed Overall Cognitive Status: History of cognitive impairments - at  baseline                                 General Comments: Pt pleasant and cooperative        Exercises       General Comments        Pertinent Vitals/Pain Pain Assessment Pain Assessment: No/denies pain    Home Living                          Prior Function            PT Goals (current goals can now be found in the care plan section) Acute Rehab PT Goals Patient Stated Goal: go home Potential to Achieve Goals: Fair Progress towards PT goals: Progressing toward goals    Frequency    Min 3X/week      PT Plan Discharge plan needs to be updated    Co-evaluation              AM-PAC PT "6 Clicks" Mobility   Outcome Measure  Help needed turning from your back to your side while in a flat bed without using bedrails?: A Little Help needed moving from lying on your back to sitting on the side of a flat bed without using bedrails?: A Little Help needed moving to and from a bed to a chair (including a wheelchair)?: A Little Help needed standing up from a chair using your arms (e.g., wheelchair or bedside chair)?: A Little Help needed to walk in hospital room?: A Little Help needed climbing 3-5 steps with a railing? : A Lot 6 Click Score: 17    End of Session Equipment Utilized During Treatment: Gait belt Activity Tolerance: Patient tolerated treatment well Patient left: in chair;with call bell/phone within reach;with chair alarm set Nurse Communication: Mobility status PT Visit Diagnosis: Unsteadiness on feet (R26.81);Muscle weakness (generalized) (M62.81)     Time: 3500-9381 PT Time Calculation (min) (ACUTE ONLY): 12 min  Charges:  $Gait Training: 8-22 mins                     Carlos Wise, PT, DPT Acute Rehabilitation Services Office [phone]    Carlos Wise 03/23/2022, 11:53 AM

## 2022-03-23 NOTE — TOC Progression Note (Addendum)
Transition of Care [facility]) - Progression Note    Patient Details  Name: Carlos Wise MRN: [id number] Date of Birth: 1940-06-18  [facility] CM/SW Contact  Jacalyn Lefevre Edson Snowball, RN Phone Number: 03/23/2022, 11:59 AM  Clinical Narrative:     PT /OT updated recommendations to HHPT/OT. Patient has all DME.   NCN called [name] . [name] says she cannot have his caregivers in place until Wednesday , she can transport him home on Wednesday, she says his insurance will not pay for HHPT/OT , she wants to take him to [facility] in [place].   Called [facility] [phone] spoke to [employer], patient is currently active with them. They do not need a script or new referral or clinicals PT notes etc faxed to them       Expected Discharge Plan and Services           Expected Discharge Date: 03/23/22                                     Social Determinants of Health (SDOH) Interventions    Readmission Risk Interventions     No data to display

## 2022-03-23 NOTE — Progress Notes (Signed)
Patient was very rude and combative to this RN calling him SOB, making fist towards this RN when this RN was trying to change his gown. Patient educated that such behavior and language is unacceptable and if patient continue with such language this RN will leave and come back at a later time. Patient later stop using such language and became corporative. We continue to monitor.

## 2022-03-23 NOTE — Progress Notes (Signed)
PROGRESS NOTE    Carlos Wise  GGY:[id number] DOB: 12/26/1940 DOA: 03/09/2022 PCP: [name], MD   Brief Narrative:  81 year old with history of hypertension, hyperlipidemia, prostate cancer s/p radiation, seizure disorder, chronic hyponatremia, alcohol abuse (reportedly drinks 8 oz of gin daily), tobacco use 1 pack/day smoker, right ICA aneurysm s/p coiling, and recent left corona radiata acute lacunar infarction on 9/16 after being evaluated for altered mental status , Carlos Wise caregiver heard a bang while cleaning downstairs and went to find Carlos Wise had fallen after getting out of bed. He was noted to be making an abnormal noise and had liquid foaming at the mouth for 1 to 2 minutes before resolution.  Per triage RN " Pt was showering when he had an unwitnessed fall" He is aphasic on presentation,  Found to have small subdural bleed.  NSGY evaluated, no further workup needed, he is admitted to ICU for airway watch and high risk EtOH w/d, treated with phenobarb in the ICU for alcohol withdrawal. He is  off phenobarb, maintaining airway, his transfer out of ICU on 9/28.  Cortrak in place, ongoing calorie count and slowly advancing diet.  Very poor oral intake.  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 intake during the day, nocturnal tube feeds, ongoing calorie count -s/p Repeat SS eval 10/3= D3 diet with full supervision.  Palliative care met with the patient yesterday, patient is adamant about removing his cortrak and does not want any form of feeding tube.  At this time encourage oral diet.  Acute encephalopathy-likely multifactorial including seizure/post  ictal, postconcussion, acute stroke, subdural hematoma, alcohol withdrawal, phenobarbital effect ( he received phenobarb in the ICU for alcohol withdrawal) -EEG LTM-no seizure, moderate diffuse encephalopathy/nonspecific secondary to underlying structural abnormality.  Neurology had recommended Keppra     Small acute infarcts in the left pons and left corona radiata  -Likely due to small vessel disease.  MRI confirmed CVA.  Echocardiogram EF 60%, LDL 161, A1c 5.1.  Continue aspirin, Lipitor 80 mg   Hyponatremia; improved.  Resolved.     Alcohol abuse Off phenobarbital.  No longer showing signs of withdrawal.  Continue multivitamin, folic acid and thiamine   Goals of care -Seen by palliative care.  Patient wishes to be full code, does not want any form of advanced feeding tubes, cortrak removed on 10/8.    PT/OT-SNF, now improved therefore recommending HH  DVT prophylaxis: SCDs Start: 03/09/22 1629 Code Status: Full Family Communication: Family updated  At this time patient is medically stable for discharge but family needs 24-48 hours to get 24/7 supervision at home as patient requires at.  Nutritional status    Signs/Symptoms: moderate fat depletion, severe muscle depletion  Interventions: Tube feeding, MVI  Body mass index is 28 kg/m.      Subjective: Seen and examined at bedside, sitting up in the recliner.  No complaints   Examination: Constitutional: Not in acute distress Respiratory: Clear to auscultation bilaterally Cardiovascular: Normal sinus rhythm, no rubs Abdomen: Nontender nondistended good bowel sounds Musculoskeletal: No edema noted Skin: No rashes seen Neurologic: CN 2-12 grossly intact.  And nonfocal Psychiatric: Normal judgment  and insight. Alert and oriented x 3. Normal mood.    Objective: Vitals:   03/22/22 2109 03/23/22 0343 03/23/22 0454 03/23/22 0751  BP: (!) 142/88  123/69 127/68  Pulse: 84  68 72  Resp:    16  Temp: 98.2 F (36.8  C)  97.8 F (36.6 C) 98.3 F (36.8 C)  TempSrc: Oral  Oral Oral  SpO2: 99%  98% 99%  Weight:  81.1 kg      Intake/Output Summary (Last 24 hours) at 03/23/2022 0808 Last data filed at 03/22/2022 1729 Gross per 24 hour  Intake 375 ml  Output 500 ml  Net -125 ml   Filed Weights   03/21/22 0500 03/22/22 0500 03/23/22 0343  Weight: 76.1 kg 76.4 kg 81.1 kg     Data Reviewed:   CBC: Recent Labs  Lab 03/19/22 0234 03/20/22 0210 03/21/22 0159 03/22/22 0300 03/23/22 0037  WBC 9.1 9.8 9.9 8.6 11.6*  HGB 12.1* 12.5* 12.6* 12.2* 12.5*  HCT 33.5* 35.6* 36.5* 33.5* 35.9*  MCV 91.0 91.3 92.6 90.5 92.1  PLT 285 301 312 280 093   Basic Metabolic Panel: Recent Labs  Lab 03/19/22 0234 03/20/22 0210 03/21/22 0159 03/22/22 0300 03/23/22 0037  NA 128* 129* 130* 129* 131*  K 4.0 4.3 3.8 4.1 4.9  CL 94* 93* 94* 93* 93*  CO2 _0 GLUCOSE 118* 93 128* 128* 94  BUN _1 CREATININE 0.65 0.71 0.61 0.68 0.69  CALCIUM 9.0 9.7 9.3 9.2 9.9  MG 2.0 2.1 2.3 2.1 2.2   GFR: Estimated Creatinine Clearance: 75.1 mL/min (by C-G formula based on SCr of 0.69 mg/dL). Liver Function Tests: No results for input(s): "AST", "ALT", "ALKPHOS", "BILITOT", "PROT", "ALBUMIN" in the last 168 hours. No results for input(s): "LIPASE", "AMYLASE" in the last 168 hours. No results for input(s): "AMMONIA" in the last 168 hours. Coagulation Profile: No results for input(s): "INR", "PROTIME" in the last 168 hours. Cardiac Enzymes: No results for input(s): "CKTOTAL", "CKMB", "CKMBINDEX", "TROPONINI" in the last 168 hours. BNP (last 3 results) No results for input(s): "PROBNP" in the last 8760 hours. HbA1C: No results for input(s): "HGBA1C" in the last 72 hours. CBG: Recent Labs  Lab 03/22/22 0725 03/22/22 1151 03/22/22 2110 03/23/22 0256 03/23/22 0753  GLUCAP 128* 143* 100* 93 90   Lipid Profile: No results for input(s): "CHOL", "HDL", "LDLCALC", "TRIG", "CHOLHDL", "LDLDIRECT"  in the last 72 hours. Thyroid Function Tests: No results for input(s): "TSH", "T4TOTAL", "FREET4", "T3FREE", "THYROIDAB" in the last 72 hours. Anemia Panel: No results for input(s): "VITAMINB12", "FOLATE", "FERRITIN", "TIBC", "IRON", "RETICCTPCT" in the last 72 hours. Sepsis Labs: No results for input(s): "PROCALCITON", "LATICACIDVEN" in the last 168 hours.  No results found for this or any previous visit (from the past 240 hour(s)).        Radiology Studies: No results found.      Scheduled Meds:  aspirin  81 mg Per Tube Daily   atorvastatin  80 mg Per Tube Daily   cholecalciferol  1,000 Units Per Tube Daily   feeding supplement  237 mL Oral BID BM   fluticasone  1 spray Each Nare Daily   folic acid  1 mg Per Tube Daily   gabapentin  150 mg Per Tube QHS   levETIRAcetam  1,000 mg Per Tube BID   multivitamin with minerals  1 tablet Per Tube Daily   nicotine  21 mg Transdermal Daily   mouth rinse  15 mL Mouth Rinse 4 times per day   pantoprazole  40 mg Per Tube Daily   sodium chloride  1 g Per Tube TID   thiamine  100 mg Per Tube Daily   vitamin B-12  100 mcg Per Tube Daily   Continuous Infusions:     LOS: 14 days   Time spent= 35 mins    Carlos Wise Arsenio Loader, MD [facility]  If 7PM-7AM, please contact night-coverage  03/23/2022, 8:08 AM

## 2022-03-23 NOTE — Progress Notes (Signed)
Occupational Therapy Treatment Patient Details Name: Carlos Wise MRN: [id number] DOB: 1940-10-08 Today's Date: 03/23/2022   History of present illness Patient is a 81 year old male with a  history of prostate cancer status, hypertension, esophageal dysphagia and stricture, right ICA aneurysm status post coiling, seizure disorder, chronic hyponatremia, smoker, chronic daily alcohol user, and recent admission with left lacunar stroke, )who presented to the emergency department for evaluation status post unwitnessed fall with head strike. MRI revealed likely acute CVA L pons. Multifactorial acute encephalopathy. also noted to have a R scalp hematoma along with trace L convexity SAH, likely traumatic from fall. SAH is stable on repeat imaging.   OT comments  Patient continues to make steady progress towards goals in skilled OT session. Patient's session encompassed functional mobility, ADLs, and following 1 and 2 step commands. Patient with improved mentation, participation, and following commands in session (still requires extra time but is assumed back at his baseline). Patient able to complete ADLs seated at set up level (lower body dressing) and functional ambulation to complete household distances. OT upgrading recommendations to [facility] as patient has 24/7 assist and would benefit from being in home environment due to memory deficits. OT will continue to follow acutely.    Recommendations for follow up therapy are one component of a multi-disciplinary discharge planning process, led by the attending physician.  Recommendations may be updated based on patient status, additional functional criteria and insurance authorization.    Follow Up Recommendations  Home health OT    Assistance Recommended at Discharge Frequent or constant Supervision/Assistance  Patient can return home with the following  A little help with walking and/or transfers;A little help with bathing/dressing/bathroom;Assistance with  cooking/housework;Direct supervision/assist for medications management;Direct supervision/assist for financial management;Assist for transportation;Help with stairs or ramp for entrance   Equipment Recommendations  None recommended by OT (patient has DME needed)    Recommendations for Other Services      Precautions / Restrictions Precautions Precautions: Fall Restrictions Weight Bearing Restrictions: No       Mobility Bed Mobility               General bed mobility comments: up in chair upon arrival    Transfers Overall transfer level: Needs assistance Equipment used: Rolling walker (2 wheels) Transfers: Sit to/from Stand Sit to Stand: Min assist           General transfer comment: cues for hand placement with posterior lean initially, sitting down x1 in chair as he could not correct balance in time, (patient also standing pre-emptively therefore didnt have RW in front of him) able to complete sit<>stand appropriately and engage in functional mobility     Balance Overall balance assessment: Needs assistance Sitting-balance support: Feet supported Sitting balance-Leahy Scale: Good   Postural control: Posterior lean Standing balance support: Bilateral upper extremity supported, Reliant on assistive device for balance Standing balance-Leahy Scale: Poor Standing balance comment: reliant on UE support                           ADL either performed or assessed with clinical judgement   ADL Overall ADL's : Needs assistance/impaired                     Lower Body Dressing: Set up;Sitting/lateral leans Lower Body Dressing Details (indicate cue type and reason): figure 4 technique to don socks  Functional mobility during ADLs: Minimal assistance;Cueing for safety;Cueing for sequencing;Rolling walker (2 wheels) General ADL Comments: Patient with significant improvement in functional mobility, following commands, and ADLs.     Extremity/Trunk Assessment              Vision       Perception     Praxis      Cognition Arousal/Alertness: Awake/alert Behavior During Therapy: WFL for tasks assessed/performed Overall Cognitive Status: History of cognitive impairments - at baseline Area of Impairment: Attention, Memory, Following commands                   Current Attention Level: Sustained Memory: Decreased short-term memory Following Commands: Follows multi-step commands with increased time, Follows one step commands with increased time       General Comments: Significant improvement in mentation, affect, and participation to date. Good safety awareness throughout, very motivated and improved STM recall though still some minor impairments noted        Exercises      Shoulder Instructions       General Comments      Pertinent Vitals/ Pain       Pain Assessment Pain Assessment: No/denies pain  Home Living                                          Prior Functioning/Environment              Frequency  Min 2X/week        Progress Toward Goals  OT Goals(current goals can now be found in the care plan section)  Progress towards OT goals: Progressing toward goals  Acute Rehab OT Goals Patient Stated Goal: to go home OT Goal Formulation: With patient Time For Goal Achievement: 04/02/22 Potential to Achieve Goals: Good  Plan Discharge plan needs to be updated    Co-evaluation                 AM-PAC OT "6 Clicks" Daily Activity     Outcome Measure   Help from another person eating meals?: None Help from another person taking care of personal grooming?: A Little Help from another person toileting, which includes using toliet, bedpan, or urinal?: A Little Help from another person bathing (including washing, rinsing, drying)?: A Little Help from another person to put on and taking off regular upper body clothing?: A Little Help from another  person to put on and taking off regular lower body clothing?: A Little 6 Click Score: 19    End of Session Equipment Utilized During Treatment: Gait belt;Rolling walker (2 wheels)  OT Visit Diagnosis: Unsteadiness on feet (R26.81);Other abnormalities of gait and mobility (R26.89);Muscle weakness (generalized) (M62.81)   Activity Tolerance Patient tolerated treatment well   Patient Left in chair;with call bell/phone within reach;with chair alarm set   Nurse Communication Mobility status        Time: 7412-8786 OT Time Calculation (min): 17 min  Charges: OT General Charges $OT Visit: 1 Visit OT Treatments $Self Care/Home Management : 8-22 mins  Carlos Wise, OTR/L Acute Rehabilitation Services [phone]   [name] 03/23/2022, 11:23 AM

## 2022-03-23 NOTE — Care Management Important Message (Signed)
Important Message  Patient Details  Name: Carlos Wise MRN: [id number] Date of Birth: 08/12/1940   Medicare Important Message Given:  Yes     Hannah Beat 03/23/2022, 2:30 PM

## 2022-03-24 LAB — BASIC METABOLIC PANEL
Anion gap: 11 (ref 5–15)
BUN: 20 mg/dL (ref 8–23)
CO2: 26 mmol/L (ref 22–32)
Calcium: 9.3 mg/dL (ref 8.9–10.3)
Chloride: 95 mmol/L — ABNORMAL LOW (ref 98–111)
Creatinine, Ser: 0.82 mg/dL (ref 0.61–1.24)
GFR, Estimated: >60 mL/min (ref >60)
Glucose, Bld: 95 mg/dL (ref 70–99)
Potassium: 4.3 mmol/L (ref 3.5–5.1)
Sodium: 132 mmol/L — ABNORMAL LOW (ref 135–145)

## 2022-03-24 LAB — CBC
HCT: 34.7 % — ABNORMAL LOW (ref 39.0–52.0)
Hemoglobin: 12.1 g/dL — ABNORMAL LOW (ref 13.0–17.0)
MCH: 32.4 pg (ref 26.0–34.0)
MCHC: 34.9 g/dL (ref 30.0–36.0)
MCV: 93 fL (ref 80.0–100.0)
Platelets: 314 10*3/uL (ref 150–400)
RBC: 3.73 MIL/uL — ABNORMAL LOW (ref 4.22–5.81)
RDW: 12 % (ref 11.5–15.5)
WBC: 9.3 10*3/uL (ref 4.0–10.5)
nRBC: 0 % (ref 0.0–0.2)

## 2022-03-24 LAB — MAGNESIUM: Magnesium: 2.1 mg/dL (ref 1.7–2.4)

## 2022-03-24 NOTE — Progress Notes (Signed)
Mobility Specialist Progress Note   03/24/22 1050  Mobility  Activity Ambulated with assistance in hallway;Transferred from bed to chair (in bed before to chair after)  Level of Assistance Contact guard assist, steadying assist  Assistive Device Front wheel walker  Distance Ambulated (ft) 210 ft  Range of Motion/Exercises Active;All extremities  Activity Response Tolerated well   Patient received in supine agreeable to participate in mobility. Required minimal HHA to EOB from supine to sit and was min A to boost into standing. Ambulated min guard with slow steady gait. Returned to room without complaint or incident. Was left in recliner with all needs met, call bell in reach.   [place] Estel Scholze, [place], [place]  AWNOP:[phone] Office: [phone]

## 2022-03-24 NOTE — Progress Notes (Signed)
Speech Language Pathology Treatment: Dysphagia  Patient Details Name: Carlos Wise MRN: [id number] DOB: [birth year] Today's Date: 03/24/2022 Time: 0910-0936 SLP Time Calculation (min) (ACUTE ONLY): 26 min  Assessment / Plan / Recommendation Clinical Impression  Pt was seen with sips of thin liquids via straw, stating that he wanted to start by "clearing the pipe." He did have a big cough response to one of his first few sips of thin liquid - SLP questioned if it was due to him talking while still orally holding the bolus, but pt reported that it was related to "a lot of phlegm" that had been in his throat. He went on to consume more thin liquids as well as mixed boluses of thin liquids mixed with corn flakes with ongoing, intermittent oral holding and occasional throat clearing. Note that there was no aspiration observed on MBS even in the setting of coughing/wet vocal quality. Pt also reports that he does this at home every morning - coughing to clear phlegm until ultimately it subsides for the day. After several bites/sips, this did stop during his breakfast meal too. Pt appears to be at his baseline level of swallowing function. SLP to sign off acutely.    HPI HPI: Pt is an 81 yo male presenting after unwitnessed fall in the shower. CT Head showed 9m SDH along the L frontal convexity. MRI revealed likely acute CVA L pons and L corona radiata. MBS 01/08/22 with some oral holding and c/o globus sensation but with otherwise functional oropharyngeal swallow. speech/languag eveal 02/05/20 with cognitive impairment and severe aphasia with expressive/receptive deficits (wernicke's-like appearance) as well as possible motor planning impairment. PMH includes: esophageal dysphagia and stricture[facility], R ICA aneurysm s/p coiling, seizure disorder, smoker, chronic daily alcohol user, prostate ca s/p XRT, HTN, chronic hyponatremia      SLP Plan  All goals met       Recommendations for follow up therapy are one component of a multi-disciplinary discharge planning process, led by the attending physician.  Recommendations may be updated based on patient status, additional functional criteria and insurance authorization.    Recommendations  Diet recommendations: Dysphagia 3 (mechanical soft);Thin liquid Liquids provided via: Cup;Straw Medication Administration: Crushed with puree Supervision: Patient able to self feed;Intermittent supervision to cue for compensatory strategies Compensations: Slow rate;Small sips/bites;Minimize environmental distractions Postural Changes and/or Swallow Maneuvers: Seated upright 90 degrees                Oral Care Recommendations: Oral care BID Follow Up Recommendations: No SLP follow up Assistance recommended at discharge: Frequent or constant Supervision/Assistance SLP Visit Diagnosis: Dysphagia, oral phase (R13.11) Plan: All goals met           LOsie Bond, M.A. CNapier FieldOffice ([phone] Secure chat preferred   03/24/2022, 9:42 AM

## 2022-03-24 NOTE — Progress Notes (Signed)
PROGRESS NOTE    Carlos Wise  OIN:[id number] DOB: 04/14/1941 DOA: 03/09/2022 PCP: [name], MD   Brief Narrative:  81 year old with history of hypertension, hyperlipidemia, prostate cancer s/p radiation, seizure disorder, chronic hyponatremia, alcohol abuse (reportedly drinks 8 oz of gin daily), tobacco use 1 pack/day smoker, right ICA aneurysm s/p coiling, and recent left corona radiata acute lacunar infarction on 9/16 after being evaluated for altered mental status , Carlos Wise caregiver heard a bang while cleaning downstairs and went to find Carlos Wise had fallen after getting out of bed. He was noted to be making an abnormal noise and had liquid foaming at the mouth for 1 to 2 minutes before resolution.  Per triage RN " Pt was showering when he had an unwitnessed fall" He is aphasic on presentation,  Found to have small subdural bleed.  NSGY evaluated, no further workup needed, he is admitted to ICU for airway watch and high risk EtOH w/d, treated with phenobarb in the ICU for alcohol withdrawal. He is  off phenobarb, maintaining airway, his transfer out of ICU on 9/28.  Cortrak in place, ongoing calorie count and slowly advancing diet.  Very poor oral intake.  Eventually patient decided to remove his cortrak on 10/8. Currently patient is stable and family working on arranging caregivers at home for Wednesday.   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 intake during the day, nocturnal tube feeds, ongoing calorie count -s/p Repeat SS eval 10/3= D3 diet with full supervision.  Palliative care met with the patient yesterday, patient was overall adamant to remove his cortrak therefore it was removed.  Does not want any  form of advanced feeding tube.  Slowly tolerating oral.  He is not doing well with physical therapy therefore he will go home with home health.  Caregiver [name] is arranging for 24/7 home care which will be available starting Wednesday.  Acute encephalopathy-likely multifactorial including seizure/post ictal, postconcussion, acute stroke, subdural hematoma, alcohol withdrawal, phenobarbital effect ( he received phenobarb in the ICU for alcohol withdrawal) -EEG LTM-no seizure, moderate diffuse encephalopathy/nonspecific secondary to underlying structural abnormality.  Neurology had recommended Keppra     Small acute infarcts in the left pons and left corona radiata  -Likely due to small vessel disease.  MRI confirmed CVA.  Echocardiogram EF 60%, LDL 161, A1c 5.1.  Continue aspirin, Lipitor 80 mg   Hyponatremia; improved.  Resolved.     Alcohol abuse Off phenobarbital.  No longer showing signs of withdrawal.  Continue multivitamin, folic acid and thiamine   Goals of care -Seen by palliative care.  Patient wishes to be full code, does not want any form of advanced feeding tubes, cortrak removed on 10/8.    PT/OT-SNF, now improved therefore recommending HH  DVT prophylaxis: SCDs Start: 03/09/22 1629 Code Status: Full Family Communication: [name] updated  Family arranging 24/7 home care at home which will be available starting Wednesday.  Nutritional status    Signs/Symptoms: moderate fat depletion, severe muscle depletion  Interventions: Tube feeding, MVI  Body mass index is 28 kg/m.      Subjective: Seen and examined at bedside, attempting to eat his breakfast  Examination: Constitutional: Not in acute distress, chronically ill  Respiratory: Clear to auscultation bilaterally Cardiovascular: Normal sinus rhythm, no rubs Abdomen: Nontender nondistended good bowel sounds Musculoskeletal: No edema noted Skin: No rashes seen Neurologic: CN 2-12 grossly intact.  And  nonfocal Psychiatric: Normal judgment and insight. Alert and oriented x 3. Normal mood.  Objective: Vitals:   03/23/22 0454 03/23/22 0751 03/23/22 1949 03/24/22 0701  BP: 123/69 127/68 113/75 109/63  Pulse: 68 72 80 71  Resp:  $Remo'16 16 17  'llWdX$ Temp: 97.8 F (36.6 C) 98.3 F (36.8 C) 98.6 F (37 C) 98.2 F (36.8 C)  TempSrc: Oral Oral Oral Oral  SpO2: 98% 99% 100% 98%  Weight:        Intake/Output Summary (Last 24 hours) at 03/24/2022 0818 Last data filed at 03/23/2022 2002 Gross per 24 hour  Intake --  Output 500 ml  Net -500 ml   Filed Weights   03/21/22 0500 03/22/22 0500 03/23/22 0343  Weight: 76.1 kg 76.4 kg 81.1 kg     Data Reviewed:   CBC: Recent Labs  Lab 03/20/22 0210 03/21/22 0159 03/22/22 0300 03/23/22 0037 03/24/22 0025  WBC 9.8 9.9 8.6 11.6* 9.3  HGB 12.5* 12.6* 12.2* 12.5* 12.1*  HCT 35.6* 36.5* 33.5* 35.9* 34.7*  MCV 91.3 92.6 90.5 92.1 93.0  PLT 301 312 280 318 093   Basic Metabolic Panel: Recent Labs  Lab 03/20/22 0210 03/21/22 0159 03/22/22 0300 03/23/22 0037 03/24/22 0025  NA 129* 130* 129* 131* 132*  K 4.3 3.8 4.1 4.9 4.3  CL 93* 94* 93* 93* 95*  CO2 $Re'25 25 24 27 26  'wCE$ GLUCOSE 93 128* 128* 94 95  BUN $Re'21 18 20 22 20  'mnG$ CREATININE 0.71 0.61 0.68 0.69 0.82  CALCIUM 9.7 9.3 9.2 9.9 9.3  MG 2.1 2.3 2.1 2.2 2.1   GFR: Estimated Creatinine Clearance: 73.3 mL/min (by C-G formula based on SCr of 0.82 mg/dL). Liver Function Tests: No results for input(s): "AST", "ALT", "ALKPHOS", "BILITOT", "PROT", "ALBUMIN" in the last 168 hours. No results for input(s): "LIPASE", "AMYLASE" in the last 168 hours. No results for input(s): "AMMONIA" in the last 168 hours. Coagulation Profile: No results for input(s): "INR", "PROTIME" in the last 168 hours. Cardiac Enzymes: No results for input(s): "CKTOTAL", "CKMB", "CKMBINDEX", "TROPONINI" in the last 168 hours. BNP (last 3 results) No results for input(s): "PROBNP" in the last 8760 hours. HbA1C: No results  for input(s): "HGBA1C" in the last 72 hours. CBG: Recent Labs  Lab 03/22/22 0725 03/22/22 1151 03/22/22 2110 03/23/22 0256 03/23/22 0753  GLUCAP 128* 143* 100* 93 90   Lipid Profile: No results for input(s): "CHOL", "HDL", "LDLCALC", "TRIG", "CHOLHDL", "LDLDIRECT" in the last 72 hours. Thyroid Function Tests: No results for input(s): "TSH", "T4TOTAL", "FREET4", "T3FREE", "THYROIDAB" in the last 72 hours. Anemia Panel: No results for input(s): "VITAMINB12", "FOLATE", "FERRITIN", "TIBC", "IRON", "RETICCTPCT" in the last 72 hours. Sepsis Labs: No results for input(s): "PROCALCITON", "LATICACIDVEN" in the last 168 hours.  No results found for this or any previous visit (from the past 240 hour(s)).        Radiology Studies: No results found.      Scheduled Meds:  aspirin  81 mg Oral Daily   atorvastatin  80 mg Oral Daily   cholecalciferol  1,000 Units Oral Daily   feeding supplement  237 mL Oral BID BM   fluticasone  1 spray Each Nare Daily   folic acid  1 mg Oral Daily   gabapentin  150 mg Oral QHS   levETIRAcetam  1,000 mg Oral BID   multivitamin with minerals  1 tablet Oral Daily   nicotine  21 mg Transdermal Daily   mouth rinse  15 mL Mouth Rinse 4 times per day   pantoprazole  40 mg Oral Daily   sodium chloride  1 g Oral TID   thiamine  100 mg Oral Daily   vitamin B-12  100 mcg Oral Daily   Continuous Infusions:     LOS: 15 days   Time spent= 35 mins    Larina Lieurance Arsenio Loader, MD [facility]  If 7PM-7AM, please contact night-coverage  03/24/2022, 8:18 AM

## 2022-03-25 ENCOUNTER — Telehealth: Payer: Self-pay | Admitting: Nurse Practitioner

## 2022-03-25 ENCOUNTER — Other Ambulatory Visit (HOSPITAL_COMMUNITY): Payer: Self-pay

## 2022-03-25 DIAGNOSIS — Z7189 Other specified counseling: Secondary | ICD-10-CM | POA: Diagnosis not present

## 2022-03-25 DIAGNOSIS — Z515 Encounter for palliative care: Secondary | ICD-10-CM | POA: Diagnosis not present

## 2022-03-25 DIAGNOSIS — G9341 Metabolic encephalopathy: Secondary | ICD-10-CM | POA: Diagnosis not present

## 2022-03-25 MED ORDER — ATORVASTATIN CALCIUM 80 MG PO TABS
80.0000 mg | ORAL_TABLET | Freq: Every day | ORAL | 0 refills | Status: DC
  Filled 2022-03-25: qty 30, 30d supply, fill #0

## 2022-03-25 MED ORDER — LEVETIRACETAM 500 MG PO TABS
1000.0000 mg | ORAL_TABLET | Freq: Two times a day (BID) | ORAL | 0 refills | Status: DC
  Filled 2022-03-25: qty 120, 30d supply, fill #0

## 2022-03-25 NOTE — Progress Notes (Signed)
Assumed care of pt at this time. DC pending caregiver arrival.

## 2022-03-25 NOTE — Progress Notes (Signed)
   Palliative Medicine Inpatient Follow Up Note   HPI: 81 year old with history of hypertension, hyperlipidemia, prostate cancer s/p radiation, seizure disorder, chronic hyponatremia, alcohol abuse (reportedly drinks 8 oz of gin daily), tobacco use 1 pack/day smoker, right ICA aneurysm s/p coiling, and recent left corona radiata acute lacunar infarction on 9/16.  Admitted in the setting of AMS and now has ongoing encephalopathy thought to be r/t seizure/post ictal, postconcussion, acute stroke, subdural hematoma, alcohol withdrawal, phenobarbital effect. Palliative care has been asked to get involved in the setting of adult failure to thrive for further conversations on possible gastrostomy tube placement.   Today's Discussion 03/25/2022  *Please note that this is a verbal dictation therefore any spelling or grammatical errors are due to the "[place] [employer]" system interpretation.  Chart reviewed inclusive of vital signs, progress notes, laboratory results, and diagnostic images.   I met with Keenan Bachelor at bedside this morning.  He was noted to be resting comfortably in no acute distress. He was able to arouse to verbal stimuli though has some difficulty hearing.   I spoke to patient's healthcare power of attorney, [name]. We discussed patients present health state as it relates to his overall health. We reviewed that his intake is still quite poor though [name] is hopeful that this will pick up once he gets back into a familiar environment. [name] and I discussed ongoing support with OP Palliative care which she was in agreement with.   Questions and concerns addressed/Palliative Support Provided.   Objective Assessment: Vital Signs Vitals:   03/24/22 2114 03/25/22 0430  BP: 128/85 130/84  Pulse: 71 68  Resp: 17   Temp: 98.1 F (36.7 C)   SpO2: 99% 98%    Intake/Output Summary (Last 24 hours) at 03/25/2022 4037 Last data filed at 03/25/2022 0964 Gross per 24 hour  Intake --   Output 700 ml  Net -700 ml   Last Weight  Most recent update: 03/25/2022  6:28 AM    Weight  77.9 kg (171 lb 11.8 oz)            Gen: Elderly Caucasian male in no acute distress HEENT: moist mucous membranes CV: Regular rate and rhythm PULM: On room air breathing is even and nonlabored ABD: soft/nontender EXT: No edema Neuro: Drowsy this morning  SUMMARY OF RECOMMENDATIONS   FULL CODE  TOC - Outpatient Palliative Support  Plan for discharge later this morning  Billing based on MDM: Moderate ______________________________________________________________________________________ [place] Team Team Cell Phone: [phone] Please utilize secure chat with additional questions, if there is no response within 30 minutes please call the above phone number  Palliative Medicine Team providers are available by phone from 7am to 7pm daily and can be reached through the team cell phone.  Should this patient require assistance outside of these hours, please call the patient's attending physician.

## 2022-03-25 NOTE — Progress Notes (Signed)
Occupational Therapy Treatment Patient Details Name: Carlos Wise MRN: [id number] DOB: Feb 12, 1941 Today's Date: 03/25/2022   History of present illness Patient is a 81 year old male with a  history of prostate cancer status, hypertension, esophageal dysphagia and stricture, right ICA aneurysm status post coiling, seizure disorder, chronic hyponatremia, smoker, chronic daily alcohol user, and recent admission with left lacunar stroke, )who presented to the emergency department for evaluation status post unwitnessed fall with head strike. MRI revealed likely acute CVA L pons. Multifactorial acute encephalopathy. also noted to have a R scalp hematoma along with trace L convexity SAH, likely traumatic from fall. SAH is stable on repeat imaging.   OT comments  Patient found after chair exit sitting between end of bed and bed rails. Patient taking increased time to redirect, frustrated that his ride was not there to pick him up yet. Patient able to complete incremental scoots to [facility] with min A, and benefiting from OT calling caregiver to provided redirection. Handed off to caregiver at end of session, with caregiver assisting with dressing. OT will continue to follow.    Recommendations for follow up therapy are one component of a multi-disciplinary discharge planning process, led by the attending physician.  Recommendations may be updated based on patient status, additional functional criteria and insurance authorization.    Follow Up Recommendations  Home health OT    Assistance Recommended at Discharge Frequent or constant Supervision/Assistance  Patient can return home with the following  A little help with walking and/or transfers;A little help with bathing/dressing/bathroom;Assistance with cooking/housework;Direct supervision/assist for medications management;Direct supervision/assist for financial management;Assist for transportation;Help with stairs or ramp for entrance   Equipment  Recommendations  None recommended by OT (patient has DME needed)    Recommendations for Other Services      Precautions / Restrictions Precautions Precautions: Fall Restrictions Weight Bearing Restrictions: No       Mobility Bed Mobility Overal bed mobility: Needs Assistance             General bed mobility comments: sitting at end of bed upon arrival, at the bottom of the bed and the bed rails, chair alarm actively going off    Transfers Overall transfer level: Needs assistance   Transfers: Sit to/from Stand Sit to Stand: Min assist           General transfer comment: completing incremental squats to come towards [facility], handed off to caregiver to complete dressing for discharge     Balance Overall balance assessment: Needs assistance Sitting-balance support: Feet supported Sitting balance-Leahy Scale: Good     Standing balance support: Bilateral upper extremity supported, Reliant on assistive device for balance Standing balance-Leahy Scale: Poor                             ADL either performed or assessed with clinical judgement   ADL Overall ADL's : Needs assistance/impaired                                     Functional mobility during ADLs: Minimal assistance;Cueing for safety;Cueing for sequencing;Rolling walker (2 wheels)      Extremity/Trunk Assessment              Vision       Perception     Praxis      Cognition Arousal/Alertness: Awake/alert Behavior During Therapy: Agitated, Anxious Overall  Cognitive Status: History of cognitive impairments - at baseline                                 General Comments: Patient found after chair exit and sitting on EOB, frustrated that his ride wasnt here yet, and benefitting from calling caregiver to calm patient down        Exercises      Shoulder Instructions       General Comments      Pertinent Vitals/ Pain       Pain Assessment Pain  Assessment: Faces Faces Pain Scale: No hurt  Home Living                                          Prior Functioning/Environment              Frequency  Min 2X/week        Progress Toward Goals  OT Goals(current goals can now be found in the care plan section)  Progress towards OT goals: Progressing toward goals  Acute Rehab OT Goals Patient Stated Goal: to go home OT Goal Formulation: With patient Time For Goal Achievement: 04/02/22 Potential to Achieve Goals: Good  Plan Discharge plan remains appropriate    Co-evaluation                 AM-PAC OT "6 Clicks" Daily Activity     Outcome Measure   Help from another person eating meals?: None Help from another person taking care of personal grooming?: A Little Help from another person toileting, which includes using toliet, bedpan, or urinal?: A Little Help from another person bathing (including washing, rinsing, drying)?: A Little Help from another person to put on and taking off regular upper body clothing?: A Little Help from another person to put on and taking off regular lower body clothing?: A Little 6 Click Score: 19    End of Session    OT Visit Diagnosis: Unsteadiness on feet (R26.81);Other abnormalities of gait and mobility (R26.89);Muscle weakness (generalized) (M62.81)   Activity Tolerance Patient tolerated treatment well   Patient Left in bed;with call bell/phone within reach;Other (comment) (sitting EOB with caregiver present)   Nurse Communication Mobility status        Time: 5449-2010 OT Time Calculation (min): 10 min  Charges: OT General Charges $OT Visit: 1 Visit OT Treatments $Self Care/Home Management : 8-22 mins  Carlos Wise, OTR/L Acute Rehabilitation Services [phone]   [name] 03/25/2022, 1:14 PM

## 2022-03-25 NOTE — Progress Notes (Signed)
Physical Therapy Treatment Patient Details Name: Carlos Wise MRN: [id number] DOB: August 12, 1940 Today's Date: 03/25/2022   History of Present Illness Patient is a 81 year old male with a  history of prostate cancer status, hypertension, esophageal dysphagia and stricture, right ICA aneurysm status post coiling, seizure disorder, chronic hyponatremia, smoker, chronic daily alcohol user, and recent admission with left lacunar stroke, )who presented to the emergency department for evaluation status post unwitnessed fall with head strike. MRI revealed likely acute CVA L pons. Multifactorial acute encephalopathy. also noted to have a R scalp hematoma along with trace L convexity SAH, likely traumatic from fall. SAH is stable on repeat imaging.    PT Comments    Pt maintaining level of functional mobility. Requiring min assist for bed mobility/transfers and ambulating 150 ft with a walker at a min guard assist level. Presents as a high fall risk based on decreased gait speed (0.99 ft/s), history of falls and decreased safety awareness. Would benefit from follow up HHPT to address deficits and maximize functional mobility.    Recommendations for follow up therapy are one component of a multi-disciplinary discharge planning process, led by the attending physician.  Recommendations may be updated based on patient status, additional functional criteria and insurance authorization.  Follow Up Recommendations  Home health PT Can patient physically be transported by private vehicle: Yes   Assistance Recommended at Discharge Frequent or constant Supervision/Assistance  Patient can return home with the following A little help with walking and/or transfers;A little help with bathing/dressing/bathroom;Assistance with cooking/housework;Assist for transportation;Help with stairs or ramp for entrance;Direct supervision/assist for medications management;Direct supervision/assist for financial management   Equipment  Recommendations  None recommended by PT    Recommendations for Other Services       Precautions / Restrictions Precautions Precautions: Fall Restrictions Weight Bearing Restrictions: No     Mobility  Bed Mobility Overal bed mobility: Needs Assistance Bed Mobility: Supine to Sit     Supine to sit: Min assist     General bed mobility comments: MinA to pull trunk to upright    Transfers Overall transfer level: Needs assistance Equipment used: Rolling walker (2 wheels) Transfers: Sit to/from Stand Sit to Stand: Min assist           General transfer comment: Light minA to boost up to standing position    Ambulation/Gait Ambulation/Gait assistance: Min guard Gait Distance (Feet): 150 Feet Assistive device: Rolling walker (2 wheels) Gait Pattern/deviations: Step-through pattern, Decreased stride length, Shuffle Gait velocity: 0.99 ft/s Gait velocity interpretation: <1.31 ft/sec, indicative of household ambulator   General Gait Details: Shuffling gait pattern with decreased bilateral foot clearance. No overt LOB.   Stairs             Wheelchair Mobility    Modified Rankin (Stroke Patients Only)       Balance Overall balance assessment: Needs assistance Sitting-balance support: Feet supported Sitting balance-Leahy Scale: Good     Standing balance support: Bilateral upper extremity supported, Reliant on assistive device for balance Standing balance-Leahy Scale: Poor                              Cognition Arousal/Alertness: Awake/alert Behavior During Therapy: WFL for tasks assessed/performed Overall Cognitive Status: History of cognitive impairments - at baseline  General Comments: HOH, but follows commands        Exercises      General Comments        Pertinent Vitals/Pain Pain Assessment Pain Assessment: Faces Faces Pain Scale: Hurts a little bit Pain Location:  generalized Pain Descriptors / Indicators: Discomfort Pain Intervention(s): Monitored during session    Home Living                          Prior Function            PT Goals (current goals can now be found in the care plan section) Acute Rehab PT Goals Patient Stated Goal: go home Potential to Achieve Goals: Fair Progress towards PT goals: Progressing toward goals    Frequency    Min 3X/week      PT Plan Current plan remains appropriate    Co-evaluation              AM-PAC PT "6 Clicks" Mobility   Outcome Measure  Help needed turning from your back to your side while in a flat bed without using bedrails?: A Little Help needed moving from lying on your back to sitting on the side of a flat bed without using bedrails?: A Little Help needed moving to and from a bed to a chair (including a wheelchair)?: A Little Help needed standing up from a chair using your arms (e.g., wheelchair or bedside chair)?: A Little Help needed to walk in hospital room?: A Little Help needed climbing 3-5 steps with a railing? : A Lot 6 Click Score: 17    End of Session Equipment Utilized During Treatment: Gait belt Activity Tolerance: Patient tolerated treatment well Patient left: in chair;with call bell/phone within reach;with chair alarm set Nurse Communication: Mobility status PT Visit Diagnosis: Unsteadiness on feet (R26.81);Muscle weakness (generalized) (M62.81)     Time: 9476-5465 PT Time Calculation (min) (ACUTE ONLY): 11 min  Charges:  $Therapeutic Activity: 8-22 mins                     Carlos Wise, PT, DPT Acute Rehabilitation Services Office [phone]    Carlos Wise 03/25/2022, 12:06 PM

## 2022-03-25 NOTE — Progress Notes (Signed)
[place] [facility]) Hospital Liaison note:  Notified by Kendal Hymen of request for [facility] Palliative Care services. Will continue to follow for disposition.  Please call with any outpatient palliative questions or concerns.  Thank you for the opportunity to participate in this patient's care.  Thank you, Lorelee Market, LPN [facility] Liaison [phone]

## 2022-03-25 NOTE — TOC Progression Note (Signed)
Transition of Care [facility]) - Progression Note    Patient Details  Name: JARIN CORNFIELD MRN: [id number] Date of Birth: April 08, 1941  Transition of Care [facility]) CM/SW Contact  Loletha Grayer Beverely Pace, RN Phone Number: 03/25/2022, 10:54 AM  Clinical Narrative:    Case Manager spoke with patient's Care giver-Jennifer [name] [phone], concerning discharge needs. Patient's HH therapy has already been confirmed by previous Case Manager. CM confirmed that [name] is agreeable for OP Palliative care services, ans she is. Wants [facility]. CM called referral to Clementeen Hoof, Liaison with [facility].         Expected Discharge Plan and Services           Expected Discharge Date: 03/25/22                                     Social Determinants of Health (SDOH) Interventions    Readmission Risk Interventions     No data to display

## 2022-03-25 NOTE — Discharge Summary (Signed)
Physician Discharge Summary  Carlos Wise AOZ:[id number] DOB: 1940-07-15 DOA: 03/09/2022  PCP: [name], MD  Admit date: 03/09/2022 Discharge date: 03/25/2022  Admitted From: home Disposition:  home  Recommendations for Outpatient Follow-up:  Follow up with PCP in 1-2 weeks Please obtain BMP/CBC in one week  Home Health: PT Equipment/Devices: none  Discharge Condition: stable CODE STATUS: Full code  HPI: Per admitting MD, 81 year old man with hx of alcoholism, tobacco abuse, prior seizures presenting after being found down with seizure activity per daughter.  Found to have small subdural bleed.  Remains confused.  Similar admits w/ postictal states to other hospitals.  NSGY evaluated and reviewed scans, no further workup needed.  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  EEG LTM-no seizure, moderate diffuse encephalopathy/nonspecific secondary to underlying structural abnormality.  Neurology had recommended Keppra, and it appears that his dose has been increased to 1000 mg twice daily.  Continue   Active problems Adult failure to thrive -s/p Repeat SS eval 10/3= D3 diet with full supervision.  Patient initially had a core track, but was adamant to remove it.  He does not want any form of advanced feeding tube.  Now tolerating oral diet.  He is doing well with physical therapy, will be discharged home with home health. Caregiver  [name] is arranging for 24/7 home care which will be available starting the day of discharge. Small acute infarcts in the left pons and left corona radiata -Likely due to small vessel disease.  MRI confirmed CVA.  Echocardiogram EF 60%, LDL 161, A1c 5.1.  Continue aspirin, Lipitor 80 mg Hyponatremia-stable.  Alcohol abuse -initially requiring phenobarbital in the ICU.  No longer withdrawing  Sepsis ruled out  Discharge Instructions   Allergies as of 03/25/2022       Reactions   Penicillins Other (See Comments)   Patient states no allergy, but has preference that he does not want this medication        Medication List     TAKE these medications    Aleve PM 220-25 MG Tabs Generic drug: Naproxen Sod-diphenhydrAMINE Take 2 tablets by mouth at bedtime.   Allergy Eye Drops 0.025 % ophthalmic solution Generic drug: ketotifen Place 1 drop into both eyes daily as needed (red eyes).   aspirin EC 81 MG tablet Take 81 mg by mouth daily. Swallow whole.   atorvastatin 80 MG tablet Commonly known as: LIPITOR Take 1 tablet (80 mg total) by mouth daily. What changed:  medication strength how much to take   escitalopram 10 MG tablet Commonly known as: LEXAPRO Take 10 mg by mouth daily.   fluticasone 50 MCG/ACT nasal spray Commonly known as: FLONASE Place 1 spray into both nostrils daily.   folic acid 1 MG tablet Commonly known as: FOLVITE Take 1 mg by mouth daily.   gabapentin 300 MG capsule Commonly known as: NEURONTIN Take 300 mg by mouth at bedtime.   levETIRAcetam 500 MG tablet Commonly known as: KEPPRA Take 2 tablets (1,000 mg total) by mouth 2 (two) times daily. What changed:  how much to take   nicotine 14 mg/24hr patch Commonly known as: NICODERM CQ - dosed in mg/24 hours Place 1 patch (14 mg total) onto the skin daily.   NYQUIL PO Take 2 capsules by mouth at bedtime as needed (sleep when aleve pm is no working).   pantoprazole 40 MG tablet Commonly known  as: PROTONIX Take 40 mg by mouth daily.   sodium chloride 0.65 % Soln nasal spray Commonly known as: OCEAN Place 1 spray into both nostrils daily as needed for congestion.   sodium chloride 1 g tablet Take 1 g by mouth 3 (three) times daily.   thiamine 100 MG tablet Commonly known as: VITAMIN B1 Take 100 mg by mouth daily.   VITAMIN B-12 PO Take 1 tablet by mouth daily.   VITAMIN D3 PO Take 1 capsule by mouth daily.        Follow-up Information     [facility]. Schedule an appointment as soon as possible for a visit.   Contact information: [address], [place], [place], [address]   phone [phone]                Consultations: Palliative Neurology  Procedures/Studies:  MR ANGIO HEAD WO CONTRAST  Result Date: 03/11/2022 CLINICAL DATA:  81 year old male with seizure like activity. Recent lacunar type infarcts in the left frontal white matter and left pons. 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 EXAM: MRA HEAD WITHOUT CONTRAST TECHNIQUE: Angiographic images of the Circle of Willis were acquired using MRA technique without intravenous contrast. COMPARISON:  CTA head and neck 03/09/2022.  Brain MRI 03/10/2022. FINDINGS: Anterior circulation: Antegrade flow in both ICA siphons. Mild right ICA supraclinoid susceptibility artifact related to known stent and coil pack. No evidence of bilateral ICA siphon stenosis. No aneurysm flow signal. Patent carotid termini. Patent MCA and ACA origins. Dominant right ACA A1. Anterior communicating artery and visible ACA branches are within normal limits, the left A2 is dominant. Left MCA M1 segment and bifurcation appear patent without stenosis. Left MCA branches are stable and within normal limits. Right MCA M1 segment and bifurcation are patent without stenosis. Mild to moderate irregularity of the dominant right MCA M2 branch is stable from the recent CTA (series 253 image 11). Right  MCA branches appears stable. Posterior circulation: Antegrade flow in the posterior circulation with codominant distal vertebral arteries. Patent PICA origins and vertebrobasilar junction. Patent basilar artery is mildly tortuous without stenosis. Patent SCA and PCA origins. Posterior communicating arteries are diminutive or absent. Tortuous left P1 segment. Bilateral PCA branches are within normal limits. Anatomic variants: Dominant right ACA A1 and left ACA A2. Other: No intracranial mass effect or ventriculomegaly. IMPRESSION: 1. Negative for large vessel occlusion. No significant posterior circulation stenosis. 2. Anterior circulation atherosclerosis most pronounced at the dominant Right MCA M2 branch with moderate to severe stenosis there stable from recent CTA (series 253 image 11 today). 3. Satisfactory appearance of stent-assisted distal Right ICA aneurysm embolization. Electronically Signed   By: Genevie Ann M.D.   On: 03/11/2022 10:27   MR ANGIO NECK WO CONTRAST  Result Date: 03/11/2022 CLINICAL DATA:  81 year old male with seizure like activity. Recent lacunar type infarcts in the left frontal white matter and left pons. EXAM: MRA NECK WITHOUT CONTRAST TECHNIQUE: Angiographic images of the neck were acquired using MRA technique without intravenous contrast. Carotid stenosis measurements (when applicable) are obtained utilizing NASCET criteria, using the distal internal carotid diameter  as the denominator. COMPARISON:  Brain MRI 03/10/2022 and earlier. CTA head and neck 2 days ago on 03/09/2022. FINDINGS: Motion degraded exam despite repeated imaging attempts. Antegrade flow in both cervical carotid arteries to the skull base. Irregularity at both carotid bifurcations corresponding to recently demonstrated atherosclerosis and stenosis. But both vessels remain patent. Antegrade flow in both cervical vertebral arteries to the skull base. No significant extracranial vertebral artery stenosis on recent CTA.  IMPRESSION: Motion degraded exam with continued patency of the cervical carotid and vertebral arteries. Carotid atherosclerosis and stenosis was better demonstrated on the recent CTA. Electronically Signed   By: Genevie Ann M.D.   On: 03/11/2022 10:20   MR BRAIN WO CONTRAST  Result Date: 03/10/2022 CLINICAL DATA:  Evaluation is seizure like activity EXAM: MRI HEAD WITHOUT CONTRAST TECHNIQUE: Multiplanar, multiecho pulse sequences of the brain and surrounding structures were obtained without intravenous contrast. COMPARISON:  03/01/2022 MRI head correlation is also made with 03/09/2022 CT head FINDINGS: Brain: Restricted diffusion with possible ADC correlate in the left pons (series 6, images 19-20), which is new from the prior exam. Restricted diffusion in the left corona radiata is decreased in conspicuity compared to 03/01/2022, consistent with subacute infarct. No acute hemorrhage, mass, mass effect, or midline shift. No hydrocephalus or extra-axial collection. Confluent T2 hyperintense signal in the periventricular white matter, likely the sequela of severe chronic small vessel ischemic disease. Vascular: Normal arterial flow voids. Skull and upper cervical spine: Normal marrow signal. Right parietal scalp hematoma. Sinuses/Orbits: Mild mucosal thickening in the inferior maxillary sinuses and ethmoid air cells. No acute finding in the orbits. Other: Trace fluid in the left mastoid air cells. IMPRESSION: 1. Small focus of restricted diffusion in the left pons, new compared to 03/01/2022, likely an acute or early subacute infarct. 2. Other restricted diffusion in the left corona radiata is decreased in conspicuity compared to 03/01/2022 and consistent with evolution of the previously noted infarcts. These results will be called to the ordering clinician or representative by the Radiologist Assistant, and communication documented in the PACS or [employer]. Electronically Signed   By: Merilyn Baba M.D.   On:  03/10/2022 23:39   DG Abd Portable 1V  Result Date: 03/10/2022 CLINICAL DATA:  Feeding tube placement EXAM: PORTABLE ABDOMEN - 1 VIEW COMPARISON:  None Available. FINDINGS: Tip of feeding tube is seen in the region of distal antrum of the stomach. Bowel gas pattern is nonspecific. IMPRESSION: Tip of feeding tube is seen in the region of distal antrum of the stomach. Electronically Signed   By: Elmer Picker M.D.   On: 03/10/2022 12:28   Overnight EEG with video  Result Date: 03/10/2022 Lora Havens, MD     03/10/2022  3:39 PM Patient Name: Carlos Wise MRN: [id number] Epilepsy Attending: Lora Havens Referring Physician/Provider: Donnetta Simpers, MD Duration: 03/09/2022 1704 to 03/10/2022 1351 Patient history:  81 year old male with past medical history significant for alcohol and tobacco use, seizure disorder, chronic hyponatremia, hypertension, prostate cancer s/p radiation, right ACA aneurysm s/p coiling and recent left corona radiata lacunar infarction on 9/16 who presented to the ED for evaluation of seizure-like activity. EEG to evaluate for seizure Level of alertness:  lethargic AEDs during EEG study: Phenobarb Technical aspects: This EEG study was done with scalp electrodes positioned according to the 10-20 International system of electrode placement. Electrical activity was reviewed with band pass filter of 1-'70Hz'$ , sensitivity of 7 uV/mm, display speed of 61m/sec with a '60Hz'$  notched filter  applied as appropriate. EEG data were recorded continuously and digitally stored.  Video monitoring was available and reviewed as appropriate. Description: EEG showed continuous generalized and lateralized left hemisphere 3 to 7 Hz theta and delta slowing.  Additionally there was continuous low amplitude 2 to 3 Hz delta slowing in the right temporal region.  Hyperventilation and photic stimulation were not performed.   ABNORMALITY - Continuous slow, generalized and lateralized left hemisphere  -Continuous slow, right temporal region IMPRESSION: This study is suggestive of cortical dysfunction in left hemisphere likely secondary to underlying structural abnormality.  Additionally there is also cortical dysfunction in right temporal region likely secondary to underlying structural abnormality.  Lastly there is moderate diffuse encephalopathy, nonspecific etiology. No seizures or definite epileptiform discharges were seen throughout the recording. Priyanka Barbra Sarks   CT HEAD WO CONTRAST (5MM)  Result Date: 03/09/2022 CLINICAL DATA:  Stroke follow-up EXAM: CT HEAD WITHOUT CONTRAST TECHNIQUE: Contiguous axial images were obtained from the base of the skull through the vertex without intravenous contrast. RADIATION DOSE REDUCTION: This exam was performed according to the departmental dose-optimization program which includes automated exposure control, adjustment of the mA and/or kV according to patient size and/or use of iterative reconstruction technique. COMPARISON:  03/09/2022 CT head FINDINGS: Brain: No evidence of acute infarction, hemorrhage, mass, mass effect, or midline shift. No hydrocephalus. Previously noted 3 mm subdural collection along the left frontal convexity is decreased in conspicuity, now trace. Periventricular white matter changes, likely the sequela of chronic small vessel ischemic disease. Vascular: No hyperdense vessel. Right ICA stent and aneurysm coiling. Skull: Negative for fracture or focal lesion. Increased size of previously noted right temporoparietal scalp hematoma. Sinuses/Orbits: Mild mucosal thickening in the maxillary sinuses and ethmoid air cells. Other: The mastoid air cells are well aerated. IMPRESSION: 1. Decreased conspicuity of previously noted 3 mm subdural hematoma along the left frontal convexity, now trace. No additional acute intracranial process. 2. Increased size of previously noted right temporoparietal scalp hematoma. Electronically Signed   By: Merilyn Baba M.D.   On: 03/09/2022 23:44   CT ANGIO HEAD NECK W WO CM  Result Date: 03/09/2022 CLINICAL DATA:  Stroke suspected EXAM: CT ANGIOGRAPHY HEAD AND NECK TECHNIQUE: Multidetector CT imaging of the head and neck was performed using the standard protocol during bolus administration of intravenous contrast. Multiplanar CT image reconstructions and MIPs were obtained to evaluate the vascular anatomy. Carotid stenosis measurements (when applicable) are obtained utilizing NASCET criteria, using the distal internal carotid diameter as the denominator. RADIATION DOSE REDUCTION: This exam was performed according to the departmental dose-optimization program which includes automated exposure control, adjustment of the mA and/or kV according to patient size and/or use of iterative reconstruction technique. CONTRAST:  45m OMNIPAQUE IOHEXOL 350 MG/ML SOLN COMPARISON:  None Available. FINDINGS: CT HEAD FINDINGS Brain: Please see same-day noncontrast enhanced CT brain for additional findings. Vascular: See below Skull: Redemonstrated right parietal scalp laceration. Sinuses/Orbits: No acute finding. Other: None. Review of the MIP images confirms the above findings CTA NECK FINDINGS Aortic arch: Standard branching. Imaged portion shows no evidence of aneurysm or dissection. No significant stenosis of the major arch vessel origins. Right carotid system: Mild narrowing of the origin of the right internal carotid artery secondary to calcified atherosclerotic plaque. 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 No residual aneurysm identified. Left carotid system: No evidence of dissection, stenosis (50% or greater), or occlusion. Vertebral arteries: Codominant. No evidence of dissection, stenosis (50% or greater), or  occlusion. Skeleton: Negative. Other neck: Negative. Upper chest: Negative. Review of the MIP images confirms the above findings CTA HEAD FINDINGS Anterior circulation: No  significant stenosis, proximal occlusion, aneurysm, or vascular malformation. Redemonstrated stenting and coiling of the right cavernous carotid. No residual aneurysm identified. Posterior circulation: No significant stenosis, proximal occlusion, aneurysm, or vascular malformation. Venous sinuses: As permitted by contrast timing, patent. Anatomic variants: None Review of the MIP images confirms the above findings IMPRESSION: 1. No intracranial large vessel occlusion or no hemodynamically significant stenosis in the neck. 2. 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 No residual aneurysm identified. 3. Please see same-day noncontrast enhanced CT brain for additional findings, including a 3 mm left frontal convexity subdural hematoma. 4. Redemonstrated right parietal scalp soft tissue laceration. Electronically Signed   By: Marin Roberts M.D.   On: 03/09/2022 15:57   DG Chest Portable 1 View  Result Date: 03/09/2022 CLINICAL DATA:  Fusion.  Fall.  Head trauma EXAM: PORTABLE CHEST 1 VIEW COMPARISON:  None Available. FINDINGS: Normal mediastinum and cardiac silhouette. Normal pulmonary vasculature. No evidence of effusion, infiltrate, or pneumothorax. No acute bony abnormality. IMPRESSION: No acute cardiopulmonary process. Electronically Signed   By: Suzy Bouchard M.D.   On: 03/09/2022 14:46   CT HEAD WO CONTRAST (5MM)  Addendum Date: 03/09/2022   ADDENDUM REPORT: 03/09/2022 14:03 ADDENDUM: Discussed with Dr. Alvino Chapel on 03/09/22 at 2:03 PM Electronically Signed   By: Marin Roberts M.D.   On: 03/09/2022 14:03   Result Date: 03/09/2022 CLINICAL DATA:  Fall EXAM: CT HEAD WITHOUT CONTRAST TECHNIQUE: Contiguous axial images were obtained from the base of the skull through the vertex without intravenous contrast. RADIATION DOSE REDUCTION: This exam was performed according to the departmental dose-optimization program which includes automated exposure  control, adjustment of the mA and/or kV according to patient size and/or use of iterative reconstruction technique. COMPARISON:  CT head 03/01/2022 and MRI brain 03/01/2022 FINDINGS: Brain: Sequela of severe chronic microvascular ischemic change with chronic infarct in the left corona radiata. There is advanced generalized volume loss. There is a 3 mm subdural hematoma along the left frontal convexity size and shape of the ventricular system is unchanged from prior. No evidence of intraventricular blood products. No evidence of hydrocephalus. No midline shift. Vascular: No hyperdense vessel or unexpected calcification. Skull: There is a right temporoparietal scalp soft tissue injury without evidence of underlying fracture. Sinuses/Orbits: Mild mucosal thickening of the base of bilateral maxillary sinuses. Other: None. IMPRESSION: 1. 3 mm subdural hematoma along the left frontal convexity. 2. Soft tissue laceration over the right temporoparietal scalp without evidence of underlying calvarial fracture. Electronically Signed: By: Marin Roberts M.D. On: 03/09/2022 13:54   CT Cervical Spine Wo Contrast  Result Date: 03/09/2022 CLINICAL DATA:  Trauma EXAM: CT CERVICAL SPINE WITHOUT CONTRAST TECHNIQUE: Multidetector CT imaging of the cervical spine was performed without intravenous contrast. Multiplanar CT image reconstructions were also generated. RADIATION DOSE REDUCTION: This exam was performed according to the departmental dose-optimization program which includes automated exposure control, adjustment of the mA and/or kV according to patient size and/or use of iterative reconstruction technique. COMPARISON:  CT angio neck 03/01/2022 FINDINGS: Alignment: Straightening of cervical lordosis. Grade 1 anterolisthesis of C3 on C4. grade 1 anterolisthesis of C7 on T1. Multilevel severe facet degenerative changes. Skull base and vertebrae: No acute fracture. No primary bone lesion or focal pathologic process. Soft tissues  and spinal canal: No prevertebral fluid or swelling. No  visible canal hematoma. Disc levels: Multilevel degenerative disc disease with bone-on-bone articulation at C4-C5, C5-C6, and C6-C7. Upper chest: Paraseptal emphysema. Other: None IMPRESSION: No CT evidence of cervical spine fracture. Electronically Signed   By: Marin Roberts M.D.   On: 03/09/2022 14:01   ECHOCARDIOGRAM COMPLETE  Result Date: 03/02/2022    ECHOCARDIOGRAM REPORT   Patient Name:   NOA CONSTANTE Date of Exam: 03/02/2022 Medical Rec #:  [id number]    Height:       67.0 in Accession #:    [id number]   Weight:       170.6 lb Date of Birth:  01-05-1941    BSA:          1.890 m Patient Age:    17 years     BP:           146/73 mmHg Patient Gender: M            HR:           64 bpm. Exam Location:  Inpatient Procedure: 2D Echo, Cardiac Doppler and Color Doppler Indications:    Stroke  History:        Patient has prior history of Echocardiogram examinations, most                 recent 02/04/2020. Risk Factors:Hypertension and Dyslipidemia.  Sonographer:    Jefferey Pica Referring Phys: [id number] [place]  1. Left ventricular ejection fraction, by estimation, is 60 to 65%. The left ventricle has normal function. The left ventricle has no regional wall motion abnormalities. Left ventricular diastolic parameters are consistent with Grade I diastolic dysfunction (impaired relaxation). Elevated left ventricular end-diastolic pressure.  2. Right ventricular systolic function is normal. The right ventricular size is normal. Tricuspid regurgitation signal is inadequate for assessing PA pressure.  3. The mitral valve is normal in structure. No evidence of mitral valve regurgitation. No evidence of mitral stenosis.  4. The aortic valve is tricuspid. There is moderate calcification of the aortic valve. Aortic valve regurgitation is trivial. Aortic valve sclerosis/calcification is present, without any evidence of aortic stenosis. Aortic valve Vmax  measures 1.09 m/s.  5. Aortic dilatation noted. There is mild dilatation of the ascending aorta, measuring 43 mm.  6. The inferior vena cava is normal in size with greater than 50% respiratory variability, suggesting right atrial pressure of 3 mmHg. FINDINGS  Left Ventricle: Left ventricular ejection fraction, by estimation, is 60 to 65%. The left ventricle has normal function. The left ventricle has no regional wall motion abnormalities. The left ventricular internal cavity size was normal in size. There is  no left ventricular hypertrophy. Left ventricular diastolic parameters are consistent with Grade I diastolic dysfunction (impaired relaxation). Elevated left ventricular end-diastolic pressure. Right Ventricle: The right ventricular size is normal. No increase in right ventricular wall thickness. Right ventricular systolic function is normal. Tricuspid regurgitation signal is inadequate for assessing PA pressure. Left Atrium: Left atrial size was normal in size. Right Atrium: Right atrial size was normal in size. Pericardium: There is no evidence of pericardial effusion. Mitral Valve: The mitral valve is normal in structure. No evidence of mitral valve regurgitation. No evidence of mitral valve stenosis. Tricuspid Valve: The tricuspid valve is normal in structure. Tricuspid valve regurgitation is not demonstrated. No evidence of tricuspid stenosis. Aortic Valve: The aortic valve is tricuspid. There is moderate calcification of the aortic valve. Aortic valve regurgitation is trivial. Aortic valve sclerosis/calcification is present, without any evidence of  aortic stenosis. Aortic valve peak gradient measures 4.8 mmHg. Pulmonic Valve: The pulmonic valve was normal in structure. Pulmonic valve regurgitation is not visualized. No evidence of pulmonic stenosis. Aorta: Aortic dilatation noted. There is mild dilatation of the ascending aorta, measuring 43 mm. Venous: The inferior vena cava is normal in size with  greater than 50% respiratory variability, suggesting right atrial pressure of 3 mmHg. IAS/Shunts: No atrial level shunt detected by color flow Doppler.  LEFT VENTRICLE PLAX 2D LVIDd:         4.70 cm   Diastology LVIDs:         3.40 cm   LV e' medial:    6.90 cm/s LV PW:         1.00 cm   LV E/e' medial:  13.8 LV IVS:        0.90 cm   LV e' lateral:   6.00 cm/s LVOT diam:     2.00 cm   LV E/e' lateral: 15.9 LV SV:         94 LV SV Index:   50 LVOT Area:     3.14 cm  RIGHT VENTRICLE             IVC RV Basal diam:  2.70 cm     IVC diam: 1.80 cm RV S prime:     14.00 cm/s TAPSE (M-mode): 2.5 cm LEFT ATRIUM             Index LA diam:        3.30 cm 1.75 cm/m LA Vol (A2C):   31.9 ml 16.88 ml/m LA Vol (A4C):   27.3 ml 14.44 ml/m LA Biplane Vol: 30.1 ml 15.92 ml/m  AORTIC VALVE                 PULMONIC VALVE AV Area (Vmax): 3.60 cm     PV Vmax:       0.60 m/s AV Vmax:        109.00 cm/s  PV Peak grad:  1.5 mmHg AV Peak Grad:   4.8 mmHg LVOT Vmax:      125.00 cm/s LVOT Vmean:     80.800 cm/s LVOT VTI:       0.298 m  AORTA Ao Root diam: 3.30 cm Ao Asc diam:  4.30 cm MITRAL VALVE MV Area (PHT): 4.06 cm     SHUNTS MV Decel Time: 187 msec     Systemic VTI:  0.30 m MV E velocity: 95.50 cm/s   Systemic Diam: 2.00 cm MV A velocity: 108.00 cm/s MV E/A ratio:  0.88 Fransico Him MD Electronically signed by Fransico Him MD Signature Date/Time: 03/02/2022/8:49:36 AM    Final    CT ANGIO HEAD W OR WO CONTRAST  Result Date: 03/01/2022 CLINICAL DATA:  Acute neuro deficit.  Rule out stroke EXAM: CT ANGIOGRAPHY HEAD AND NECK TECHNIQUE: Multidetector CT imaging of the head and neck was performed using the standard protocol during bolus administration of intravenous contrast. Multiplanar CT image reconstructions and MIPs were obtained to evaluate the vascular anatomy. Carotid stenosis measurements (when applicable) are obtained utilizing NASCET criteria, using the distal internal carotid diameter as the denominator. RADIATION DOSE  REDUCTION: This exam was performed according to the departmental dose-optimization program which includes automated exposure control, adjustment of the mA and/or kV according to patient size and/or use of iterative reconstruction technique. CONTRAST:  78m OMNIPAQUE IOHEXOL 350 MG/ML SOLN COMPARISON:  CT head 02/28/2022.  MRI head 03/01/2022 FINDINGS: CT HEAD FINDINGS Brain:  Generalized atrophy. Moderate white matter hypodensity bilaterally. Small acute infarct in the left periventricular white matter best seen on diffusion-weighted imaging. Negative for hemorrhage or mass.  No acute cortical infarct. Vascular: Negative for hyperdense vessel. Stenting and coiling right cavernous carotid. Skull: Negative Sinuses/Orbits: Paranasal sinuses clear.  Negative orbit Other: None Review of the MIP images confirms the above findings CTA NECK FINDINGS Aortic arch: Standard branching. Imaged portion shows no evidence of aneurysm or dissection. No significant stenosis of the major arch vessel origins. Atherosclerotic calcification aortic arch and proximal great vessels. Right carotid system: Atherosclerotic calcification right carotid bifurcation. 50% diameter stenosis right common carotid artery just below the bifurcation. Scattered atherosclerotic disease right internal carotid artery without significant stenosis. Left carotid system: Atherosclerotic disease left carotid bifurcation without significant stenosis. Vertebral arteries: Both vertebral arteries are patent to the skull base without significant stenosis Skeleton: Moderate cervical spondylosis with foraminal narrowing bilaterally due to spurring. No acute skeletal abnormality. Other neck: Negative for mass or adenopathy in the neck. Upper chest: Lung apices clear bilaterally. Review of the MIP images confirms the above findings CTA HEAD FINDINGS Anterior circulation: Atherosclerotic calcification in the cavernous carotid bilaterally without stenosis. Right cavernous  carotid stenting and coiling. Coiled aneurysm medial cavernous carotid does not fill with contrast. Anterior and middle cerebral arteries widely patent without stenosis or large vessel occlusion. Mild stenosis distal right M1 segment. No aneurysm identified. Posterior circulation: Both vertebral arteries patent to the basilar. PICA patent bilaterally. Basilar patent. Superior cerebellar and posterior cerebral arteries patent. No large vessel occlusion or aneurysm. Venous sinuses: Normal venous enhancement Anatomic variants: None Review of the MIP images confirms the above findings IMPRESSION: 1. CT head negative for acute hemorrhage. 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 Small acute infarct left centrum semiovale best seen on MRI. 2. Negative for intracranial large vessel occlusion. 3. 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 No residual aneurysm. 4. 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 Right internal carotid artery diffusely disease without significant stenosis 5. Atherosclerotic disease left carotid bifurcation without significant stenosis 6. No significant vertebral artery stenosis. Electronically Signed   By: Franchot Gallo M.D.   On: 03/01/2022 17:43   CT ANGIO NECK W OR WO CONTRAST  Result Date: 03/01/2022 CLINICAL DATA:  Acute neuro deficit.  Rule out stroke EXAM: CT ANGIOGRAPHY HEAD AND NECK TECHNIQUE: Multidetector CT imaging of the head and neck was performed using the standard protocol during bolus administration of intravenous contrast. Multiplanar CT image reconstructions and MIPs were obtained to evaluate the vascular anatomy. Carotid stenosis measurements (when applicable) are obtained utilizing NASCET criteria, using the distal internal carotid diameter as the denominator. RADIATION DOSE REDUCTION: This exam was performed according to the departmental dose-optimization  program which includes automated exposure control, adjustment of the mA and/or kV according to patient size and/or use of iterative reconstruction technique. CONTRAST:  61m OMNIPAQUE IOHEXOL 350 MG/ML SOLN COMPARISON:  CT head 02/28/2022.  MRI head 03/01/2022 FINDINGS: CT HEAD FINDINGS Brain: Generalized atrophy. Moderate white matter hypodensity bilaterally. Small acute infarct in the left periventricular white matter best seen on diffusion-weighted imaging. Negative for hemorrhage or mass.  No acute cortical infarct. Vascular: Negative for hyperdense vessel. Stenting and coiling right cavernous carotid. Skull: Negative Sinuses/Orbits: Paranasal sinuses clear.  Negative orbit Other: None Review of the MIP images confirms the above findings CTA NECK FINDINGS Aortic arch: Standard branching. Imaged portion shows no evidence of aneurysm  or dissection. No significant stenosis of the major arch vessel origins. Atherosclerotic calcification aortic arch and proximal great vessels. Right carotid system: Atherosclerotic calcification right carotid bifurcation. 50% diameter stenosis right common carotid artery just below the bifurcation. Scattered atherosclerotic disease right internal carotid artery without significant stenosis. Left carotid system: Atherosclerotic disease left carotid bifurcation without significant stenosis. Vertebral arteries: Both vertebral arteries are patent to the skull base without significant stenosis Skeleton: Moderate cervical spondylosis with foraminal narrowing bilaterally due to spurring. No acute skeletal abnormality. Other neck: Negative for mass or adenopathy in the neck. Upper chest: Lung apices clear bilaterally. Review of the MIP images confirms the above findings CTA HEAD FINDINGS Anterior circulation: Atherosclerotic calcification in the cavernous carotid bilaterally without stenosis. Right cavernous carotid stenting and coiling. Coiled aneurysm medial cavernous carotid does not fill  with contrast. Anterior and middle cerebral arteries widely patent without stenosis or large vessel occlusion. Mild stenosis distal right M1 segment. No aneurysm identified. Posterior circulation: Both vertebral arteries patent to the basilar. PICA patent bilaterally. Basilar patent. Superior cerebellar and posterior cerebral arteries patent. No large vessel occlusion or aneurysm. Venous sinuses: Normal venous enhancement Anatomic variants: None Review of the MIP images confirms the above findings IMPRESSION: 1. CT head negative for acute hemorrhage. 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 Small acute infarct left centrum semiovale best seen on MRI. 2. Negative for intracranial large vessel occlusion. 3. 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 No residual aneurysm. 4. 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 Right internal carotid artery diffusely disease without significant stenosis 5. Atherosclerotic disease left carotid bifurcation without significant stenosis 6. No significant vertebral artery stenosis. Electronically Signed   By: Franchot Gallo M.D.   On: 03/01/2022 17:43   MR BRAIN WO CONTRAST  Result Date: 03/01/2022 CLINICAL DATA:  80 year old male with TIA. EXAM: MRI HEAD WITHOUT CONTRAST TECHNIQUE: Multiplanar, multiecho pulse sequences of the brain and surrounding structures were obtained without intravenous contrast. COMPARISON:  Head CT 02/28/2022.  Brain MRI 11/23/2021. FINDINGS: Brain: New 15 mm curvilinear area of restricted diffusion in the anterior to mid left corona radiata tracking toward the left external capsule (series 13, image 44). No other restricted diffusion. Stable cerebral volume. No midline shift, mass effect, evidence of mass lesion, extra-axial collection or acute intracranial hemorrhage. Cervicomedullary junction and pituitary are within normal limits. Confluent  bilateral periventricular white matter T2 and FLAIR hyperintensity. Patchy additional chronic signal changes in the bilateral corona radiata, bilateral deep gray matter nuclei. Moderate similar signal heterogeneity in the central pons. No chronic cerebral blood products. Vascular: Major intracranial vascular flow voids are stable since June. Generalized intracranial artery tortuosity. Skull and upper cervical spine: Chronic cervical spine degeneration with up to mild visible spinal stenosis on series 7, image 14. Visualized bone marrow signal is within normal limits. Sinuses/Orbits: Stable.  Mild paranasal sinus mucosal thickening. Other: Mastoids remain well aerated. Grossly normal visible internal auditory structures. Negative visible scalp soft tissues. IMPRESSION: 1. 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 No associated hemorrhage or mass effect. 2. Otherwise stable noncontrast MRI appearance of the brain since June with moderately advanced signal changes in the white matter and pons suspected due to small vessel disease. Electronically Signed   By: Genevie Ann M.D.   On: 03/01/2022 12:50   CT HEAD WO CONTRAST  Result Date: 02/28/2022 CLINICAL DATA:  Weakness, altered level of consciousness EXAM: CT HEAD WITHOUT  CONTRAST TECHNIQUE: Contiguous axial images were obtained from the base of the skull through the vertex without intravenous contrast. RADIATION DOSE REDUCTION: This exam was performed according to the departmental dose-optimization program which includes automated exposure control, adjustment of the mA and/or kV according to patient size and/or use of iterative reconstruction technique. COMPARISON:  11/22/2021, 11/23/2021 FINDINGS: Brain: Stable hypodensities throughout the periventricular white matter consistent with chronic small vessel ischemic change. No signs of acute infarct or hemorrhage. Lateral ventricles and midline structures are grossly unremarkable. No acute extra-axial fluid  collections. No mass effect. Vascular: Likely stent in the right ICA distribution. Stable atherosclerosis. No hyperdense vessel. Skull: Normal. Negative for fracture or focal lesion. Sinuses/Orbits: No acute finding. Other: None. IMPRESSION: 1. No acute intracranial process. Stable chronic small-vessel ischemic changes throughout the white matter. Electronically Signed   By: Randa Ngo M.D.   On: 02/28/2022 17:23     Subjective: - no chest pain, shortness of breath, no abdominal pain, nausea or vomiting.   Discharge Exam: BP (!) 142/79 (BP Location: Right Arm)   Pulse 71   Temp 97.7 F (36.5 C) (Oral)   Resp 18   Wt 77.9 kg   SpO2 100%   BMI 26.90 kg/m   General: Pt is alert, awake, not in acute distress Cardiovascular: RRR, S1/S2 +, no rubs, no gallops Respiratory: CTA bilaterally, no wheezing, no rhonchi Abdominal: Soft, NT, ND, bowel sounds + Extremities: no edema, no cyanosis    The results of significant diagnostics from this hospitalization (including imaging, microbiology, ancillary and laboratory) are listed below for reference.     Microbiology: No results found for this or any previous visit (from the past 240 hour(s)).   Labs: Basic Metabolic Panel: Recent Labs  Lab 03/20/22 0210 03/21/22 0159 03/22/22 0300 03/23/22 0037 03/24/22 0025  NA 129* 130* 129* 131* 132*  K 4.3 3.8 4.1 4.9 4.3  CL 93* 94* 93* 93* 95*  CO2 '25 25 24 27 26  '$ GLUCOSE 93 128* 128* 94 95  BUN '21 18 20 22 20  '$ CREATININE 0.71 0.61 0.68 0.69 0.82  CALCIUM 9.7 9.3 9.2 9.9 9.3  MG 2.1 2.3 2.1 2.2 2.1   Liver Function Tests: No results for input(s): "AST", "ALT", "ALKPHOS", "BILITOT", "PROT", "ALBUMIN" in the last 168 hours. CBC: Recent Labs  Lab 03/20/22 0210 03/21/22 0159 03/22/22 0300 03/23/22 0037 03/24/22 0025  WBC 9.8 9.9 8.6 11.6* 9.3  HGB 12.5* 12.6* 12.2* 12.5* 12.1*  HCT 35.6* 36.5* 33.5* 35.9* 34.7*  MCV 91.3 92.6 90.5 92.1 93.0  PLT 301 312 280 318 314    CBG: Recent Labs  Lab 03/22/22 0725 03/22/22 1151 03/22/22 2110 03/23/22 0256 03/23/22 0753  GLUCAP 128* 143* 100* 93 90   Hgb A1c No results for input(s): "HGBA1C" in the last 72 hours. Lipid Profile No results for input(s): "CHOL", "HDL", "LDLCALC", "TRIG", "CHOLHDL", "LDLDIRECT" in the last 72 hours. Thyroid function studies No results for input(s): "TSH", "T4TOTAL", "T3FREE", "THYROIDAB" in the last 72 hours.  Invalid input(s): "FREET3" Urinalysis    Component Value Date/Time   COLORURINE YELLOW 03/09/2022 1708   APPEARANCEUR CLEAR 03/09/2022 1708   LABSPEC <1.005 (L) 03/09/2022 1708   PHURINE 7.0 03/09/2022 1708   GLUCOSEU NEGATIVE 03/09/2022 1708   HGBUR TRACE (A) 03/09/2022 1708   BILIRUBINUR NEGATIVE 03/09/2022 1708   KETONESUR NEGATIVE 03/09/2022 1708   PROTEINUR NEGATIVE 03/09/2022 1708   NITRITE NEGATIVE 03/09/2022 1708   LEUKOCYTESUR LARGE (A) 03/09/2022 1708    FURTHER  DISCHARGE INSTRUCTIONS:   Get Medicines reviewed and adjusted: Please take all your medications with you for your next visit with your Primary MD   Laboratory/radiological data: Please request your Primary MD to go over all hospital tests and procedure/radiological results at the follow up, please ask your Primary MD to get all Hospital records sent to his/her office.   In some cases, they will be blood work, cultures and biopsy results pending at the time of your discharge. Please request that your primary care M.D. goes through all the records of your hospital data and follows up on these results.   Also Note the following: If you experience worsening of your admission symptoms, develop shortness of breath, life threatening emergency, suicidal or homicidal thoughts you must seek medical attention immediately by calling 911 or calling your MD immediately  if symptoms less severe.   You must read complete instructions/literature along with all the possible adverse reactions/side effects  for all the Medicines you take and that have been prescribed to you. Take any new Medicines after you have completely understood and accpet all the possible adverse reactions/side effects.    Do not drive when taking Pain medications or sleeping medications (Benzodaizepines)   Do not take more than prescribed Pain, Sleep and Anxiety Medications. It is not advisable to combine anxiety,sleep and pain medications without talking with your primary care practitioner   Special Instructions: If you have smoked or chewed Tobacco  in the last 2 yrs please stop smoking, stop any regular Alcohol  and or any Recreational drug use.   Wear Seat belts while driving.   Please note: You were cared for by a hospitalist during your hospital stay. Once you are discharged, your primary care physician will handle any further medical issues. Please note that NO REFILLS for any discharge medications will be authorized once you are discharged, as it is imperative that you return to your primary care physician (or establish a relationship with a primary care physician if you do not have one) for your post hospital discharge needs so that they can reassess your need for medications and monitor your lab values.  Time coordinating discharge: 40 minutes  SIGNED:  Marzetta Board, MD, PhD 03/25/2022, 9:32 AM

## 2022-04-01 DIAGNOSIS — Z79899 Other long term (current) drug therapy: Secondary | ICD-10-CM | POA: Diagnosis not present

## 2022-04-01 DIAGNOSIS — Z8673 Personal history of transient ischemic attack (TIA), and cerebral infarction without residual deficits: Secondary | ICD-10-CM | POA: Diagnosis not present

## 2022-04-01 DIAGNOSIS — H6121 Impacted cerumen, right ear: Secondary | ICD-10-CM | POA: Diagnosis not present

## 2022-04-01 DIAGNOSIS — Z23 Encounter for immunization: Secondary | ICD-10-CM | POA: Diagnosis not present

## 2022-04-01 DIAGNOSIS — R69 Illness, unspecified: Secondary | ICD-10-CM | POA: Diagnosis not present

## 2022-04-01 DIAGNOSIS — E871 Hypo-osmolality and hyponatremia: Secondary | ICD-10-CM | POA: Diagnosis not present

## 2022-04-01 DIAGNOSIS — D539 Nutritional anemia, unspecified: Secondary | ICD-10-CM | POA: Diagnosis not present

## 2022-04-01 DIAGNOSIS — G319 Degenerative disease of nervous system, unspecified: Secondary | ICD-10-CM | POA: Diagnosis not present

## 2022-04-01 DIAGNOSIS — D696 Thrombocytopenia, unspecified: Secondary | ICD-10-CM | POA: Diagnosis not present

## 2022-04-01 DIAGNOSIS — R29898 Other symptoms and signs involving the musculoskeletal system: Secondary | ICD-10-CM | POA: Diagnosis not present

## 2022-04-01 DIAGNOSIS — Z7689 Persons encountering health services in other specified circumstances: Secondary | ICD-10-CM | POA: Diagnosis not present

## 2022-04-02 ENCOUNTER — Telehealth: Payer: Self-pay

## 2022-04-02 NOTE — Patient Outreach (Signed)
  Emmi Stroke Care Coordination Follow Up  04/02/2022 Name:  Carlos Wise MRN:  [id number] DOB:  [birth year]  Subjective: Carlos Wise is a 81 y.o. year old male who is a primary care patient of Hamrick, Lorin Mercy, MD   An Emmi alert was received indicating patient responded to questions: Smoked or been around smoke?.    I reached out by phone to follow up on the alert and spoke to caregiver/daughter-Jennifer. She reports patient doing well. He competed PCP appt yesterday. MD adjusted cholesterol med as it was making patient n ot feel well. He goes to see neuro MD on 04/09/22. Daughter voices that he was going to outpt therapy.However, MD has decided to order HHPT through [facility] which will be easier for them. She is aware to follow up if she had no heard anything regarding referral. Reviewed and addressed red alert. She confirms that patient remains a smoker and not interested in quitting or cutting back. States he smokes about 10-15 cigarettes/day. Discussed increased risk for stroke with smoking. Daughter aware but states patient chooses to continue to smoke.   Care Coordination Interventions Activated:  Yes  Care Coordination Interventions:  Yes, provided   Follow up plan: No further intervention required. Advised caregiver that they would continue to get automated EMMI-Stroke post discharge calls to assess how they are doing following recent hospitalization and will receive a call from a nurse if any of their responses were abnormal.   Encounter Outcome:  Pt. Visit Completed    Enzo Montgomery, RN,BSN,CCM [place] Management Coordinator Direct Phone: [phone] Toll Free: [phone] Fax: [phone]

## 2022-04-02 NOTE — Patient Outreach (Signed)
Received a red flag Emmi stroke notification for Mr. Ander . I have assigned Enzo Montgomery, RN to call for follow up and determine if there are any Case Management needs.    [facility], [place], [place] Management [phone]

## 2022-04-08 DIAGNOSIS — H6121 Impacted cerumen, right ear: Secondary | ICD-10-CM | POA: Diagnosis not present

## 2022-04-09 ENCOUNTER — Encounter: Payer: Self-pay | Admitting: Neurology

## 2022-04-09 ENCOUNTER — Ambulatory Visit (INDEPENDENT_AMBULATORY_CARE_PROVIDER_SITE_OTHER): Payer: Medicare HMO | Admitting: Neurology

## 2022-04-09 VITALS — BP 145/79 | HR 83 | Ht 67.0 in | Wt 168.0 lb

## 2022-04-09 DIAGNOSIS — S065XAA Traumatic subdural hemorrhage with loss of consciousness status unknown, initial encounter: Secondary | ICD-10-CM

## 2022-04-09 DIAGNOSIS — G40909 Epilepsy, unspecified, not intractable, without status epilepticus: Secondary | ICD-10-CM

## 2022-04-09 DIAGNOSIS — I6329 Cerebral infarction due to unspecified occlusion or stenosis of other precerebral arteries: Secondary | ICD-10-CM | POA: Diagnosis not present

## 2022-04-09 MED ORDER — BRIVARACETAM 100 MG PO TABS: 100.0000 mg | ORAL_TABLET | Freq: Two times a day (BID) | ORAL | 11 refills | Status: DC

## 2022-04-09 MED ORDER — CLOPIDOGREL BISULFATE 75 MG PO TABS: 75.0000 mg | ORAL_TABLET | Freq: Every day | ORAL | 3 refills | Status: DC

## 2022-04-09 NOTE — Progress Notes (Signed)
[facility]  PATIENT: Carlos Wise DOB: [birth year]  REQUESTING CLINICIAN: Barb Merino, MD HISTORY FROM: Patient, caregiver and chart  REASON FOR VISIT: Hospital follow up   HISTORICAL  CHIEF COMPLAINT:  Chief Complaint  Patient presents with   New Patient (Initial Visit)    Rm 13 NP/internal hospital referral for acute metabolic encephalopathy, left lacunar stroke    HISTORY OF PRESENT ILLNESS:  This is a 81 year old gentleman with past medical history of seizure disorder, hypertension, hyperlipidemia, alcoholism who is presenting after recent hospitalization for altered mental status, subdural hemorrhage and new lacunar stroke.  Patient was found down at home, he was initially very confused.  In the ED he was noted to have a subdural bleed, neurosurgery evaluated not a candidate for surgery.  It is unclear what caused the initial confusion as patient was found also to have a new stroke.  His EEG showed diffuse slowing consistent with generalized encephalopathy.  His Keppra was increased from 1000 mg daily to 1000 mg twice daily.  He was continued on aspirin.  Since being home caregiver has noted increased irritability and anger.  He was not irritable when he was on Keppra at 1000 mg daily He still lives alone, somewhat independent still uses alcohol and denies any new seizure or seizure-like activity   Hospital course and summary  81 year old man with hx of alcoholism, tobacco abuse, prior seizures presenting after being found down with seizure activity per daughter.  Found to have small subdural bleed.  Remains confused.  Similar admits w/ postictal states to other hospitals. NSGY evaluated and reviewed scans, no further workup needed. Acute encephalopathy-likely multifactorial including seizure/post ictal, postconcussion, acute stroke, subdural hematoma, alcohol withdrawal, phenobarbital effect (he received phenobarb in the ICU for alcohol withdrawal)  -neurology consulted and followed patient while hospitalized.  EEG LTM-no seizure, moderate diffuse encephalopathy/nonspecific secondary to underlying structural abnormality.  Neurology had recommended Keppra, and it appears that his dose has been increased to 1000 mg twice daily Adult failure to thrive -s/p Repeat SS eval 10/3= D3 diet with full supervision.  Patient initially had a core track, but was adamant to remove it.  He does not want any form of advanced feeding tube.  Now tolerating oral diet.  He is doing well with physical therapy, will be discharged home with home health. Caregiver [name] is arranging for 24/7 home care which will be available starting the day of discharge. Small acute infarcts in the left pons and left corona radiata -Likely due to small vessel disease.  MRI confirmed CVA.  Echocardiogram EF 60%, LDL 161, A1c 5.1.  Continue aspirin, Lipitor 80 mg Hyponatremia-stable.  Alcohol abuse -initially requiring phenobarbital in the ICU.  No longer withdrawing  OTHER MEDICAL CONDITIONS: Seizure disorder, alcoholism, Hypertension, hyperlipidemia,    REVIEW OF SYSTEMS: Full 14 system review of systems performed and negative with exception of: As noted in the HPI   ALLERGIES: Allergies  Allergen Reactions   Penicillins Other (See Comments)    Patient states no allergy, but has preference that he does not want this medication    HOME MEDICATIONS: Outpatient Medications Prior to Visit  Medication Sig Dispense Refill   aspirin EC 81 MG tablet Take 81 mg by mouth daily. Swallow whole.     atorvastatin (LIPITOR) 80 MG tablet Take 1 tablet (80 mg total) by mouth daily. (Patient taking differently: Take 20 mg by mouth daily.) 30 tablet 0   Cholecalciferol (VITAMIN D3 PO) Take 1 capsule  by mouth daily.     Cyanocobalamin (VITAMIN B-12 PO) Take 1 tablet by mouth daily.     escitalopram (LEXAPRO) 10 MG tablet Take 10 mg by mouth daily.     fluticasone (FLONASE) 50 MCG/ACT nasal  spray Place 1 spray into both nostrils daily.     folic acid (FOLVITE) 1 MG tablet Take 1 mg by mouth daily.     gabapentin (NEURONTIN) 300 MG capsule Take 300 mg by mouth at bedtime.     ketotifen (ALLERGY EYE DROPS) 0.025 % ophthalmic solution Place 1 drop into both eyes daily as needed (red eyes).     Naproxen Sod-diphenhydrAMINE (ALEVE PM) 220-25 MG TABS Take 2 tablets by mouth at bedtime.     pantoprazole (PROTONIX) 40 MG tablet Take 40 mg by mouth daily.     Pseudoeph-Doxylamine-DM-APAP (NYQUIL PO) Take 2 capsules by mouth at bedtime as needed (sleep when aleve pm is no working).     sodium chloride (OCEAN) 0.65 % SOLN nasal spray Place 1 spray into both nostrils daily as needed for congestion.     sodium chloride 1 g tablet Take 1 g by mouth 3 (three) times daily.     thiamine (VITAMIN B1) 100 MG tablet Take 100 mg by mouth daily.     levETIRAcetam (KEPPRA) 500 MG tablet Take 2 tablets (1,000 mg total) by mouth 2 (two) times daily. 120 tablet 0   nicotine (NICODERM CQ - DOSED IN MG/24 HOURS) 14 mg/24hr patch Place 1 patch (14 mg total) onto the skin daily. (Patient not taking: Reported on 02/28/2022) 28 patch 0   No facility-administered medications prior to visit.    PAST MEDICAL HISTORY: Past Medical History:  Diagnosis Date   Herpes zoster    Hyperlipemia    Hypertension    Intestinal polyp    hyperplastic   Prostate cancer ([place])    S/P radiation therapy 12/12/2014 through 02/06/2015                                                      Prostate 7800 cGy in 40 sessions, seminal vesicles 5600 cGy in 40 sessions                           Seizure disorder ([place])     PAST SURGICAL HISTORY: Past Surgical History:  Procedure Laterality Date   Carotid Artery Catherization     PROSTATE BIOPSY  06/27/2014    FAMILY HISTORY: Family History  Problem Relation Age of Onset   Colon cancer Mother    Prostate cancer Father    Heart attack Father     SOCIAL HISTORY: Social History    Socioeconomic History   Marital status: Widowed    Spouse name: Not on file   Number of children: 0   Years of education: Not on file   Highest education level: Not on file  Occupational History   Occupation: Retired  Tobacco Use   Smoking status: Former   Smokeless tobacco: Never   Tobacco comments:    Quit 30 years ago  Substance and Sexual Activity   Alcohol use: Yes    Alcohol/week: 0.0 standard drinks of alcohol    Comment: 3 oz daily   Drug use: No   Sexual activity: Not on file  Other  Topics Concern   Not on file  Social History Narrative   Not on file   Social Determinants of Health   Financial Resource Strain: Not on file  Food Insecurity: No Food Insecurity (03/01/2022)   Hunger Vital Sign    Worried About Running Out of Food in the Last Year: Never true    Ran Out of Food in the Last Year: Never true  Transportation Needs: No Transportation Needs (03/01/2022)   PRAPARE - Hydrologist (Medical): No    Lack of Transportation (Non-Medical): No  Physical Activity: Not on file  Stress: Not on file  Social Connections: Not on file  Intimate Partner Violence: Not At Risk (03/01/2022)   Humiliation, Afraid, Rape, and Kick questionnaire    Fear of Current or Ex-Partner: No    Emotionally Abused: No    Physically Abused: No    Sexually Abused: No    PHYSICAL EXAM  GENERAL EXAM/CONSTITUTIONAL: Vitals:  Vitals:   04/09/22 1532  BP: (!) 145/79  Pulse: 83  Weight: 168 lb (76.2 kg)  Height: '5\' 7"'$  (1.702 m)   Body mass index is 26.31 kg/m. Wt Readings from Last 3 Encounters:  04/09/22 168 lb (76.2 kg)  03/25/22 171 lb 11.8 oz (77.9 kg)  02/28/22 170 lb 10.2 oz (77.4 kg)   Patient is in no distress; well developed, nourished and groomed; neck is supple  EYES: Pupils round and reactive to light, Visual fields full to confrontation, Extraocular movements intacts,   MUSCULOSKELETAL: Gait, strength, tone, movements noted in  Neurologic exam below  NEUROLOGIC: MENTAL STATUS:      No data to display         awake, alert, oriented to person, place and time with psychomotor retardation  recent and remote memory intact normal attention and concentration language fluent, comprehension intact, naming intact fund of knowledge appropriate  CRANIAL NERVE:  2nd, 3rd, 4th, 6th - pupils equal and reactive to light, visual fields full to confrontation, extraocular muscles intact, no nystagmus 5th - facial sensation symmetric 7th - facial strength symmetric 8th - hearing intact 9th - palate elevates symmetrically, uvula midline 11th - shoulder shrug symmetric 12th - tongue protrusion midline  MOTOR:  normal bulk and tone, at least antigravity in the BUE and BLE  SENSORY:  normal and symmetric to light touch  COORDINATION:  finger-nose-finger, fine finger movements normal  GAIT/STATION:  Ambulates with a walker     DIAGNOSTIC DATA (LABS, IMAGING, TESTING) - I reviewed patient records, labs, notes, testing and imaging myself where available.  Lab Results  Component Value Date   WBC 9.3 03/24/2022   HGB 12.1 (L) 03/24/2022   HCT 34.7 (L) 03/24/2022   MCV 93.0 03/24/2022   PLT 314 03/24/2022      Component Value Date/Time   NA 132 (L) 03/24/2022 0025   K 4.3 03/24/2022 0025   CL 95 (L) 03/24/2022 0025   CO2 26 03/24/2022 0025   GLUCOSE 95 03/24/2022 0025   BUN 20 03/24/2022 0025   CREATININE 0.82 03/24/2022 0025   CALCIUM 9.3 03/24/2022 0025   PROT 6.8 03/09/2022 1310   ALBUMIN 4.1 03/09/2022 1310   AST 32 03/09/2022 1310   ALT 13 03/09/2022 1310   ALKPHOS 63 03/09/2022 1310   BILITOT 0.8 03/09/2022 1310   GFRNONAA >60 03/24/2022 0025   GFRAA >60 02/08/2020 0408   Lab Results  Component Value Date   CHOL 229 (H) 03/02/2022   HDL 48 03/02/2022  LDLCALC 161 (H) 03/02/2022   TRIG 98 03/02/2022   CHOLHDL 4.8 03/02/2022   Lab Results  Component Value Date   HGBA1C 5.1 03/02/2022    Lab Results  Component Value Date   [id number] 371 (H) 11/23/2021   Lab Results  Component Value Date   TSH 2.352 11/23/2021    MRI Brain 03/01/22 1. 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 No associated hemorrhage or mass effect. 2. Otherwise stable noncontrast MRI appearance of the brain since June with moderately advanced signal changes in the white matter and pons suspected due to small vessel disease.  CT Head, CTA Head and Neck 03/01/2022 1. CT head negative for acute hemorrhage. 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 Small acute infarct left centrum semiovale best seen on MRI. 2. Negative for intracranial large vessel occlusion. 3. 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 No residual aneurysm. 4. 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 Right internal carotid artery diffusely disease without significant stenosis 5. Atherosclerotic disease left carotid bifurcation without significant stenosis 6. No significant vertebral artery stenosis.  EEG 03/10/22 - Continuous slow, generalized and lateralized left hemisphere - Continuous slow, right temporal region  ASSESSMENT AND PLAN  81 y.o. year old male with seizure disorder, hypertension, hyperlipidemia, alcoholism who is presenting after recent hospitalization for altered mental status, subdural hemorrhage and new lacunar stroke.  For his seizure disorder, he is on Keppra 1000 mg twice daily, denies any additional seizure but there is side effect of irritability and anger.  Due to the side effects, we will try to switch him from Keppra to Briviact 100 mg twice daily.   For his stroke, he did have a lacunar stroke, currently he is on aspirin but I will add Plavix and have the patient continue DAPT for 90 days.  After completion of the 90 days of DAPT, patient is to continue with Plavix alone.  Advised him  to continue with other medications, continue physical therapy and follow-up in 72-month sooner if worse   1. Seizure disorder ([place]   2. 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 SDH (subdural hematoma) ([facility])      Patient Instructions  Discontinue Keppra  Start Briviact 100 mg twice daily  Start with Plavix and Aspirin for a total of 90 days and continue with Plavix thereafter  Continue your other medications  Follow up in 6 months or sooner if worse   No orders of the defined types were placed in this encounter.   Meds ordered this encounter  Medications   brivaracetam (BRIVIACT) 100 MG TABS tablet    Sig: Take 1 tablet (100 mg total) by mouth 2 (two) times daily.    Dispense:  60 tablet    Refill:  11   clopidogrel (PLAVIX) 75 MG tablet    Sig: Take 1 tablet (75 mg total) by mouth daily.    Dispense:  90 tablet    Refill:  3    Return in about 6 months (around 10/09/2022).    AAlric Ran MD 04/10/2022, 4:05 PM  [facility] [address] [place] [phone]

## 2022-04-09 NOTE — Patient Instructions (Signed)
Discontinue Keppra  Start Briviact 100 mg twice daily  Start with Plavix and Aspirin for a total of 90 days and continue with Plavix thereafter  Continue your other medications  Follow up in 6 months or sooner if worse

## 2022-04-10 DIAGNOSIS — Z9181 History of falling: Secondary | ICD-10-CM | POA: Diagnosis not present

## 2022-04-10 DIAGNOSIS — I69398 Other sequelae of cerebral infarction: Secondary | ICD-10-CM | POA: Diagnosis not present

## 2022-04-10 DIAGNOSIS — R69 Illness, unspecified: Secondary | ICD-10-CM | POA: Diagnosis not present

## 2022-04-10 DIAGNOSIS — E440 Moderate protein-calorie malnutrition: Secondary | ICD-10-CM | POA: Diagnosis not present

## 2022-04-10 DIAGNOSIS — Z7982 Long term (current) use of aspirin: Secondary | ICD-10-CM | POA: Diagnosis not present

## 2022-04-10 DIAGNOSIS — G319 Degenerative disease of nervous system, unspecified: Secondary | ICD-10-CM | POA: Diagnosis not present

## 2022-04-10 DIAGNOSIS — F17200 Nicotine dependence, unspecified, uncomplicated: Secondary | ICD-10-CM | POA: Diagnosis not present

## 2022-04-10 DIAGNOSIS — G40909 Epilepsy, unspecified, not intractable, without status epilepticus: Secondary | ICD-10-CM | POA: Diagnosis not present

## 2022-04-10 DIAGNOSIS — M6281 Muscle weakness (generalized): Secondary | ICD-10-CM | POA: Diagnosis not present

## 2022-04-10 DIAGNOSIS — Z8546 Personal history of malignant neoplasm of prostate: Secondary | ICD-10-CM | POA: Diagnosis not present

## 2022-04-10 DIAGNOSIS — K219 Gastro-esophageal reflux disease without esophagitis: Secondary | ICD-10-CM | POA: Diagnosis not present

## 2022-04-10 DIAGNOSIS — D649 Anemia, unspecified: Secondary | ICD-10-CM | POA: Diagnosis not present

## 2022-04-10 DIAGNOSIS — G629 Polyneuropathy, unspecified: Secondary | ICD-10-CM | POA: Diagnosis not present

## 2022-04-10 DIAGNOSIS — I10 Essential (primary) hypertension: Secondary | ICD-10-CM | POA: Diagnosis not present

## 2022-04-10 DIAGNOSIS — E7800 Pure hypercholesterolemia, unspecified: Secondary | ICD-10-CM | POA: Diagnosis not present

## 2022-04-15 IMAGING — MR MR HEAD W/O CM
14 series · 48 of 48 positions shown · non-contrast
Comparison: Head CT 02/04/2020

CLINICAL DATA: Suspected stroke.  Aphasia.

EXAM:
MRI HEAD WITHOUT CONTRAST
TECHNIQUE: Multiplanar, multiecho pulse sequences of the brain and surrounding
structures were obtained without intravenous contrast.

[Series 5: DWI · axial · 3.0mm · 0.88mm/px · z∈[-66,+82]mm · 7 of 104 slices shown (1 of 8)]
[im 1/104]
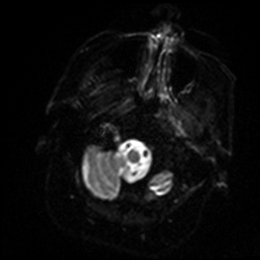
[im 18/104]
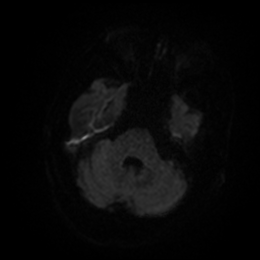
[im 35/104]
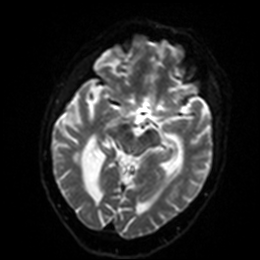
[im 52/104]
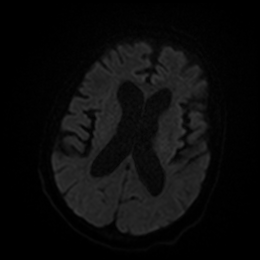
[im 69/104]
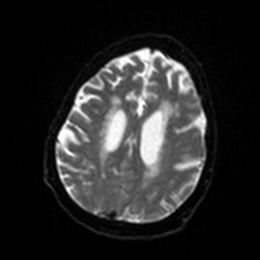
[im 86/104]
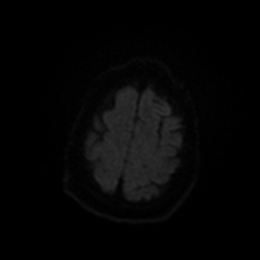
[im 104/104]
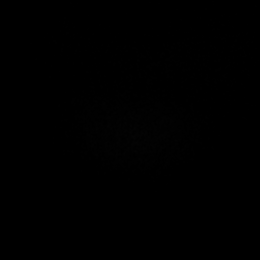

[Series 6: DWI · axial · 3.0mm · 0.88mm/px · z∈[-66,+82]mm · 4 of 52 slices shown (2 of 8)]
[im 1/52]
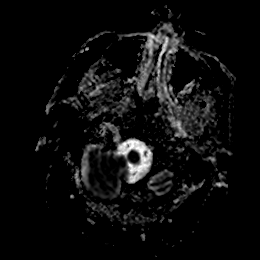
[im 18/52]
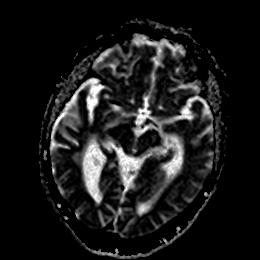
[im 35/52]
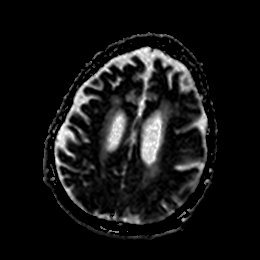
[im 52/52]
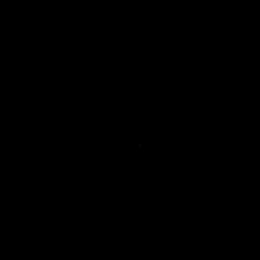

[Series 7: DWI · coronal · 4.0mm · 0.88mm/px · 5 of 72 slices shown (3 of 8)]
[im 1/72]
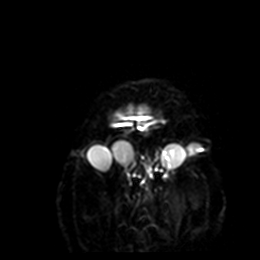
[im 18/72]
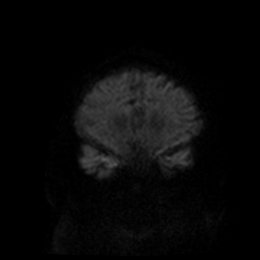
[im 36/72]
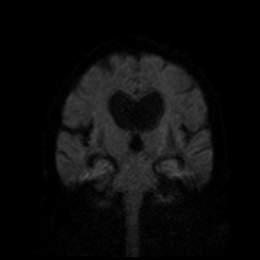
[im 54/72]
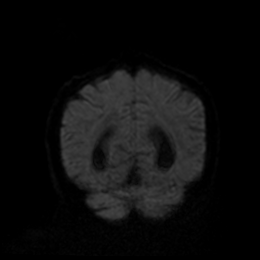
[im 72/72]
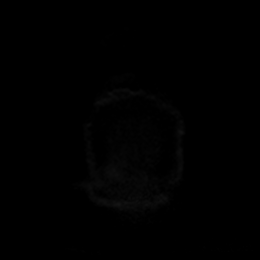

[Series 8: DWI · coronal · 4.0mm · 0.88mm/px · 2 of 36 slices shown (4 of 8)]
[im 1/36]
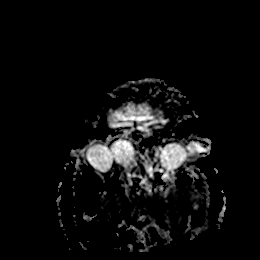
[im 36/36]
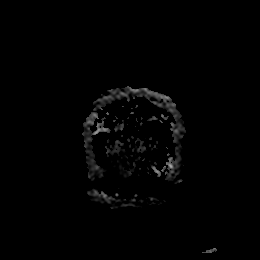

[Series 10: DWI · coronal · 4.0mm · 0.88mm/px · 5 of 72 slices shown (5 of 8)]
[im 1/72]
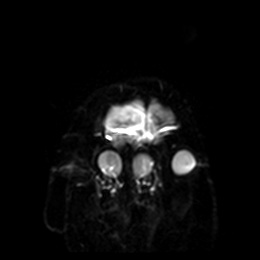
[im 18/72]
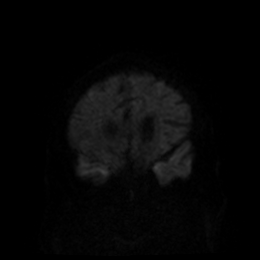
[im 36/72]
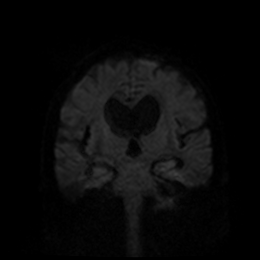
[im 54/72]
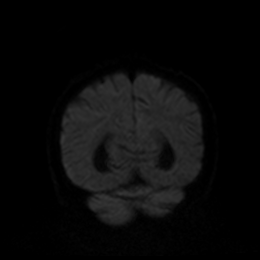
[im 72/72]
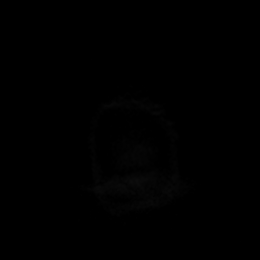

[Series 11: DWI · coronal · 4.0mm · 0.88mm/px · 2 of 36 slices shown (6 of 8)]
[im 1/36]
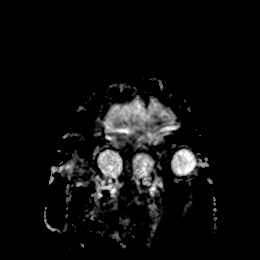
[im 36/36]
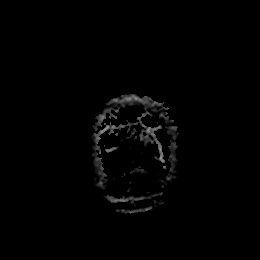

[Series 12: FLAIR · axial · 5.0mm · 0.90mm/px · z∈[-89,+79]mm · 2 of 30 slices shown]
[im 1/30]
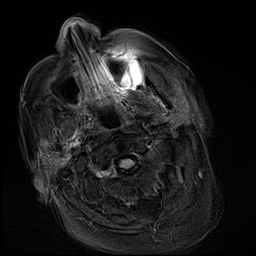
[im 30/30]
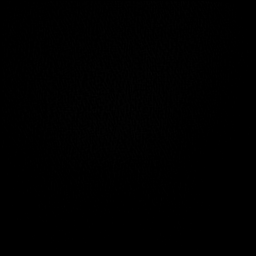

[Series 13: ax hemo · axial · 5.0mm · 0.86mm/px · z∈[-93,+75]mm · 2 of 30 slices shown]
[im 1/30]
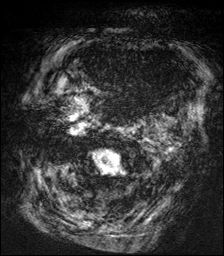
[im 30/30]
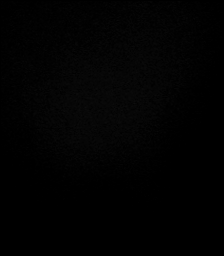

[Series 14: T2 · axial · 5.0mm · 0.72mm/px · z∈[-93,+76]mm · 2 of 30 slices shown (1 of 2)]
[im 1/30]
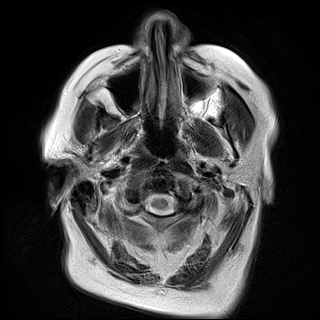
[im 30/30]
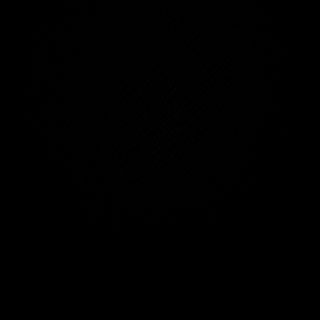

[Series 16: DWI · axial · 3.0mm · 0.88mm/px · z∈[-96,+58]mm · 7 of 108 slices shown (7 of 8)]
[im 1/108]
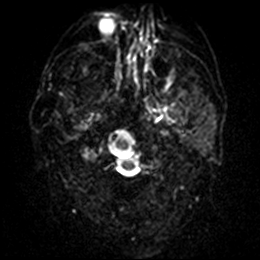
[im 18/108]
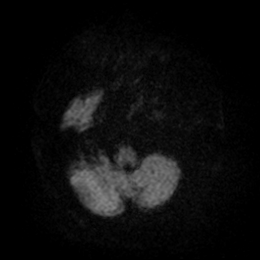
[im 36/108]
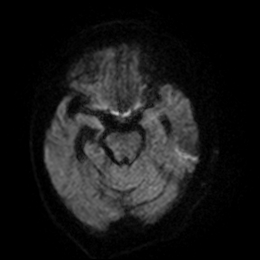
[im 54/108]
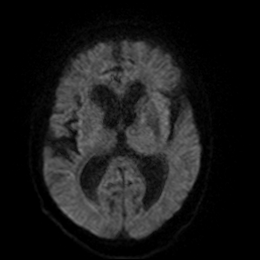
[im 72/108]
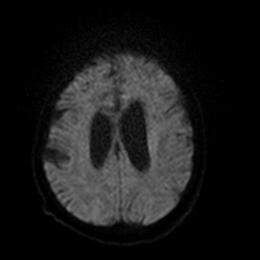
[im 90/108]
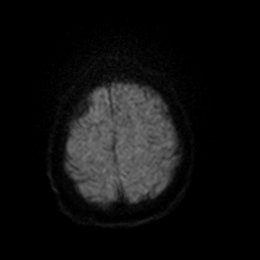
[im 108/108]
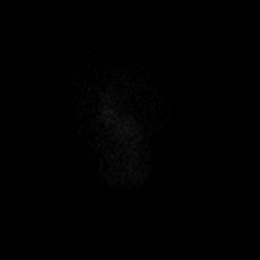

[Series 17: DWI · axial · 3.0mm · 0.88mm/px · z∈[-96,+58]mm · 4 of 54 slices shown (8 of 8)]
[im 1/54]
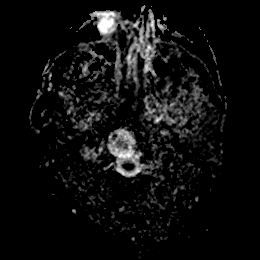
[im 18/54]
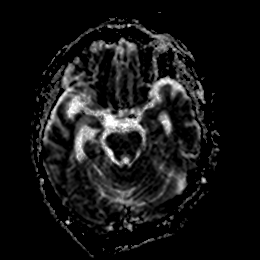
[im 36/54]
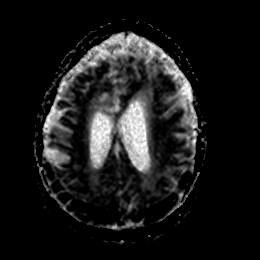
[im 54/54]
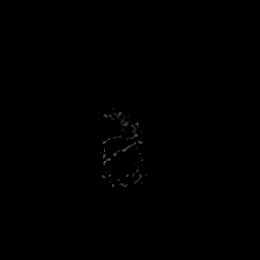

[Series 18: ax hemo 2nd · axial · 5.0mm · 0.86mm/px · z∈[-98,+71]mm · 2 of 30 slices shown]
[im 1/30]
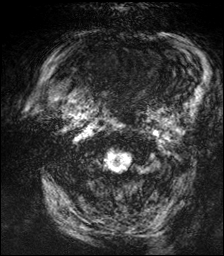
[im 30/30]
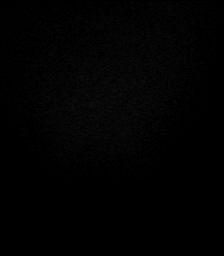

[Series 19: T1 · sagittal · 5.0mm · 1.03mm/px · 2 of 25 slices shown]
[im 1/25]
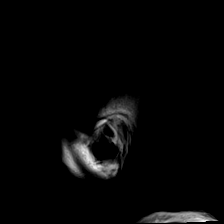
[im 25/25]
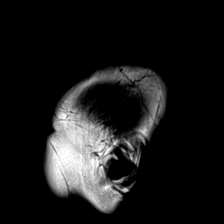

[Series 20: T2 · coronal · 5.0mm · 1.03mm/px · 2 of 32 slices shown (2 of 2)]
[im 1/32]
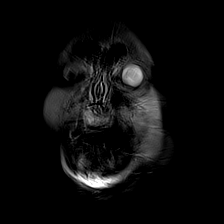
[im 32/32]
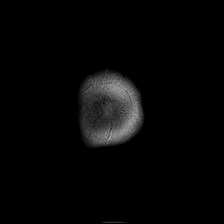

[48 of 48 positions shown; findings below may reference images not displayed]

FINDINGS: Examination degraded by motion.

BRAIN: No acute infarct, acute hemorrhage or extra-axial collection.
Multifocal white matter hyperintensity, most commonly due to chronic
ischemic microangiopathy. Advanced parenchymal volume loss. No
chronic microhemorrhage. Normal midline structures.

VASCULAR: Major flow voids are preserved.

SKULL AND UPPER CERVICAL SPINE: Normal calvarium and skull base.
Visualized upper cervical spine and soft tissues are normal.

SINUSES/ORBITS: No paranasal sinus fluid levels or advanced mucosal
thickening. No mastoid or middle ear effusion. Normal orbits.
IMPRESSION: 1. Motion degraded examination.
2. No acute intracranial process.
3. Advanced parenchymal volume loss and findings of chronic small
vessel disease.

## 2022-04-20 ENCOUNTER — Telehealth: Payer: Self-pay | Admitting: Neurology

## 2022-04-20 NOTE — Telephone Encounter (Signed)
Carlos Wise is calling. Stated she needs s prior authorization for medication  brivaracetam (BRIVIACT) 100 MG TABS tablet .She is requesting a call back from the nurse

## 2022-04-20 NOTE — Telephone Encounter (Signed)
I will submit PA.

## 2022-04-20 NOTE — Telephone Encounter (Signed)
PA submitted via cmm Key: [id number] This approval authorizes your coverage from 06/15/2021 - 06/14/2022

## 2022-04-22 ENCOUNTER — Telehealth: Payer: Self-pay | Admitting: Neurology

## 2022-04-22 NOTE — Telephone Encounter (Signed)
I returned Carlos Wise's call. She reports the pharmacy called and Briviact is approved through insurance but has high copay 400 $(per 30 day).  He is unable to afford the cost of this medication. He continues to take Keppra 500 mg bid, but mood swings/behavioral disturbance continues.   [name] wanted to know if MD could recommend  med alternative?

## 2022-04-22 NOTE — Telephone Encounter (Signed)
[name] on DPR called needing to discuss pt's medications and changes. Please advise.

## 2022-04-24 ENCOUNTER — Other Ambulatory Visit: Payer: Self-pay | Admitting: Neurology

## 2022-04-24 MED ORDER — OXCARBAZEPINE 300 MG PO TABS: 300.0000 mg | ORAL_TABLET | Freq: Two times a day (BID) | ORAL | 11 refills | Status: DC

## 2022-04-24 NOTE — Progress Notes (Signed)
Briviact approved but with high copay, will try Trileptal.

## 2022-04-24 NOTE — Telephone Encounter (Signed)
Since Briviact not approved, and patient continue to drink alcohol, we can switch him to Trileptal. Advised them to take the trileptal with Keppra together for one week, then decrease Keppra to once a day for another week and stop the Keppra and continue with Trileptal 300 mg twice daily.

## 2022-04-27 NOTE — Telephone Encounter (Signed)
Thank you :)

## 2022-04-27 NOTE — Telephone Encounter (Signed)
I called patient's caregiver, [name], per DPR.  She reports that patient started the Trileptal this weekend.  He is currently taking Keppra 1000 mg twice daily.  The 2000 mg twice daily of Keppra was making him too moody.  He will continue taking Keppra 1000 mg twice daily with the Trileptal this week and then decrease to Keppra 1000 mg once daily next week and then stop.  They will let us know if they have further questions or concerns.

## 2022-04-28 DIAGNOSIS — R339 Retention of urine, unspecified: Secondary | ICD-10-CM | POA: Diagnosis not present

## 2022-04-29 ENCOUNTER — Encounter (HOSPITAL_COMMUNITY): Payer: Self-pay | Admitting: Internal Medicine

## 2022-04-29 ENCOUNTER — Emergency Department (HOSPITAL_COMMUNITY): Payer: Medicare HMO

## 2022-04-29 ENCOUNTER — Inpatient Hospital Stay (HOSPITAL_COMMUNITY)
Admission: EM | Admit: 2022-04-29 | Discharge: 2022-05-03 | DRG: 643 | Disposition: A | Discharge status: Home Health | Payer: Medicare HMO | Attending: Family Medicine | Admitting: Family Medicine

## 2022-04-29 ENCOUNTER — Other Ambulatory Visit: Payer: Self-pay

## 2022-04-29 ENCOUNTER — Telehealth: Payer: Self-pay | Admitting: Neurology

## 2022-04-29 DIAGNOSIS — I110 Hypertensive heart disease with heart failure: Secondary | ICD-10-CM | POA: Diagnosis not present

## 2022-04-29 DIAGNOSIS — G40909 Epilepsy, unspecified, not intractable, without status epilepticus: Secondary | ICD-10-CM | POA: Diagnosis not present

## 2022-04-29 DIAGNOSIS — C61 Malignant neoplasm of prostate: Secondary | ICD-10-CM | POA: Diagnosis present

## 2022-04-29 DIAGNOSIS — T43225A Adverse effect of selective serotonin reuptake inhibitors, initial encounter: Secondary | ICD-10-CM | POA: Diagnosis not present

## 2022-04-29 DIAGNOSIS — Z7982 Long term (current) use of aspirin: Secondary | ICD-10-CM | POA: Diagnosis not present

## 2022-04-29 DIAGNOSIS — I6329 Cerebral infarction due to unspecified occlusion or stenosis of other precerebral arteries: Secondary | ICD-10-CM | POA: Diagnosis present

## 2022-04-29 DIAGNOSIS — R569 Unspecified convulsions: Secondary | ICD-10-CM | POA: Diagnosis not present

## 2022-04-29 DIAGNOSIS — E785 Hyperlipidemia, unspecified: Secondary | ICD-10-CM | POA: Diagnosis present

## 2022-04-29 DIAGNOSIS — Z789 Other specified health status: Secondary | ICD-10-CM | POA: Diagnosis not present

## 2022-04-29 DIAGNOSIS — F32A Depression, unspecified: Secondary | ICD-10-CM | POA: Diagnosis not present

## 2022-04-29 DIAGNOSIS — Z8673 Personal history of transient ischemic attack (TIA), and cerebral infarction without residual deficits: Secondary | ICD-10-CM | POA: Diagnosis not present

## 2022-04-29 DIAGNOSIS — K219 Gastro-esophageal reflux disease without esophagitis: Secondary | ICD-10-CM | POA: Diagnosis not present

## 2022-04-29 DIAGNOSIS — F1020 Alcohol dependence, uncomplicated: Secondary | ICD-10-CM | POA: Diagnosis not present

## 2022-04-29 DIAGNOSIS — Z923 Personal history of irradiation: Secondary | ICD-10-CM | POA: Diagnosis not present

## 2022-04-29 DIAGNOSIS — Z8249 Family history of ischemic heart disease and other diseases of the circulatory system: Secondary | ICD-10-CM | POA: Diagnosis not present

## 2022-04-29 DIAGNOSIS — W19XXXA Unspecified fall, initial encounter: Secondary | ICD-10-CM

## 2022-04-29 DIAGNOSIS — Z7902 Long term (current) use of antithrombotics/antiplatelets: Secondary | ICD-10-CM

## 2022-04-29 DIAGNOSIS — I10 Essential (primary) hypertension: Secondary | ICD-10-CM | POA: Diagnosis present

## 2022-04-29 DIAGNOSIS — Z743 Need for continuous supervision: Secondary | ICD-10-CM | POA: Diagnosis not present

## 2022-04-29 DIAGNOSIS — G9341 Metabolic encephalopathy: Secondary | ICD-10-CM | POA: Diagnosis not present

## 2022-04-29 DIAGNOSIS — E222 Syndrome of inappropriate secretion of antidiuretic hormone: Secondary | ICD-10-CM | POA: Diagnosis not present

## 2022-04-29 DIAGNOSIS — E440 Moderate protein-calorie malnutrition: Secondary | ICD-10-CM | POA: Diagnosis present

## 2022-04-29 DIAGNOSIS — Z800 Family history of malignant neoplasm of digestive organs: Secondary | ICD-10-CM | POA: Diagnosis not present

## 2022-04-29 DIAGNOSIS — Z6826 Body mass index (BMI) 26.0-26.9, adult: Secondary | ICD-10-CM

## 2022-04-29 DIAGNOSIS — Z8042 Family history of malignant neoplasm of prostate: Secondary | ICD-10-CM | POA: Diagnosis not present

## 2022-04-29 DIAGNOSIS — I509 Heart failure, unspecified: Secondary | ICD-10-CM

## 2022-04-29 DIAGNOSIS — G319 Degenerative disease of nervous system, unspecified: Secondary | ICD-10-CM | POA: Diagnosis not present

## 2022-04-29 DIAGNOSIS — Z8546 Personal history of malignant neoplasm of prostate: Secondary | ICD-10-CM | POA: Diagnosis not present

## 2022-04-29 DIAGNOSIS — F17210 Nicotine dependence, cigarettes, uncomplicated: Secondary | ICD-10-CM | POA: Diagnosis present

## 2022-04-29 DIAGNOSIS — M25561 Pain in right knee: Secondary | ICD-10-CM | POA: Diagnosis not present

## 2022-04-29 DIAGNOSIS — G629 Polyneuropathy, unspecified: Secondary | ICD-10-CM | POA: Diagnosis not present

## 2022-04-29 DIAGNOSIS — R531 Weakness: Secondary | ICD-10-CM | POA: Diagnosis not present

## 2022-04-29 DIAGNOSIS — I5030 Unspecified diastolic (congestive) heart failure: Secondary | ICD-10-CM | POA: Diagnosis present

## 2022-04-29 DIAGNOSIS — T421X5A Adverse effect of iminostilbenes, initial encounter: Secondary | ICD-10-CM | POA: Diagnosis present

## 2022-04-29 DIAGNOSIS — E871 Hypo-osmolality and hyponatremia: Principal | ICD-10-CM

## 2022-04-29 DIAGNOSIS — R29818 Other symptoms and signs involving the nervous system: Secondary | ICD-10-CM | POA: Diagnosis not present

## 2022-04-29 DIAGNOSIS — Z79899 Other long term (current) drug therapy: Secondary | ICD-10-CM | POA: Diagnosis not present

## 2022-04-29 DIAGNOSIS — G9340 Encephalopathy, unspecified: Secondary | ICD-10-CM | POA: Diagnosis not present

## 2022-04-29 DIAGNOSIS — R4182 Altered mental status, unspecified: Secondary | ICD-10-CM | POA: Diagnosis not present

## 2022-04-29 DIAGNOSIS — R69 Illness, unspecified: Secondary | ICD-10-CM | POA: Diagnosis not present

## 2022-04-29 LAB — I-STAT CHEM 8, ED
BUN: 6 mg/dL — ABNORMAL LOW (ref 8–23)
Calcium, Ion: 1.08 mmol/L — ABNORMAL LOW (ref 1.15–1.40)
Chloride: 81 mmol/L — ABNORMAL LOW (ref 98–111)
Creatinine, Ser: 0.6 mg/dL — ABNORMAL LOW (ref 0.61–1.24)
Glucose, Bld: 100 mg/dL — ABNORMAL HIGH (ref 70–99)
HCT: 40 % (ref 39.0–52.0)
Hemoglobin: 13.6 g/dL (ref 13.0–17.0)
Potassium: 3.9 mmol/L (ref 3.5–5.1)
Sodium: 114 mmol/L — CL (ref 135–145)
TCO2: 24 mmol/L (ref 22–32)

## 2022-04-29 LAB — BASIC METABOLIC PANEL
Anion gap: 14 (ref 5–15)
Anion gap: 11 (ref 5–15)
Anion gap: 12 (ref 5–15)
BUN: 6 mg/dL — ABNORMAL LOW (ref 8–23)
BUN: 6 mg/dL — ABNORMAL LOW (ref 8–23)
BUN: 6 mg/dL — ABNORMAL LOW (ref 8–23)
CO2: 20 mmol/L — ABNORMAL LOW (ref 22–32)
CO2: 21 mmol/L — ABNORMAL LOW (ref 22–32)
CO2: 20 mmol/L — ABNORMAL LOW (ref 22–32)
Calcium: 9 mg/dL (ref 8.9–10.3)
Calcium: 9.4 mg/dL (ref 8.9–10.3)
Calcium: 9.1 mg/dL (ref 8.9–10.3)
Chloride: 85 mmol/L — ABNORMAL LOW (ref 98–111)
Chloride: 86 mmol/L — ABNORMAL LOW (ref 98–111)
Chloride: 87 mmol/L — ABNORMAL LOW (ref 98–111)
Creatinine, Ser: 0.63 mg/dL (ref 0.61–1.24)
Creatinine, Ser: 0.68 mg/dL (ref 0.61–1.24)
Creatinine, Ser: 0.68 mg/dL (ref 0.61–1.24)
GFR, Estimated: >60 mL/min (ref >60)
GFR, Estimated: >60 mL/min (ref >60)
GFR, Estimated: >60 mL/min (ref >60)
Glucose, Bld: 117 mg/dL — ABNORMAL HIGH (ref 70–99)
Glucose, Bld: 122 mg/dL — ABNORMAL HIGH (ref 70–99)
Glucose, Bld: 101 mg/dL — ABNORMAL HIGH (ref 70–99)
Potassium: 4 mmol/L (ref 3.5–5.1)
Potassium: 4 mmol/L (ref 3.5–5.1)
Potassium: 3.9 mmol/L (ref 3.5–5.1)
Sodium: 119 mmol/L — CL (ref 135–145)
Sodium: 118 mmol/L — CL (ref 135–145)
Sodium: 119 mmol/L — CL (ref 135–145)

## 2022-04-29 LAB — CBC
HCT: 34.7 % — ABNORMAL LOW (ref 39.0–52.0)
HCT: 33.8 % — ABNORMAL LOW (ref 39.0–52.0)
Hemoglobin: 13 g/dL (ref 13.0–17.0)
Hemoglobin: 12.5 g/dL — ABNORMAL LOW (ref 13.0–17.0)
MCH: 32.1 pg (ref 26.0–34.0)
MCH: 32.2 pg (ref 26.0–34.0)
MCHC: 37.5 g/dL — ABNORMAL HIGH (ref 30.0–36.0)
MCHC: 37 g/dL — ABNORMAL HIGH (ref 30.0–36.0)
MCV: 85.7 fL (ref 80.0–100.0)
MCV: 87.1 fL (ref 80.0–100.0)
Platelets: 252 10*3/uL (ref 150–400)
Platelets: 246 10*3/uL (ref 150–400)
RBC: 4.05 MIL/uL — ABNORMAL LOW (ref 4.22–5.81)
RBC: 3.88 MIL/uL — ABNORMAL LOW (ref 4.22–5.81)
RDW: 11.6 % (ref 11.5–15.5)
RDW: 11.8 % (ref 11.5–15.5)
WBC: 10 10*3/uL (ref 4.0–10.5)
WBC: 9.7 10*3/uL (ref 4.0–10.5)
nRBC: 0 % (ref 0.0–0.2)
nRBC: 0 % (ref 0.0–0.2)

## 2022-04-29 LAB — DIFFERENTIAL
Abs Immature Granulocytes: 0.06 10*3/uL (ref 0.00–0.07)
Basophils Absolute: 0.1 10*3/uL (ref 0.0–0.1)
Basophils Relative: 1 %
Eosinophils Absolute: 0.2 10*3/uL (ref 0.0–0.5)
Eosinophils Relative: 2 %
Immature Granulocytes: 1 %
Lymphocytes Relative: 8 %
Lymphs Abs: 0.8 10*3/uL (ref 0.7–4.0)
Monocytes Absolute: 0.8 10*3/uL (ref 0.1–1.0)
Monocytes Relative: 8 %
Neutro Abs: 8 10*3/uL — ABNORMAL HIGH (ref 1.7–7.7)
Neutrophils Relative %: 80 %

## 2022-04-29 LAB — COMPREHENSIVE METABOLIC PANEL
ALT: 16 U/L (ref 0–44)
AST: 41 U/L (ref 15–41)
Albumin: 4.1 g/dL (ref 3.5–5.0)
Alkaline Phosphatase: 63 U/L (ref 38–126)
Anion gap: 15 (ref 5–15)
BUN: 6 mg/dL — ABNORMAL LOW (ref 8–23)
CO2: 21 mmol/L — ABNORMAL LOW (ref 22–32)
Calcium: 9.3 mg/dL (ref 8.9–10.3)
Chloride: 81 mmol/L — ABNORMAL LOW (ref 98–111)
Creatinine, Ser: 0.71 mg/dL (ref 0.61–1.24)
GFR, Estimated: >60 mL/min (ref >60)
Glucose, Bld: 103 mg/dL — ABNORMAL HIGH (ref 70–99)
Potassium: 3.8 mmol/L (ref 3.5–5.1)
Sodium: 117 mmol/L — CL (ref 135–145)
Total Bilirubin: 1.2 mg/dL (ref 0.3–1.2)
Total Protein: 6.8 g/dL (ref 6.5–8.1)

## 2022-04-29 LAB — GLUCOSE, CAPILLARY: Glucose-Capillary: 136 mg/dL — ABNORMAL HIGH (ref 70–99)

## 2022-04-29 LAB — CARBAMAZEPINE LEVEL, TOTAL: Carbamazepine Lvl: <2 ug/mL — ABNORMAL LOW (ref 4.0–12.0)

## 2022-04-29 LAB — APTT: aPTT: 29 seconds (ref 24–36)

## 2022-04-29 LAB — AMMONIA: Ammonia: 31 umol/L (ref 9–35)

## 2022-04-29 LAB — URINALYSIS, ROUTINE W REFLEX MICROSCOPIC
Bacteria, UA: NONE SEEN
Bilirubin Urine: NEGATIVE
Glucose, UA: NEGATIVE mg/dL
Ketones, ur: 5 mg/dL — AB
Leukocytes,Ua: NEGATIVE
Nitrite: NEGATIVE
Protein, ur: NEGATIVE mg/dL
Specific Gravity, Urine: 1.008 (ref 1.005–1.030)
pH: 7 (ref 5.0–8.0)

## 2022-04-29 LAB — MRSA NEXT GEN BY PCR, NASAL: MRSA by PCR Next Gen: NOT DETECTED

## 2022-04-29 LAB — PROTIME-INR
INR: 1.1 (ref 0.8–1.2)
Prothrombin Time: 13.6 seconds (ref 11.4–15.2)

## 2022-04-29 LAB — RAPID URINE DRUG SCREEN, HOSP PERFORMED
Amphetamines: NOT DETECTED
Barbiturates: NOT DETECTED
Benzodiazepines: NOT DETECTED
Cocaine: NOT DETECTED
Opiates: NOT DETECTED
Tetrahydrocannabinol: NOT DETECTED

## 2022-04-29 LAB — SODIUM, URINE, RANDOM: Sodium, Ur: 110 mmol/L

## 2022-04-29 LAB — ETHANOL: Alcohol, Ethyl (B): <10 mg/dL (ref <10)

## 2022-04-29 LAB — SODIUM: Sodium: 121 mmol/L — ABNORMAL LOW (ref 135–145)

## 2022-04-29 LAB — VITAMIN B12: Vitamin B-12: 827 pg/mL (ref 180–914)

## 2022-04-29 LAB — TSH: TSH: 1.477 u[IU]/mL (ref 0.350–4.500)

## 2022-04-29 LAB — URIC ACID: Uric Acid, Serum: 2.1 mg/dL — ABNORMAL LOW (ref 3.7–8.6)

## 2022-04-29 MED ORDER — POLYETHYLENE GLYCOL 3350 17 G PO PACK: 17.0000 g | PACK | Freq: Every day | ORAL | Status: DC | PRN

## 2022-04-29 MED ORDER — ACETAMINOPHEN 650 MG RE SUPP
325.0000 mg | RECTAL | Status: DC | PRN
  Administered 2022-04-29 – 2022-04-30 (×2): 325 mg via RECTAL
  Filled 2022-04-29 (×2): qty 1

## 2022-04-29 MED ORDER — SODIUM CHLORIDE 0.9 % IV BOLUS
500.0000 mL | Freq: Once | INTRAVENOUS | Status: AC
  Administered 2022-04-29: 500 mL via INTRAVENOUS

## 2022-04-29 MED ORDER — LAMOTRIGINE 25 MG PO TABS: 25.0000 mg | ORAL_TABLET | Freq: Every evening | ORAL | Status: DC

## 2022-04-29 MED ORDER — LAMOTRIGINE 25 MG PO TABS: 50.0000 mg | ORAL_TABLET | Freq: Two times a day (BID) | ORAL | Status: DC

## 2022-04-29 MED ORDER — LAMOTRIGINE 25 MG PO TABS: 25.0000 mg | ORAL_TABLET | Freq: Two times a day (BID) | ORAL | Status: DC

## 2022-04-29 MED ORDER — SODIUM CHLORIDE 0.9 % IV SOLN: Freq: Once | INTRAVENOUS | Status: DC

## 2022-04-29 MED ORDER — CALCIUM GLUCONATE-NACL 2-0.675 GM/100ML-% IV SOLN
2.0000 g | Freq: Once | INTRAVENOUS | Status: AC
  Administered 2022-04-29: 2000 mg via INTRAVENOUS
  Filled 2022-04-29: qty 100

## 2022-04-29 MED ORDER — GUAIFENESIN 100 MG/5ML PO LIQD: 5.0000 mL | ORAL | Status: DC | PRN

## 2022-04-29 MED ORDER — LAMOTRIGINE 100 MG PO TABS: 100.0000 mg | ORAL_TABLET | Freq: Every morning | ORAL | Status: DC

## 2022-04-29 MED ORDER — LAMOTRIGINE 25 MG PO TABS: 75.0000 mg | ORAL_TABLET | Freq: Every morning | ORAL | Status: DC

## 2022-04-29 MED ORDER — LEVETIRACETAM 500 MG PO TABS
500.0000 mg | ORAL_TABLET | Freq: Two times a day (BID) | ORAL | Status: DC
  Administered 2022-04-29: 500 mg via ORAL
  Filled 2022-04-29 (×2): qty 1

## 2022-04-29 MED ORDER — PANTOPRAZOLE SODIUM 40 MG PO TBEC
40.0000 mg | DELAYED_RELEASE_TABLET | Freq: Every day | ORAL | Status: DC
  Administered 2022-04-30 – 2022-05-03 (×4): 40 mg via ORAL
  Filled 2022-04-29 (×5): qty 1

## 2022-04-29 MED ORDER — LAMOTRIGINE 25 MG PO TABS: 75.0000 mg | ORAL_TABLET | Freq: Two times a day (BID) | ORAL | Status: DC

## 2022-04-29 MED ORDER — SODIUM CHLORIDE 0.9 % IV SOLN: INTRAVENOUS | Status: DC

## 2022-04-29 MED ORDER — HYDRALAZINE HCL 20 MG/ML IJ SOLN: 10.0000 mg | INTRAMUSCULAR | Status: DC | PRN

## 2022-04-29 MED ORDER — LORAZEPAM 1 MG PO TABS: 1.0000 mg | ORAL_TABLET | ORAL | Status: AC | PRN

## 2022-04-29 MED ORDER — LAMOTRIGINE 25 MG PO TABS: 50.0000 mg | ORAL_TABLET | Freq: Every day | ORAL | Status: DC

## 2022-04-29 MED ORDER — CLOPIDOGREL BISULFATE 75 MG PO TABS
75.0000 mg | ORAL_TABLET | Freq: Every day | ORAL | Status: DC
  Administered 2022-04-30 – 2022-05-03 (×4): 75 mg via ORAL
  Filled 2022-04-29 (×5): qty 1

## 2022-04-29 MED ORDER — METOPROLOL TARTRATE 5 MG/5ML IV SOLN: 5.0000 mg | INTRAVENOUS | Status: DC | PRN

## 2022-04-29 MED ORDER — ADULT MULTIVITAMIN W/MINERALS CH
1.0000 | ORAL_TABLET | Freq: Every day | ORAL | Status: DC
  Administered 2022-04-30 – 2022-05-03 (×4): 1 via ORAL
  Filled 2022-04-29 (×5): qty 1

## 2022-04-29 MED ORDER — FOLIC ACID 1 MG PO TABS
1.0000 mg | ORAL_TABLET | Freq: Every day | ORAL | Status: DC
  Administered 2022-04-29 – 2022-05-03 (×5): 1 mg via ORAL
  Filled 2022-04-29 (×5): qty 1

## 2022-04-29 MED ORDER — LORAZEPAM 2 MG/ML IJ SOLN: 1.0000 mg | INTRAMUSCULAR | Status: AC | PRN

## 2022-04-29 MED ORDER — LEVETIRACETAM IN NACL 1000 MG/100ML IV SOLN: 1000.0000 mg | Freq: Two times a day (BID) | INTRAVENOUS | Status: DC

## 2022-04-29 MED ORDER — LEVETIRACETAM IN NACL 1000 MG/100ML IV SOLN
1000.0000 mg | Freq: Two times a day (BID) | INTRAVENOUS | Status: DC
  Administered 2022-04-29: 1000 mg via INTRAVENOUS
  Filled 2022-04-29 (×3): qty 100

## 2022-04-29 MED ORDER — LAMOTRIGINE 25 MG PO TABS: 75.0000 mg | ORAL_TABLET | Freq: Every evening | ORAL | Status: DC

## 2022-04-29 MED ORDER — IPRATROPIUM-ALBUTEROL 0.5-2.5 (3) MG/3ML IN SOLN: 3.0000 mL | RESPIRATORY_TRACT | Status: DC | PRN

## 2022-04-29 MED ORDER — ASPIRIN 81 MG PO TBEC
81.0000 mg | DELAYED_RELEASE_TABLET | Freq: Every day | ORAL | Status: DC
  Administered 2022-04-30 – 2022-05-03 (×4): 81 mg via ORAL
  Filled 2022-04-29 (×5): qty 1

## 2022-04-29 MED ORDER — LAMOTRIGINE 25 MG PO TABS
25.0000 mg | ORAL_TABLET | Freq: Every morning | ORAL | Status: DC
  Administered 2022-04-29: 25 mg via ORAL
  Filled 2022-04-29 (×2): qty 1

## 2022-04-29 MED ORDER — CYANOCOBALAMIN 500 MCG PO TABS
500.0000 ug | ORAL_TABLET | Freq: Every day | ORAL | Status: DC
  Administered 2022-05-01 – 2022-05-03 (×3): 500 ug via ORAL
  Filled 2022-04-29 (×5): qty 1

## 2022-04-29 MED ORDER — SODIUM CHLORIDE 3 % IV SOLN
INTRAVENOUS | Status: DC
  Administered 2022-04-30: 30 mL/h via INTRAVENOUS
  Filled 2022-04-29 (×6): qty 500

## 2022-04-29 MED ORDER — ATORVASTATIN CALCIUM 10 MG PO TABS
20.0000 mg | ORAL_TABLET | Freq: Every day | ORAL | Status: DC
  Administered 2022-04-30 – 2022-05-03 (×4): 20 mg via ORAL
  Filled 2022-04-29 (×5): qty 2

## 2022-04-29 MED ORDER — ENOXAPARIN SODIUM 40 MG/0.4ML IJ SOSY
40.0000 mg | PREFILLED_SYRINGE | INTRAMUSCULAR | Status: DC
  Administered 2022-04-29 – 2022-05-03 (×5): 40 mg via SUBCUTANEOUS
  Filled 2022-04-29 (×5): qty 0.4

## 2022-04-29 MED ORDER — LEVETIRACETAM 500 MG PO TABS
1000.0000 mg | ORAL_TABLET | Freq: Two times a day (BID) | ORAL | Status: DC
  Administered 2022-04-30: 1000 mg via ORAL
  Filled 2022-04-29 (×3): qty 2

## 2022-04-29 MED ORDER — LAMOTRIGINE 100 MG PO TABS: 100.0000 mg | ORAL_TABLET | Freq: Two times a day (BID) | ORAL | Status: DC

## 2022-04-29 MED ORDER — ONDANSETRON HCL 4 MG/2ML IJ SOLN: 4.0000 mg | Freq: Four times a day (QID) | INTRAMUSCULAR | Status: DC | PRN

## 2022-04-29 MED ORDER — CHLORHEXIDINE GLUCONATE CLOTH 2 % EX PADS
6.0000 | MEDICATED_PAD | Freq: Every day | CUTANEOUS | Status: DC
  Administered 2022-04-30 – 2022-05-02 (×2): 6 via TOPICAL

## 2022-04-29 MED ORDER — THIAMINE MONONITRATE 100 MG PO TABS
100.0000 mg | ORAL_TABLET | Freq: Every day | ORAL | Status: DC
  Administered 2022-05-03: 100 mg via ORAL
  Filled 2022-04-29 (×2): qty 1

## 2022-04-29 MED ORDER — ACETAMINOPHEN 325 MG PO TABS: 650.0000 mg | ORAL_TABLET | Freq: Four times a day (QID) | ORAL | Status: DC | PRN

## 2022-04-29 MED ORDER — SENNOSIDES-DOCUSATE SODIUM 8.6-50 MG PO TABS: 1.0000 | ORAL_TABLET | Freq: Every evening | ORAL | Status: DC | PRN

## 2022-04-29 MED ORDER — LEVETIRACETAM 500 MG PO TABS: 1000.0000 mg | ORAL_TABLET | Freq: Two times a day (BID) | ORAL | Status: DC

## 2022-04-29 MED ORDER — TRAZODONE HCL 50 MG PO TABS: 50.0000 mg | ORAL_TABLET | Freq: Every evening | ORAL | Status: DC | PRN

## 2022-04-29 MED ORDER — DOCUSATE SODIUM 100 MG PO CAPS: 100.0000 mg | ORAL_CAPSULE | Freq: Two times a day (BID) | ORAL | Status: DC | PRN

## 2022-04-29 MED ORDER — GABAPENTIN 300 MG PO CAPS
300.0000 mg | ORAL_CAPSULE | Freq: Every day | ORAL | Status: DC
  Administered 2022-04-30 – 2022-05-02 (×3): 300 mg via ORAL
  Filled 2022-04-29 (×4): qty 1

## 2022-04-29 MED ORDER — THIAMINE HCL 100 MG/ML IJ SOLN
100.0000 mg | Freq: Every day | INTRAMUSCULAR | Status: DC
  Administered 2022-04-29 – 2022-05-02 (×4): 100 mg via INTRAVENOUS
  Filled 2022-04-29 (×4): qty 2

## 2022-04-29 MED ORDER — LAMOTRIGINE 25 MG PO TABS: 50.0000 mg | ORAL_TABLET | Freq: Every evening | ORAL | Status: DC

## 2022-04-29 NOTE — Consult Note (Signed)
NAME:  Carlos Wise, MRN:  [id number], DOB:  14-Apr-1941, LOS: 0 ADMISSION DATE:  04/29/2022, CONSULTATION DATE:  04/29/22 REFERRING MD:  Reesa Chew, CHIEF COMPLAINT:  Hyponatremia, AMS   History of Present Illness:  Carlos Wise is a 81 y.o. M with PMH HTN, HL, prostate Ca, chronic hyponatremia, seizure disorder who presented with 2 days of generalized weakness and fall from standing at home.  He has a caregiver who gives a history of chronic hyponatremia in the 120's.  He also drinks 8 oz gin and tonic daily.   He was recently admitted in October at [facility] with encephalopathy and left corona radiata acute lacunar infarction.  His Keppra was increased at that time, however it was felt that this was making him agitated and so started on Trileptal in addition to [place].  His caregiver reports starting this two days before the onset  weakness and nausea.  He was not confused, but was low to answer questions.  On arrival to the ED he was hemodynamically stable, head CT negative and initial sodium 117.  He was seen by neurology, concern for oxcarbazepine induced hyponatremia and recommended hypertonic saline, so PCCM consulted for ICU admission  Pertinent  Medical History   has a past medical history of Herpes zoster, Hyperlipemia, Hypertension, Intestinal polyp, Prostate cancer ([place]), S/P radiation therapy (12/12/2014 through 02/06/2015                                                   ), and Seizure disorder ([place]).   Significant Hospital Events: Including procedures, antibiotic start and stop dates in addition to other pertinent events   11/15 admit to PCCM for hypertonic saline  Interim History / Subjective:  Pt awake and eating, at mental status baseline per caregiver and has no complaints  Objective   Blood pressure (!) 166/87, pulse 83, temperature 98.5 F (36.9 C), temperature source Oral, resp. rate 18, height '5\' 8"'$  (1.727 m), weight 79.4 kg, SpO2 97 %.       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 and ETOH -follow urine studies and serum osms -TSH, B12, folate and ammonia pending -monitor for seizure or neuro changes -q4hr BMP's -hypertonic saline per nephrology    History of Seizures  -Placed on Lamictal and Keppra by neuro -seizure precautions   ETOH use  8 oz gin and tonic daily -monitor for signs of withdrawal -MC, folic acid and thiamine  Hx CVA  HTN -continue Asa and statin -prn hydralazine and metoprolol   GERD -continue PPI  Peripheral Neuropathy -continue home Gabapentin   Best Practice (right click and "Reselect all SmartList Selections" daily)   Diet/type: Regular consistency (see orders) DVT prophylaxis: LMWH GI prophylaxis: PPI Lines: N/A Foley:  N/A Code Status:  full code Last date of multidisciplinary goals of care discussion [pending, pt and care giver updated at the bedside]  Labs   CBC: Recent Labs  Lab 04/29/22 1036 04/29/22 1059  WBC 10.0  --   NEUTROABS 8.0*  --   HGB 13.0 13.6  HCT 34.7* 40.0  MCV 85.7  --   PLT 252  --     Basic Metabolic Panel: Recent Labs  Lab 04/29/22 1036 04/29/22 1059  NA 117* 114*  K 3.8 3.9  CL 81* 81*  CO2 21*  --   GLUCOSE 103* 100*  BUN 6* 6*  CREATININE 0.71 0.60*  CALCIUM 9.3  --    GFR: Estimated Creatinine Clearance: 71.3 mL/min (A) (by C-G formula based on SCr of 0.6 mg/dL  (L)). Recent Labs  Lab 04/29/22 1036  WBC 10.0    Liver Function Tests: Recent Labs  Lab 04/29/22 1036  AST 41  ALT 16  ALKPHOS 63  BILITOT 1.2  PROT 6.8  ALBUMIN 4.1   No results for input(s): "LIPASE", "AMYLASE" in the last 168 hours. No results for input(s): "AMMONIA" in the last 168 hours.  ABG    Component Value Date/Time   HCO3 22.4 11/22/2021 1359   TCO2 24 04/29/2022 1059   O2SAT 100 11/22/2021 1359     Coagulation Profile: Recent Labs  Lab 04/29/22 1036  INR 1.1    Cardiac Enzymes: No results for input(s): "CKTOTAL", "CKMB", "CKMBINDEX", "TROPONINI" in the last 168 hours.  HbA1C: Hgb A1c MFr Bld  Date/Time Value Ref Range Status  03/02/2022 05:07 AM 5.1 4.8 - 5.6 % Final    Comment:    (NOTE) Pre diabetes:          5.7%-6.4%  Diabetes:              >6.4%  Glycemic control for   <7.0% adults with diabetes   02/04/2020 02:19 PM 5.6 4.8 - 5.6 % Final    Comment:    (NOTE) Pre diabetes:          5.7%-6.4%  Diabetes:              >6.4%  Glycemic control for   <7.0% adults with diabetes     CBG: No results for input(s): "GLUCAP" in the last 168 hours.  Review of Systems:   Please see the history of present illness. All other systems reviewed and are negative    Past Medical History:  He,  has a past medical history of Herpes zoster, Hyperlipemia, Hypertension, Intestinal polyp, Prostate cancer ([place]), S/P radiation therapy (12/12/2014 through 02/06/2015                                                   ), and Seizure disorder ([place]).   Surgical History:   Past Surgical History:  Procedure Laterality Date   Carotid Artery Catherization     PROSTATE BIOPSY  06/27/2014     Social History:   reports that he has quit smoking. He has never used smokeless tobacco. He reports current alcohol use. He reports that he does not use drugs.   Family History:  His family history includes Colon cancer in his mother; Heart attack in his father;  Prostate cancer in his father.   Allergies Allergies  Allergen Reactions   Oxcarbazepine     Significant and  severe hyponatremia noted within a few days of starting Oxcarbazepine.   Penicillins Other (See Comments)    Patient states no allergy, but has preference that he does not want this medication     Home Medications  Prior to Admission medications   Medication Sig Start Date End Date Taking? Authorizing Provider  aspirin EC 81 MG tablet Take 81 mg by mouth daily. Swallow whole.   Yes [provider]  atorvastatin (LIPITOR) 20 MG tablet Take 20 mg by mouth daily.   Yes [provider]  Cholecalciferol (VITAMIN D3 PO) Take 1 capsule by mouth daily.   Yes [provider]  clobetasol cream (TEMOVATE) 9.62 % Apply 1 Application topically daily as needed (itching scalp).   Yes [provider]  clopidogrel (PLAVIX) 75 MG tablet Take 1 tablet (75 mg total) by mouth daily. 04/09/22  Yes Camara, Maryan Puls, MD  Cyanocobalamin (VITAMIN B-12 PO) Take 1 tablet by mouth daily.   Yes [provider]  escitalopram (LEXAPRO) 10 MG tablet Take 10 mg by mouth daily.   Yes [provider]  fluticasone (FLONASE) 50 MCG/ACT nasal spray Place 1 spray into both nostrils daily as needed for allergies. 12/10/19  Yes [provider]  folic acid (FOLVITE) 1 MG tablet Take 1 mg by mouth daily.   Yes [provider]  gabapentin (NEURONTIN) 300 MG capsule Take 300 mg by mouth at bedtime.   Yes [provider]  ketotifen (ALLERGY EYE DROPS) 0.025 % ophthalmic solution Place 1 drop into both eyes daily as needed (red eyes).   Yes [provider]  levETIRAcetam (KEPPRA) 500 MG tablet Take 1,000 mg by mouth 2 (two) times daily. 04/21/22  Yes [provider]  Naproxen Sod-diphenhydrAMINE (ALEVE PM) 220-25 MG TABS Take 2 tablets by mouth at bedtime.   Yes [provider]  pantoprazole (PROTONIX) 40 MG tablet Take 40 mg  by mouth daily.   Yes [provider]  Pseudoeph-Doxylamine-DM-APAP (NYQUIL PO) Take 2 capsules by mouth at bedtime as needed (sleep when aleve pm is no working).   Yes [provider]  sodium chloride (OCEAN) 0.65 % SOLN nasal spray Place 1 spray into both nostrils daily as needed for congestion.   Yes [provider]  sodium chloride 1 g tablet Take 1 g by mouth 3 (three) times daily.   Yes [provider]  thiamine (VITAMIN B1) 100 MG tablet Take 250 mg by mouth daily. 02/16/22  Yes [provider]  triamcinolone cream (KENALOG) 0.1 % Apply 1 Application topically daily as needed (itching skin).   Yes [provider]  atorvastatin (LIPITOR) 80 MG tablet Take 1 tablet (80 mg total) by mouth daily. Patient not taking: Reported on 04/29/2022 03/25/22   Caren Griffins, MD     Critical care time: 35 minutes    CRITICAL CARE Performed by: Otilio Carpen Jhovani Griswold   Total critical care time: 35 minutes  Critical care time was exclusive of separately billable procedures and treating other patients.  Critical care was necessary to treat or prevent imminent or life-threatening deterioration.  Critical care was time spent personally by me on the following activities: development of treatment plan with patient and/or surrogate as well as nursing, discussions with consultants, evaluation of patient's response to treatment, examination of patient, obtaining history from patient or surrogate, ordering and performing treatments and interventions, ordering and review of laboratory studies, ordering and review of radiographic studies, pulse oximetry and re-evaluation of patient's condition.  Otilio Carpen Karina Lenderman, PA-C [facility] See Amion for pager If no response to pager , please call [phone] until 7pm After 7:00 pm call Elink  [phone]

## 2022-04-29 NOTE — Telephone Encounter (Signed)
I called pt's caregiver back. She reports last night and into the morning pt was unsteady on his feet and fell twice. This morning he reported weakness and dizziness. Symptoms progressed as the morning went on. [facility] called EMS and pt transported to ER. He is at ER at present.   [name] sts pt's seizure meds are likely to be changed but note sure on the dosage, ER is also ruling out UTI.  I advised I would update MD on this and we could schedule f/u once d/c.  She verbalized understanding and appreciation for the call.

## 2022-04-29 NOTE — Code Documentation (Signed)
Stroke Response Nurse Documentation Code Documentation  JANSSEN ZEE is a 81 y.o. male arriving to [facility]  via [facility] on 04/29/2022 with past medical hx of Seizure. Code stroke was activated by ED.   Patient from home where he was LKW at 2-3 days ago due to generalized weakness. The patient then fell at 2230 last night and 0400 this morning. Caregiver called EMS this morning due to left sided weakness and confusion. Pt taken to triage and PA evaluated.   Stroke team at the bedside on patient arrival. Labs drawn and patient cleared for CT by PA Sage. Patient to CT with team. NIHSS 0, see documentation for details and code stroke times. The following imaging was completed:  CT Head. Patient is not a candidate for IV Thrombolytic due to outside window and stroke not suspected. Patient is not a candidate for IR due to no LVO suspected.   Care Plan: Code Stroke cancelled.   Bedside handoff with ED RN Donia Guiles.    Kathrin Greathouse  Stroke Response RN

## 2022-04-29 NOTE — Telephone Encounter (Signed)
OK, please make sure he is taking 1/2 tablet and not the full tablet

## 2022-04-29 NOTE — ED Provider Notes (Signed)
[place] EMERGENCY DEPARTMENT Provider Note   CSN: [id number] Arrival date & time: 04/29/22  1010  An emergency department physician performed an initial assessment on this suspected stroke patient at 1000.  History  Chief Complaint  Patient presents with   Code Stroke    Carlos Wise is a 81 y.o. male.  HPI   Patient has a history of prostate cancer, hyperlipidemia, hypertension, seizure disorder.  He was brought in to the ED for evaluation after having 2 falls last night.  Reports are that he may have also had a seizure but the patient is not able to confirm that.  He does not know why he fell.  He denies any complaints right now.  He denies any headache.  No weakness.  Patient was activated as a code stroke on arrival.  He was seen by the stroke team including Dr. Jana Half.  Code stroke was deactivated  Home Medications Prior to Admission medications   Medication Sig Start Date End Date Taking? Authorizing Provider  aspirin EC 81 MG tablet Take 81 mg by mouth daily. Swallow whole.    [provider]  atorvastatin (LIPITOR) 80 MG tablet Take 1 tablet (80 mg total) by mouth daily. Patient taking differently: Take 20 mg by mouth daily. 03/25/22   Caren Griffins, MD  Cholecalciferol (VITAMIN D3 PO) Take 1 capsule by mouth daily.    [provider]  clopidogrel (PLAVIX) 75 MG tablet Take 1 tablet (75 mg total) by mouth daily. 04/09/22   Alric Ran, MD  Cyanocobalamin (VITAMIN B-12 PO) Take 1 tablet by mouth daily.    [provider]  escitalopram (LEXAPRO) 10 MG tablet Take 10 mg by mouth daily.    [provider]  fluticasone (FLONASE) 50 MCG/ACT nasal spray Place 1 spray into both nostrils daily. 12/10/19   [provider]  folic acid (FOLVITE) 1 MG tablet Take 1 mg by mouth daily.    [provider]  gabapentin (NEURONTIN) 300 MG capsule Take 300 mg by mouth at bedtime.    [provider]   ketotifen (ALLERGY EYE DROPS) 0.025 % ophthalmic solution Place 1 drop into both eyes daily as needed (red eyes).    [provider]  Naproxen Sod-diphenhydrAMINE (ALEVE PM) 220-25 MG TABS Take 2 tablets by mouth at bedtime.    [provider]  pantoprazole (PROTONIX) 40 MG tablet Take 40 mg by mouth daily.    [provider]  Pseudoeph-Doxylamine-DM-APAP (NYQUIL PO) Take 2 capsules by mouth at bedtime as needed (sleep when aleve pm is no working).    [provider]  sodium chloride (OCEAN) 0.65 % SOLN nasal spray Place 1 spray into both nostrils daily as needed for congestion.    [provider]  sodium chloride 1 g tablet Take 1 g by mouth 3 (three) times daily.    [provider]  thiamine (VITAMIN B1) 100 MG tablet Take 100 mg by mouth daily. 02/16/22   [provider]      Allergies    Oxcarbazepine and Penicillins    Review of Systems   Review of Systems  Physical Exam Updated Vital Signs BP (!) 161/85   Pulse 79   Temp 98.4 F (36.9 C) (Oral)   Resp 16   Ht 1.727 m ('5\' 8"'$ )   Wt 79.4 kg   SpO2 97%   BMI 26.61 kg/m  Physical Exam Vitals and nursing note reviewed.  Constitutional:  Appearance: He is well-developed. He is not diaphoretic.  HENT:     Head: Normocephalic and atraumatic.     Right Ear: External ear normal.     Left Ear: External ear normal.  Eyes:     General: No scleral icterus.       Right eye: No discharge.        Left eye: No discharge.     Conjunctiva/sclera: Conjunctivae normal.  Neck:     Trachea: No tracheal deviation.  Cardiovascular:     Rate and Rhythm: Normal rate and regular rhythm.  Pulmonary:     Effort: Pulmonary effort is normal. No respiratory distress.     Breath sounds: Normal breath sounds. No stridor. No wheezing or rales.  Abdominal:     General: Bowel sounds are normal. There is no distension.     Palpations: Abdomen is soft.     Tenderness: There is no  abdominal tenderness. There is no guarding or rebound.  Musculoskeletal:        General: No tenderness or deformity.     Cervical back: Neck supple.  Skin:    General: Skin is warm and dry.     Findings: No rash.  Neurological:     General: No focal deficit present.     Mental Status: He is alert.     Cranial Nerves: No cranial nerve deficit (no facial droop, extraocular movements intact,).     Sensory: No sensory deficit.     Motor: No weakness, abnormal muscle tone or seizure activity.     Coordination: Coordination normal.     Comments: Speech is slow but no dysarthria, alert, follows commands  Psychiatric:        Mood and Affect: Mood normal.     ED Results / Procedures / Treatments   Labs (all labs ordered are listed, but only abnormal results are displayed) Labs Reviewed  CBC - Abnormal; Notable for the following components:      Result Value   RBC 4.05 (*)    HCT 34.7 (*)    MCHC 37.5 (*)    All other components within normal limits  DIFFERENTIAL - Abnormal; Notable for the following components:   Neutro Abs 8.0 (*)    All other components within normal limits  COMPREHENSIVE METABOLIC PANEL - Abnormal; Notable for the following components:   Sodium 117 (*)    Chloride 81 (*)    CO2 21 (*)    Glucose, Bld 103 (*)    BUN 6 (*)    All other components within normal limits  I-STAT CHEM 8, ED - Abnormal; Notable for the following components:   Sodium 114 (*)    Chloride 81 (*)    BUN 6 (*)    Creatinine, Ser 0.60 (*)    Glucose, Bld 100 (*)    Calcium, Ion 1.08 (*)    All other components within normal limits  ETHANOL  PROTIME-INR  APTT  RAPID URINE DRUG SCREEN, HOSP PERFORMED  URINALYSIS, ROUTINE W REFLEX MICROSCOPIC  CARBAMAZEPINE LEVEL, TOTAL  SODIUM, URINE, RANDOM  OSMOLALITY, URINE  OSMOLALITY  BASIC METABOLIC PANEL  BASIC METABOLIC PANEL  TSH  VITAMIN B12  AMMONIA  CBC  CREATININE, SERUM  URINALYSIS, ROUTINE W REFLEX MICROSCOPIC    EKG EKG  Interpretation  Date/Time:  Wednesday April 29 2022 11:00:24 EST Ventricular Rate:  77 PR Interval:  152 QRS Duration: 82 QT Interval:  501 QTC Calculation: 568 R Axis:   49 Text Interpretation: Sinus  rhythm Borderline T abnormalities, anterior leads Prolonged QT interval Artifact in lead(s) I II III aVR aVL aVF No significant change since last tracing Confirmed by Dorie Rank [phone]) on 04/29/2022 11:11:58 AM  Radiology CT HEAD CODE STROKE WO CONTRAST  Result Date: 04/29/2022 CLINICAL DATA:  Code stroke. Acute neuro deficit. Altered mental status, weakness EXAM: CT HEAD WITHOUT CONTRAST TECHNIQUE: Contiguous axial images were obtained from the base of the skull through the vertex without intravenous contrast. RADIATION DOSE REDUCTION: This exam was performed according to the departmental dose-optimization program which includes automated exposure control, adjustment of the mA and/or kV according to patient size and/or use of iterative reconstruction technique. COMPARISON:  CT head 03/09/2022 FINDINGS: Brain: Generalized atrophy and ventricular enlargement, stable. Ventricular enlargement consistent with atrophy. Moderate white matter hypodensity bilaterally is symmetric and unchanged. Negative for acute infarct, hemorrhage, mass Vascular: Negative for hyperdense vessel. 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 Skull: Negative Sinuses/Orbits: Mild mucosal edema paranasal sinuses. Negative orbit Other: None ASPECTS ([place] Stroke Program Early CT Score) - Ganglionic level infarction (caudate, lentiform nuclei, internal capsule, insula, M1-M3 cortex): 7 - Supraganglionic infarction (M4-M6 cortex): 3 Total score (0-10 with 10 being normal): 10 IMPRESSION: 1. No acute abnormality. Atrophy and chronic microvascular ischemia. 2. Aspects is 10. 3. Code stroke imaging results were communicated on 04/29/2022 at 10:59 am to provider [facility] via Amion app Electronically Signed   By: Franchot Gallo M.D.   On: 04/29/2022 10:59    Procedures Procedures    Medications Ordered in ED Medications  lamoTRIgine (LAMICTAL) tablet 25 mg (25 mg Oral Given 04/29/22 1304)    Followed by  lamoTRIgine (LAMICTAL) tablet 25 mg (has no administration in time range)    Followed by  lamoTRIgine (LAMICTAL) tablet 50 mg (has no administration in time range)  lamoTRIgine (LAMICTAL) tablet 25 mg (has no administration in time range)  lamoTRIgine (LAMICTAL) tablet 50 mg (has no administration in time range)    Followed by  lamoTRIgine (LAMICTAL) tablet 75 mg (has no administration in time range)  lamoTRIgine (LAMICTAL) tablet 50 mg (has no administration in time range)  lamoTRIgine (LAMICTAL) tablet 75 mg (has no administration in time range)    Followed by  lamoTRIgine (LAMICTAL) tablet 100 mg (has no administration in time range)  lamoTRIgine (LAMICTAL) tablet 75 mg (has no administration in time range)  lamoTRIgine (LAMICTAL) tablet 100 mg (has no administration in time range)  levETIRAcetam (KEPPRA) IVPB 1000 mg/100 mL premix (has no administration in time range)    Or  levETIRAcetam (KEPPRA) tablet 1,000 mg (has no administration in time range)  levETIRAcetam (KEPPRA) tablet 500 mg (500 mg Oral Given 04/29/22 1305)  ipratropium-albuterol (DUONEB) 0.5-2.5 (3) MG/3ML nebulizer solution 3 mL (has no administration in time range)  hydrALAZINE (APRESOLINE) injection 10 mg (has no administration in time range)  metoprolol tartrate (LOPRESSOR) injection 5 mg (has no administration in time range)  senna-docusate (Senokot-S) tablet 1 tablet (has no administration in time range)  guaiFENesin (ROBITUSSIN) 100 MG/5ML liquid 5 mL (has no administration in time range)  traZODone (DESYREL) tablet 50 mg (has no administration in time range)  acetaminophen (TYLENOL) tablet 650 mg (has no administration in time range)  ondansetron (ZOFRAN) injection 4 mg (has no administration in time range)  0.9 %  sodium  chloride infusion (has no administration in time range)  aspirin EC tablet 81 mg (has no administration in time range)  atorvastatin (LIPITOR) tablet 20 mg (has no administration in time  range)  pantoprazole (PROTONIX) EC tablet 40 mg (has no administration in time range)  clopidogrel (PLAVIX) tablet 75 mg (has no administration in time range)  folic acid (FOLVITE) tablet 1 mg (has no administration in time range)  cyanocobalamin (VITAMIN B12) tablet 500 mcg (has no administration in time range)  gabapentin (NEURONTIN) capsule 300 mg (has no administration in time range)  enoxaparin (LOVENOX) injection 40 mg (has no administration in time range)  calcium gluconate 2 g/ 100 mL sodium chloride IVPB (has no administration in time range)  LORazepam (ATIVAN) tablet 1-4 mg (has no administration in time range)    Or  LORazepam (ATIVAN) injection 1-4 mg (has no administration in time range)  thiamine (VITAMIN B1) tablet 100 mg (has no administration in time range)    Or  thiamine (VITAMIN B1) injection 100 mg (has no administration in time range)  multivitamin with minerals tablet 1 tablet (has no administration in time range)  sodium chloride 0.9 % bolus 500 mL (500 mLs Intravenous Bolus 04/29/22 1303)    ED Course/ Medical Decision Making/ A&P Clinical Course as of 04/29/22 1308  Wed Apr 29, 2022  1101 istat shows a sodium level of 114, chloride decreased.  Will correlate with the metabolic panel.   [JK]  1107 CBC(!) Normal [JK]  1145 Dr. Ernestina [place] was able to obtain additional information.  Patient's seizure medications were recently changed. [JK]  9798 Metabolic panel shows hyponatremia with a sodium of 117.  This is new compared to previous values.  Could be related to his recent Tegretol [JK]  1147 Head CT without acute findings [JK]  [place] Case disucssed with Dr Reesa Chew regarding admission [JK]    Clinical Course User Index [JK] Dorie Rank, MD                           Medical  Decision Making Problems Addressed: Fall, initial encounter: acute illness or injury Hyponatremia: acute illness or injury that poses a threat to life or bodily functions Seizure disorder [facility]): chronic illness or injury  Amount and/or Complexity of Data Reviewed Labs: ordered. Decision-making details documented in ED Course. Radiology: ordered and independent interpretation performed. Decision-making details documented in ED Course. Discussion of management or test interpretation with external provider(s): Dr Luciana Axe, Dr Reesa Chew  Risk Prescription drug management. Decision regarding hospitalization.   Patient presented to the ED for evaluation of weakness recent falls, possible seizure.  Initially activated as code stroke.  Patient evaluated by neurology team and Dr. Jana Half.  Patient not felt to have an acute stroke presentation and code stroke was deactivated.  Patient is alert answering questions appropriately.  Patient's labs however are significant for new hyponatremia.  This certainly could be contributing to his falls and weakness.  No seizures at this time.  We will hold off on hypertonic saline but monitor closely.  Patient started on IV saline.  May need to consider discontinuation of his Tegretol        Final Clinical Impression(s) / ED Diagnoses Final diagnoses:  Hyponatremia  Seizure disorder ([place])  Fall, initial encounter    Rx / DC Orders ED Discharge Orders     None         Dorie Rank, MD 04/29/22 1308

## 2022-04-29 NOTE — ED Triage Notes (Addendum)
EMS stated, family stated h has been weak in the last 3-4 days. No other complaints. He fell 2 times yesterday at 1030pm and 400am . No head injury, no pain anywhere.  Family stated when h gets weak he has seizures and that's why they sent him

## 2022-04-29 NOTE — H&P (Addendum)
History and Physical    Carlos Wise SNK:[id number] DOB: 05-01-1941 DOA: 04/29/2022  PCP: [name], MD Patient coming from: Home  Chief Complaint: Change in mental status and weakness  HPI: Carlos Wise is a 81 y.o. male with medical history significant of alcohol abuse, adult failure to thrive, history of seizure, GERD, recent CVA/concussion comes to the hospital for evaluation of generalized weakness and change in mental status.  Patient was recently admitted here at [facility] and discharged on October 11 after being treated for encephalopathy, acute failure to thrive and stroke.  At that time his Keppra was increased but during outpatient follow-up his neurology determined that it was making him agitated therefore switched him to [place] which patient was unable to afford and therefore started oxy carbamazepine about 4 days ago along with continuing [place].  Since then patient has been extremely weak but poor oral intake.  His responses have been very sluggish.  Due to these concerns and after his caregiver communicating several times with his outpatient neurologist patient was brought to the hospital for further management.  In the ED CT of the head was negative, initial sodium was 117 and later 114.  He was seen by neurologist due to concerns of CVA but after being eval by neurology he was started on seizure medicine and there was no concerns for CVA.  Patient is being admitted to the hospital. When I saw the patient he was overall stable and did not have any complaints but his responses were quite sluggish and slow.  Caregiver was present at bedside.  Patient continues to smoke half a pack of cigarettes daily, drinks about 8 ounces of gin and tonic daily, denies any illicit drug use CODE STATUS-full   Review of Systems: As per HPI otherwise 10 point review of systems negative.  Review of Systems Otherwise negative except as per HPI, including: General: Denies fever, chills,  night sweats or unintended weight loss. Resp: Denies cough, wheezing, shortness of breath. Cardiac: Denies chest pain, palpitations, orthopnea, paroxysmal nocturnal dyspnea. GI: Denies abdominal pain, nausea, vomiting, diarrhea or constipation GU: Denies dysuria, frequency, hesitancy or incontinence MS: Denies muscle aches, joint pain or swelling Neuro: Denies headache, neurologic deficits (focal weakness, numbness, tingling), abnormal gait Psych: Denies anxiety, depression, SI/HI/AVH Skin: Denies new rashes or lesions ID: Denies sick contacts, exotic exposures, travel  Past Medical History:  Diagnosis Date   Herpes zoster    Hyperlipemia    Hypertension    Intestinal polyp    hyperplastic   Prostate cancer ([place])    S/P radiation therapy 12/12/2014 through 02/06/2015                                                      Prostate 7800 cGy in 40 sessions, seminal vesicles 5600 cGy in 40 sessions                           Seizure disorder [facility])     Past Surgical History:  Procedure Laterality Date   Carotid Artery Catherization     PROSTATE BIOPSY  06/27/2014    SOCIAL HISTORY:  reports that he has quit smoking. He has never used smokeless tobacco. He reports current alcohol use. He reports that he does not use drugs.  Allergies  Allergen Reactions   Oxcarbazepine     Significant and severe hyponatremia noted within a few days of starting Oxcarbazepine.   Penicillins Other (See Comments)    Patient states no allergy, but has preference that he does not want this medication    FAMILY HISTORY: Family History  Problem Relation Age of Onset   Colon cancer Mother    Prostate cancer Father    Heart attack Father      Prior to Admission medications   Medication Sig Start Date End Date Taking? Authorizing Provider  aspirin EC 81 MG tablet Take 81 mg by mouth daily. Swallow whole.    [provider]  atorvastatin (LIPITOR) 80 MG tablet Take 1 tablet (80 mg total) by  mouth daily. Patient taking differently: Take 20 mg by mouth daily. 03/25/22   Caren Griffins, MD  Cholecalciferol (VITAMIN D3 PO) Take 1 capsule by mouth daily.    [provider]  clopidogrel (PLAVIX) 75 MG tablet Take 1 tablet (75 mg total) by mouth daily. 04/09/22   Alric Ran, MD  Cyanocobalamin (VITAMIN B-12 PO) Take 1 tablet by mouth daily.    [provider]  escitalopram (LEXAPRO) 10 MG tablet Take 10 mg by mouth daily.    [provider]  fluticasone (FLONASE) 50 MCG/ACT nasal spray Place 1 spray into both nostrils daily. 12/10/19   [provider]  folic acid (FOLVITE) 1 MG tablet Take 1 mg by mouth daily.    [provider]  gabapentin (NEURONTIN) 300 MG capsule Take 300 mg by mouth at bedtime.    [provider]  ketotifen (ALLERGY EYE DROPS) 0.025 % ophthalmic solution Place 1 drop into both eyes daily as needed (red eyes).    [provider]  Naproxen Sod-diphenhydrAMINE (ALEVE PM) 220-25 MG TABS Take 2 tablets by mouth at bedtime.    [provider]  pantoprazole (PROTONIX) 40 MG tablet Take 40 mg by mouth daily.    [provider]  Pseudoeph-Doxylamine-DM-APAP (NYQUIL PO) Take 2 capsules by mouth at bedtime as needed (sleep when aleve pm is no working).    [provider]  sodium chloride (OCEAN) 0.65 % SOLN nasal spray Place 1 spray into both nostrils daily as needed for congestion.    [provider]  sodium chloride 1 g tablet Take 1 g by mouth 3 (three) times daily.    [provider]  thiamine (VITAMIN B1) 100 MG tablet Take 100 mg by mouth daily. 02/16/22   [provider]    Physical Exam: Vitals:   04/29/22 1215 04/29/22 1230 04/29/22 1245 04/29/22 1300  BP: 136/88 (!) 163/92 (!) 151/130 (!) 161/85  Pulse: 80 78 78 79  Resp: '16 13 14 16  '$ Temp:      TempSrc:      SpO2: 99% 98% 98% 97%  Weight:      Height:          Constitutional: NAD, calm,  comfortable, sluggish in responses but answering appropriately overall Eyes: PERRL, lids and conjunctivae normal ENMT: Mucous membranes are dry. Posterior pharynx clear of any exudate or lesions.Normal dentition.  Neck: normal, supple, no masses, no thyromegaly Respiratory: clear to auscultation bilaterally, no wheezing, no crackles. Normal respiratory effort. No accessory muscle use.  Cardiovascular: Regular rate and rhythm, no murmurs / rubs / gallops. No extremity edema. 2+ pedal pulses. No carotid bruits.  Abdomen: no tenderness, no masses palpated. No hepatosplenomegaly. Bowel sounds positive.  Musculoskeletal: no clubbing /  cyanosis. No joint deformity upper and lower extremities. Good ROM, no contractures. Normal muscle tone.  Skin: no rashes, lesions, ulcers. No induration Neurologic: No focal neurodeficits.  Strength 5/5 Psychiatric: Overall sluggish in his responses but alert awake oriented X2.  Baseline alert awake oriented X3    Labs on Admission: I have personally reviewed following labs and imaging studies  CBC: Recent Labs  Lab 04/29/22 1036 04/29/22 1059  WBC 10.0  --   NEUTROABS 8.0*  --   HGB 13.0 13.6  HCT 34.7* 40.0  MCV 85.7  --   PLT 252  --    Basic Metabolic Panel: Recent Labs  Lab 04/29/22 1036 04/29/22 1059  NA 117* 114*  K 3.8 3.9  CL 81* 81*  CO2 21*  --   GLUCOSE 103* 100*  BUN 6* 6*  CREATININE 0.71 0.60*  CALCIUM 9.3  --    GFR: Estimated Creatinine Clearance: 71.3 mL/min (A) (by C-G formula based on SCr of 0.6 mg/dL (L)). Liver Function Tests: Recent Labs  Lab 04/29/22 1036  AST 41  ALT 16  ALKPHOS 63  BILITOT 1.2  PROT 6.8  ALBUMIN 4.1   No results for input(s): "LIPASE", "AMYLASE" in the last 168 hours. No results for input(s): "AMMONIA" in the last 168 hours. Coagulation Profile: Recent Labs  Lab 04/29/22 1036  INR 1.1   Cardiac Enzymes: No results for input(s): "CKTOTAL", "CKMB", "CKMBINDEX", "TROPONINI" in the last  168 hours. BNP (last 3 results) No results for input(s): "PROBNP" in the last 8760 hours. HbA1C: No results for input(s): "HGBA1C" in the last 72 hours. CBG: No results for input(s): "GLUCAP" in the last 168 hours. Lipid Profile: No results for input(s): "CHOL", "HDL", "LDLCALC", "TRIG", "CHOLHDL", "LDLDIRECT" in the last 72 hours. Thyroid Function Tests: No results for input(s): "TSH", "T4TOTAL", "FREET4", "T3FREE", "THYROIDAB" in the last 72 hours. Anemia Panel: No results for input(s): "VITAMINB12", "FOLATE", "FERRITIN", "TIBC", "IRON", "RETICCTPCT" in the last 72 hours. Urine analysis:    Component Value Date/Time   COLORURINE YELLOW 03/09/2022 1708   APPEARANCEUR CLEAR 03/09/2022 1708   LABSPEC <1.005 (L) 03/09/2022 1708   PHURINE 7.0 03/09/2022 1708   GLUCOSEU NEGATIVE 03/09/2022 1708   HGBUR TRACE (A) 03/09/2022 1708   BILIRUBINUR NEGATIVE 03/09/2022 1708   KETONESUR NEGATIVE 03/09/2022 1708   PROTEINUR NEGATIVE 03/09/2022 1708   NITRITE NEGATIVE 03/09/2022 1708   LEUKOCYTESUR LARGE (A) 03/09/2022 1708   Sepsis Labs: !!!!!!!!!!!!!!!!!!!!!!!!!!!!!!!!!!!!!!!!!!!! '@LABRCNTIP'$ (procalcitonin:4,lacticidven:4) )No results found for this or any previous visit (from the past 240 hour(s)).   Radiological Exams on Admission: CT HEAD CODE STROKE WO CONTRAST  Result Date: 04/29/2022 CLINICAL DATA:  Code stroke. Acute neuro deficit. Altered mental status, weakness EXAM: CT HEAD WITHOUT CONTRAST TECHNIQUE: Contiguous axial images were obtained from the base of the skull through the vertex without intravenous contrast. RADIATION DOSE REDUCTION: This exam was performed according to the departmental dose-optimization program which includes automated exposure control, adjustment of the mA and/or kV according to patient size and/or use of iterative reconstruction technique. COMPARISON:  CT head 03/09/2022 FINDINGS: Brain: Generalized atrophy and ventricular enlargement, stable. Ventricular  enlargement consistent with atrophy. Moderate white matter hypodensity bilaterally is symmetric and unchanged. Negative for acute infarct, hemorrhage, mass Vascular: Negative for hyperdense vessel. 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 Skull: Negative Sinuses/Orbits: Mild mucosal edema paranasal sinuses. Negative orbit Other: None ASPECTS ([place] Stroke Program Early CT Score) - Ganglionic level infarction (caudate, lentiform nuclei, internal capsule, insula, M1-M3 cortex): 7 -  Supraganglionic infarction (M4-M6 cortex): 3 Total score (0-10 with 10 being normal): 10 IMPRESSION: 1. No acute abnormality. Atrophy and chronic microvascular ischemia. 2. Aspects is 10. 3. Code stroke imaging results were communicated on 04/29/2022 at 10:59 am to provider [facility] via Amion app Electronically Signed   By: Franchot Gallo M.D.   On: 04/29/2022 10:59     All images have been reviewed by me personally.    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  No clear evidence of seizures.  Sodium level upon admission 117, repeat 114.  Initially concerns of stroke therefore neurology was consulted.  CT of the head was negative. -Order urine sodium, urine and serum osmolality.  Check TSH, B12, folate, ammonia -Continue neurochecks. -Hyponatremia appears to be multifactorial in nature including dehydration, medication induced/SIADH and alcohol use. Consulted  Dr Carolin Sicks fron Nephro.  At this time I do not see any acute need for hypertonic saline  Addendum 242pm: I spoke with nephrology, Dr. Carolin Sicks who recommends starting patient on hypertonic saline therefore I will discuss this case with ICU provider and likely transfer him there due to his current medication needs.  Hypocalcemia - IV calcium gluconate  History of seizure disorder - Recently his Keppra was adjusted during previous admission leading to significant agitation therefore outpatient neurology tried to switch him over to [place] but patient unable to afford it therefore Oxy carbamazepine was added and for now Keppra was continued.  During his hospitalization, patient seen by neurology and currently placed him on Lamictal and Keppra which we will continue.  I am holding his home medications as neurology continues to adjust  Alcohol abuse - Drinks daily gin and tonic.  We will place him on alcohol withdrawal protocol.  Multivitamin, folic acid and thiamine  GERD - PPI  History of CVA - On aspirin Plavix and statin  Peripheral neuropathy - Gabapentin 300 mg at bedtime   PT/OT  DVT prophylaxis: Subcu Lovenox Code Status: Full code Family Communication: Spoke with patient's caregiver at bedside Consults called: Seen by neurology Admission status: Progressive care unit admission due to severe hyponatremia  Status is: Inpatient Remains inpatient appropriate because: Admit to inpatient due to severe hyponatremia   Time Spent: 65 minutes.  >50% of the time was devoted to discussing the patients care, assessment, plan and disposition with other care givers along with counseling the patient about the risks and benefits of treatment.    Maryclare Nydam Arsenio Loader MD [facility]  If 7PM-7AM, please contact night-coverage   04/29/2022, 1:05 PM

## 2022-04-29 NOTE — Consult Note (Addendum)
NEUROLOGY CONSULTATION NOTE   Date of service: April 29, 2022 Patient Name: Carlos Wise MRN:  [id number] DOB:  1940/06/29 Reason for consult: "confusion, left sided weakness" Requesting Provider: No att. providers found _ _ _   _ __   _ __ _ _  __ __   _ __   __ _  History of Present Illness  Carlos Wise is a 81 y.o. male with PMH significant for hypertension, hyperlipidemia, prostate cancer s/p radiation, seizure disorder on keppra, chronic hyponatremia, alcohol abuse (reportedly drinks 8 oz of gin daily), tobacco use cut down to 0.5 pack/day smoker, right ICA aneurysm s/p coiling, and left corona radiata acute lacunar infarction on 9/16 who presents with about 2 days of generalized weakness.  He was evaluated in the triage and was activated as a code stroke for concern for left-sided weakness and confusion.  On my evaluation, patient knows he is in the hospital and is oriented to month and year but he reports he is not entirely sure why he is in the ED.  Reports his caretaker wanted him to be checked out.  When asked about falls, he does endorse falling twice yesterday.   I spoke to his caretaker Ms. [name] who reports that Travez was angry and cussing out over the last couple months and his Keppra was switched to [place] but it had really high co-pay of around $400 a month it was thus switched over to oxcarbazepine.  He took his first dose of oxcarbazepine on Friday night and around Monday they noted that he was more somnolent, more confused and weak all over.  They initially attributed this to his lipitor but continued to decline after stopping lipitor. This progressed significantly on Tuesday and he had a fall in the evening and also wet himself and also had a fall on Wednesday morning.  He is very shaky and weak when he tries to get up.  They have noticed that his p.o. intake is declined over the last day and a half.  There are worried that when they are changing him, he is not as  participatory.  He has also acted this way in the past in the setting of a UTI.  Carlos Wise continues to drink alcohol and drinks about 8 ounces of gin daily as he was doing when I saw him back in September.  He has cut down on his smoking to about half a pack a day from about 1 pack a day from back when I saw him.  LKW: 1900 on 04/28/22 mRS: 4 tNKASE: not offered, no to low suspicion for stroke Thrombectomy: not offered, no to low suspicion for stroke NIHSS components Score: Comment  1a Level of Conscious 0'[x]'$  1'[]'$  2'[]'$  3'[]'$      1b LOC Questions 0'[x]'$  1'[]'$  2'[]'$       1c LOC Commands 0'[x]'$  1'[]'$  2'[]'$       2 Best Gaze 0'[x]'$  1'[]'$  2'[]'$       3 Visual 0'[x]'$  1'[]'$  2'[]'$  3'[]'$      4 Facial Palsy 0'[x]'$  1'[]'$  2'[]'$  3'[]'$      5a Motor Arm - left 0'[x]'$  1'[]'$  2'[]'$  3'[]'$  4'[]'$  UN'[]'$    5b Motor Arm - Right 0'[x]'$  1'[]'$  2'[]'$  3'[]'$  4'[]'$  UN'[]'$    6a Motor Leg - Left 0'[x]'$  1'[]'$  2'[]'$  3'[]'$  4'[]'$  UN'[]'$    6b Motor Leg - Right 0'[x]'$  1'[]'$  2'[]'$  3'[]'$  4'[]'$  UN'[]'$    7 Limb Ataxia 0'[x]'$  1'[]'$  2'[]'$  3'[]'$  UN'[]'$     8 Sensory 0'[x]'$  1'[]'$  2'[]'$  UN'[]'$   9 Best Language 0'[x]'$  1'[]'$  2'[]'$  3'[]'$      10 Dysarthria 0'[x]'$  1'[]'$  2'[]'$  UN'[]'$      11 Extinct. and Inattention 0'[x]'$  1'[]'$  2'[]'$       TOTAL: 0      ROS   Unable to obtain detailed ROS 2/2 bradyphrenia.  Past History   Past Medical History:  Diagnosis Date   Herpes zoster    Hyperlipemia    Hypertension    Intestinal polyp    hyperplastic   Prostate cancer ([place])    S/P radiation therapy 12/12/2014 through 02/06/2015                                                      Prostate 7800 cGy in 40 sessions, seminal vesicles 5600 cGy in 40 sessions                           Seizure disorder [facility])    Past Surgical History:  Procedure Laterality Date   Carotid Artery Catherization     PROSTATE BIOPSY  06/27/2014   Family History  Problem Relation Age of Onset   Colon cancer Mother    Prostate cancer Father    Heart attack Father    Social History   Socioeconomic History   Marital status: Widowed    Spouse name: Not on file   Number of  children: 0   Years of education: Not on file   Highest education level: Not on file  Occupational History   Occupation: Retired  Tobacco Use   Smoking status: Former   Smokeless tobacco: Never   Tobacco comments:    Quit 30 years ago  Substance and Sexual Activity   Alcohol use: Yes    Alcohol/week: 0.0 standard drinks of alcohol    Comment: 3 oz daily   Drug use: No   Sexual activity: Not on file  Other Topics Concern   Not on file  Social History Narrative   Not on file   Social Determinants of Health   Financial Resource Strain: Not on file  Food Insecurity: No Food Insecurity (03/01/2022)   Hunger Vital Sign    Worried About Running Out of Food in the Last Year: Never true    Ran Out of Food in the Last Year: Never true  Transportation Needs: No Transportation Needs (03/01/2022)   PRAPARE - Hydrologist (Medical): No    Lack of Transportation (Non-Medical): No  Physical Activity: Not on file  Stress: Not on file  Social Connections: Not on file   Allergies  Allergen Reactions   Penicillins Other (See Comments)    Patient states no allergy, but has preference that he does not want this medication    Medications  (Not in a hospital admission)    Vitals   Vitals:   04/29/22 1013 04/29/22 1016  BP: 114/70   Pulse: 82   Resp: 15   Temp: 98.4 F (36.9 C)   TempSrc: Oral   SpO2: 95%   Weight:  79.4 kg  Height:  '5\' 8"'$  (1.727 m)     Body mass index is 26.61 kg/m.  Physical Exam   General: Laying comfortably in bed; in no acute distress.  HENT: Normal oropharynx and mucosa. Normal external appearance of ears and nose.  Neck: Supple, no pain or tenderness  CV: No JVD. No peripheral edema.  Pulmonary: Symmetric Chest rise. Normal respiratory effort.  Abdomen: Soft to touch, non-tender.  Ext: No cyanosis, edema, or deformity  Skin: No rash. Normal palpation of skin.   Musculoskeletal: Normal digits and nails by inspection. No  clubbing.   Neurologic Examination  Mental status/Cognition: Awake, slow to respond. oriented to self, place, month and year, poor attention.  Speech/language: Non fluent, bradyphrenic. Responds after a couple secs. comprehension intact, object naming intact, repetition intact.  Cranial nerves:   CN II Pupils equal and reactive to light, no VF deficits    CN III,IV,VI EOM intact, no gaze preference or deviation, no nystagmus    CN V normal sensation in V1, V2, and V3 segments bilaterally    CN VII no asymmetry, no nasolabial fold flattening    CN VIII normal hearing to speech    CN IX & X normal palatal elevation, no uvular deviation    CN XI 5/5 head turn and 5/5 shoulder shrug bilaterally    CN XII midline tongue protrusion    Motor:  Muscle bulk: normal, tone mildly increased throughout, pronator drift none tremor none Mvmt Root Nerve  Muscle Right Left Comments  SA C5/6 Ax Deltoid 5 5   EF C5/6 Mc Biceps 5 5   EE C6/7/8 Rad Triceps 5 5   WF C6/7 Med FCR     WE C7/8 PIN ECU     F Ab C8/T1 U ADM/FDI 5 5   HF L1/2/3 Fem Illopsoas 5 5   KE L2/3/4 Fem Quad 5 5   DF L4/5 D Peron Tib Ant 5 5   PF S1/2 Tibial Grc/Sol 5 5    Reflexes:  Right Left Comments  Pectoralis      Biceps (C5/6) 2 2   Brachioradialis (C5/6) 2 2    Triceps (C6/7) 2 2    Patellar (L3/4) 2 2    Achilles (S1)      Hoffman      Plantar     Jaw jerk    Sensation:  Light touch Intact throughout   Pin prick    Temperature    Vibration   Proprioception    Coordination/Complex Motor:  - Finger to Nose intact bilaterally - Heel to shin intact bilaterally - Rapid alternating movement are slowed - Gait: Deferred for patient's safety given he had 2 falls yesterday. Labs   CBC: No results for input(s): "WBC", "NEUTROABS", "HGB", "HCT", "MCV", "PLT" in the last 168 hours.  Basic Metabolic Panel:  Lab Results  Component Value Date   NA 132 (L) 03/24/2022   K 4.3 03/24/2022   CO2 26 03/24/2022   GLUCOSE  95 03/24/2022   BUN 20 03/24/2022   CREATININE 0.82 03/24/2022   CALCIUM 9.3 03/24/2022   GFRNONAA >60 03/24/2022   GFRAA >60 02/08/2020   Lipid Panel:  Lab Results  Component Value Date   LDLCALC 161 (H) 03/02/2022   HgbA1c:  Lab Results  Component Value Date   HGBA1C 5.1 03/02/2022   Urine Drug Screen:     Component Value Date/Time   LABOPIA NONE DETECTED 11/23/2021 0304   COCAINSCRNUR Negative 03/10/2022 1451   LABBENZ NONE DETECTED 11/23/2021 0304   AMPHETMU NONE DETECTED 11/23/2021 0304   THCU NONE DETECTED 11/23/2021 0304   LABBARB See Final Results 03/10/2022 1451   LABBARB NONE DETECTED 11/23/2021 0304    Alcohol Level     Component Value Date/Time  ETH <10 03/09/2022 1755    CT Head without contrast(Personally reviewed): CTH was negative for a large hypodensity concerning for a large territory infarct or hyperdensity concerning for an Joy  Impression   Carlos Wise is a 81 y.o. male with PMH significant for hypertension, hyperlipidemia, prostate cancer s/p radiation, seizure disorder on keppra, chronic hyponatremia, alcohol abuse (reportedly drinks 8 oz of gin daily), tobacco use cut down to 0.5 pack/day smoker, right ICA aneurysm s/p coiling, and left corona radiata acute lacunar infarction on 9/16 who presents with about 2 days of generalized weakness. This occurred a couple days after starting oxcarbazepine and has worsened to the point that he has 2 falls yesterday.   Labs with significant hyponatremia with sodium of 114 noted on iSTAT but full chemistry is pending.  Overall, presentation is highly concerning for oxcarbazepine intolerance and oxcarbazepine induced hyponatremia. Would absolutely stop oxcarbazepine.  Instead, will recommend gradual uptitration of Lamotrigine with Keppra as a bridge until he is therapuetic on lamotrigine and then stop Keppra.  Recommendations  - Management of hyponatremia per primary team. - Recommend infectious workup with  UA with reflex to Ucx, CXR, Procalcitonin and lactate. - recommend gradual uptitration of Lamotrigine with Keppra as a bridge until he is therapuetic on lamotrigine and then stop Keppra. Lamotrigine Morning Evening Keppra  Week 1 '25mg'$   '1000mg'$  BID  Week 2 '25mg'$  '25mg'$  '1000mg'$  BID  Week 3 '50mg'$  '25mg'$  '1000mg'$  BID  Week 4 '50mg'$  '50mg'$  '1000mg'$  BID  Week 5 '75mg'$  '50mg'$  '1000mg'$  BID  Week 6 '75mg'$  '75mg'$  '1000mg'$  BID  Week 7 '100mg'$  '75mg'$  Keppra '500mg'$  BID  Week 8 '100mg'$  '100mg'$  Stop Keppra.  - we will be available on an as needed basis. Please feel free to contact us with any questions or concerns. - low suspicion for stroke, stroke code cancelled. - I called his [facility] in [place], [place] and notified staff member([facility]) to cancel refills of Oxcarbazepine. ______________________________________________________________________  Plan discussed with Dr. Tomi Bamberger.  Thank you for the opportunity to take part in the care of this patient. If you have any further questions, please contact the neurology consultation attending.  Signed,  [place] Pager Number [phone] _ _ _   _ __   _ __ _ _  __ __   _ __   __ _

## 2022-04-29 NOTE — Plan of Care (Signed)
Seizure meds discussed with bedside nurse.  Patient is on gabapentin 300 nightly with normal renal function this is not a seizure medication dose, and defer to primary team  Patient is meant to be on a Keppra bridge to therapeutic Lamictal level, please see full consultation note from neurology earlier today for details.  As he is not currently tolerating p.o. we will cancel lamotrigine doses for now, continue Keppra 1000 mg twice daily IV.  Once he is able to start p.o. safely, please start lamotrigine with gradual increase as detailed in prior neurology note  Neurology will be available as needed, please reach out with any additional questions or concerns  Lesleigh Noe MD-PhD [facility] [phone] Available 7 PM to 7 AM, outside of these hours please call Neurologist on call as listed on Amion.

## 2022-04-29 NOTE — Progress Notes (Addendum)
[facility] Nephrology Consultation Note  Requesting MD: Dr. Reesa Chew, [place] Reason for consult: Hyponatremia.  HPI:  Carlos Wise is a 81 y.o. male with history of hypertension, prostate cancer s/p radiation, alcohol abuse, seizure disorder, acid reflux, stroke, chronic hyponatremia on salt tablet, admitted in 03/2022 for encephalopathy, failure to thrive and stroke presents today for change in mental status associated with generalized weakness for few days, seen as a consultation for the management of hyponatremia. The patient has caretaker who noticed that he is not acting himself and becoming more sluggish recently.  She reports that he used to drink alcohol heavily but recently around 2 drinks a day.  He takes salt tablets for hyponatremia and follows with PCP.  Apparently he is on Lexapro and recently started oxcarbazepine for seizure disorder.  He was a started on oxcarbazepine about 5 days by his neurologist for the management of seizure disorder. In the ER, blood pressure was acceptable, in room air.  The labs showed initial sodium level of 117 and the repeat lab showed 114.  He has normal GFR.  The baseline serum sodium level seems to be around 127-130.  CT head with chronic atrophy and microvascular ischemia. His caregiver was present at the bedside who provided most of the history.  In the ER he was getting NS 70 cc an hour. No history of nausea, vomiting, diarrhea.  Reportedly he has not been eating well and this is his chronic issues.  Not on diuretics. PMHx:   Past Medical History:  Diagnosis Date   Herpes zoster    Hyperlipemia    Hypertension    Intestinal polyp    hyperplastic   Prostate cancer ([place])    S/P radiation therapy 12/12/2014 through 02/06/2015                                                      Prostate 7800 cGy in 40 sessions, seminal vesicles 5600 cGy in 40 sessions                           Seizure disorder [facility])     Past Surgical History:   Procedure Laterality Date   Carotid Artery Catherization     PROSTATE BIOPSY  06/27/2014    Family Hx:  Family History  Problem Relation Age of Onset   Colon cancer Mother    Prostate cancer Father    Heart attack Father     Social History:  reports that he has quit smoking. He has never used smokeless tobacco. He reports current alcohol use. He reports that he does not use drugs.  Allergies:  Allergies  Allergen Reactions   Oxcarbazepine     Significant and severe hyponatremia noted within a few days of starting Oxcarbazepine.   Penicillins Other (See Comments)    Patient states no allergy, but has preference that he does not want this medication    Medications: Prior to Admission medications   Medication Sig Start Date End Date Taking? Authorizing Provider  aspirin EC 81 MG tablet Take 81 mg by mouth daily. Swallow whole.   Yes [provider]  atorvastatin (LIPITOR) 20 MG tablet Take 20 mg by mouth daily.   Yes [provider]  Cholecalciferol (VITAMIN D3 PO) Take 1 capsule by mouth daily.  Yes [provider]  clobetasol cream (TEMOVATE) 2.50 % Apply 1 Application topically daily as needed (itching scalp).   Yes [provider]  clopidogrel (PLAVIX) 75 MG tablet Take 1 tablet (75 mg total) by mouth daily. 04/09/22  Yes Camara, Maryan Puls, MD  Cyanocobalamin (VITAMIN B-12 PO) Take 1 tablet by mouth daily.   Yes [provider]  escitalopram (LEXAPRO) 10 MG tablet Take 10 mg by mouth daily.   Yes [provider]  fluticasone (FLONASE) 50 MCG/ACT nasal spray Place 1 spray into both nostrils daily as needed for allergies. 12/10/19  Yes [provider]  folic acid (FOLVITE) 1 MG tablet Take 1 mg by mouth daily.   Yes [provider]  gabapentin (NEURONTIN) 300 MG capsule Take 300 mg by mouth at bedtime.   Yes [provider]  ketotifen (ALLERGY EYE DROPS) 0.025 % ophthalmic solution Place 1 drop into  both eyes daily as needed (red eyes).   Yes [provider]  levETIRAcetam (KEPPRA) 500 MG tablet Take 1,000 mg by mouth 2 (two) times daily. 04/21/22  Yes [provider]  Naproxen Sod-diphenhydrAMINE (ALEVE PM) 220-25 MG TABS Take 2 tablets by mouth at bedtime.   Yes [provider]  pantoprazole (PROTONIX) 40 MG tablet Take 40 mg by mouth daily.   Yes [provider]  Pseudoeph-Doxylamine-DM-APAP (NYQUIL PO) Take 2 capsules by mouth at bedtime as needed (sleep when aleve pm is no working).   Yes [provider]  sodium chloride (OCEAN) 0.65 % SOLN nasal spray Place 1 spray into both nostrils daily as needed for congestion.   Yes [provider]  sodium chloride 1 g tablet Take 1 g by mouth 3 (three) times daily.   Yes [provider]  thiamine (VITAMIN B1) 100 MG tablet Take 250 mg by mouth daily. 02/16/22  Yes [provider]  triamcinolone cream (KENALOG) 0.1 % Apply 1 Application topically daily as needed (itching skin).   Yes [provider]  atorvastatin (LIPITOR) 80 MG tablet Take 1 tablet (80 mg total) by mouth daily. Patient not taking: Reported on 04/29/2022 03/25/22   Caren Griffins, MD    I have reviewed the patient's current medications.  Labs: Renal Panel: Recent Labs    11/22/21 1336 11/22/21 1359 02/28/22 1630 02/28/22 1701 03/09/22 1310 03/10/22 0451 03/11/22 0754 03/12/22 0313 03/13/22 0228 03/14/22 0370 03/15/22 0507 03/16/22 0054 03/17/22 0240 03/18/22 0300 03/19/22 0234 03/20/22 0210 03/21/22 0159 03/22/22 0300 03/23/22 0037 03/24/22 0025 04/29/22 1036 04/29/22 1059  NA 126*   < > 125*  125*   < > 125*   < > 125* 128* 127* 128*   < > 129* 131* 132* 128* 129* 130* 129* 131* 132* 117* 114*  K 4.3   < > 5.2*   < > 4.7   < > 3.8 3.9 3.9 4.1   < > 4.3 4.5 4.3 4.0 4.3 3.8 4.1 4.9 4.3 3.8 3.9  CL 91*   < > 90*   < > 90*   < > 95* 92* 91* 96*   < > 95* 96* 96* 94* 93* 94* 93* 93*  95* 81* 81*  CO2 22   < > 27   < > 25   < > 21* '25 25 23   '$ < > '26 26 26 25 25 25 24 27 26 '$ 21*  --   GLUCOSE 125*   < > 100*   < > 104*   < >  153* 125* 123* 111*   < > 120* 125* 115* 118* 93 128* 128* 94 95 103* 100*  BUN 12   < > 11   < > 9   < > '13 16 14 16   '$ < > '21 21 22 22 21 18 20 22 20 '$ 6* 6*  CREATININE 0.89   < > 0.80   < > 0.95   < > 0.76 0.69 0.64 0.62   < > 0.65 0.71 0.69 0.65 0.71 0.61 0.68 0.69 0.82 0.71 0.60*  CALCIUM 9.5   < > 9.3   < > 9.4   < > 8.7* 9.2 8.8* 9.1   < > 9.0 9.1 8.9 9.0 9.7 9.3 9.2 9.9 9.3 9.3  --   MG  --    < >  --    < >  --    < > 1.8 2.0 1.9 2.0  --   --   --  2.0 2.0 2.1 2.3 2.1 2.2 2.1  --   --   PHOS  --   --   --   --   --   --  2.6 2.4* 3.1 3.9  --   --   --   --   --   --   --   --   --   --   --   --   ALBUMIN 4.2  --  4.2  --  4.1  --   --   --   --   --   --   --   --   --   --   --   --   --   --   --  4.1  --    < > = values in this interval not displayed.     CBC:    Latest Ref Rng & Units 04/29/2022   10:59 AM 04/29/2022   10:36 AM 03/24/2022   12:25 AM  CBC  WBC 4.0 - 10.5 K/uL  10.0  9.3   Hemoglobin 13.0 - 17.0 g/dL 13.6  13.0  12.1   Hematocrit 39.0 - 52.0 % 40.0  34.7  34.7   Platelets 150 - 400 K/uL  252  314      Anemia Panel:  Recent Labs    11/23/21 0148 11/23/21 0628 03/22/22 0300 03/23/22 0037 03/24/22 0025 04/29/22 1036 04/29/22 1059  HGB  --    < > 12.2* 12.5* 12.1* 13.0 13.6  MCV  --    < > 90.5 92.1 93.0 85.7  --   VITAMINB12 919*  --   --   --   --   --   --    < > = values in this interval not displayed.    Recent Labs  Lab 04/29/22 1036  AST 41  ALT 16  ALKPHOS 63  BILITOT 1.2  PROT 6.8  ALBUMIN 4.1    Lab Results  Component Value Date   HGBA1C 5.1 03/02/2022    ROS:  Pertinent items noted in HPI and remainder of comprehensive ROS otherwise negative.  Physical Exam: Vitals:   04/29/22 1245 04/29/22 1300  BP: (!) 151/130 (!) 161/85  Pulse: 78 79  Resp: 14 16  Temp:    SpO2: 98% 97%      General exam: Appears calm and comfortable  Respiratory system: Clear to auscultation. Respiratory effort normal. No wheezing or crackle Cardiovascular system: S1 & S2 heard, RRR.  No pedal edema. Gastrointestinal system:  Abdomen is nondistended, soft and nontender. Normal bowel sounds heard. Central nervous system: Alert awake and following commands.  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  He presented with change in mental status and generalized weakness.  On salt tablet at home for chronic hyponatremia. Check UA, urine sodium, urine osmolality, serum osmolality, TSH, uric acid level. I will start hypertonic saline at 50 cc an hour.  DC NS fluid. Check labs closely, neuro monitoring.  Expect serum sodium level around 120 by tomorrow morning.  Avoid rapid correction.  Recommend to discontinue Lexapro and oxcarbazepine.  Discussed with the primary team.  #Acute metabolic encephalopathy due to electrolytes abnormalities mainly hyponatremia: Management as above.  CT scan with brain atrophy and previous stroke.  #Hypocalcemia: Replating calcium supplement.  #History of seizure disorder: Per neurology team.  Recommend to avoid carbamazepine, Lexapro.  Discussed with the primary team. Thank you for the consult.  We will continue to follow.   Carlos Wise Tanna Furry 04/29/2022, 2:26 PM  [facility].

## 2022-04-29 NOTE — Progress Notes (Signed)
This RN attempted to give patient oral medications, as ED RN stated the patient did well with PO's in ED. During the attempt, the patient was unable to swallow the pill on the first time, coughed after each sip of water, and held the liquid in his mouth for a prolonged amount of time before swallowing. MD/NP notified. Patient made NPO.

## 2022-04-29 NOTE — Progress Notes (Signed)
[place] [place] Progress Note Patient Name: Carlos Wise DOB: [birth year] MRN: [id number]   Date of Service  04/29/2022  HPI/Events of Note  Hyponatremia  - Na+ = 114 at 11 AM --> 119 at 4:30 PM. Patient was on 3% NaCl at 50 mL/hour. I do not see that the 3% NaCl infusion rate was adjusted d/t 4:30 PM Na+ result.   eICU Interventions  Plan: Decrease 3% NaCl to 25 mL/hour. Continue to trend Na+ Q 4 hours.      Intervention Category Major Interventions: Electrolyte abnormality - evaluation and management  [name] 04/29/2022, 7:30 PM

## 2022-04-29 NOTE — Progress Notes (Signed)
eLink Physician-Brief Progress Note Patient Name: Carlos Wise DOB: October 03, 1940 MRN: [id number]   Date of Service  04/29/2022  HPI/Events of Note  Hyponatremia = Na+ = 119 --> 118 --> 121. Patient NPO and is on several oral anticonvulsant medications (Gabapentin and Lamictal) which he can't get.   eICU Interventions  Plan: Decrease 3% NaCl to 10 mL/hour. Defer decision on anticonvulsants to Neurology. Hold vs place NGT for continued anticonvulsant Rx.      Intervention Category Major Interventions: Electrolyte abnormality - evaluation and management  Carlos Wise 04/29/2022, 10:39 PM

## 2022-04-29 NOTE — ED Provider Triage Note (Signed)
Emergency Medicine Provider Triage Evaluation Note  SUEDE GREENAWALT , a 81 y.o. male  was evaluated in triage.  Pt complains of acute confusion.  Patient is limited in his ability to provide information.  Per EMS patient was last known well yesterday, he had a seizure and has been confused since then.  Neighbor called EMS and brought patient to the hospital..  Review of Systems  Per HPI  Physical Exam  BP 114/70 (BP Location: Right Arm)   Pulse 82   Temp 98.4 F (36.9 C) (Oral)   Resp 15   Ht '5\' 8"'$  (1.727 m)   Wt 79.4 kg   SpO2 95%   BMI 26.61 kg/m  Gen:   Awake, no distress   Resp:  Normal effort  MSK:   Moves extremities without difficulty  Other:  Patient is oriented to self and place and year.  Unsure about month or date or president.  Slowed speech, not dysarthric.  Patient has left upper extremity weakness on exam, left-sided pronator drift.  Ataxic left finger-nose.  Lower extremity strength is symmetric.   Medical Decision Making  Medically screening exam initiated at 10:30 AM.  Appropriate orders placed.  SYDNEY AZURE was informed that the remainder of the evaluation will be completed by another provider, this initial triage assessment does not replace that evaluation, and the importance of remaining in the ED until their evaluation is complete.  Unclear last known well but presumably last night per EMS.  Status epilepticus versus TIA/CVA verus encephalopathy.  Discussed with Dr. Francia Greaves will activate as a code stroke given LVO positive.    Sherrill Raring, PA-C 04/29/22 1032

## 2022-04-29 NOTE — Telephone Encounter (Signed)
Pt's caregiver called stating that the pt lost his balance and fell last night and this morning. Pt has been very week and she would like to discuss his seizure medication and also discuss if she should take him to the ER. Please advise.

## 2022-04-30 ENCOUNTER — Inpatient Hospital Stay (HOSPITAL_COMMUNITY): Payer: Medicare HMO

## 2022-04-30 ENCOUNTER — Other Ambulatory Visit: Payer: Self-pay | Admitting: Neurology

## 2022-04-30 DIAGNOSIS — E871 Hypo-osmolality and hyponatremia: Secondary | ICD-10-CM | POA: Diagnosis not present

## 2022-04-30 LAB — BASIC METABOLIC PANEL
Anion gap: 14 (ref 5–15)
Anion gap: 10 (ref 5–15)
Anion gap: 10 (ref 5–15)
BUN: 5 mg/dL — ABNORMAL LOW (ref 8–23)
BUN: 5 mg/dL — ABNORMAL LOW (ref 8–23)
BUN: 6 mg/dL — ABNORMAL LOW (ref 8–23)
CO2: 20 mmol/L — ABNORMAL LOW (ref 22–32)
CO2: 21 mmol/L — ABNORMAL LOW (ref 22–32)
CO2: 21 mmol/L — ABNORMAL LOW (ref 22–32)
Calcium: 9.3 mg/dL (ref 8.9–10.3)
Calcium: 8.8 mg/dL — ABNORMAL LOW (ref 8.9–10.3)
Calcium: 9 mg/dL (ref 8.9–10.3)
Chloride: 86 mmol/L — ABNORMAL LOW (ref 98–111)
Chloride: 86 mmol/L — ABNORMAL LOW (ref 98–111)
Chloride: 89 mmol/L — ABNORMAL LOW (ref 98–111)
Creatinine, Ser: 0.57 mg/dL — ABNORMAL LOW (ref 0.61–1.24)
Creatinine, Ser: 0.63 mg/dL (ref 0.61–1.24)
Creatinine, Ser: 0.68 mg/dL (ref 0.61–1.24)
GFR, Estimated: >60 mL/min (ref >60)
GFR, Estimated: >60 mL/min (ref >60)
GFR, Estimated: >60 mL/min (ref >60)
Glucose, Bld: 110 mg/dL — ABNORMAL HIGH (ref 70–99)
Glucose, Bld: 104 mg/dL — ABNORMAL HIGH (ref 70–99)
Glucose, Bld: 135 mg/dL — ABNORMAL HIGH (ref 70–99)
Potassium: 3.8 mmol/L (ref 3.5–5.1)
Potassium: 3.8 mmol/L (ref 3.5–5.1)
Potassium: 3.7 mmol/L (ref 3.5–5.1)
Sodium: 120 mmol/L — ABNORMAL LOW (ref 135–145)
Sodium: 117 mmol/L — CL (ref 135–145)
Sodium: 120 mmol/L — ABNORMAL LOW (ref 135–145)

## 2022-04-30 LAB — CBC
Hemoglobin: 12.1 g/dL — ABNORMAL LOW (ref 13.0–17.0)
Platelets: 243 10*3/uL (ref 150–400)
WBC: 10.2 10*3/uL (ref 4.0–10.5)

## 2022-04-30 LAB — SODIUM
Sodium: 120 mmol/L — ABNORMAL LOW (ref 135–145)
Sodium: 122 mmol/L — ABNORMAL LOW (ref 135–145)
Sodium: 121 mmol/L — ABNORMAL LOW (ref 135–145)
Sodium: 122 mmol/L — ABNORMAL LOW (ref 135–145)

## 2022-04-30 LAB — MAGNESIUM: Magnesium: 1.7 mg/dL (ref 1.7–2.4)

## 2022-04-30 LAB — OSMOLALITY, URINE: Osmolality, Ur: 332 mOsm/kg (ref 300–900)

## 2022-04-30 LAB — FOLATE: Folate: 37.8 ng/mL (ref >5.9)

## 2022-04-30 LAB — OSMOLALITY
Osmolality: 242 mOsm/kg — CL (ref 275–295)
Osmolality: 246 mOsm/kg — CL (ref 275–295)

## 2022-04-30 MED ORDER — LAMOTRIGINE 100 MG PO TABS: 100.0000 mg | ORAL_TABLET | Freq: Two times a day (BID) | ORAL | Status: DC

## 2022-04-30 MED ORDER — LAMOTRIGINE 25 MG PO TABS: 25.0000 mg | ORAL_TABLET | Freq: Every evening | ORAL | Status: DC

## 2022-04-30 MED ORDER — LAMOTRIGINE 25 MG PO TABS: 50.0000 mg | ORAL_TABLET | Freq: Every morning | ORAL | Status: DC

## 2022-04-30 MED ORDER — LAMOTRIGINE 25 MG PO TABS: 50.0000 mg | ORAL_TABLET | Freq: Every evening | ORAL | Status: DC

## 2022-04-30 MED ORDER — LAMOTRIGINE 25 MG PO TABS: 25.0000 mg | ORAL_TABLET | Freq: Two times a day (BID) | ORAL | Status: DC

## 2022-04-30 MED ORDER — LAMOTRIGINE 25 MG PO TABS: 75.0000 mg | ORAL_TABLET | Freq: Every day | ORAL | Status: DC

## 2022-04-30 MED ORDER — LAMOTRIGINE 25 MG PO TABS
25.0000 mg | ORAL_TABLET | Freq: Every day | ORAL | Status: DC
  Administered 2022-04-30 – 2022-05-03 (×4): 25 mg via ORAL
  Filled 2022-04-30 (×4): qty 1

## 2022-04-30 MED ORDER — KETOROLAC TROMETHAMINE 30 MG/ML IJ SOLN
15.0000 mg | Freq: Once | INTRAMUSCULAR | Status: AC
  Administered 2022-04-30: 15 mg via INTRAVENOUS
  Filled 2022-04-30: qty 1

## 2022-04-30 MED ORDER — LAMOTRIGINE 25 MG PO TABS: 75.0000 mg | ORAL_TABLET | Freq: Two times a day (BID) | ORAL | Status: DC

## 2022-04-30 MED ORDER — GABAPENTIN 100 MG PO CAPS
300.0000 mg | ORAL_CAPSULE | Freq: Once | ORAL | Status: AC
  Administered 2022-04-30: 300 mg via ORAL

## 2022-04-30 MED ORDER — LEVETIRACETAM 500 MG PO TABS
1000.0000 mg | ORAL_TABLET | Freq: Two times a day (BID) | ORAL | Status: DC
  Administered 2022-04-30 – 2022-05-03 (×6): 1000 mg via ORAL
  Filled 2022-04-30 (×7): qty 2

## 2022-04-30 MED ORDER — LAMOTRIGINE 100 MG PO TABS: 100.0000 mg | ORAL_TABLET | Freq: Every morning | ORAL | Status: DC

## 2022-04-30 MED ORDER — LEVETIRACETAM 500 MG PO TABS: 500.0000 mg | ORAL_TABLET | Freq: Two times a day (BID) | ORAL | Status: DC

## 2022-04-30 MED ORDER — LAMOTRIGINE 25 MG PO TABS: 50.0000 mg | ORAL_TABLET | Freq: Two times a day (BID) | ORAL | Status: DC

## 2022-04-30 MED ORDER — LAMOTRIGINE 25 MG PO TABS: 75.0000 mg | ORAL_TABLET | Freq: Every evening | ORAL | Status: DC

## 2022-04-30 NOTE — Evaluation (Signed)
Occupational Therapy Evaluation Patient Details Name: Carlos Wise MRN: [id number] DOB: 20-Jul-1940 Today's Date: 04/30/2022   History of Present Illness 81 yo male presents to [facility] on 11/15 with weakness, fall x2, AMS. Workup for hyponatremia due to possible medication intolerance, metabolic encephalopathy. 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   Clinical Impression   Pt typically ambulates with RW in home (limited) has been working with [facility] and has been progressing well. He gets assist from caregiver and/or RN for bathing and dressing daily. Today he is mod +2 for bed mobility, mod A +2 to come to feet then min A +2 for pivot to recliner. Pt demonstrating strong posterior lean initially in both sitting and standing, but improves with time. He is max A for LB ADL, min A for grooming, NPO, and mod A for UB bathing at this time. L=R for strength and ROM - generalized weakness. Pt presents with decreased safety awareness, balance, high fall risk. He will benefit from skilled OT in the acute setting prior to dc home with HHOT to maximize safety and independence in ADL and functional transfers.       Recommendations for follow up therapy are one component of a multi-disciplinary discharge planning process, led by the attending physician.  Recommendations may be updated based on patient status, additional functional criteria and insurance authorization.   Follow Up Recommendations  Home health OT     Assistance Recommended at Discharge Frequent or constant Supervision/Assistance  Patient can return home with the following A lot of help with walking and/or transfers;A lot of help with bathing/dressing/bathroom;Assistance with cooking/housework;Direct supervision/assist for  medications management;Direct supervision/assist for financial management;Assist for transportation;Help with stairs or ramp for entrance    Functional Status Assessment  Patient has had a recent decline in their functional status and demonstrates the ability to make significant improvements in function in a reasonable and predictable amount of time.  Equipment Recommendations  None recommended by OT (Pt has appropriate DME)    Recommendations for Other Services PT consult     Precautions / Restrictions Precautions Precautions: Fall Precaution Comments: hx of neuropathy Restrictions Weight Bearing Restrictions: No      Mobility Bed Mobility Overal bed mobility: Needs Assistance Bed Mobility: Supine to Sit     Supine to sit: Mod assist, +2 for physical assistance     General bed mobility comments: assist for BLE initiation to EOB, trunk elevation    Transfers Overall transfer level: Needs assistance Equipment used: Rolling walker (2 wheels) Transfers: Sit to/from Stand, Bed to chair/wheelchair/BSC Sit to Stand: Mod assist, +2 physical assistance, +2 safety/equipment     Step pivot transfers: Min assist, +2 physical assistance, +2 safety/equipment     General transfer comment: initial strong posterior lean, hx of neuropathy, poor foot placement during transfer and assist to manuever wide walker.      Balance Overall balance assessment: Needs assistance Sitting-balance support: Bilateral upper extremity supported, Feet unsupported Sitting balance-Leahy Scale: Poor Sitting balance - Comments: initial strong posterior lean, trouble scooting towards EOB, once feet on ground, improved sitting balance from mod A to min guard Postural control: Posterior lean Standing balance support: Bilateral upper extremity supported, Reliant on assistive device for balance Standing balance-Leahy Scale: Poor  ADL either performed or assessed with  clinical judgement   ADL Overall ADL's : Needs assistance/impaired Eating/Feeding: NPO   Grooming: Minimal assistance;Wash/dry face;Sitting Grooming Details (indicate cue type and reason): EOB Upper Body Bathing: Moderate assistance   Lower Body Bathing: Moderate assistance   Upper Body Dressing : Minimal assistance Upper Body Dressing Details (indicate cue type and reason): donning new gown Lower Body Dressing: Maximal assistance Lower Body Dressing Details (indicate cue type and reason): socks Toilet Transfer: Moderate assistance;+2 for physical assistance;+2 for safety/equipment;Stand-pivot;Rolling walker (2 wheels)   Toileting- Clothing Manipulation and Hygiene: Moderate assistance;Sit to/from stand       Functional mobility during ADLs: Moderate assistance;+2 for physical assistance;+2 for safety/equipment;Rolling walker (2 wheels) General ADL Comments: decreased balance, activity tolerance, safety awareness     Vision         Perception     Praxis      Pertinent Vitals/Pain Pain Assessment Pain Assessment: Faces Faces Pain Scale: Hurts even more Pain Location: cramping in BLE Pain Descriptors / Indicators: Cramping, Grimacing Pain Intervention(s): Limited activity within patient's tolerance, Monitored during session, Repositioned (warm blankets)     Hand Dominance     Extremity/Trunk Assessment Upper Extremity Assessment Upper Extremity Assessment: Generalized weakness (no noticeable difference between L and R grasp, utilizing LUE more functionally than R - but R arm is where all IV's are)   Lower Extremity Assessment Lower Extremity Assessment: Defer to PT evaluation (hx of neuropathy)   Cervical / Trunk Assessment Cervical / Trunk Assessment: Kyphotic   Communication Communication Communication: HOH   Cognition Arousal/Alertness: Awake/alert Behavior During Therapy: WFL for tasks assessed/performed Overall Cognitive Status: No family/caregiver  present to determine baseline cognitive functioning Area of Impairment: Safety/judgement, Following commands, Problem solving                       Following Commands: Follows one step commands with increased time Safety/Judgement: Decreased awareness of safety, Decreased awareness of deficits   Problem Solving: Slow processing, Decreased initiation, Requires verbal cues General Comments: Pt overall coopertive with therapists, sarcastic personality, increased time for command following - Pt is [facility]     General Comments       Exercises     Shoulder Instructions      Home Living Family/patient expects to be discharged to:: Private residence Living Arrangements: Other (Comment) (caretaker) Available Help at Discharge: Personal care attendant;Available 24 hours/day Type of Home: House Home Access: Ramped entrance Entrance Stairs-Number of Steps: 1   Home Layout: Multi-level     Bathroom Shower/Tub: Occupational psychologist: Standard Bathroom Accessibility: Yes How Accessible: Accessible via walker Home Equipment: [place] (2 wheels);Shower seat;Grab bars - toilet;Grab bars - tub/shower;Hand held shower head   Additional Comments: called [facility]      Prior Functioning/Environment Prior Level of Function : Needs assist             Mobility Comments: Pt using RW, getting PT 1x/wk ADLs Comments: has caregivers but is able to participate with ADLs        OT Problem List: Impaired balance (sitting and/or standing);Decreased safety awareness;Pain      OT Treatment/Interventions: Self-care/ADL training;DME and/or AE instruction;Therapeutic activities;Patient/family education;Balance training    OT Goals(Current goals can be found in the care plan section) Acute Rehab OT Goals Patient Stated Goal: get out of the hospital OT Goal Formulation: With patient Time For Goal Achievement: 05/14/22 Potential to Achieve Goals: Good ADL Goals Pt  Will Perform Grooming: with modified independence;sitting Pt Will Perform Upper Body Dressing: with modified independence;sitting Pt Will Perform Lower Body Dressing: with min assist;sit to/from stand Pt Will Transfer to Toilet: with min guard assist;ambulating Pt Will Perform Toileting - Clothing Manipulation and hygiene: with min guard assist;sit to/from stand Additional ADL Goal #1: Pt will verbalize 3 ways of decreasing risk for falls in the home environment with no cues  OT Frequency: Min 2X/week    Co-evaluation PT/OT/SLP Co-Evaluation/Treatment: Yes Reason for Co-Treatment: Necessary to address cognition/behavior during functional activity;For patient/therapist safety;To address functional/ADL transfers PT goals addressed during session: Mobility/safety with mobility;Balance OT goals addressed during session: ADL's and self-care;Strengthening/ROM;Proper use of Adaptive equipment and DME      AM-PAC OT "6 Clicks" Daily Activity     Outcome Measure Help from another person eating meals?: Total (NPO) Help from another person taking care of personal grooming?: A Little Help from another person toileting, which includes using toliet, bedpan, or urinal?: A Lot Help from another person bathing (including washing, rinsing, drying)?: A Lot Help from another person to put on and taking off regular upper body clothing?: A Little Help from another person to put on and taking off regular lower body clothing?: A Lot 6 Click Score: 13   End of Session Equipment Utilized During Treatment: Gait belt;Rolling walker (2 wheels) Nurse Communication: Mobility status;Precautions (Pt experiencing cramps in his legs)  Activity Tolerance: Patient tolerated treatment well Patient left: in chair;with call bell/phone within reach;with chair alarm set  OT Visit Diagnosis: Unsteadiness on feet (R26.81);Muscle weakness (generalized) (M62.81);History of falling (Z91.81);Other symptoms and signs involving  cognitive function;Pain Pain - Right/Left: Left (Bilateral) Pain - part of body: Leg                Time: 7530-0511 OT Time Calculation (min): 35 min Charges:  OT General Charges $OT Visit: 1 Visit OT Evaluation $OT Eval Moderate Complexity: [place] OTR/L Acute Rehabilitation Services Office: [place] 04/30/2022, 10:27 AM

## 2022-04-30 NOTE — Evaluation (Signed)
Clinical/Bedside Swallow Evaluation Patient Details  Name: Carlos Wise MRN: [id number] Date of Birth: Jun 11, 1941  Today's Date: 04/30/2022 Time: SLP Start Time (ACUTE ONLY): 1033 SLP Stop Time (ACUTE ONLY): 1105 SLP Time Calculation (min) (ACUTE ONLY): 32 min  Past Medical History:  Past Medical History:  Diagnosis Date   Herpes zoster    Hyperlipemia    Hypertension    Intestinal polyp    hyperplastic   Prostate cancer ([facility] [place])    S/P radiation therapy 12/12/2014 through 02/06/2015                                                      Prostate 7800 cGy in 40 sessions, seminal vesicles 5600 cGy in 40 sessions                           Seizure disorder [facility])    Past Surgical History:  Past Surgical History:  Procedure Laterality Date   Carotid Artery Catherization     PROSTATE BIOPSY  06/27/2014   HPI:  Carlos Wise is a 81 y.o. male who presented to the hospital for evaluation of generalized weakness and change in mental status. CT 11/15 with no acute findings.  Patient was recently admitted here at [facility] and discharged on October 11 after being treated for encephalopathy, acute failure to thrive and stroke.  MBS during this admission 10/3 revealing a "mild, primarily oral dysphagia" with recommendations for D3/Thin. Pt with with medical history significant of alcohol abuse, adult failure to thrive, history of seizure, GERD, recent CVA/concussion.    Assessment / Plan / Recommendation  Clinical Impression  Pt presents with a mild-moderate oral dysphagia and clinical indicators of pharyngeal dysphagia.  Pt rquesting water on SLP arrival.  Pt initially tolerated straw sips.  Over course of assessment pt with increasing length of oral holding, sometimes for minutes.  Pt states that he doesn't always swallow everthing at once.  Pt required consistent verbal cues to swallow.  Pt with intermittent cough after swallow with thin liquid.  With nectar thick liquid there was one instance of  cough with straw presentation.  Pt exhibited better tolerance with cup sips (for unknown reasons given prolonged oral holding) but there still was one instance of wet vocal quality after swallow when pt expressed his frustration and being given repeated verbal cues to swallow.  Pt's symptoms seem to be largely related to cognitition.  Pain in knee from neuropathy may also have been distracting for pt.  Gabapentin and other oral meds given during this evaluation.  Meds given whole with nectar thick liquid.  There were no clinical s/s of aspiration noted during these trials, but oral holding of liquid wash and meds persisted.  Hopefully, with pain managed, pt will be more attentive to POs.  Pt with relatively prompt oral phase with regular texture solids.  Pt stated he liked item he was eating.  There was diffuse, mild oral residue.  Pt tolerated puree without difficulty, but expressed distasted for applesauce.   Recommend regular texture diet with nectar thick liquid by cup sips.  Pt will need supervision with PO intake and verbal cues to swallow when oral holding is noted. Diet signed placed at [facility].  Pt seemingly confused today, consider cognitive-linguistic assessment if pt is not at/does  not return to baseline level of function.   SLP Visit Diagnosis: Dysphagia, oropharyngeal phase (R13.12)    Aspiration Risk  Mild aspiration risk    Diet Recommendation Regular;Nectar-thick liquid   Liquid Administration via: Cup;No straw Medication Administration: Whole meds with liquid Supervision: Full supervision/cueing for compensatory strategies Compensations: Slow rate;Small sips/bites (Verbal cues to swallow) Postural Changes: Seated upright at 90 degrees    Other  Recommendations Oral Care Recommendations: Oral care BID Other Recommendations: Order thickener from pharmacy    Recommendations for follow up therapy are one component of a multi-disciplinary discharge planning process, led by the  attending physician.  Recommendations may be updated based on patient status, additional functional criteria and insurance authorization.  Follow up Recommendations  (TBD)      Assistance Recommended at Discharge    Functional Status Assessment Patient has had a recent decline in their functional status and demonstrates the ability to make significant improvements in function in a reasonable and predictable amount of time.  Frequency and Duration min 2x/week  2 weeks       Prognosis Prognosis for Safe Diet Advancement: Good      Swallow Study   General Date of Onset: 04/29/22 HPI: Carlos Carlos Wise is a 81 y.o. male who presented to the hospital for evaluation of generalized weakness and change in mental status. CT 11/15 with no acute findings.  Patient was recently admitted here at [facility] and discharged on October 11 after being treated for encephalopathy, acute failure to thrive and stroke.  MBS during this admission 10/3 revealing a "mild, primarily oral dysphagia" with recommendations for D3/Thin. Pt with with medical history significant of alcohol abuse, adult failure to thrive, history of seizure, GERD, recent CVA/concussion. Type of Study: Bedside Swallow Evaluation Previous Swallow Assessment: MBS 03/17/22 Diet Prior to this Study: NPO Temperature Spikes Noted: No History of Recent Intubation: No Behavior/Cognition: Alert;Requires cueing Oral Cavity Assessment: Within Functional Limits Oral Care Completed by SLP: No Oral Cavity - Dentition: Adequate natural dentition Vision: Functional for self-feeding Self-Feeding Abilities: Able to feed self Patient Positioning: Upright in chair Baseline Vocal Quality: Normal Volitional Cough: Strong Volitional Swallow: Able to elicit    Oral/Motor/Sensory Function Overall Oral Motor/Sensory Function: Within functional limits Facial ROM: Within Functional Limits Facial Symmetry: Within Functional Limits Lingual ROM: Within Functional  Limits Lingual Symmetry: Within Functional Limits Lingual Strength: Within Functional Limits Velum: Within Functional Limits Mandible: Within Functional Limits   Ice Chips Ice chips: Not tested   Thin Liquid Thin Liquid: Impaired Presentation: Straw Oral Phase Functional Implications: Oral holding Pharyngeal  Phase Impairments: Cough - Immediate    Nectar Thick Nectar Thick Liquid: Impaired Presentation: Straw;Cup Oral phase functional implications: Oral holding Pharyngeal Phase Impairments: Wet Vocal Quality;Cough - Immediate   Honey Thick Honey Thick Liquid: Not tested   Puree Puree: Within functional limits Presentation: Spoon   Solid     Solid: Impaired Presentation: Self Fed Oral Phase Functional Implications: Oral residue (mild, diffuse)      Celedonio Savage, MA, [place] Office: [phone] 04/30/2022,11:25 AM

## 2022-04-30 NOTE — Progress Notes (Signed)
[facility] NEPHROLOGY PROGRESS NOTE  Assessment/ Plan: Pt is a 81 y.o. 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  He presented with change in mental status and generalized weakness.  On salt tablet at home for chronic hyponatremia. Urine sodium 110, osmolality 332, low uric acid level, TSH level acceptable. Started hypotonic saline on 11/15 with initial improvement of serum sodium level 120.  The rate of fluid was lowered overnight leading to sodium level down 117.  I will increase hypertonic saline rate to 50 cc an hour and continue to check/monitor lab.  Goal sodium around 125-126 by tomorrow AM.  Avoid rapid correction.  Recommend to discontinue Lexapro and oxcarbazepine.     #Acute metabolic encephalopathy due to electrolytes abnormalities mainly hyponatremia: Management as above.  CT scan with brain atrophy and previous stroke.   #Hypocalcemia: Replating calcium supplement.   #History of seizure disorder: Per neurology team.  Recommend to avoid carbamazepine, Lexapro.   Subjective: Seen and examined in ICU.  Patient was alert awake but is still somewhat confused.  Event noted overnight for lowering fluid rate and lab results.  Discussed with ICU nurse. Objective Vital signs in last 24 hours: Vitals:   04/30/22 0500 04/30/22 0600 04/30/22 0602 04/30/22 0736  BP: (!) 156/84 (!) 190/123 (!) 165/80   Pulse: 77 81 80   Resp: (!) 21 (!) 21    Temp:    97.6 F (36.4 C)  TempSrc:    Oral  SpO2: 96% (!) 88%     Weight: 71.5 kg     Height:       Weight change:   Intake/Output Summary (Last 24 hours) at 04/30/2022 0929 Last data filed at 04/30/2022 0600 Gross per 24 hour  Intake 560.54 ml  Output 1425 ml  Net -864.46 ml       Labs: RENAL PANEL Recent Labs    11/22/21 1336 11/22/21 1359 02/28/22 1630 02/28/22 1701 03/09/22 1310 03/10/22 0451 03/11/22 0754 03/12/22 0313 03/13/22 0228 03/14/22 4403 03/15/22 0507 03/18/22 0300 03/19/22 0234 03/20/22 0210 03/21/22 0159 03/22/22 0300 03/23/22 0037 03/24/22 0025 04/29/22 1036 04/29/22 1059 04/29/22 1630 04/29/22 1846 04/29/22 2037 04/29/22 2248 04/30/22 0030 04/30/22 0342 04/30/22 0702  NA 126*   < > 125*  125*   < > 125*   < > 125* 128* 127* 128*   < > 132* 128* 129* 130* 129* 131* 132* 117* 114* 119* 118* 121* 119* 120* 120* 117*  K 4.3   < > 5.2*   < > 4.7   < > 3.8 3.9 3.9 4.1   < > 4.3 4.0 4.3 3.8 4.1 4.9 4.3 3.8 3.9 4.0 4.0  --  3.9  --  3.8 3.8  CL 91*   < > 90*   < > 90*   < > 95* 92* 91* 96*   < > 96* 94* 93* 94* 93* 93* 95* 81* 81* 85* 86*  --  87*  --  86* 86*  CO2 22   < > 27   < > 25   < > 21* 25  25 23   < > '26 25 25 25 24 27 26 '$ 21*  --  20* 21*  --  20*  --  20* 21*  GLUCOSE 125*   < > 100*   < > 104*   < > 153* 125* 123* 111*   < > 115* 118* 93 128* 128* 94 95 103* 100* 117* 122*  --  101*  --  110* 104*  BUN 12   < > 11   < > 9   < > '13 16 14 16   '$ < > '22 22 21 18 20 22 20 '$ 6* 6* 6* 6*  --  6*  --  5* 5*  CREATININE 0.89   < > 0.80   < > 0.95   < > 0.76 0.69 0.64 0.62   < > 0.69 0.65 0.71 0.61 0.68 0.69 0.82 0.71 0.60* 0.63 0.68  --  0.68  --  0.57* 0.63  CALCIUM 9.5   < > 9.3   < > 9.4   < > 8.7* 9.2 8.8* 9.1   < > 8.9 9.0 9.7 9.3 9.2 9.9 9.3 9.3  --  9.0 9.4  --  9.1  --  9.3 8.8*  MG  --    < >  --    < >  --    < > 1.8 2.0 1.9 2.0  --  2.0 2.0 2.1 2.3 2.1 2.2 2.1  --   --   --   --   --   --   --  1.7  --   PHOS  --   --   --   --   --   --  2.6 2.4* 3.1 3.9  --   --   --   --   --   --   --   --   --   --    --   --   --   --   --   --   --   ALBUMIN 4.2  --  4.2  --  4.1  --   --   --   --   --   --   --   --   --   --   --   --   --  4.1  --   --   --   --   --   --   --   --    < > = values in this interval not displayed.     Liver Function Tests: Recent Labs  Lab 04/29/22 1036  AST 41  ALT 16  ALKPHOS 63  BILITOT 1.2  PROT 6.8  ALBUMIN 4.1   No results for input(s): "LIPASE", "AMYLASE" in the last 168 hours. Recent Labs  Lab 04/29/22 1530  AMMONIA 31   CBC: Recent Labs    11/23/21 0148 11/23/21 0628 03/24/22 0025 04/29/22 1036 04/29/22 1059 04/29/22 1550 04/30/22 0342  HGB  --    < > 12.1* 13.0 13.6 12.5* 12.1*  MCV  --    < > 93.0 85.7  --  87.1 RESULTS UNAVAILABLE DUE TO INTERFERING SUBSTANCE  VITAMINB12 919*  --   --   --   --  827  --   FOLATE  --   --   --   --   --   --  37.8   < > = values in this interval not displayed.    Cardiac Enzymes:  No results for input(s): "CKTOTAL", "CKMB", "CKMBINDEX", "TROPONINI" in the last 168 hours. CBG: Recent Labs  Lab 04/29/22 1630  GLUCAP 136*    Iron Studies: No results for input(s): "IRON", "TIBC", "TRANSFERRIN", "FERRITIN" in the last 72 hours. Studies/Results: CT HEAD CODE STROKE WO CONTRAST  Result Date: 04/29/2022 CLINICAL DATA:  Code stroke. Acute neuro deficit. Altered mental status, weakness EXAM: CT HEAD WITHOUT CONTRAST TECHNIQUE: Contiguous axial images were obtained from the base of the skull through the vertex without intravenous contrast. RADIATION DOSE REDUCTION: This exam was performed according to the departmental dose-optimization program which includes automated exposure control, adjustment of the mA and/or kV according to patient size and/or use of iterative reconstruction technique. COMPARISON:  CT head 03/09/2022 FINDINGS: Brain: Generalized atrophy and ventricular enlargement, stable. Ventricular enlargement consistent with atrophy. Moderate white matter hypodensity bilaterally is symmetric and  unchanged. Negative for acute infarct, hemorrhage, mass Vascular: Negative for hyperdense vessel. 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 Skull: Negative Sinuses/Orbits: Mild mucosal edema paranasal sinuses. Negative orbit Other: None ASPECTS ([place] Stroke Program Early CT Score) - Ganglionic level infarction (caudate, lentiform nuclei, internal capsule, insula, M1-M3 cortex): 7 - Supraganglionic infarction (M4-M6 cortex): 3 Total score (0-10 with 10 being normal): 10 IMPRESSION: 1. No acute abnormality. Atrophy and chronic microvascular ischemia. 2. Aspects is 10. 3. Code stroke imaging results were communicated on 04/29/2022 at 10:59 am to provider [facility] via Amion app Electronically Signed   By: Franchot Gallo M.D.   On: 04/29/2022 10:59    Medications: Infusions:  levETIRAcetam Stopped (04/29/22 2314)   sodium chloride (hypertonic) 30 mL/hr (04/30/22 0917)    Scheduled Medications:  aspirin EC  81 mg Oral Daily   atorvastatin  20 mg Oral Daily   Chlorhexidine Gluconate Cloth  6 each Topical Daily   clopidogrel  75 mg Oral Daily   vitamin B-12  500 mcg Oral Daily   enoxaparin (LOVENOX) injection  40 mg Subcutaneous H06C   folic acid  1 mg Oral Daily   gabapentin  300 mg Oral QHS   levETIRAcetam  1,000 mg Oral BID   multivitamin with minerals  1 tablet Oral Daily   pantoprazole  40 mg Oral Daily   thiamine  100 mg Oral Daily   Or   thiamine  100 mg Intravenous Daily    have reviewed scheduled and prn medications.  Physical Exam: General:NAD, comfortable Heart:RRR, s1s2 nl Lungs:clear b/l, no crackle Abdomen:soft, Non-tender, non-distended Extremities:No edema Neurology: Alert, awake but confused  Carlos Wise 04/30/2022,9:30 AM  LOS: 1 day

## 2022-04-30 NOTE — Progress Notes (Signed)
NAME:  Carlos Wise, MRN:  [id number], DOB:  04/19/1941, LOS: 1 ADMISSION DATE:  04/29/2022, CONSULTATION DATE: 11/15 REFERRING MD:  Steva Ready, CHIEF COMPLAINT: Acute hyponatremia, AMS  History of Present Illness:  Carlos Wise is a 80 y.o. M with PMH HTN, HL, prostate Ca, chronic hyponatremia, seizure disorder who presented with 2 days of generalized weakness and fall from standing at home.  He has a caregiver who gives a history of chronic hyponatremia in the 120's.  He also drinks 8 oz gin and tonic daily.   He was recently admitted in October at [facility] with encephalopathy and left corona radiata acute lacunar infarction.  His Keppra was increased at that time, however it was felt that this was making him agitated and so started on Trileptal in addition to [place].  His caregiver reports starting this two days before the onset  weakness and nausea.  He was not confused, but was low to answer questions.   On arrival to the ED he was hemodynamically stable, head CT negative and initial sodium 117.  He was seen by neurology, concern for oxcarbazepine induced hyponatremia and recommended hypertonic saline, so PCCM consulted for ICU admission  Pertinent  Medical History   Past Medical History:  Diagnosis Date   Herpes zoster    Hyperlipemia    Hypertension    Intestinal polyp    hyperplastic   Prostate cancer ([place])    S/P radiation therapy 12/12/2014 through 02/06/2015                                                      Prostate 7800 cGy in 40 sessions, seminal vesicles 5600 cGy in 40 sessions                           Seizure disorder ([place])    Significant Hospital Events: Including procedures, antibiotic start and stop dates in addition to other pertinent events   11/15 admit to PCCM for hypertonic saline   Interim History / Subjective:  No acute events overnight Patient alert and oriented x3 at this morning States he wants to go home  Objective   Blood pressure (!) 165/80, pulse 80,  temperature 98 F (36.7 C), temperature source Axillary, resp. rate (!) 21, height '5\' 8"'$  (1.727 m), weight 71.5 kg, SpO2 (!) 88 %.        Intake/Output Summary (Last 24 hours) at 04/30/2022 0719 Last data filed at 04/30/2022 0600 Gross per 24 hour  Intake 560.54 ml  Output 1425 ml  Net -864.46 ml   Filed Weights   04/29/22 1016 04/29/22 1600 04/30/22 0500  Weight: 79.4 kg 72.8 kg 71.5 kg    Examination: General: Well-appearing elderly man laying in bed.  NAD. HENT: [place]/AT. Dry mucous membrane. Lungs: Lungs CTAB.  No wheezing or rales. Cardiovascular: RRR.  S1, S2. Abdomen: Soft.  NT/ND.  Normal BS. Extremities: Warm and dry. Neuro: Alert and awake.  Oriented to self, person, place, time and situation.  No extremities.  Sodium 117 ->114->119->118->121->119->120->120->117 K+ 3.8, bicarb 20, creatinine 0.57, mag 1.7 UA unremarkable, UDS negative  Resolved Hospital Problem list    Assessment & Plan:   Severe Hyponatremia Acute on chronic hyponatremia Generalized weakness and acute encephalopathy Likely multi-factorial in the setting of possible medication-induced SIADH and  ETOH. Hx hyponatremia with baseline sodium 127-130.  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  Sodium 117 ->114 (11 am on 11/15)->119->118->121->119->120->120 (3:40 am 11/16). Sodium dropped to 117 this morning.  We will increase hypertonic saline and continue to monitor closely. -Nephrology following, appreciate recs -Increase hypertonic saline at 30 cc/h -Fluid restriction -Continue q4h sodium checks -Sodium goal around 125-126 tomorrow morning -monitor for seizure or neuro changes  History of Seizures  Oxcarbazepine discontinued by neurology.  Patient on Keppra bridged to therapeutic Lamictal level.  Unable to tolerate p.o. intake overnight so continue IV Keppra per neuro recommendation. -Continue IV Keppra 1000 mg BID -SLP eval  -Seizure precautions -Start  Lamictal when able to take p.o. -Please see neuro consult note on 11/15 for full Keppra bridge to therapeutic Lamictal  ETOH use  Reports drinking 8 oz gin and tonic daily -monitor for signs of withdrawal -MC, folic acid and thiamine   Hx CVA  HTN -continue ASA and statin -prn hydralazine and metoprolol    GERD -continue PPI   Peripheral Neuropathy -continue home Gabapentin  Best Practice (right click and "Reselect all SmartList Selections" daily)   Diet/type: Regular consistency (see orders) DVT prophylaxis: LMWH GI prophylaxis: PPI Lines: N/A Foley:  N/A Code Status:  full code Last date of multidisciplinary goals of care discussion [pending]  Labs   CBC: Recent Labs  Lab 04/29/22 1036 04/29/22 1059 04/29/22 1550 04/30/22 0342  WBC 10.0  --  9.7 10.2  NEUTROABS 8.0*  --   --   --   HGB 13.0 13.6 12.5* 12.1*  HCT 34.7* 40.0 33.8* RESULTS UNAVAILABLE DUE TO INTERFERING SUBSTANCE  MCV 85.7  --  87.1 RESULTS UNAVAILABLE DUE TO INTERFERING SUBSTANCE  PLT 252  --  246 161    Basic Metabolic Panel: Recent Labs  Lab 04/29/22 1036 04/29/22 1059 04/29/22 1630 04/29/22 1846 04/29/22 2037 04/29/22 2248 04/30/22 0030 04/30/22 0342  NA 117* 114* 119* 118* 121* 119* 120* 120*  K 3.8 3.9 4.0 4.0  --  3.9  --  3.8  CL 81* 81* 85* 86*  --  87*  --  86*  CO2 21*  --  20* 21*  --  20*  --  20*  GLUCOSE 103* 100* 117* 122*  --  101*  --  110*  BUN 6* 6* 6* 6*  --  6*  --  5*  CREATININE 0.71 0.60* 0.63 0.68  --  0.68  --  0.57*  CALCIUM 9.3  --  9.0 9.4  --  9.1  --  9.3  MG  --   --   --   --   --   --   --  1.7   GFR: Estimated Creatinine Clearance: 71.3 mL/min (A) (by C-G formula based on SCr of 0.57 mg/dL (L)). Recent Labs  Lab 04/29/22 1036 04/29/22 1550 04/30/22 0342  WBC 10.0 9.7 10.2    Liver Function Tests: Recent Labs  Lab 04/29/22 1036  AST 41  ALT 16  ALKPHOS 63  BILITOT 1.2  PROT 6.8  ALBUMIN 4.1   No results for input(s): "LIPASE",  "AMYLASE" in the last 168 hours. Recent Labs  Lab 04/29/22 1530  AMMONIA 31    ABG    Component Value Date/Time   HCO3 22.4 11/22/2021 1359   TCO2 24 04/29/2022 1059   O2SAT 100 11/22/2021 1359     Coagulation Profile: Recent Labs  Lab 04/29/22 1036  INR 1.1  Cardiac Enzymes: No results for input(s): "CKTOTAL", "CKMB", "CKMBINDEX", "TROPONINI" in the last 168 hours.  HbA1C: Hgb A1c MFr Bld  Date/Time Value Ref Range Status  03/02/2022 05:07 AM 5.1 4.8 - 5.6 % Final    Comment:    (NOTE) Pre diabetes:          5.7%-6.4%  Diabetes:              >6.4%  Glycemic control for   <7.0% adults with diabetes   02/04/2020 02:19 PM 5.6 4.8 - 5.6 % Final    Comment:    (NOTE) Pre diabetes:          5.7%-6.4%  Diabetes:              >6.4%  Glycemic control for   <7.0% adults with diabetes     CBG: Recent Labs  Lab 04/29/22 1630  [place] 136*    Review of Systems:   As stated in HPI  Past Medical History:  He,  has a past medical history of Herpes zoster, Hyperlipemia, Hypertension, Intestinal polyp, Prostate cancer ([place]), S/P radiation therapy (12/12/2014 through 02/06/2015                                                   ), and Seizure disorder ([place]).   Surgical History:   Past Surgical History:  Procedure Laterality Date   Carotid Artery Catherization     PROSTATE BIOPSY  06/27/2014     Social History:   reports that he has quit smoking. He has never used smokeless tobacco. He reports current alcohol use. He reports that he does not use drugs.   Family History:  His family history includes Colon cancer in his mother; Heart attack in his father; Prostate cancer in his father.   Allergies Allergies  Allergen Reactions   Oxcarbazepine     Significant and severe hyponatremia noted within a few days of starting Oxcarbazepine.   Penicillins Other (See Comments)    Patient states no allergy, but has preference that he does not want this medication      Home Medications  Prior to Admission medications   Medication Sig Start Date End Date Taking? Authorizing Provider  aspirin EC 81 MG tablet Take 81 mg by mouth daily. Swallow whole.   Yes [provider]  atorvastatin (LIPITOR) 20 MG tablet Take 20 mg by mouth daily.   Yes [provider]  Cholecalciferol (VITAMIN D3 PO) Take 1 capsule by mouth daily.   Yes [provider]  clobetasol cream (TEMOVATE) 4.09 % Apply 1 Application topically daily as needed (itching scalp).   Yes [provider]  clopidogrel (PLAVIX) 75 MG tablet Take 1 tablet (75 mg total) by mouth daily. 04/09/22  Yes Camara, Maryan Puls, MD  Cyanocobalamin (VITAMIN B-12 PO) Take 1 tablet by mouth daily.   Yes [provider]  escitalopram (LEXAPRO) 10 MG tablet Take 10 mg by mouth daily.   Yes [provider]  fluticasone (FLONASE) 50 MCG/ACT nasal spray Place 1 spray into both nostrils daily as needed for allergies. 12/10/19  Yes [provider]  folic acid (FOLVITE) 1 MG tablet Take 1 mg by mouth daily.   Yes [provider]  gabapentin (NEURONTIN) 300 MG capsule Take 300 mg by mouth at bedtime.   Yes [provider]  ketotifen (  ALLERGY EYE DROPS) 0.025 % ophthalmic solution Place 1 drop into both eyes daily as needed (red eyes).   Yes [provider]  levETIRAcetam (KEPPRA) 500 MG tablet Take 1,000 mg by mouth 2 (two) times daily. 04/21/22  Yes [provider]  Naproxen Sod-diphenhydrAMINE (ALEVE PM) 220-25 MG TABS Take 2 tablets by mouth at bedtime.   Yes [provider]  pantoprazole (PROTONIX) 40 MG tablet Take 40 mg by mouth daily.   Yes [provider]  Pseudoeph-Doxylamine-DM-APAP (NYQUIL PO) Take 2 capsules by mouth at bedtime as needed (sleep when aleve pm is no working).   Yes [provider]  sodium chloride (OCEAN) 0.65 % SOLN nasal spray Place 1 spray into both nostrils daily as needed for  congestion.   Yes [provider]  sodium chloride 1 g tablet Take 1 g by mouth 3 (three) times daily.   Yes [provider]  thiamine (VITAMIN B1) 100 MG tablet Take 250 mg by mouth daily. 02/16/22  Yes [provider]  triamcinolone cream (KENALOG) 0.1 % Apply 1 Application topically daily as needed (itching skin).   Yes [provider]  atorvastatin (LIPITOR) 80 MG tablet Take 1 tablet (80 mg total) by mouth daily. Patient not taking: Reported on 04/29/2022 03/25/22   Caren Griffins, MD     Critical care time: 44

## 2022-04-30 NOTE — Progress Notes (Signed)
eLink Physician-Brief Progress Note Patient Name: Carlos Wise DOB: 10-08-1940 MRN: [id number]   Date of Service  04/30/2022  HPI/Events of Note  Hyponatremia = Na+ = 119 --> 118 --> 121 --> 119  eICU Interventions  Plan: Increase 3% NaCl to 20 mL/hour.      Intervention Category Major Interventions: Electrolyte abnormality - evaluation and management  Mindee Robledo [place] 04/30/2022, 12:35 AM

## 2022-04-30 NOTE — Evaluation (Signed)
Physical Therapy Evaluation Patient Details Name: Carlos Wise MRN: [id number] DOB: 01/11/1941 Today's Date: 04/30/2022  History of Present Illness  81 yo male presents to [facility] on 11/15 with weakness, fall x2, AMS. Workup for hyponatremia due to possible medication intolerance, metabolic encephalopathy. 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  Clinical Impression   Pt presents with weakness, LE cramping R>L (pt has history of neuropathy and has not had his medication while acute), impaired balance with posterior bias and recent history of falls, and decreased activity tolerance. Pt to benefit from acute PT to address deficits. Pt tolerated transfer-level mobility with mod +2 assist, PT anticipates pt will progress well while acute and has 24/7 assist from caregivers. PT to progress mobility as tolerated, and will continue to follow acutely.         Recommendations for follow up therapy are one component of a multi-disciplinary discharge planning process, led by the attending physician.  Recommendations may be updated based on patient status, additional functional criteria and insurance authorization.  Follow Up Recommendations Home health PT      Assistance Recommended at Discharge Frequent or constant Supervision/Assistance  Patient can return home with the following  A lot of help with walking and/or transfers;A lot of help with bathing/dressing/bathroom;Direct supervision/assist for medications management;Direct supervision/assist for financial management;Assistance with cooking/housework;Assist for transportation;Help with stairs or ramp for entrance    Equipment Recommendations None recommended by PT  Recommendations for Other Services       Functional Status  Assessment Patient has had a recent decline in their functional status and demonstrates the ability to make significant improvements in function in a reasonable and predictable amount of time.     Precautions / Restrictions Precautions Precautions: Fall Precaution Comments: hx of neuropathy Restrictions Weight Bearing Restrictions: No      Mobility  Bed Mobility Overal bed mobility: Needs Assistance Bed Mobility: Supine to Sit     Supine to sit: Mod assist, +2 for physical assistance     General bed mobility comments: assist for BLE initiation to EOB, trunk elevation    Transfers Overall transfer level: Needs assistance Equipment used: Rolling walker (2 wheels) Transfers: Sit to/from Stand, Bed to chair/wheelchair/BSC Sit to Stand: Mod assist, +2 physical assistance, +2 safety/equipment   Step pivot transfers: Min assist, +2 physical assistance, +2 safety/equipment       General transfer comment: initial strong posterior lean, hx of neuropathy, poor foot placement during transfer and assist to manuever wide walker. assist to rise and steady, guide RW, and correct posterior bias    Ambulation/Gait               General Gait Details: nt  Financial trader Rankin (Stroke Patients Only)       Balance Overall balance assessment: Needs assistance Sitting-balance support: Bilateral upper extremity supported, Feet unsupported Sitting balance-Leahy Scale: Poor Sitting balance - Comments: initial strong posterior lean, trouble scooting towards EOB, once feet on ground, improved sitting balance from mod A to min guard Postural control: Posterior lean Standing balance support: Bilateral upper extremity supported, Reliant on assistive device for balance Standing balance-Leahy Scale: Poor  Pertinent Vitals/Pain Pain Assessment Pain Assessment: Faces Faces Pain Scale: Hurts even  more Pain Location: R knee Pain Descriptors / Indicators: Cramping, Grimacing Pain Intervention(s): Limited activity within patient's tolerance, Monitored during session, Repositioned    Home Living Family/patient expects to be discharged to:: Private residence Living Arrangements: Other (Comment) (caretaker) Available Help at Discharge: Personal care attendant;Available 24 hours/day Type of Home: House Home Access: Ramped entrance   Entrance Stairs-Number of Steps: 1   Home Layout: Multi-level Home Equipment: Chase (2 wheels);Shower seat;Grab bars - toilet;Grab bars - tub/shower;Hand held shower head Additional Comments: called [facility]    Prior Function Prior Level of Function : Needs assist             Mobility Comments: Pt using RW, getting PT 1x/wk ADLs Comments: has caregivers but is able to participate with ADLs     Hand Dominance   Dominant Hand: Right    Extremity/Trunk Assessment   Upper Extremity Assessment Upper Extremity Assessment: Defer to OT evaluation    Lower Extremity Assessment Lower Extremity Assessment: Generalized weakness;RLE deficits/detail;LLE deficits/detail RLE Sensation: history of peripheral neuropathy LLE Sensation: history of peripheral neuropathy    Cervical / Trunk Assessment Cervical / Trunk Assessment: Kyphotic  Communication   Communication: HOH  Cognition Arousal/Alertness: Awake/alert Behavior During Therapy: WFL for tasks assessed/performed Overall Cognitive Status: No family/caregiver present to determine baseline cognitive functioning Area of Impairment: Safety/judgement, Following commands, Problem solving                       Following Commands: Follows one step commands with increased time Safety/Judgement: Decreased awareness of safety, Decreased awareness of deficits   Problem Solving: Slow processing, Decreased initiation, Requires verbal cues General Comments: Pt overall coopertive  with therapists, sarcastic personality, increased time for command following - Pt is HOH        General Comments General comments (skin integrity, edema, etc.): vss. coughing after drinking x2, RN assessing at bedside    Exercises     Assessment/Plan    PT Assessment Patient needs continued PT services  PT Problem List Decreased strength;Decreased mobility;Decreased safety awareness;Decreased activity tolerance;Decreased balance;Decreased knowledge of use of DME;Decreased cognition;Pain       PT Treatment Interventions DME instruction;Therapeutic activities;Gait training;Therapeutic exercise;Patient/family education;Balance training;Functional mobility training;Neuromuscular re-education;Stair training    PT Goals (Current goals can be found in the Care Plan section)  Acute Rehab PT Goals PT Goal Formulation: With patient Time For Goal Achievement: 05/14/22 Potential to Achieve Goals: Good    Frequency Min 3X/week     Co-evaluation PT/OT/SLP Co-Evaluation/Treatment: Yes Reason for Co-Treatment: For patient/therapist safety;To address functional/ADL transfers;Necessary to address cognition/behavior during functional activity PT goals addressed during session: Mobility/safety with mobility;Balance OT goals addressed during session: ADL's and self-care;Strengthening/ROM;Proper use of Adaptive equipment and DME       AM-PAC PT "6 Clicks" Mobility  Outcome Measure Help needed turning from your back to your side while in a flat bed without using bedrails?: A Little Help needed moving from lying on your back to sitting on the side of a flat bed without using bedrails?: A Little Help needed moving to and from a bed to a chair (including a wheelchair)?: A Lot Help needed standing up from a chair using your arms (e.g., wheelchair or bedside chair)?: A Lot Help needed to walk in hospital room?: A Lot Help needed climbing 3-5 steps with a railing? : A Lot 6 Click Score: 14    End  of Session Equipment Utilized During Treatment: Gait belt Activity Tolerance: Patient tolerated treatment well;Patient limited by pain Patient left: in chair;with call bell/phone within reach;with chair alarm set Nurse Communication: Mobility status PT Visit Diagnosis: Other abnormalities of gait and mobility (R26.89);Muscle weakness (generalized) (M62.81)    Time: 4332-9518 PT Time Calculation (min) (ACUTE ONLY): 35 min   Charges:   PT Evaluation $PT Eval Low Complexity: 1 Low         Jaleigha Deane S, PT DPT Acute Rehabilitation Services Pager [phone]  Office [phone]   Bendena E Ruffin Pyo 04/30/2022, 11:25 AM

## 2022-04-30 NOTE — Progress Notes (Signed)
eLink Physician-Brief Progress Note Patient Name: Carlos Wise DOB: 10-16-1940 MRN: [id number]   Date of Service  04/30/2022  HPI/Events of Note  Shin pain - No improvement with Tylenol PR. Creatinine = 0.68.  eICU Interventions  Plan: Continue Tylenol PR. Toradol 15 mg IV X 1.     Intervention Category Major Interventions: Other:  Neidy Guerrieri Cornelia Copa 04/30/2022, 1:12 AM

## 2022-04-30 NOTE — Progress Notes (Signed)
eLink Physician-Brief Progress Note Patient Name: Carlos Wise DOB: [birth year] MRN: [id number]   Date of Service  04/30/2022  HPI/Events of Note  Hyponatremia - Na+ = 119 --> 120 --> 120.  eICU Interventions  Continue present management.      Intervention Category Major Interventions: Electrolyte abnormality - evaluation and management  Cristoval Teall Eugene 04/30/2022, 6:27 AM

## 2022-05-01 DIAGNOSIS — E871 Hypo-osmolality and hyponatremia: Secondary | ICD-10-CM | POA: Diagnosis not present

## 2022-05-01 LAB — BASIC METABOLIC PANEL
Anion gap: 10 (ref 5–15)
BUN: <5 mg/dL — ABNORMAL LOW (ref 8–23)
CO2: 23 mmol/L (ref 22–32)
Calcium: 9 mg/dL (ref 8.9–10.3)
Chloride: 90 mmol/L — ABNORMAL LOW (ref 98–111)
Creatinine, Ser: 0.67 mg/dL (ref 0.61–1.24)
GFR, Estimated: >60 mL/min (ref >60)
Glucose, Bld: 109 mg/dL — ABNORMAL HIGH (ref 70–99)
Potassium: 3.6 mmol/L (ref 3.5–5.1)
Sodium: 123 mmol/L — ABNORMAL LOW (ref 135–145)

## 2022-05-01 LAB — CBC
HCT: 31 % — ABNORMAL LOW (ref 39.0–52.0)
Hemoglobin: 11.6 g/dL — ABNORMAL LOW (ref 13.0–17.0)
MCH: 32.2 pg (ref 26.0–34.0)
MCHC: 37.4 g/dL — ABNORMAL HIGH (ref 30.0–36.0)
MCV: 86.1 fL (ref 80.0–100.0)
Platelets: 231 10*3/uL (ref 150–400)
RBC: 3.6 MIL/uL — ABNORMAL LOW (ref 4.22–5.81)
RDW: 11.9 % (ref 11.5–15.5)
WBC: 8.8 10*3/uL (ref 4.0–10.5)
nRBC: 0 % (ref 0.0–0.2)

## 2022-05-01 LAB — MAGNESIUM: Magnesium: 1.7 mg/dL (ref 1.7–2.4)

## 2022-05-01 LAB — SODIUM
Sodium: 122 mmol/L — ABNORMAL LOW (ref 135–145)
Sodium: 124 mmol/L — ABNORMAL LOW (ref 135–145)
Sodium: 126 mmol/L — ABNORMAL LOW (ref 135–145)
Sodium: 131 mmol/L — ABNORMAL LOW (ref 135–145)

## 2022-05-01 MED ORDER — POTASSIUM CHLORIDE CRYS ER 20 MEQ PO TBCR
40.0000 meq | EXTENDED_RELEASE_TABLET | Freq: Once | ORAL | Status: AC
  Administered 2022-05-01: 40 meq via ORAL
  Filled 2022-05-01: qty 2

## 2022-05-01 NOTE — Progress Notes (Signed)
Pharmacy Electrolyte Replacement  Recent Labs:  Recent Labs    05/01/22 0503  K 3.6  MG 1.7  CREATININE 0.67    Low Critical Values (K </= 2.5, Phos </= 1, Mg </= 1) Present: None  Plan: Replete with 56mq Kcl orally per protocol, recheck in AM.   HEsmeralda Arthur PharmD

## 2022-05-01 NOTE — Progress Notes (Signed)
Na improved to 130. Dc hypertonic fluid.  Continue to monitor lab.  D/w ICU nurse.  Dr. Carolin Sicks Nephrology

## 2022-05-01 NOTE — Progress Notes (Signed)
Speech Language Pathology Treatment: Dysphagia  Patient Details Name: Carlos Wise MRN: [id number] DOB: Dec 16, 1940 Today's Date: 05/01/2022 Time: 0626-9485 SLP Time Calculation (min) (ACUTE ONLY): 56 min  Assessment / Plan / Recommendation Clinical Impression  Pt continues to exhibit extremely prolonged (minutes long) oral holding, especially of liquids.  Pt taking oral meds with RN with NTL wash by cup.  Prolonged oral holding and grimacing noted. Pt with cough x1 following nectar thick liquid with pill and intermittent wet vocal quality.  Wet vocal quality increased with thin liquid trials.  Pt consistently required verbal cues to swallow with liquid trials, but is not receptive to cuing and is easily agitated by this.  Pt is was not receptive to education regarding aspiration pneumonia.  With solids oral phase is prolonged, but pt exhibits active mastication of bolus and swallows independently without cuing.  SLP assisted pt with AM meal tray.  Pt required assistance with use of utensils and may benefit from assistance with meals.  He expressed frustration that he is unable to feed himself as he typically does. He also would like to see his girlfriend, [name].  Pt requesting additional gabapentin.  RN aware.  Pt declined other pain medications. There is no new chest imaging concerning for pneumonia. Given that there was lo aspiration observed on MBS on 10/6 even in the setting of coughing/wet vocal quality, will not pursue repeat instrumental at this time.  SLP to follow for diet tolerance and possible advancement.   Recommend continuing regular texture diet with nectar thick liquid by cup at this time.     HPI HPI: Carlos Wise is a 81 y.o. male who presented to the hospital for evaluation of generalized weakness and change in mental status. CT 11/15 with no acute findings.  Patient was recently admitted here at [facility] and discharged on October 11 after being treated for  encephalopathy, acute failure to thrive and stroke.  MBS during this admission 10/3 revealing a "mild, primarily oral dysphagia" with recommendations for D3/Thin. Pt with with medical history significant of alcohol abuse, adult failure to thrive, history of seizure, GERD, recent CVA/concussion.      SLP Plan  Continue with current plan of care      Recommendations for follow up therapy are one component of a multi-disciplinary discharge planning process, led by the attending physician.  Recommendations may be updated based on patient status, additional functional criteria and insurance authorization.    Recommendations  Diet recommendations: Regular;Nectar-thick liquid Liquids provided via: Cup Medication Administration: Whole meds with liquid (or puree) Supervision: Staff to assist with self feeding Compensations: Slow rate;Small sips/bites Postural Changes and/or Swallow Maneuvers: Seated upright 90 degrees                Oral Care Recommendations: Oral care BID Follow Up Recommendations:  (Continue ST at next level of care) SLP Visit Diagnosis: Dysphagia, oropharyngeal phase (R13.12) Plan: Continue with current plan of care           Celedonio Savage, [place], [place] Office: [phone] 05/01/2022, 11:06 AM

## 2022-05-01 NOTE — Progress Notes (Addendum)
[facility]PHROLOG[facility] PROGRESS NOTE  Assessment/ Plan: Pt is a 81 y.o. 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  He presented with change in mental status and generalized weakness.  On salt tablet at home for chronic hyponatremia. Urine sodium 110, osmolality 332, low uric acid level, TSH level acceptable. Started hypertonic saline on 11/15 with very slow improvement of serum sodium level.  I will increase the fluid rate to 50 cc an hour and continue to monitor labs.  Changed to fluid restriction 1200 cc/24 hrs.  Discussed with ICU nurse. Goal sodium around 128-129 by tomorrow AM.  Avoid rapid correction.  Lexapro and oxcarbazepine discontinued.  #Acute metabolic encephalopathy due to electrolytes abnormalities mainly hyponatremia: Management as above.  CT scan with brain atrophy and previous stroke.   #Hypocalcemia: Replating calcium supplement.   #History of seizure disorder: Per neurology team.  Recommend to avoid carbamazepine, Lexapro.   Subjective: Seen and examined in ICU.  Clinically remains same without much improvement.  Sodium level slow to improve.  Discussed with ICU team.  Urine output is recorded around 1.4 L. Objective Vital signs in last 24 hours: Vitals:   05/01/22 0402 05/01/22 0500 05/01/22 0600 05/01/22 0700  BP:  (!) 163/85 (!) 149/80   Pulse:  72 75   Resp:  17 19   Temp:    98.3 F (36.8 C)  TempSrc:    Oral  SpO2:  97% 96%   Weight: 72.2 kg     Height:       Weight change:  -7.18 kg  Intake/Output Summary (Last 24 hours) at 05/01/2022 0806 Last data filed at 05/01/2022 0600 Gross per 24 hour  Intake 830.23 ml  Output 1450 ml  Net -619.77 ml        Labs: RENAL PANEL Recent Labs    11/22/21 1336 11/22/21 1359 02/28/22 1630 02/28/22 1701 03/09/22 1310 03/10/22 0451 03/11/22 0754 03/12/22 0313 03/13/22 0228 03/14/22 3254 03/15/22 0507 03/18/22 0300 03/19/22 0234 03/20/22 0210 03/21/22 0159 03/22/22 0300 03/23/22 0037 03/24/22 0025 04/29/22 1036 04/29/22 1059 04/29/22 1630 04/29/22 1846 04/29/22 2037 04/29/22 2248 04/30/22 0030 04/30/22 0342 04/30/22 0702 04/30/22 1107 04/30/22 1315 04/30/22 1645 04/30/22 2032 05/01/22 0103 05/01/22 0503  NA 126*   < > 125*  125*   < > 125*   < > 125* 128* 127* 128*   < > 132* 128* 129* 130* 129* 131* 132* 117* 114* 119* 118*   < > 119* 120* 120* 117* 120* 122* 121* 122* 122* 123*  K 4.3   < > 5.2*   < > 4.7   < > 3.8 3.9 3.9 4.1   < > 4.3 4.0 4.3 3.8 4.1 4.9 4.3 3.8 3.9 4.0 4.0  --  3.9  --  3.8 3.8 3.7  --   --   --   --  3.6  CL 91*   < > 90*   < > 90*   < > 95* 92* 91* 96*   < > 96* 94* 93* 94* 93* 93* 95* 81* 81* 85* 86*  --  87*  --  86* 86* 89*  --   --   --   --  90*  CO2 22   < > 27   < > 25   < > 21* '25 25 23   '$ < > '26 25 25 25 24 27 26 '$ 21*  --  20* 21*  --  20*  --  20* 21* 21*  --   --   --   --  23  GLUCOSE 125*   < > 100*   < > 104*   < > 153* 125* 123* 111*   < > 115* 118* 93 128* 128* 94 95 103* 100* 117* 122*  --  101*  --  110* 104* 135*  --   --   --   --  109*  BUN 12   < > 11   < > 9   < > '13 16 14 16   '$ < > '22 22 21 18 20 22 20 '$ 6* 6* 6* 6*  --  6*  --  5* 5* 6*  --   --   --   --  <5*  CREATININE 0.89   < > 0.80   < > 0.95   < > 0.76 0.69 0.64 0.62   < > 0.69 0.65 0.71 0.61 0.68 0.69 0.82 0.71 0.60* 0.63 0.68  --  0.68  --  0.57* 0.63 0.68  --   --   --   --  0.67  CALCIUM 9.5   < > 9.3   < > 9.4   < > 8.7* 9.2 8.8* 9.1   < > 8.9 9.0 9.7 9.3 9.2 9.9 9.3 9.3  --  9.0 9.4  --  9.1   --  9.3 8.8* 9.0  --   --   --   --  9.0  MG  --    < >  --    < >  --    < > 1.8 2.0 1.9 2.0  --  2.0 2.0 2.1 2.3 2.1 2.2 2.1  --   --   --   --   --   --   --  1.7  --   --   --   --   --   --  1.7  PHOS  --   --   --   --   --   --  2.6 2.4* 3.1 3.9  --   --   --   --   --   --   --   --   --   --   --   --   --   --   --   --   --   --   --   --   --   --   --   ALBUMIN 4.2  --  4.2  --  4.1  --   --   --   --   --   --   --   --   --   --   --   --   --  4.1  --   --   --   --   --   --   --   --   --   --   --   --   --   --    < > = values in this interval not displayed.  Liver Function Tests: Recent Labs  Lab 04/29/22 1036  AST 41  ALT 16  ALKPHOS 63  BILITOT 1.2  PROT 6.8  ALBUMIN 4.1    No results for input(s): "LIPASE", "AMYLASE" in the last 168 hours. Recent Labs  Lab 04/29/22 1530  AMMONIA 31    CBC: Recent Labs    11/23/21 0148 11/23/21 0628 04/29/22 1036 04/29/22 1059 04/29/22 1550 04/30/22 0342 05/01/22 0503  HGB  --    < > 13.0 13.6 12.5* 12.1* 11.6*  MCV  --    < > 85.7  --  87.1 RESULTS UNAVAILABLE DUE TO INTERFERING SUBSTANCE 86.1  VITAMINB12 919*  --   --   --  827  --   --   FOLATE  --   --   --   --   --  37.8  --    < > = values in this interval not displayed.     Cardiac Enzymes: No results for input(s): "CKTOTAL", "CKMB", "CKMBINDEX", "TROPONINI" in the last 168 hours. CBG: Recent Labs  Lab 04/29/22 1630  GLUCAP 136*     Iron Studies: No results for input(s): "IRON", "TIBC", "TRANSFERRIN", "FERRITIN" in the last 72 hours. Studies/Results: DG Knee 1-2 Views Right  Result Date: 04/30/2022 CLINICAL DATA:  Right knee pain and swelling.  Fall yesterday. EXAM: RIGHT KNEE - 1-2 VIEW COMPARISON:  None Available. FINDINGS: The bones appear mildly demineralized. No evidence of acute fracture or dislocation. The joint spaces are relatively preserved for age. No joint effusion or foreign body identified. There are scattered vascular  calcifications. IMPRESSION: No evidence of acute fracture or dislocation. Vascular calcifications. Electronically Signed   By: Richardean Sale M.D.   On: 04/30/2022 14:35   CT HEAD CODE STROKE WO CONTRAST  Result Date: 04/29/2022 CLINICAL DATA:  Code stroke. Acute neuro deficit. Altered mental status, weakness EXAM: CT HEAD WITHOUT CONTRAST TECHNIQUE: Contiguous axial images were obtained from the base of the skull through the vertex without intravenous contrast. RADIATION DOSE REDUCTION: This exam was performed according to the departmental dose-optimization program which includes automated exposure control, adjustment of the mA and/or kV according to patient size and/or use of iterative reconstruction technique. COMPARISON:  CT head 03/09/2022 FINDINGS: Brain: Generalized atrophy and ventricular enlargement, stable. Ventricular enlargement consistent with atrophy. Moderate white matter hypodensity bilaterally is symmetric and unchanged. Negative for acute infarct, hemorrhage, mass Vascular: Negative for hyperdense vessel. 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 Skull: Negative Sinuses/Orbits: Mild mucosal edema paranasal sinuses. Negative orbit Other: None ASPECTS (Parker Stroke Program Early CT Score) - Ganglionic level infarction (caudate, lentiform nuclei, internal capsule, insula, M1-M3 cortex): 7 - Supraganglionic infarction (M4-M6 cortex): 3 Total score (0-10 with 10 being normal): 10 IMPRESSION: 1. No acute abnormality. Atrophy and chronic microvascular ischemia. 2. Aspects is 10. 3. Code stroke imaging results were communicated on 04/29/2022 at 10:59 am to provider [facility] via Am[employer] app Electronically Signed   By: Franchot Gallo M.D.   On: 04/29/2022 10:59    Medications: Infusions:  sodium chloride (hypertonic) 50 mL/hr at 05/01/22 0741    Scheduled Medications:  aspirin EC  81 mg Oral Daily   atorvastatin  20 mg Oral Daily   Chlorhexidine Gluconate Cloth  6 each Topical  Daily   clopidogrel  75 mg Oral Daily   vitamin B-12  500 mcg Oral Daily   enoxaparin (LOVENOX) injection  40 mg Subcutaneous N36R   folic acid  1 mg Oral Daily   gabapentin  300 mg Oral QHS   [START ON 06/11/2022] lamoTRIgine  100 mg Oral q morning   And   [START ON 06/11/2022] lamoTRIgine  75 mg Oral QPM   [START ON 06/18/2022] lamoTRIgine  100 mg Oral BID   lamoTRIgine  25 mg Oral Daily   Followed by   Derrill Memo ON 05/07/2022] lamoTRIgine  25 mg Oral BID   [START ON 05/14/2022] lamoTRIgine  50 mg Oral q morning   And   [START ON 05/14/2022] lamoTRIgine  25 mg Oral QPM   [START ON 05/21/2022] lamoTRIgine  50 mg Oral BID   [START ON 05/28/2022] lamoTRIgine  75 mg Oral Daily   And   [START ON 05/28/2022] lamoTRIgine  50 mg Oral QPM   [START ON 06/04/2022] lamoTRIgine  75 mg Oral BID   levETIRAcetam  1,000 mg Oral BID   Followed by   Derrill Memo ON 06/11/2022] levETIRAcetam  500 mg Oral BID   multivitamin with minerals  1 tablet Oral Daily   pantoprazole  40 mg Oral Daily   thiamine  100 mg Oral Daily   Or   thiamine  100 mg Intravenous Daily    have reviewed scheduled and prn medications.  Physical Exam: General:NAD, comfortable, Heart:RRR, s1s2 nl Lungs:clear b/l, no crackle Abdomen:soft, Non-tender, non-distended Extremities:No edema Neurology: Alert, awake but confused  Carlos Wise Furry 05/01/2022,8:06 AM  LOS: 2 days

## 2022-05-01 NOTE — Progress Notes (Addendum)
NAME:  Carlos Wise, MRN:  [id number], DOB:  31-Aug-1940, LOS: 2 ADMISSION DATE:  04/29/2022, CONSULTATION DATE: 11/15 REFERRING MD:  Reesa Chew, [facility], CHIEF COMPLAINT: Acute hyponatremia, AMS  History of Present Illness:  Carlos Wise is a 81 y.o. M with PMH HTN, HL, prostate Ca, chronic hyponatremia, seizure disorder who presented with 2 days of generalized weakness and fall from standing at home.  He has a caregiver who gives a history of chronic hyponatremia in the 120's.  He also drinks 8 oz gin and tonic daily.   He was recently admitted in October at [facility] with encephalopathy and left corona radiata acute lacunar infarction.  His Keppra was increased at that time, however it was felt that this was making him agitated and so started on Trileptal in addition to [place].  His caregiver reports starting this two days before the onset  weakness and nausea.  He was not confused, but was low to answer questions.   On arrival to the ED he was hemodynamically stable, head CT negative and initial sodium 117.  He was seen by neurology, concern for oxcarbazepine induced hyponatremia and recommended hypertonic saline, so PCCM consulted for ICU admission  Pertinent  Medical History   Past Medical History:  Diagnosis Date   Herpes zoster    Hyperlipemia    Hypertension    Intestinal polyp    hyperplastic   Prostate cancer ([place])    S/P radiation therapy 12/12/2014 through 02/06/2015                                                      Prostate 7800 cGy in 40 sessions, seminal vesicles 5600 cGy in 40 sessions                           Seizure disorder ([place])    Significant Hospital Events: Including procedures, antibiotic start and stop dates in addition to other pertinent events   11/15 admit to PCCM for hypertonic saline   Interim History / Subjective:  No acute events overnight Patient doing well this morning No acute concerns.  Would like to go home.  Objective   Blood pressure (!) 149/80, pulse 75,  temperature 98.3 F (36.8 C), temperature source Oral, resp. rate 19, height '5\' 8"'$  (1.727 m), weight 72.2 kg, SpO2 96 %.        Intake/Output Summary (Last 24 hours) at 05/01/2022 0821 Last data filed at 05/01/2022 0600 Gross per 24 hour  Intake 830.23 ml  Output 1450 ml  Net -619.77 ml   Filed Weights   04/29/22 1600 04/30/22 0500 05/01/22 0402  Weight: 72.8 kg 71.5 kg 72.2 kg    Examination: General: Well-appearing elderly man laying in bed.  NAD. HENT: [place]/AT.  Dry mucous membrane. Lungs: Lungs CTAB.  No wheezing or rales. Cardiovascular: RRR.  S1, S2. Abdomen: Soft.  NT/ND.  Normal BS. Extremities: Warm and dry. Neuro: Alert and awake. Oriented to self, person, place, time and situation.  Moving all extremities.  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 Hx  hyponatremia with baseline sodium 127-130.  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  Sodium 117 ->114 (11 am on 11/15). Gradually improved to 123 this AM. Mental status at baseline.  -Nephrology following, appreciate recs -Increase hypertonic saline at 50 cc/h -Continue fluid restrictions -Continue q6h sodium checks -Sodium goal 128-129 by tomorrow morning -monitor for seizure or neuro changes  History of Seizures  Oxcarbazepine discontinued by neurology.  Patient on Keppra bridged to therapeutic Lamictal level.   -Continue Keppra and Lamictal -Seizure precautions -Please see neuro consult note on 11/15 for full Keppra bridge to therapeutic Lamictal  ETOH use  Reports drinking 8 oz gin and tonic daily -monitor for  signs of withdrawal -MC, folic acid and thiamine   Hx CVA  HTN -continue ASA and statin -prn hydralazine and metoprolol    GERD -continue PPI   Peripheral Neuropathy -continue home Gabapentin  Best Practice (right click and "Reselect all SmartList Selections" daily)   Diet/type: Regular consistency (see orders) DVT prophylaxis: LMWH GI prophylaxis: PPI Lines: N/A Foley:  N/A Code Status:  full code Last date of multidisciplinary goals of care discussion [11/16, updated aide at bedside]  Labs   CBC: Recent Labs  Lab 04/29/22 1036 04/29/22 1059 04/29/22 1550 04/30/22 0342 05/01/22 0503  WBC 10.0  --  9.7 10.2 8.8  NEUTROABS 8.0*  --   --   --   --   HGB 13.0 13.6 12.5* 12.1* 11.6*  HCT 34.7* 40.0 33.8* RESULTS UNAVAILABLE DUE TO INTERFERING SUBSTANCE 31.0*  MCV 85.7  --  87.1 RESULTS UNAVAILABLE DUE TO INTERFERING SUBSTANCE 86.1  PLT 252  --  246 243 449    Basic Metabolic Panel: Recent Labs  Lab 04/29/22 2248 04/30/22 0030 04/30/22 0342 04/30/22 0702 04/30/22 1107 04/30/22 1315 04/30/22 1645 04/30/22 2032 05/01/22 0103 05/01/22 0503  NA 119*   < > 120* 117* 120* 122* 121* 122* 122* 123*  K 3.9  --  3.8 3.8 3.7  --   --   --   --  3.6  CL 87*  --  86* 86* 89*  --   --   --   --  90*  CO2 20*  --  20* 21* 21*  --   --   --   --  23  GLUCOSE 101*  --  110* 104* 135*  --   --   --   --  109*  BUN 6*  --  5* 5* 6*  --   --   --   --  <5*  CREATININE 0.68  --  0.57* 0.63 0.68  --   --   --   --  0.67  CALCIUM 9.1  --  9.3 8.8* 9.0  --   --   --   --  9.0  MG  --   --  1.7  --   --   --   --   --   --  1.7   < > = values in this interval not displayed.   GFR: Estimated Creatinine Clearance: 71.3 mL/min (by C-G formula based on SCr of 0.67 mg/dL). Recent Labs  Lab 04/29/22 1036 04/29/22 1550 04/30/22 0342 05/01/22 0503  WBC 10.0 9.7 10.2 8.8    Liver Function Tests: Recent Labs  Lab 04/29/22 1036  AST 41  ALT 16  ALKPHOS 63  BILITOT 1.2  PROT  6.8  ALBUMIN 4.1   No results for input(s): "LIPASE", "AMYLASE" in  the last 168 hours. Recent Labs  Lab 04/29/22 1530  AMMONIA 31    ABG    Component Value Date/Time   HCO3 22.4 11/22/2021 1359   TCO2 24 04/29/2022 1059   O2SAT 100 11/22/2021 1359     Coagulation Profile: Recent Labs  Lab 04/29/22 1036  INR 1.1    Cardiac Enzymes: No results for input(s): "CKTOTAL", "CKMB", "CKMBINDEX", "TROPONINI" in the last 168 hours.  HbA1C: Hgb A1c MFr Bld  Date/Time Value Ref Range Status  03/02/2022 05:07 AM 5.1 4.8 - 5.6 % Final    Comment:    (NOTE) Pre diabetes:          5.7%-6.4%  Diabetes:              >6.4%  Glycemic control for   <7.0% adults with diabetes   02/04/2020 02:19 PM 5.6 4.8 - 5.6 % Final    Comment:    (NOTE) Pre diabetes:          5.7%-6.4%  Diabetes:              >6.4%  Glycemic control for   <7.0% adults with diabetes     CBG: Recent Labs  Lab 04/29/22 1630  [place] 136*    Review of Systems:   As stated in HPI  Past Medical History:  He,  has a past medical history of Herpes zoster, Hyperlipemia, Hypertension, Intestinal polyp, Prostate cancer ([place]), S/P radiation therapy (12/12/2014 through 02/06/2015                                                   ), and Seizure disorder ([place]).   Surgical History:   Past Surgical History:  Procedure Laterality Date   Carotid Artery Catherization     PROSTATE BIOPSY  06/27/2014     Social History:   reports that he has quit smoking. He has never used smokeless tobacco. He reports current alcohol use. He reports that he does not use drugs.   Family History:  His family history includes Colon cancer in his mother; Heart attack in his father; Prostate cancer in his father.   Allergies Allergies  Allergen Reactions   Oxcarbazepine     Significant and severe hyponatremia noted within a few days of starting Oxcarbazepine.   Penicillins Other (See Comments)    Patient states no allergy, but  has preference that he does not want this medication     Home Medications  Prior to Admission medications   Medication Sig Start Date End Date Taking? Authorizing Provider  aspirin EC 81 MG tablet Take 81 mg by mouth daily. Swallow whole.   Yes [provider]  atorvastatin (LIPITOR) 20 MG tablet Take 20 mg by mouth daily.   Yes [provider]  Cholecalciferol (VITAMIN D3 PO) Take 1 capsule by mouth daily.   Yes [provider]  clobetasol cream (TEMOVATE) 3.50 % Apply 1 Application topically daily as needed (itching scalp).   Yes [provider]  clopidogrel (PLAVIX) 75 MG tablet Take 1 tablet (75 mg total) by mouth daily. 04/09/22  Yes Camara, Maryan Puls, MD  Cyanocobalamin (VITAMIN B-12 PO) Take 1 tablet by mouth daily.   Yes [provider]  escitalopram (LEXAPRO) 10 MG tablet Take 10 mg by mouth daily.   Yes [provider]  fluticasone (FLONASE) 50 MCG/ACT  nasal spray Place 1 spray into both nostrils daily as needed for allergies. 12/10/19  Yes [provider]  folic acid (FOLVITE) 1 MG tablet Take 1 mg by mouth daily.   Yes [provider]  gabapentin (NEURONTIN) 300 MG capsule Take 300 mg by mouth at bedtime.   Yes [provider]  ketotifen (ALLERGY EYE DROPS) 0.025 % ophthalmic solution Place 1 drop into both eyes daily as needed (red eyes).   Yes [provider]  levETIRAcetam (KEPPRA) 500 MG tablet Take 1,000 mg by mouth 2 (two) times daily. 04/21/22  Yes [provider]  Naproxen Sod-diphenhydrAMINE (ALEVE PM) 220-25 MG TABS Take 2 tablets by mouth at bedtime.   Yes [provider]  pantoprazole (PROTONIX) 40 MG tablet Take 40 mg by mouth daily.   Yes [provider]  Pseudoeph-Doxylamine-DM-APAP (NYQUIL PO) Take 2 capsules by mouth at bedtime as needed (sleep when aleve pm is no working).   Yes [provider]  sodium chloride (OCEAN) 0.65 % SOLN nasal spray  Place 1 spray into both nostrils daily as needed for congestion.   Yes [provider]  sodium chloride 1 g tablet Take 1 g by mouth 3 (three) times daily.   Yes [provider]  thiamine (VITAMIN B1) 100 MG tablet Take 250 mg by mouth daily. 02/16/22  Yes [provider]  triamcinolone cream (KENALOG) 0.1 % Apply 1 Application topically daily as needed (itching skin).   Yes [provider]  atorvastatin (LIPITOR) 80 MG tablet Take 1 tablet (80 mg total) by mouth daily. Patient not taking: Reported on 04/29/2022 03/25/22   Caren Griffins, MD     Critical care time: 49

## 2022-05-02 DIAGNOSIS — E871 Hypo-osmolality and hyponatremia: Secondary | ICD-10-CM | POA: Diagnosis not present

## 2022-05-02 LAB — BASIC METABOLIC PANEL
Anion gap: 9 (ref 5–15)
BUN: <5 mg/dL — ABNORMAL LOW (ref 8–23)
CO2: 24 mmol/L (ref 22–32)
Calcium: 8.8 mg/dL — ABNORMAL LOW (ref 8.9–10.3)
Chloride: 98 mmol/L (ref 98–111)
Creatinine, Ser: 0.66 mg/dL (ref 0.61–1.24)
GFR, Estimated: >60 mL/min (ref >60)
Glucose, Bld: 108 mg/dL — ABNORMAL HIGH (ref 70–99)
Potassium: 3.9 mmol/L (ref 3.5–5.1)
Sodium: 131 mmol/L — ABNORMAL LOW (ref 135–145)

## 2022-05-02 LAB — CBC
HCT: 31.4 % — ABNORMAL LOW (ref 39.0–52.0)
Hemoglobin: 11.1 g/dL — ABNORMAL LOW (ref 13.0–17.0)
MCH: 32.2 pg (ref 26.0–34.0)
MCHC: 35.4 g/dL (ref 30.0–36.0)
MCV: 91 fL (ref 80.0–100.0)
Platelets: 255 10*3/uL (ref 150–400)
RBC: 3.45 MIL/uL — ABNORMAL LOW (ref 4.22–5.81)
RDW: 12.3 % (ref 11.5–15.5)
WBC: 8.7 10*3/uL (ref 4.0–10.5)
nRBC: 0 % (ref 0.0–0.2)

## 2022-05-02 LAB — MAGNESIUM: Magnesium: 1.8 mg/dL (ref 1.7–2.4)

## 2022-05-02 MED ORDER — SODIUM CHLORIDE 1 G PO TABS
1.0000 g | ORAL_TABLET | Freq: Three times a day (TID) | ORAL | Status: DC
  Administered 2022-05-02 – 2022-05-03 (×4): 1 g via ORAL
  Filled 2022-05-02 (×6): qty 1

## 2022-05-02 MED ORDER — MAGNESIUM SULFATE 2 GM/50ML IV SOLN
2.0000 g | Freq: Once | INTRAVENOUS | Status: AC
  Administered 2022-05-02: 2 g via INTRAVENOUS
  Filled 2022-05-02: qty 50

## 2022-05-02 NOTE — Progress Notes (Signed)
NAME:  Carlos Wise, MRN:  [id number], DOB:  02/05/1941, LOS: 3 ADMISSION DATE:  04/29/2022, CONSULTATION DATE: 11/15 REFERRING MD:  Reesa Chew, [facility], CHIEF COMPLAINT: Acute hyponatremia, AMS  History of Present Illness:  Carlos Wise is a 81 y.o. M with PMH HTN, HL, prostate Ca, chronic hyponatremia, seizure disorder who presented with 2 days of generalized weakness and fall from standing at home.  He has a caregiver who gives a history of chronic hyponatremia in the 120's.  He also drinks 8 oz gin and tonic daily.   He was recently admitted in October at [facility] with encephalopathy and left corona radiata acute lacunar infarction.  His Keppra was increased at that time, however it was felt that this was making him agitated and so started on Trileptal in addition to [place].  His caregiver reports starting this two days before the onset  weakness and nausea.  He was not confused, but was low to answer questions.   On arrival to the ED he was hemodynamically stable, head CT negative and initial sodium 117.  He was seen by neurology, concern for oxcarbazepine induced hyponatremia and recommended hypertonic saline, so PCCM consulted for ICU admission  Pertinent  Medical History   Past Medical History:  Diagnosis Date   Herpes zoster    Hyperlipemia    Hypertension    Intestinal polyp    hyperplastic   Prostate cancer ([place])    S/P radiation therapy 12/12/2014 through 02/06/2015                                                      Prostate 7800 cGy in 40 sessions, seminal vesicles 5600 cGy in 40 sessions                           Seizure disorder ([place])    Significant Hospital Events: Including procedures, antibiotic start and stop dates in addition to other pertinent events   11/15 admit to PCCM for hypertonic saline   Interim History / Subjective:   Patient remains critically ill in the intensive care unit, sodium watch for acute on chronic hyponatremia in the setting of SIADH.  Induced.  Nephrology  following.  Objective   Blood pressure 119/70, pulse 63, temperature 98.1 F (36.7 C), temperature source Axillary, resp. rate 17, height '5\' 8"'$  (1.727 m), weight 73.6 kg, SpO2 99 %.        Intake/Output Summary (Last 24 hours) at 05/02/2022 1039 Last data filed at 05/02/2022 0600 Gross per 24 hour  Intake 668 ml  Output --  Net 668 ml   Filed Weights   04/30/22 0500 05/01/22 0402 05/02/22 0327  Weight: 71.5 kg 72.2 kg 73.6 kg    Examination: General: Elderly gentleman resting in bed HENT: NCAT, mucous memories dry tracking appropriately Lungs: Clear to auscultation bilaterally no crackles no wheeze Cardiovascular: Regular rhythm S1-S2 Abdomen: Soft, nontender nondistended Extremities: Warm dry Neuro: Alert oriented following commands  Sodium 131  Resolved Hospital Problem list    Assessment & Plan:   Severe Hyponatremia Acute on chronic hyponatremia Generalized weakness and acute encephalopathy Likely multi-factorial in the setting of 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 Hx hyponatremia with baseline sodium 127-130.  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  Sodium 117 ->114 (11  am on 11/15). Gradually improved to 123 this AM. Mental status at baseline.  Plan: Appreciate nephrology and fluid Hypertonic saline stopped on the 17th Continue salt tablets and fluid restriction Every 6 hours sodium checks Sodium slowly improving now 131. Remains intensive care unit for neurologic checks. Suspect able to be picked up by [facility] tomorrow.  History of Seizures  Oxcarbazepine discontinued by neurology.  Patient on Keppra bridged to therapeutic Lamictal level.   Plan: Continue Keppra and Lamictal Seizure precautions  ETOH use  Reports drinking 8 oz gin and tonic daily Plan: Continue to monitor for any signs of withdrawal, multivitamin folic acid and thiamine supplementation   Hx CVA  HTN Plan: Continue aspirin statin As  needed hydralazine and metoprolol    GERD Continue PPI   Peripheral Neuropathy -Continue home gabapentin  Best Practice (right click and "Reselect all SmartList Selections" daily)   Diet/type: Regular consistency (see orders) DVT prophylaxis: LMWH GI prophylaxis: PPI Lines: N/A Foley:  N/A Code Status:  full code Last date of multidisciplinary goals of care discussion [11/16, updated aide at bedside]  Labs   CBC: Recent Labs  Lab 04/29/22 1036 04/29/22 1059 04/29/22 1550 04/30/22 0342 05/01/22 0503 05/02/22 0251  WBC 10.0  --  9.7 10.2 8.8 8.7  NEUTROABS 8.0*  --   --   --   --   --   HGB 13.0 13.6 12.5* 12.1* 11.6* 11.1*  HCT 34.7* 40.0 33.8* RESULTS UNAVAILABLE DUE TO INTERFERING SUBSTANCE 31.0* 31.4*  MCV 85.7  --  87.1 RESULTS UNAVAILABLE DUE TO INTERFERING SUBSTANCE 86.1 91.0  PLT 252  --  246 243 231 657    Basic Metabolic Panel: Recent Labs  Lab 04/30/22 0342 04/30/22 0702 04/30/22 1107 04/30/22 1315 05/01/22 0503 05/01/22 1010 05/01/22 1521 05/01/22 2120 05/02/22 0251  NA 120* 117* 120*   < > 123* 124* 126* 131* 131*  K 3.8 3.8 3.7  --  3.6  --   --   --  3.9  CL 86* 86* 89*  --  90*  --   --   --  98  CO2 20* 21* 21*  --  23  --   --   --  24  GLUCOSE 110* 104* 135*  --  109*  --   --   --  108*  BUN 5* 5* 6*  --  <5*  --   --   --  <5*  CREATININE 0.57* 0.63 0.68  --  0.67  --   --   --  0.66  CALCIUM 9.3 8.8* 9.0  --  9.0  --   --   --  8.8*  MG 1.7  --   --   --  1.7  --   --   --  1.8   < > = values in this interval not displayed.   GFR: Estimated Creatinine Clearance: 71.3 mL/min (by C-G formula based on SCr of 0.66 mg/dL). Recent Labs  Lab 04/29/22 1550 04/30/22 0342 05/01/22 0503 05/02/22 0251  WBC 9.7 10.2 8.8 8.7    Liver Function Tests: Recent Labs  Lab 04/29/22 1036  AST 41  ALT 16  ALKPHOS 63  BILITOT 1.2  PROT 6.8  ALBUMIN 4.1   No results for input(s): "LIPASE", "AMYLASE" in the last 168 hours. Recent Labs  Lab  04/29/22 1530  AMMONIA 31    ABG    Component Value Date/Time   HCO3 22.4 11/22/2021 1359   TCO2 24 04/29/2022  1059   O2SAT 100 11/22/2021 1359     Coagulation Profile: Recent Labs  Lab 04/29/22 1036  INR 1.1    Cardiac Enzymes: No results for input(s): "CKTOTAL", "CKMB", "CKMBINDEX", "TROPONINI" in the last 168 hours.  HbA1C: Hgb A1c MFr Bld  Date/Time Value Ref Range Status  03/02/2022 05:07 AM 5.1 4.8 - 5.6 % Final    Comment:    (NOTE) Pre diabetes:          5.7%-6.4%  Diabetes:              >6.4%  Glycemic control for   <7.0% adults with diabetes   02/04/2020 02:19 PM 5.6 4.8 - 5.6 % Final    Comment:    (NOTE) Pre diabetes:          5.7%-6.4%  Diabetes:              >6.4%  Glycemic control for   <7.0% adults with diabetes     CBG: Recent Labs  Lab 04/29/22 1630  [place] 136*    Review of Systems:   As stated in HPI  Past Medical History:  He,  has a past medical history of Herpes zoster, Hyperlipemia, Hypertension, Intestinal polyp, Prostate cancer ([place]), S/P radiation therapy (12/12/2014 through 02/06/2015                                                   ), and Seizure disorder ([place]).   Surgical History:   Past Surgical History:  Procedure Laterality Date   Carotid Artery Catherization     PROSTATE BIOPSY  06/27/2014     Social History:   reports that he has quit smoking. He has never used smokeless tobacco. He reports current alcohol use. He reports that he does not use drugs.   Family History:  His family history includes Colon cancer in his mother; Heart attack in his father; Prostate cancer in his father.   Allergies Allergies  Allergen Reactions   Oxcarbazepine     Significant and severe hyponatremia noted within a few days of starting Oxcarbazepine.   Penicillins Other (See Comments)    Patient states no allergy, but has preference that he does not want this medication     Home Medications  Prior to Admission medications    Medication Sig Start Date End Date Taking? Authorizing Provider  aspirin EC 81 MG tablet Take 81 mg by mouth daily. Swallow whole.   Yes [provider]  atorvastatin (LIPITOR) 20 MG tablet Take 20 mg by mouth daily.   Yes [provider]  Cholecalciferol (VITAMIN D3 PO) Take 1 capsule by mouth daily.   Yes [provider]  clobetasol cream (TEMOVATE) 0.92 % Apply 1 Application topically daily as needed (itching scalp).   Yes [provider]  clopidogrel (PLAVIX) 75 MG tablet Take 1 tablet (75 mg total) by mouth daily. 04/09/22  Yes Camara, Maryan Puls, MD  Cyanocobalamin (VITAMIN B-12 PO) Take 1 tablet by mouth daily.   Yes [provider]  escitalopram (LEXAPRO) 10 MG tablet Take 10 mg by mouth daily.   Yes [provider]  fluticasone (FLONASE) 50 MCG/ACT nasal spray Place 1 spray into both nostrils daily as needed for allergies. 12/10/19  Yes [provider]  folic acid (FOLVITE) 1 MG tablet Take 1 mg by mouth daily.  Yes [provider]  gabapentin (NEURONTIN) 300 MG capsule Take 300 mg by mouth at bedtime.   Yes [provider]  ketotifen (ALLERGY EYE DROPS) 0.025 % ophthalmic solution Place 1 drop into both eyes daily as needed (red eyes).   Yes [provider]  levETIRAcetam (KEPPRA) 500 MG tablet Take 1,000 mg by mouth 2 (two) times daily. 04/21/22  Yes [provider]  Naproxen Sod-diphenhydrAMINE (ALEVE PM) 220-25 MG TABS Take 2 tablets by mouth at bedtime.   Yes [provider]  pantoprazole (PROTONIX) 40 MG tablet Take 40 mg by mouth daily.   Yes [provider]  Pseudoeph-Doxylamine-DM-APAP (NYQUIL PO) Take 2 capsules by mouth at bedtime as needed (sleep when aleve pm is no working).   Yes [provider]  sodium chloride (OCEAN) 0.65 % SOLN nasal spray Place 1 spray into both nostrils daily as needed for congestion.   Yes [provider]  sodium chloride  1 g tablet Take 1 g by mouth 3 (three) times daily.   Yes [provider]  thiamine (VITAMIN B1) 100 MG tablet Take 250 mg by mouth daily. 02/16/22  Yes [provider]  triamcinolone cream (KENALOG) 0.1 % Apply 1 Application topically daily as needed (itching skin).   Yes [provider]  atorvastatin (LIPITOR) 80 MG tablet Take 1 tablet (80 mg total) by mouth daily. Patient not taking: Reported on 04/29/2022 03/25/22   Caren Griffins, MD    This patient is critically ill with multiple organ system failure; which, requires frequent high complexity decision making, assessment, support, evaluation, and titration of therapies. This was completed through the application of advanced monitoring technologies and extensive interpretation of multiple databases. During this encounter critical care time was devoted to patient care services described in this note for 31 minutes.  Garner Nash, DO [facility] 05/02/2022 10:39 AM

## 2022-05-02 NOTE — Progress Notes (Signed)
[facility] NEPHROLOGY PROGRESS NOTE  Assessment/ Plan: Pt is a 81 y.o. 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  He presented with change in mental status and generalized weakness.  On salt tablet at home for chronic hyponatremia, baseline sodium around 126-130. Urine sodium 110, osmolality 332, low uric acid level, TSH level acceptable. Started hypertonic saline on 11/15 with gradual improvement of serum sodium level.  He is off of hypertonic saline on 11/17 night.  Resume salt tablet and continue fluid restriction.  Lexapro and oxcarbazepine discontinued.  #Acute metabolic encephalopathy due to electrolytes abnormalities mainly hyponatremia: Management as above.  CT scan with brain atrophy and previous stroke.   #Hypocalcemia: Replating calcium supplement.   #History of seizure disorder: Per neurology team.  Recommend to avoid carbamazepine, Lexapro.  I will sign off, please call us back with question.  Subjective: Seen and examined in ICU.  Sodium level improved therefore 3% saline was discontinued last night around 10 PM.  He is alert awake but somnolent.  Discussed with ICU nurse.  Objective Vital signs in last 24 hours: Vitals:   05/02/22 0500 05/02/22 0600 05/02/22 0700 05/02/22 0800  BP: 138/79 117/74 102/61 121/64  Pulse: 69 66 61 60  Resp: '19 17 17 16  '$ Temp:    98.1 F (36.7 C)  TempSrc:    Axillary  SpO2: 99% 98% 99% 100%  Weight:      Height:       Weight change: 1.4 kg  Intake/Output  Summary (Last 24 hours) at 05/02/2022 0833 Last data filed at 05/02/2022 0600 Gross per 24 hour  Intake 768.14 ml  Output 275 ml  Net 493.14 ml        Labs: RENAL PANEL Recent Labs    11/22/21 1336 11/22/21 1359 02/28/22 1630 02/28/22 1701 03/09/22 1310 03/10/22 0451 03/11/22 0754 03/12/22 0313 03/13/22 0228 03/14/22 0628 03/15/22 0507 03/18/22 0300 03/19/22 0234 03/20/22 0210 03/21/22 0159 03/22/22 0300 03/23/22 0037 03/24/22 0025 04/29/22 1036 04/29/22 1059 04/29/22 1630 04/29/22 1846 04/29/22 2037 04/29/22 2248 04/30/22 0030 04/30/22 0342 04/30/22 0702 04/30/22 1107 04/30/22 1315 04/30/22 1645 04/30/22 2032 05/01/22 0103 05/01/22 0503 05/01/22 1010 05/01/22 1521 05/01/22 2120 05/02/22 0251  NA 126*   < > 125*  125*   < > 125*   < > 125* 128* 127* 128*   < > 132* 128* 129* 130* 129* 131* 132* 117* 114* 119* 118*   < > 119*   < > 120* 117* 120* 122* 121* 122* 122* 123* 124* 126* 131* 131*  K 4.3   < > 5.2*   < > 4.7   < > 3.8 3.9 3.9 4.1   < > 4.3 4.0 4.3 3.8 4.1 4.9 4.3 3.8 3.9 4.0 4.0  --  3.9  --  3.8 3.8 3.7  --   --   --   --  3.6  --   --   --  3.9  CL 91*   < > 90*   < > 90*   < > 95* 92* 91* 96*   < >  96* 94* 93* 94* 93* 93* 95* 81* 81* 85* 86*  --  87*  --  86* 86* 89*  --   --   --   --  90*  --   --   --  98  CO2 22   < > 27   < > 25   < > 21* '25 25 23   '$ < > '26 25 25 25 24 27 26 '$ 21*  --  20* 21*  --  20*  --  20* 21* 21*  --   --   --   --  23  --   --   --  24  GLUCOSE 125*   < > 100*   < > 104*   < > 153* 125* 123* 111*   < > 115* 118* 93 128* 128* 94 95 103* 100* 117* 122*  --  101*  --  110* 104* 135*  --   --   --   --  109*  --   --   --  108*  BUN 12   < > 11   < > 9   < > '13 16 14 16   '$ < > '22 22 21 18 20 22 20 '$ 6* 6* 6* 6*  --  6*  --  5* 5* 6*  --   --   --   --  <5*  --   --   --  <5*  CREATININE 0.89   < > 0.80   < > 0.95   < > 0.76 0.69 0.64 0.62   < > 0.69 0.65 0.71 0.61 0.68 0.69 0.82 0.71 0.60* 0.63 0.68  --  0.68  --  0.57* 0.63  0.68  --   --   --   --  0.67  --   --   --  0.66  CALCIUM 9.5   < > 9.3   < > 9.4   < > 8.7* 9.2 8.8* 9.1   < > 8.9 9.0 9.7 9.3 9.2 9.9 9.3 9.3  --  9.0 9.4  --  9.1  --  9.3 8.8* 9.0  --   --   --   --  9.0  --   --   --  8.8*  MG  --    < >  --    < >  --    < > 1.8 2.0 1.9 2.0  --  2.0 2.0 2.1 2.3 2.1 2.2 2.1  --   --   --   --   --   --   --  1.7  --   --   --   --   --   --  1.7  --   --   --  1.8  PHOS  --   --   --   --   --   --  2.6 2.4* 3.1 3.9  --   --   --   --   --   --   --   --   --   --   --   --   --   --   --   --   --   --   --   --   --   --   --   --   --   --   --   ALBUMIN 4.2  --  4.2  --  4.1  --   --   --   --   --   --   --   --   --   --   --   --   --  4.1  --   --   --   --   --   --   --   --   --   --   --   --   --   --   --   --   --   --    < > = values in this interval not displayed.      Liver Function Tests: Recent Labs  Lab 04/29/22 1036  AST 41  ALT 16  ALKPHOS 63  BILITOT 1.2  PROT 6.8  ALBUMIN 4.1    No results for input(s): "LIPASE", "AMYLASE" in the last 168 hours. Recent Labs  Lab 04/29/22 1530  AMMONIA 31    CBC: Recent Labs    11/23/21 0148 11/23/21 0628 04/29/22 1059 04/29/22 1550 04/30/22 0342 05/01/22 0503 05/02/22 0251  HGB  --    < > 13.6 12.5* 12.1* 11.6* 11.1*  MCV  --    < >  --  87.1 RESULTS UNAVAILABLE DUE TO INTERFERING SUBSTANCE 86.1 91.0  VITAMINB12 919*  --   --  827  --   --   --   FOLATE  --   --   --   --  37.8  --   --    < > = values in this interval not displayed.     Cardiac Enzymes: No results for input(s): "CKTOTAL", "CKMB", "CKMBINDEX", "TROPONINI" in the last 168 hours. CBG: Recent Labs  Lab 04/29/22 1630  GLUCAP 136*     Iron Studies: No results for input(s): "IRON", "TIBC", "TRANSFERRIN", "FERRITIN" in the last 72 hours. Studies/Results: DG Knee 1-2 Views Right  Result Date: 04/30/2022 CLINICAL DATA:  Right knee pain and swelling.  Fall yesterday. EXAM: RIGHT KNEE - 1-2 VIEW  COMPARISON:  None Available. FINDINGS: The bones appear mildly demineralized. No evidence of acute fracture or dislocation. The joint spaces are relatively preserved for age. No joint effusion or foreign body identified. There are scattered vascular calcifications. IMPRESSION: No evidence of acute fracture or dislocation. Vascular calcifications. Electronically Signed   By: Richardean Sale M.D.   On: 04/30/2022 14:35    Medications: Infusions:    Scheduled Medications:  aspirin EC  81 mg Oral Daily   atorvastatin  20 mg Oral Daily   Chlorhexidine Gluconate Cloth  6 each Topical Daily   clopidogrel  75 mg Oral Daily   vitamin B-12  500 mcg Oral Daily   enoxaparin (LOVENOX) injection  40 mg Subcutaneous G62I   folic acid  1 mg Oral Daily   gabapentin  300 mg Oral QHS   [START ON 06/11/2022] lamoTRIgine  100 mg Oral q morning   And   [START ON 06/11/2022] lamoTRIgine  75 mg Oral QPM   [START ON 06/18/2022] lamoTRIgine  100 mg Oral BID   lamoTRIgine  25 mg Oral Daily   Followed by   Derrill Memo ON 05/07/2022] lamoTRIgine  25 mg Oral BID   [START ON 05/14/2022] lamoTRIgine  50 mg Oral q morning   And   [START ON 05/14/2022] lamoTRIgine  25 mg Oral QPM   [START ON 05/21/2022] lamoTRIgine  50 mg Oral BID   [START ON 05/28/2022] lamoTRIgine  75 mg Oral Daily   And   [START ON 05/28/2022] lamoTRIgine  50 mg Oral QPM   [START ON 06/04/2022] lamoTRIgine  75 mg Oral BID   levETIRAcetam  1,000 mg Oral BID   Followed by   Derrill Memo ON 06/11/2022] levETIRAcetam  500  mg Oral BID   multivitamin with minerals  1 tablet Oral Daily   pantoprazole  40 mg Oral Daily   thiamine  100 mg Oral Daily   Or   thiamine  100 mg Intravenous Daily    have reviewed scheduled and prn medications.  Physical Exam: General:NAD, comfortable, somnolent Heart:RRR, s1s2 nl Lungs:clear b/l, no crackle Abdomen:soft, Non-tender, non-distended Extremities:No edema Neurology: Alert, awake but confused  Peggy Loge Tanna Furry 05/02/2022,8:33 AM  LOS: 3 days

## 2022-05-03 DIAGNOSIS — E871 Hypo-osmolality and hyponatremia: Secondary | ICD-10-CM | POA: Diagnosis not present

## 2022-05-03 LAB — BASIC METABOLIC PANEL
Anion gap: 9 (ref 5–15)
BUN: 7 mg/dL — ABNORMAL LOW (ref 8–23)
CO2: 26 mmol/L (ref 22–32)
Calcium: 8.7 mg/dL — ABNORMAL LOW (ref 8.9–10.3)
Chloride: 95 mmol/L — ABNORMAL LOW (ref 98–111)
Creatinine, Ser: 0.78 mg/dL (ref 0.61–1.24)
GFR, Estimated: >60 mL/min (ref >60)
Glucose, Bld: 91 mg/dL (ref 70–99)
Potassium: 3.8 mmol/L (ref 3.5–5.1)
Sodium: 130 mmol/L — ABNORMAL LOW (ref 135–145)

## 2022-05-03 LAB — PHOSPHORUS: Phosphorus: 3.6 mg/dL (ref 2.5–4.6)

## 2022-05-03 LAB — CBC
HCT: 30.7 % — ABNORMAL LOW (ref 39.0–52.0)
Hemoglobin: 10.6 g/dL — ABNORMAL LOW (ref 13.0–17.0)
MCH: 31.7 pg (ref 26.0–34.0)
MCHC: 34.5 g/dL (ref 30.0–36.0)
MCV: 91.9 fL (ref 80.0–100.0)
Platelets: 241 10*3/uL (ref 150–400)
RBC: 3.34 MIL/uL — ABNORMAL LOW (ref 4.22–5.81)
RDW: 12.3 % (ref 11.5–15.5)
WBC: 8.5 10*3/uL (ref 4.0–10.5)
nRBC: 0 % (ref 0.0–0.2)

## 2022-05-03 LAB — MAGNESIUM: Magnesium: 2.1 mg/dL (ref 1.7–2.4)

## 2022-05-03 MED ORDER — LAMOTRIGINE 25 MG PO TABS: ORAL_TABLET | ORAL | 0 refills | Status: DC

## 2022-05-03 MED ORDER — LAMOTRIGINE 100 MG PO TABS: 100.0000 mg | ORAL_TABLET | Freq: Two times a day (BID) | ORAL | 1 refills | Status: DC

## 2022-05-03 NOTE — TOC Transition Note (Signed)
Transition of Care [facility]) - CM/SW Discharge Note   Patient Details  Name: Carlos Wise MRN: [id number] Date of Birth: [birth year]  Transition of Care [facility]) CM/SW Contact:  Bartholomew Crews, RN Phone Number: [phone] 05/03/2022, 4:05 PM   Clinical Narrative:     Patient to transition home today. HH orders placed for resumption of care. [place] notified. No further TOC needs identified at this time.   Final next level of care: [place] Barriers to Discharge: No Barriers Identified   Patient Goals and CMS Choice        Discharge Placement                       Discharge Plan and Services                          HH Arranged: PT, OT [place] Agency: [place] Date [place]: 05/03/22   Representative spoke with at [facility] [place]: [place] ([place]) Interventions     Readmission Risk Interventions     No data to display

## 2022-05-03 NOTE — TOC Progression Note (Signed)
Transition of Care [facility]) - Progression Note    Patient Details  Name: Carlos Wise MRN: [id number] Date of Birth: 12-Dec-1940  Transition of [facility]) CM/SW Contact  Bartholomew Crews, RN Phone Number: [phone] 05/03/2022, 10:42 AM  Clinical Narrative:     Confirmed with Tommi Rumps at [place] that patient is currently active with PT, OT. Patient will need [place] orders for resumption of care. TOC following for transition needs.        Expected Discharge Plan and Services                                                 Social Determinants of Health (SDOH) Interventions    Readmission Risk Interventions     No data to display

## 2022-05-03 NOTE — Progress Notes (Signed)
Physical Therapy Treatment Patient Details Name: Carlos Wise MRN: [id number] DOB: 1941/06/14 Today's Date: 05/03/2022   History of Present Illness 81 yo male presents to [facility] on 11/15 with weakness, fall x2, AMS. Workup for hyponatremia due to possible medication intolerance, metabolic encephalopathy. 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    PT Comments    Pt tolerates treatment well but continues to demonstrate impaired balance and gait quality. Pt with tendency for posterior lean in standing and when ambulating, requiring physical assistance at all times when standing to improve safety and reduce falls risk. Pt is aware of balance deviations, but is insistent on discharging home today. PT discusses assistance requirements with [place]. Pt will benefit from continued PT services if admitted, and HHPT at the time of discharge.   Recommendations for follow up therapy are one component of a multi-disciplinary discharge planning process, led by the attending physician.  Recommendations may be updated based on patient status, additional functional criteria and insurance authorization.  Follow Up Recommendations  Home health PT     Assistance Recommended at Discharge Frequent or constant Supervision/Assistance  Patient can return home with the following A lot of help with walking and/or transfers;A lot of help with bathing/dressing/bathroom;Direct supervision/assist for medications management;Direct supervision/assist for financial management;Assistance with cooking/housework;Assist for transportation;Help with stairs or ramp for entrance   Equipment Recommendations  None recommended by PT (HCPOA [name] declines transport chair or wheelchair)    Recommendations for  Other Services       Precautions / Restrictions Precautions Precautions: Fall Precaution Comments: hx of neuropathy Restrictions Weight Bearing Restrictions: No     Mobility  Bed Mobility Overal bed mobility: Needs Assistance Bed Mobility: Supine to Sit     Supine to sit: Min assist, HOB elevated     General bed mobility comments: assist via hand hold to scoot forward    Transfers Overall transfer level: Needs assistance Equipment used: Rolling walker (2 wheels) Transfers: Sit to/from Stand Sit to Stand: Mod assist           General transfer comment: posterior lean, PT cues to increase trunk flexion and for hand placement    Ambulation/Gait Ambulation/Gait assistance: Mod assist Gait Distance (Feet): 50 Feet Assistive device: Rolling walker (2 wheels) Gait Pattern/deviations: Step-to pattern, Leaning posteriorly Gait velocity: reduced Gait velocity interpretation: <1.31 ft/sec, indicative of household ambulator   General Gait Details: pt with slowed step-to gait, pt with multiple posterior losses of balance, staggering steps at times followed by posterior lean. PT provides support at trunk via gait belt to prevent posterior falls on multiple occasions.   Stairs             Wheelchair Mobility    Modified Rankin (Stroke Patients Only)       Balance Overall balance assessment: Needs assistance Sitting-balance support: No upper extremity supported, Feet supported Sitting balance-Leahy Scale: Fair     Standing balance support: Bilateral upper extremity supported, Reliant on assistive device for balance Standing balance-Leahy Scale: Poor Standing balance comment: posterior lean, minA at all times                            Cognition Arousal/Alertness: Awake/alert Behavior During Therapy: Agitated Overall Cognitive Status: Impaired/Different from baseline Area of Impairment: Safety/judgement, Awareness  Safety/Judgement: Decreased awareness of safety Awareness: Emergent            Exercises      General Comments General comments (skin integrity, edema, etc.): VSS on RA      Pertinent Vitals/Pain Pain Assessment Pain Assessment: No/denies pain    Home Living                          Prior Function            PT Goals (current goals can now be found in the care plan section) Acute Rehab PT Goals Patient Stated Goal: to go home Progress towards PT goals: Progressing toward goals    Frequency    Min 3X/week      PT Plan Current plan remains appropriate    Co-evaluation              AM-PAC PT "6 Clicks" Mobility   Outcome Measure  Help needed turning from your back to your side while in a flat bed without using bedrails?: A Little Help needed moving from lying on your back to sitting on the side of a flat bed without using bedrails?: A Little Help needed moving to and from a bed to a chair (including a wheelchair)?: A Lot Help needed standing up from a chair using your arms (e.g., wheelchair or bedside chair)?: A Lot Help needed to walk in hospital room?: A Lot Help needed climbing 3-5 steps with a railing? : A Lot 6 Click Score: 14    End of Session Equipment Utilized During Treatment: Gait belt Activity Tolerance: Patient tolerated treatment well Patient left: in chair;with call bell/phone within reach;with chair alarm set Nurse Communication: Mobility status PT Visit Diagnosis: Other abnormalities of gait and mobility (R26.89);Muscle weakness (generalized) (M62.81)     Time: 4403-4742 PT Time Calculation (min) (ACUTE ONLY): 30 min  Charges:  $Gait Training: 8-22 mins $Therapeutic Activity: 8-22 mins                     Zenaida Niece, PT, DPT Acute Rehabilitation Office [place] 05/03/2022, 1:08 PM

## 2022-05-03 NOTE — Discharge Summary (Signed)
Physician Discharge Summary  Carlos Wise BMW:[id number] DOB: [birth year] DOA: 04/29/2022  PCP: [name], MD  Admit date: 04/29/2022 Discharge date: 05/03/2022 30 Day Unplanned Readmission Risk Score    Flowsheet Row ED to Hosp-Admission (Current) from 04/29/2022 in [facility] 76[facility] MEDICAL ICU  30 Day Unplanned Readmission Risk Score (%) 35.28 Filed at 05/03/2022 1200       This score is the patient's risk of an unplanned readmission within 30 days of being discharged (0 -100%). The score is based on dignosis, age, lab data, medications, orders, and past utilization.   Low:  0-14.9   Medium: 15-21.9   High: 22-29.9   Extreme: 30 and above          Admitted From: Home Disposition: Home  Recommendations for Outpatient Follow-up:  Follow up with PCP in 1-2 weeks Please obtain BMP/CBC in one week Follow-up with neurology in 1 month Please follow up with your PCP on the following pending results: Unresulted Labs (From admission, onward)     Start     Ordered   05/06/22 0500  Creatinine, serum  (enoxaparin (LOVENOX)    CrCl >/= 30 ml/min)  Weekly,   R     Comments: while on enoxaparin therapy    04/29/22 1304   05/03/22 1342  Lamotrigine level  Once,   R       Question:  Specimen collection method  Answer:  Lab=Lab collect   05/03/22 1341   05/01/22 2725  Basic metabolic panel  Daily at 5am,   R     Question:  Specimen collection method  Answer:  Lab=Lab collect   04/30/22 1556   04/30/22 0500  CBC  Daily at 5am,   R      04/29/22 1301   04/30/22 0500  Magnesium  Daily at 5am,   R      04/29/22 1301   04/29/22 1307  Urinalysis, Routine w reflex microscopic  Once,   R        04/29/22 1306   04/29/22 1216  Sodium, urine, random  ONCE - URGENT,   URGENT        04/29/22 1215   04/29/22 1216  Osmolality, urine  Once,   URGENT        04/29/22 1215              Home Health: Yes Equipment/Devices: None  Discharge Condition: Stable CODE STATUS:  Full code Diet recommendation: Cardiac  Subjective: Seen and examined.  He has no complaints.  He is eager to go home.  Brief/Interim Summary: Carlos Wise is a 81 y.o. M with PMH HTN, HL, prostate Ca, chronic hyponatremia, seizure disorder who presented with 2 days of generalized weakness and fall from standing at home.  He has a caregiver who gives a history of chronic hyponatremia in the 120's.  He also drinks 8 oz gin and tonic daily.   He was recently admitted in October at [facility] with encephalopathy and left corona radiata acute lacunar infarction.  His Keppra was increased at that time, however it was felt that this was making him agitated and so started on Trileptal in addition to [place].  His caregiver reports starting this two days before the onset  weakness and nausea.  He was not confused, but was low to answer questions.   On arrival to the ED he was hemodynamically stable, head CT negative and initial sodium 117.  He was seen by neurology, concern for oxcarbazepine  induced hyponatremia and recommended hypertonic saline, so PCCM consulted for ICU admission and eventually was transferred under [facility] on 05/03/2022 and I saw him for the first time today, he appears stable, sodium 130 today which is at his baseline, he is being discharged in stable condition.   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 Hx hyponatremia with baseline sodium 127-130.  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  Sodium 117 ->114 (11 am on 11/15). Gradually improved to 130 this AM.  He is alert and oriented, mental status at baseline.  Patient received hypertonic saline during this hospitalization which was stopped on the November 17.  He was started on salt tablets and fluid restriction.  Neurology has recommended discontinuing oxcarbazepine as  well as Lexapro.  Patient was assessed by PT OT 3 days ago and they recommended home health.  When walking with the nurses today, he was felt to be unsteady so PT was reconsulted and once again they recommended home health with PT.  Caretaker is comfortable taking her home today.   History of Seizures  Oxcarbazepine discontinued by neurology.  Patient on Keppra bridged to therapeutic Lamictal level for next 8 weeks.  He will have to follow-up with PCP closely as well as neurology.  Patient and caretaker has been informed.    ETOH use  Reports drinking 8 oz gin and tonic daily.  No signs of withdrawal.   Hx CVA  HTN Plan: Continue aspirin statin As needed hydralazine and metoprolol    GERD Continue PPI   Peripheral Neuropathy -Continue home gabapentin  Discharge plan was discussed with patient and/or family member and they verbalized understanding and agreed with it.  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   Penicillins Other (See Comments)   Patient states no allergy, but has preference that he does not want this medication        Medication List     STOP taking these medications    Aleve PM 220-25 MG Tabs Generic drug: Naproxen Sod-diphenhydrAMINE   escitalopram 10 MG tablet Commonly known as: LEXAPRO       TAKE these medications    Allergy Eye Drops  0.025 % ophthalmic solution Generic drug: ketotifen Place 1 drop into both eyes daily as needed (red eyes).   aspirin EC 81 MG tablet Take 81 mg by mouth daily. Swallow whole.   atorvastatin 20 MG tablet Commonly known as: LIPITOR Take 20 mg by mouth daily. What changed: Another medication with the same name was removed. Continue taking this medication, and follow the directions you see here.   clobetasol cream 0.05 % Commonly known as: TEMOVATE Apply 1 Application topically daily as needed (itching scalp).   clopidogrel 75 MG tablet Commonly known as: PLAVIX Take 1  tablet (75 mg total) by mouth daily.   fluticasone 50 MCG/ACT nasal spray Commonly known as: FLONASE Place 1 spray into both nostrils daily as needed for allergies.   folic acid 1 MG tablet Commonly known as: FOLVITE Take 1 mg by mouth daily.   gabapentin 300 MG capsule Commonly known as: NEURONTIN Take 300 mg by mouth at bedtime.   lamoTRIgine 25 MG tablet Commonly known as: LaMICtal Take as directed per neurology dose increase schedule. Then on 06/25/2022, continue '100mg'$  twice daily   lamoTRIgine 100 MG tablet Commonly known as: LaMICtal Take 1 tablet (100 mg total) by mouth 2 (two) times daily. Start taking on: June 25, 2022   levETIRAcetam 500 MG tablet Commonly known as: KEPPRA Take 1,000 mg by mouth 2 (two) times daily.   NYQUIL PO Take 2 capsules by mouth at bedtime as needed (sleep when aleve pm is no working).   pantoprazole 40 MG tablet Commonly known as: PROTONIX Take 40 mg by mouth daily.   sodium chloride 0.65 % Soln nasal spray Commonly known as: OCEAN Place 1 spray into both nostrils daily as needed for congestion.   sodium chloride 1 g tablet Take 1 g by mouth 3 (three) times daily.   thiamine 100 MG tablet Commonly known as: VITAMIN B1 Take 250 mg by mouth daily.   triamcinolone cream 0.1 % Commonly known as: KENALOG Apply 1 Application topically daily as needed (itching  skin).   VITAMIN B-12 PO Take 1 tablet by mouth daily.   VITAMIN D3 PO Take 1 capsule by mouth daily.        Follow-up Information     Care, [facility] Follow up.   Specialty: Home Health Services Why: Alvis Lemmings will call to schedule home health visits Contact information: [address] [place] [address] [phone]         Hamrick, Lorin Mercy, MD Follow up in 1 week(s).   Specialty: Family Medicine Contact information: [place] [address] [phone]         [facility] Follow up in 1 month(s).   Contact information: [address]     [address] [place] [address] [phone]               Allergies  Allergen Reactions   Oxcarbazepine     Significant and severe hyponatremia noted within a few days of starting Oxcarbazepine.   Penicillins Other (See Comments)    Patient states no allergy, but has preference that he does not want this medication    Consultations: Neurology and nephrology   Procedures/Studies: DG Knee 1-2 Views Right  Result Date: 04/30/2022 CLINICAL DATA:  Right knee pain and swelling.  Fall yesterday. EXAM: RIGHT KNEE - 1-2 VIEW COMPARISON:  None Available. FINDINGS: The bones appear mildly demineralized. No evidence of acute fracture or dislocation. The joint spaces are relatively preserved for age. No joint effusion or foreign body identified. There are scattered vascular calcifications. IMPRESSION: No evidence of acute fracture or dislocation. Vascular calcifications. Electronically Signed   By: Richardean Sale M.D.   On: 04/30/2022 14:35   CT HEAD CODE STROKE WO CONTRAST  Result Date: 04/29/2022 CLINICAL DATA:  Code stroke. Acute neuro deficit. Altered mental status, weakness EXAM: CT HEAD WITHOUT CONTRAST TECHNIQUE: Contiguous axial images were obtained from the base of the skull through the vertex without intravenous contrast. RADIATION DOSE  REDUCTION: This exam was performed according to the departmental dose-optimization program which  includes automated exposure control, adjustment of the mA and/or kV according to patient size and/or use of iterative reconstruction technique. COMPARISON:  CT head 03/09/2022 FINDINGS: Brain: Generalized atrophy and ventricular enlargement, stable. Ventricular enlargement consistent with atrophy. Moderate white matter hypodensity bilaterally is symmetric and unchanged. Negative for acute infarct, hemorrhage, mass Vascular: Negative for hyperdense vessel. 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 Skull: Negative Sinuses/Orbits: Mild mucosal edema paranasal sinuses. Negative orbit Other: None ASPECTS ([place] Stroke Program Early CT Score) - Ganglionic level infarction (caudate, lentiform nuclei, internal capsule, insula, M1-M3 cortex): 7 - Supraganglionic infarction (M4-M6 cortex): 3 Total score (0-10 with 10 being normal): 10 IMPRESSION: 1. No acute abnormality. Atrophy and chronic microvascular ischemia. 2. Aspects is 10. 3. Code stroke imaging results were communicated on 04/29/2022 at 10:59 am to provider [place] via Amion app Electronically Signed   By: Franchot Gallo M.D.   On: 04/29/2022 10:59     Discharge Exam: Vitals:   05/03/22 1300 05/03/22 1400  BP: 124/68   Pulse: 76 72  Resp: 16 19  Temp:    SpO2: 96% 98%   Vitals:   05/03/22 1100 05/03/22 1200 05/03/22 1300 05/03/22 1400  BP: (!) 143/82 125/73 124/68   Pulse: 67 74 76 72  Resp: '17 16 16 19  '$ Temp:      TempSrc:      SpO2: 94% 97% 96% 98%  Weight:      Height:        General: Pt is alert, awake, not in acute distress Cardiovascular: RRR, S1/S2 +, no rubs, no gallops Respiratory: CTA bilaterally, no wheezing, no rhonchi Abdominal: Soft, NT, ND, bowel sounds + Extremities: no edema, no cyanosis    The results of significant diagnostics from this hospitalization (including imaging, microbiology, ancillary and  laboratory) are listed below for reference.     Microbiology: Recent Results (from the past 240 hour(s))  MRSA Next Gen by PCR, Nasal     Status: None   Collection Time: 04/29/22  4:23 PM   Specimen: Nasal Mucosa; Nasal Swab  Result Value Ref Range Status   MRSA by PCR Next Gen NOT DETECTED NOT DETECTED Final    Comment: (NOTE) The GeneXpert MRSA Assay (FDA approved for NASAL specimens only), is one component of a comprehensive MRSA colonization surveillance program. It is not intended to diagnose MRSA infection nor to guide or monitor treatment for MRSA infections. Test performance is not FDA approved in patients less than 76 years old. Performed at [facility], [place] [address], [place], [place] [address]      Labs: BNP (last 3 results) No results for input(s): "BNP" in the last 8760 hours. Basic Metabolic Panel: Recent Labs  Lab 04/30/22 0342 04/30/22 0702 04/30/22 1107 04/30/22 1315 05/01/22 0503 05/01/22 1010 05/01/22 1521 05/01/22 2120 05/02/22 0251 05/03/22 0056  NA 120* 117* 120*   < > 123* 124* 126* 131* 131* 130*  K 3.8 3.8 3.7  --  3.6  --   --   --  3.9 3.8  CL 86* 86* 89*  --  90*  --   --   --  98 95*  CO2 20* 21* 21*  --  23  --   --   --  24 26  GLUCOSE 110* 104* 135*  --  109*  --   --   --  108* 91  BUN 5* 5* 6*  --  <5*  --   --   --  <  5* 7*  CREATININE 0.57* 0.63 0.68  --  0.67  --   --   --  0.66 0.78  CALCIUM 9.3 8.8* 9.0  --  9.0  --   --   --  8.8* 8.7*  MG 1.7  --   --   --  1.7  --   --   --  1.8 2.1  PHOS  --   --   --   --   --   --   --   --   --  3.6   < > = values in this interval not displayed.   Liver Function Tests: Recent Labs  Lab 04/29/22 1036  AST 41  ALT 16  ALKPHOS 63  BILITOT 1.2  PROT 6.8  ALBUMIN 4.1   No results for input(s): "LIPASE", "AMYLASE" in the last 168 hours. Recent Labs  Lab 04/29/22 1530  AMMONIA 31   CBC: Recent Labs  Lab 04/29/22 1036 04/29/22 1059 04/29/22 1550 04/30/22 0342  05/01/22 0503 05/02/22 0251 05/03/22 0056  WBC 10.0  --  9.7 10.2 8.8 8.7 8.5  NEUTROABS 8.0*  --   --   --   --   --   --   HGB 13.0   < > 12.5* 12.1* 11.6* 11.1* 10.6*  HCT 34.7*   < > 33.8* RESULTS UNAVAILABLE DUE TO INTERFERING SUBSTANCE 31.0* 31.4* 30.7*  MCV 85.7  --  87.1 RESULTS UNAVAILABLE DUE TO INTERFERING SUBSTANCE 86.1 91.0 91.9  PLT 252  --  246 243 231 255 241   < > = values in this interval not displayed.   Cardiac Enzymes: No results for input(s): "CKTOTAL", "CKMB", "CKMBINDEX", "TROPONINI" in the last 168 hours. BNP: Invalid input(s): "POCBNP" CBG: Recent Labs  Lab 04/29/22 1630  GLUCAP 136*   D-Dimer No results for input(s): "DDIMER" in the last 72 hours. Hgb A1c No results for input(s): "HGBA1C" in the last 72 hours. Lipid Profile No results for input(s): "CHOL", "HDL", "LDLCALC", "TRIG", "CHOLHDL", "LDLDIRECT" in the last 72 hours. Thyroid function studies No results for input(s): "TSH", "T4TOTAL", "T3FREE", "THYROIDAB" in the last 72 hours.  Invalid input(s): "FREET3" Anemia work up No results for input(s): "VITAMINB12", "FOLATE", "FERRITIN", "TIBC", "IRON", "RETICCTPCT" in the last 72 hours. Urinalysis    Component Value Date/Time   COLORURINE STRAW (A) 04/29/2022 2027   APPEARANCEUR CLEAR 04/29/2022 2027   LABSPEC 1.008 04/29/2022 2027   PHURINE 7.0 04/29/2022 2027   GLUCOSEU NEGATIVE 04/29/2022 2027   HGBUR MODERATE (A) 04/29/2022 2027   BILIRUBINUR NEGATIVE 04/29/2022 2027   KETONESUR 5 (A) 04/29/2022 2027   PROTEINUR NEGATIVE 04/29/2022 2027   NITRITE NEGATIVE 04/29/2022 2027   LEUKOCYTESUR NEGATIVE 04/29/2022 2027   Sepsis Labs Recent Labs  Lab 04/30/22 0342 05/01/22 0503 05/02/22 0251 05/03/22 0056  WBC 10.2 8.8 8.7 8.5   Microbiology Recent Results (from the past 240 hour(s))  MRSA Next Gen by PCR, Nasal     Status: None   Collection Time: 04/29/22  4:23 PM   Specimen: Nasal Mucosa; Nasal Swab  Result Value Ref Range Status    MRSA by PCR Next Gen NOT DETECTED NOT DETECTED Final    Comment: (NOTE) The GeneXpert MRSA Assay (FDA approved for NASAL specimens only), is one component of a comprehensive MRSA colonization surveillance program. It is not intended to diagnose MRSA infection nor to guide or monitor treatment for MRSA infections. Test performance is not FDA approved in patients less than 17 years old.  Performed at [facility], [place] [address], [place], [place] [address]      Time coordinating discharge: Over 30 minutes  SIGNED:   Darliss Cheney, MD  [facility] 05/03/2022, 3:13 PM *Please note that this is a verbal dictation therefore any spelling or grammatical errors are due to the "[place] [facility]" system interpretation. If 7PM-7AM, please contact night-coverage [email or web address]

## 2022-05-03 NOTE — Plan of Care (Signed)

## 2022-05-04 NOTE — Telephone Encounter (Signed)
I called Carlos Wise back. Pt d/c from the hospital last night and med adjustments were made.   I have scheduled f/u for 06/25/2022 at 315 with Dr. April Manson will discuss meds at the time of the follow up.

## 2022-05-05 ENCOUNTER — Other Ambulatory Visit: Payer: Self-pay

## 2022-05-05 DIAGNOSIS — I6329 Cerebral infarction due to unspecified occlusion or stenosis of other precerebral arteries: Secondary | ICD-10-CM

## 2022-05-05 DIAGNOSIS — C61 Malignant neoplasm of prostate: Secondary | ICD-10-CM

## 2022-05-05 DIAGNOSIS — I509 Heart failure, unspecified: Secondary | ICD-10-CM

## 2022-05-05 LAB — LAMOTRIGINE LEVEL: Lamotrigine Lvl: 1.2 ug/mL — ABNORMAL LOW (ref 2.0–20.0)

## 2022-05-05 NOTE — Patient Outreach (Signed)
[facility] [ACO] Patient: Government social research officer  Primary Care Provider: [name], MD, [facility], [place]  Patient evaluated for community based chronic complex disease management services with [place] [facility] as a benefit of patient's [employer]. Spoke with [name] via phone number provided,  HIPAA verified with patient's name, date of birth and house address verified to explain [place] Management services.  Explained that the patient qualifies for complex disease management and care coordination.She states, "yes that would be good to have a nurse if it's covered by his insurance."  Explained that for care coordination there is no fee with [facility] CM team.  She states PT from [facility] coming out today and has a follow up appointment with Dr. Lisbeth Ply on Monday. No SDOH needs noted.   Patient will receive post hospital discharge call and will be evaluated ongoing care coordination support for readmission prevention needs.      Of note, [facility] Care Management services does not replace or interfere with any services that are arranged by inpatient case management or social work.  For additional questions or referrals please contact:    Natividad Brood, RN BSN [place]  [phone] business mobile phone Toll free office [phone]  *[place]  [phone] Fax number: [phone] [email or web address]

## 2022-05-06 ENCOUNTER — Telehealth: Payer: Self-pay | Admitting: *Deleted

## 2022-05-06 NOTE — Progress Notes (Signed)
  Care Coordination   Note   05/06/2022 Name: Carlos Wise MRN: [id number] DOB: 11/28/1940  Carlos Wise is a 81 y.o. year old male who sees Hamrick, Lorin Mercy, MD for primary care. I reached out to [name] by phone today to offer care coordination services.  Carlos Wise was given information about Care Coordination services today including:   The Care Coordination services include support from the care team which includes your Nurse Coordinator, Clinical Social Worker, or Pharmacist.  The Care Coordination team is here to help remove barriers to the health concerns and goals most important to you. Care Coordination services are voluntary, and the patient may decline or stop services at any time by request to their care team member.   Care Coordination Consent Status: Patient agreed to services and verbal consent obtained.   Follow up plan:  Telephone appointment with care coordination team member scheduled for:  05/11/2022  Encounter Outcome:  Pt. Scheduled from referral   Julian [place], [place] Direct Dial: [phone]

## 2022-05-11 ENCOUNTER — Ambulatory Visit: Payer: Self-pay

## 2022-05-11 DIAGNOSIS — E871 Hypo-osmolality and hyponatremia: Secondary | ICD-10-CM | POA: Diagnosis not present

## 2022-05-11 DIAGNOSIS — D649 Anemia, unspecified: Secondary | ICD-10-CM | POA: Diagnosis not present

## 2022-05-11 DIAGNOSIS — Z8673 Personal history of transient ischemic attack (TIA), and cerebral infarction without residual deficits: Secondary | ICD-10-CM | POA: Diagnosis not present

## 2022-05-11 DIAGNOSIS — G40909 Epilepsy, unspecified, not intractable, without status epilepticus: Secondary | ICD-10-CM | POA: Diagnosis not present

## 2022-05-11 DIAGNOSIS — Z7689 Persons encountering health services in other specified circumstances: Secondary | ICD-10-CM | POA: Diagnosis not present

## 2022-05-11 DIAGNOSIS — Z79899 Other long term (current) drug therapy: Secondary | ICD-10-CM | POA: Diagnosis not present

## 2022-05-11 NOTE — Patient Outreach (Signed)
  Care Coordination   Initial Visit Note   05/11/2022 Name: Carlos Wise MRN: [id number] DOB: Jan 13, 1941  Carlos Wise is a 81 y.o. year old male who sees Hamrick, Lorin Mercy, MD for primary care. I spoke with  Carlos Wise by phone today. Placed call to patient and received verbal consent to speak with Carlos Wise HCPOA.   What matters to the patients health and wellness today?  According to Ms. Carlos Wise, patient is doing well. Reports her only concern is about patient getting PT more than once a week. Patient is seeing MD today and I recommended she speak with PCP and encourage an order for more than once a week.  No other needs identified.  Encouraged caregiver to call me if needed in the future.     SDOH assessments and interventions completed:  Yes  SDOH Interventions Today    Flowsheet Row Most Recent Value  SDOH Interventions   Food Insecurity Interventions Intervention Not Indicated  Housing Interventions Intervention Not Indicated  Transportation Interventions Intervention Not Indicated  Utilities Interventions Intervention Not Indicated        Care Coordination Interventions:  Yes, provided   Follow up plan: No further intervention required.   Encounter Outcome:  Pt. Visit Completed   Carlos Rand, RN, BSN, CEN [place] Coordinator [phone]

## 2022-05-20 DIAGNOSIS — Z7902 Long term (current) use of antithrombotics/antiplatelets: Secondary | ICD-10-CM | POA: Diagnosis not present

## 2022-05-20 DIAGNOSIS — G40909 Epilepsy, unspecified, not intractable, without status epilepticus: Secondary | ICD-10-CM | POA: Diagnosis not present

## 2022-05-20 DIAGNOSIS — C61 Malignant neoplasm of prostate: Secondary | ICD-10-CM | POA: Diagnosis not present

## 2022-05-20 DIAGNOSIS — F17200 Nicotine dependence, unspecified, uncomplicated: Secondary | ICD-10-CM | POA: Diagnosis not present

## 2022-05-20 DIAGNOSIS — I951 Orthostatic hypotension: Secondary | ICD-10-CM | POA: Diagnosis not present

## 2022-05-20 DIAGNOSIS — K219 Gastro-esophageal reflux disease without esophagitis: Secondary | ICD-10-CM | POA: Diagnosis not present

## 2022-05-20 DIAGNOSIS — I69318 Other symptoms and signs involving cognitive functions following cerebral infarction: Secondary | ICD-10-CM | POA: Diagnosis not present

## 2022-05-20 DIAGNOSIS — I10 Essential (primary) hypertension: Secondary | ICD-10-CM | POA: Diagnosis not present

## 2022-05-20 DIAGNOSIS — E785 Hyperlipidemia, unspecified: Secondary | ICD-10-CM | POA: Diagnosis not present

## 2022-05-20 DIAGNOSIS — Z880 Allergy status to penicillin: Secondary | ICD-10-CM | POA: Diagnosis not present

## 2022-05-20 DIAGNOSIS — I2510 Atherosclerotic heart disease of native coronary artery without angina pectoris: Secondary | ICD-10-CM | POA: Diagnosis not present

## 2022-05-20 DIAGNOSIS — Q079 Congenital malformation of nervous system, unspecified: Secondary | ICD-10-CM | POA: Diagnosis not present

## 2022-05-25 ENCOUNTER — Other Ambulatory Visit: Payer: Self-pay | Admitting: Neurology

## 2022-06-04 ENCOUNTER — Other Ambulatory Visit (HOSPITAL_COMMUNITY): Payer: Self-pay

## 2022-06-11 DIAGNOSIS — E871 Hypo-osmolality and hyponatremia: Secondary | ICD-10-CM | POA: Diagnosis not present

## 2022-06-11 DIAGNOSIS — D649 Anemia, unspecified: Secondary | ICD-10-CM | POA: Diagnosis not present

## 2022-06-15 ENCOUNTER — Other Ambulatory Visit: Payer: Self-pay | Admitting: Neurology

## 2022-06-25 ENCOUNTER — Encounter: Payer: Self-pay | Admitting: Neurology

## 2022-06-25 ENCOUNTER — Ambulatory Visit: Payer: Medicare HMO | Admitting: Neurology

## 2022-06-25 VITALS — BP 154/94 | HR 87 | Ht 67.0 in | Wt 166.8 lb

## 2022-06-25 DIAGNOSIS — G40909 Epilepsy, unspecified, not intractable, without status epilepticus: Secondary | ICD-10-CM

## 2022-06-25 DIAGNOSIS — I6329 Cerebral infarction due to unspecified occlusion or stenosis of other precerebral arteries: Secondary | ICD-10-CM | POA: Diagnosis not present

## 2022-06-25 DIAGNOSIS — Z5181 Encounter for therapeutic drug level monitoring: Secondary | ICD-10-CM | POA: Diagnosis not present

## 2022-06-25 MED ORDER — LAMOTRIGINE 100 MG PO TABS: 100.0000 mg | ORAL_TABLET | Freq: Two times a day (BID) | ORAL | 11 refills | Status: DC

## 2022-06-25 NOTE — Progress Notes (Signed)
[facility]  PATIENT: Carlos Wise DOB: [birth year]  REQUESTING CLINICIAN: Hamrick, Lorin Mercy, MD HISTORY FROM: Patient, caregiver and chart  REASON FOR VISIT: Hospital follow up   HISTORICAL  CHIEF COMPLAINT:  Chief Complaint  Patient presents with   Hospitalization Follow-up    Rm 20. Accompanied by Asher Muir. Hospital F/U. Discuss antidepressant medication.   INTERVAL HISTORY 06/25/2022 Patient presents today for follow-up, he is accompanied by caregiver.  At last visit in October plan was to switch Keppra to Briviact due to irritability and mood swings.  Briviact was approved but the cost was high for patient.  At that point, I did try him on Trileptal.  He did however develop hyponatremia and presented to the hospital due to weakness.  At that time his Trileptal was discontinued and patient was started on Lamictal.  Currently he is doing well on Lamictal 100 mg twice daily.  Denies any seizures.  He reported he is still have temper issues and irritability and is PCP started him on Buspirone. Again, no seizures.     HISTORY OF PRESENT ILLNESS:  This is a 82 year old gentleman with past medical history of seizure disorder, hypertension, hyperlipidemia, alcoholism who is presenting after recent hospitalization for altered mental status, subdural hemorrhage and new lacunar stroke.  Patient was found down at home, he was initially very confused.  In the ED he was noted to have a subdural bleed, neurosurgery evaluated not a candidate for surgery.  It is unclear what caused the initial confusion as patient was found also to have a new stroke.  His EEG showed diffuse slowing consistent with generalized encephalopathy.  His Keppra was increased from 1000 mg daily to 1000 mg twice daily.  He was continued on aspirin.  Since being home caregiver has noted increased irritability and anger.  He was not irritable when he was on Keppra at 1000 mg daily He still lives alone,  somewhat independent still uses alcohol and denies any new seizure or seizure-like activity   Hospital course and summary  82 year old man with hx of alcoholism, tobacco abuse, prior seizures presenting after being found down with seizure activity per daughter.  Found to have small subdural bleed.  Remains confused.  Similar admits w/ postictal states to other hospitals. NSGY evaluated and reviewed scans, no further workup needed. Acute encephalopathy-likely multifactorial including seizure/post ictal, postconcussion, acute stroke, subdural hematoma, alcohol withdrawal, phenobarbital effect (he received phenobarb in the ICU for alcohol withdrawal) -neurology consulted and followed patient while hospitalized.  EEG LTM-no seizure, moderate diffuse encephalopathy/nonspecific secondary to underlying structural abnormality.  Neurology had recommended Keppra, and it appears that his dose has been increased to 1000 mg twice daily Adult failure to thrive -s/p Repeat SS eval 10/3= D3 diet with full supervision.  Patient initially had a core track, but was adamant to remove it.  He does not want any form of advanced feeding tube.  Now tolerating oral diet.  He is doing well with physical therapy, will be discharged home with home health. Caregiver [name] is arranging for 24/7 home care which will be available starting the day of discharge. Small acute infarcts in the left pons and left corona radiata -Likely due to small vessel disease.  MRI confirmed CVA.  Echocardiogram EF 60%, LDL 161, A1c 5.1.  Continue aspirin, Lipitor 80 mg Hyponatremia-stable.  Alcohol abuse -initially requiring phenobarbital in the ICU.  No longer withdrawing  OTHER MEDICAL CONDITIONS: Seizure disorder, alcoholism, Hypertension, hyperlipidemia,  REVIEW OF SYSTEMS: Full 14 system review of systems performed and negative with exception of: As noted in the HPI   ALLERGIES: Allergies  Allergen Reactions   Oxcarbazepine      Significant and severe hyponatremia noted within a few days of starting Oxcarbazepine.   Penicillins Other (See Comments)    Patient states no allergy, but has preference that he does not want this medication    HOME MEDICATIONS: Outpatient Medications Prior to Visit  Medication Sig Dispense Refill   aspirin EC 81 MG tablet Take 81 mg by mouth daily. Swallow whole.     atorvastatin (LIPITOR) 20 MG tablet Take 10 mg by mouth daily.     busPIRone (BUSPAR) 15 MG tablet Take 15 mg by mouth 2 (two) times daily.     Cholecalciferol (VITAMIN D3 PO) Take 1 capsule by mouth daily.     clobetasol cream (TEMOVATE) 3.15 % Apply 1 Application topically daily as needed (itching scalp).     clopidogrel (PLAVIX) 75 MG tablet Take 1 tablet (75 mg total) by mouth daily. 90 tablet 3   Cyanocobalamin (VITAMIN B-12 PO) Take 1 tablet by mouth daily.     fluticasone (FLONASE) 50 MCG/ACT nasal spray Place 1 spray into both nostrils daily as needed for allergies.     folic acid (FOLVITE) 1 MG tablet Take 1 mg by mouth daily.     gabapentin (NEURONTIN) 300 MG capsule Take 300 mg by mouth at bedtime.     ketotifen (ALLERGY EYE DROPS) 0.025 % ophthalmic solution Place 1 drop into both eyes daily as needed (red eyes).     pantoprazole (PROTONIX) 40 MG tablet Take 40 mg by mouth daily.     Pseudoeph-Doxylamine-DM-APAP (NYQUIL PO) Take 2 capsules by mouth at bedtime as needed (sleep when aleve pm is no working).     sodium chloride (OCEAN) 0.65 % SOLN nasal spray Place 1 spray into both nostrils daily as needed for congestion.     sodium chloride 1 g tablet Take 1 g by mouth 3 (three) times daily.     tamsulosin (FLOMAX) 0.4 MG CAPS capsule Take 0.4 mg by mouth.     thiamine (VITAMIN B1) 100 MG tablet Take 250 mg by mouth daily.     triamcinolone cream (KENALOG) 0.1 % Apply 1 Application topically daily as needed (itching skin).     lamoTRIgine (LAMICTAL) 100 MG tablet Take 1 tablet (100 mg total) by mouth 2 (two) times  daily. 60 tablet 1   lamoTRIgine (LAMICTAL) 25 MG tablet Take as directed per neurology dose increase schedule. Then on 06/25/2022, continue '100mg'$  twice daily 252 tablet 0   levETIRAcetam (KEPPRA) 500 MG tablet Take 1,000 mg by mouth 2 (two) times daily. (Patient not taking: Reported on 06/25/2022)     No facility-administered medications prior to visit.    PAST MEDICAL HISTORY: Past Medical History:  Diagnosis Date   Herpes zoster    Hyperlipemia    Hypertension    Intestinal polyp    hyperplastic   Prostate cancer ([place])    S/P radiation therapy 12/12/2014 through 02/06/2015                                                      Prostate 7800 cGy in 40 sessions, seminal vesicles 5600 cGy in 40 sessions  Seizure disorder [facility])     PAST SURGICAL HISTORY: Past Surgical History:  Procedure Laterality Date   Carotid Artery Catherization     PROSTATE BIOPSY  06/27/2014    FAMILY HISTORY: Family History  Problem Relation Age of Onset   Colon cancer Mother    Prostate cancer Father    Heart attack Father     SOCIAL HISTORY: Social History   Socioeconomic History   Marital status: Widowed    Spouse name: Not on file   Number of children: 0   Years of education: Not on file   Highest education level: Not on file  Occupational History   Occupation: Retired  Tobacco Use   Smoking status: Former   Smokeless tobacco: Never   Tobacco comments:    Quit 30 years ago  Substance and Sexual Activity   Alcohol use: Yes    Alcohol/week: 0.0 standard drinks of alcohol    Comment: 3 oz daily   Drug use: No   Sexual activity: Not on file  Other Topics Concern   Not on file  Social History Narrative   Not on file   Social Determinants of Health   Financial Resource Strain: Not on file  Food Insecurity: No Food Insecurity (05/11/2022)   Hunger Vital Sign    Worried About Running Out of Food in the Last Year: Never true    Ran Out of Food in the Last  Year: Never true  Transportation Needs: No Transportation Needs (05/11/2022)   PRAPARE - Hydrologist (Medical): No    Lack of Transportation (Non-Medical): No  Physical Activity: Not on file  Stress: Not on file  Social Connections: Not on file  Intimate Partner Violence: Not At Risk (04/29/2022)   Humiliation, Afraid, Rape, and Kick questionnaire    Fear of Current or Ex-Partner: No    Emotionally Abused: No    Physically Abused: No    Sexually Abused: No    PHYSICAL EXAM  GENERAL EXAM/CONSTITUTIONAL: Vitals:  Vitals:   06/25/22 1505  BP: (!) 154/94  Pulse: 87  Weight: 166 lb 12.8 oz (75.7 kg)  Height: '5\' 7"'$  (1.702 m)   Body mass index is 26.12 kg/m. Wt Readings from Last 3 Encounters:  06/25/22 166 lb 12.8 oz (75.7 kg)  05/03/22 157 lb 10.1 oz (71.5 kg)  04/09/22 168 lb (76.2 kg)   Patient is in no distress; well developed, nourished and groomed; neck is supple  EYES: Visual fields full to confrontation, Extraocular movements intacts,   MUSCULOSKELETAL: Gait, strength, tone, movements noted in Neurologic exam below  NEUROLOGIC: MENTAL STATUS:      No data to display         awake, alert, oriented to person, place and time with psychomotor retardation  recent and remote memory intact normal attention and concentration language fluent, comprehension intact, naming intact fund of knowledge appropriate  CRANIAL NERVE:  2nd, 3rd, 4th, 6th - visual fields full to confrontation, extraocular muscles intact, no nystagmus 5th - facial sensation symmetric 7th - facial strength symmetric 8th - hearing intact 9th - palate elevates symmetrically, uvula midline 11th - shoulder shrug symmetric 12th - tongue protrusion midline  MOTOR:  normal bulk and tone, at least antigravity in the BUE and BLE  SENSORY:  normal and symmetric to light touch  COORDINATION:  finger-nose-finger, fine finger movements normal  GAIT/STATION:   Ambulates with a walker     DIAGNOSTIC DATA (LABS, IMAGING, TESTING) - I reviewed  patient records, labs, notes, testing and imaging myself where available.  Lab Results  Component Value Date   WBC 8.5 05/03/2022   HGB 10.6 (L) 05/03/2022   HCT 30.7 (L) 05/03/2022   MCV 91.9 05/03/2022   PLT 241 05/03/2022      Component Value Date/Time   NA 128 (L) 06/25/2022 1537   K 5.0 06/25/2022 1537   CL 90 (L) 06/25/2022 1537   CO2 24 06/25/2022 1537   GLUCOSE 89 06/25/2022 1537   GLUCOSE 91 05/03/2022 0056   BUN 9 06/25/2022 1537   CREATININE 0.84 06/25/2022 1537   CALCIUM 9.7 06/25/2022 1537   PROT 6.8 04/29/2022 1036   ALBUMIN 4.1 04/29/2022 1036   AST 41 04/29/2022 1036   ALT 16 04/29/2022 1036   ALKPHOS 63 04/29/2022 1036   BILITOT 1.2 04/29/2022 1036   GFRNONAA >60 05/03/2022 0056   GFRAA >60 02/08/2020 0408   Lab Results  Component Value Date   CHOL 229 (H) 03/02/2022   HDL 48 03/02/2022   LDLCALC 161 (H) 03/02/2022   TRIG 98 03/02/2022   CHOLHDL 4.8 03/02/2022   Lab Results  Component Value Date   HGBA1C 5.1 03/02/2022   Lab Results  Component Value Date   QASTMHDQ22 297 04/29/2022   Lab Results  Component Value Date   TSH 1.477 04/29/2022    MRI Brain 03/01/22 1. 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 No associated hemorrhage or mass effect. 2. Otherwise stable noncontrast MRI appearance of the brain since June with moderately advanced signal changes in the white matter and pons suspected due to small vessel disease.  CT Head, CTA Head and Neck 03/01/2022 1. CT head negative for acute hemorrhage. 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 Small acute infarct left centrum semiovale best seen on MRI. 2. Negative for intracranial large vessel occlusion. 3. 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 No residual aneurysm. 4. Atherosclerotic disease in the right carotid bifurcation with 50% diameter  stenosis distal common carotid artery. Right internal carotid artery diffusely disease without significant stenosis 5. Atherosclerotic disease left carotid bifurcation without significant stenosis 6. No significant vertebral artery stenosis.  EEG 03/10/22 - Continuous slow, generalized and lateralized left hemisphere - Continuous slow, right temporal region  ASSESSMENT AND PLAN  82 y.o. year old male with seizure disorder, hypertension, hyperlipidemia, alcoholism who is presenting for follow up for seizures and CVA. He is doing well on lamotrigine 100 mg twice daily.  He is also on Plavix for stroke prevention.  I have recommended him to continue all of his medications.  In terms of his mood swings, I did advise him to continue following with his PCP regarding the BuSpar and if needed we could also increase the lamotrigine 150 mg twice daily or up to 200 twice daily as tolerated.  I will see him in 6 months for follow-up or sooner if worse.    1. Seizure disorder ([place])   2. Therapeutic drug monitoring   3. 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  Orders Placed This Encounter  Procedures   Lamotrigine level   Basic Metabolic Panel    Meds ordered this encounter  Medications   lamoTRIgine (LAMICTAL) 100 MG tablet  Sig: Take 1 tablet (100 mg total) by mouth 2 (two) times daily.    Dispense:  60 tablet    Refill:  11    Return in about 6 months (around 12/24/2022).    Alric Ran, MD 06/26/2022, 9:26 AM  [facility] [address] [place], [place] [address] [phone]

## 2022-06-26 NOTE — Patient Instructions (Addendum)
Continue with lamotrigine 100 mg twice daily Will check level today with BMP Continue your other medications Continue follow-up PCP Follow-up with me in 6 months or sooner if worse.

## 2022-06-27 LAB — BASIC METABOLIC PANEL
BUN/Creatinine Ratio: 11 (ref 10–24)
BUN: 9 mg/dL (ref 8–27)
CO2: 24 mmol/L (ref 20–29)
Calcium: 9.7 mg/dL (ref 8.6–10.2)
Chloride: 90 mmol/L — ABNORMAL LOW (ref 96–106)
Creatinine, Ser: 0.84 mg/dL (ref 0.76–1.27)
Glucose: 89 mg/dL (ref 70–99)
Potassium: 5 mmol/L (ref 3.5–5.2)
Sodium: 128 mmol/L — ABNORMAL LOW (ref 134–144)
eGFR: 88 mL/min/{1.73_m2} (ref >59)

## 2022-06-27 LAB — LAMOTRIGINE LEVEL: Lamotrigine Lvl: 5.8 ug/mL (ref 2.0–20.0)

## 2022-06-30 DIAGNOSIS — G40909 Epilepsy, unspecified, not intractable, without status epilepticus: Secondary | ICD-10-CM | POA: Diagnosis not present

## 2022-06-30 DIAGNOSIS — Z8673 Personal history of transient ischemic attack (TIA), and cerebral infarction without residual deficits: Secondary | ICD-10-CM | POA: Diagnosis not present

## 2022-06-30 DIAGNOSIS — Z1331 Encounter for screening for depression: Secondary | ICD-10-CM | POA: Diagnosis not present

## 2022-06-30 DIAGNOSIS — G319 Degenerative disease of nervous system, unspecified: Secondary | ICD-10-CM | POA: Diagnosis not present

## 2022-06-30 DIAGNOSIS — Z139 Encounter for screening, unspecified: Secondary | ICD-10-CM | POA: Diagnosis not present

## 2022-06-30 DIAGNOSIS — R69 Illness, unspecified: Secondary | ICD-10-CM | POA: Diagnosis not present

## 2022-06-30 DIAGNOSIS — G629 Polyneuropathy, unspecified: Secondary | ICD-10-CM | POA: Diagnosis not present

## 2022-06-30 DIAGNOSIS — E871 Hypo-osmolality and hyponatremia: Secondary | ICD-10-CM | POA: Diagnosis not present

## 2022-06-30 DIAGNOSIS — D649 Anemia, unspecified: Secondary | ICD-10-CM | POA: Diagnosis not present

## 2022-06-30 DIAGNOSIS — E7800 Pure hypercholesterolemia, unspecified: Secondary | ICD-10-CM | POA: Diagnosis not present

## 2022-06-30 NOTE — Progress Notes (Signed)
Spoke with [name], informed her that patient Sodium level was low at 128. He is already on Sodium tab. They will follow up with PCP today, and  if we cannot increase the salt tab, will plan to change the ASM to either Lacosamide or Clobazam   Dr. April Manson

## 2022-07-07 DIAGNOSIS — R339 Retention of urine, unspecified: Secondary | ICD-10-CM | POA: Diagnosis not present

## 2022-07-15 DIAGNOSIS — R110 Nausea: Secondary | ICD-10-CM | POA: Diagnosis not present

## 2022-07-15 DIAGNOSIS — R6889 Other general symptoms and signs: Secondary | ICD-10-CM | POA: Diagnosis not present

## 2022-07-18 ENCOUNTER — Other Ambulatory Visit: Payer: Self-pay | Admitting: Neurology

## 2022-07-23 DIAGNOSIS — E7800 Pure hypercholesterolemia, unspecified: Secondary | ICD-10-CM | POA: Diagnosis not present

## 2022-07-23 DIAGNOSIS — Z79899 Other long term (current) drug therapy: Secondary | ICD-10-CM | POA: Diagnosis not present

## 2022-07-23 DIAGNOSIS — D649 Anemia, unspecified: Secondary | ICD-10-CM | POA: Diagnosis not present

## 2022-08-07 DIAGNOSIS — E871 Hypo-osmolality and hyponatremia: Secondary | ICD-10-CM | POA: Diagnosis not present

## 2022-08-13 DIAGNOSIS — R339 Retention of urine, unspecified: Secondary | ICD-10-CM | POA: Diagnosis not present

## 2022-08-17 DIAGNOSIS — H903 Sensorineural hearing loss, bilateral: Secondary | ICD-10-CM | POA: Diagnosis not present

## 2022-08-17 DIAGNOSIS — H6123 Impacted cerumen, bilateral: Secondary | ICD-10-CM | POA: Diagnosis not present

## 2022-09-04 DIAGNOSIS — H9313 Tinnitus, bilateral: Secondary | ICD-10-CM | POA: Diagnosis not present

## 2022-09-04 DIAGNOSIS — H903 Sensorineural hearing loss, bilateral: Secondary | ICD-10-CM | POA: Diagnosis not present

## 2022-09-04 DIAGNOSIS — R42 Dizziness and giddiness: Secondary | ICD-10-CM | POA: Diagnosis not present

## 2022-09-04 DIAGNOSIS — H6983 Other specified disorders of Eustachian tube, bilateral: Secondary | ICD-10-CM | POA: Diagnosis not present

## 2022-09-04 DIAGNOSIS — H6121 Impacted cerumen, right ear: Secondary | ICD-10-CM | POA: Diagnosis not present

## 2022-09-30 DIAGNOSIS — E7800 Pure hypercholesterolemia, unspecified: Secondary | ICD-10-CM | POA: Diagnosis not present

## 2022-09-30 DIAGNOSIS — G319 Degenerative disease of nervous system, unspecified: Secondary | ICD-10-CM | POA: Diagnosis not present

## 2022-09-30 DIAGNOSIS — G40909 Epilepsy, unspecified, not intractable, without status epilepticus: Secondary | ICD-10-CM | POA: Diagnosis not present

## 2022-09-30 DIAGNOSIS — Z1331 Encounter for screening for depression: Secondary | ICD-10-CM | POA: Diagnosis not present

## 2022-09-30 DIAGNOSIS — G629 Polyneuropathy, unspecified: Secondary | ICD-10-CM | POA: Diagnosis not present

## 2022-09-30 DIAGNOSIS — Z8673 Personal history of transient ischemic attack (TIA), and cerebral infarction without residual deficits: Secondary | ICD-10-CM | POA: Diagnosis not present

## 2022-09-30 DIAGNOSIS — R69 Illness, unspecified: Secondary | ICD-10-CM | POA: Diagnosis not present

## 2022-09-30 DIAGNOSIS — E871 Hypo-osmolality and hyponatremia: Secondary | ICD-10-CM | POA: Diagnosis not present

## 2022-10-15 ENCOUNTER — Ambulatory Visit: Payer: Medicare HMO | Admitting: Neurology

## 2022-10-15 ENCOUNTER — Encounter: Payer: Self-pay | Admitting: Neurology

## 2022-10-15 VITALS — BP 133/81 | HR 97 | Ht 69.0 in | Wt 167.0 lb

## 2022-10-15 DIAGNOSIS — I6329 Cerebral infarction due to unspecified occlusion or stenosis of other precerebral arteries: Secondary | ICD-10-CM | POA: Diagnosis not present

## 2022-10-15 DIAGNOSIS — G40909 Epilepsy, unspecified, not intractable, without status epilepticus: Secondary | ICD-10-CM | POA: Diagnosis not present

## 2022-10-15 MED ORDER — LAMOTRIGINE 100 MG PO TABS: 100.0000 mg | ORAL_TABLET | Freq: Two times a day (BID) | ORAL | 3 refills | Status: DC

## 2022-10-15 MED ORDER — LAMOTRIGINE 150 MG PO TABS: 150.0000 mg | ORAL_TABLET | Freq: Two times a day (BID) | ORAL | 3 refills | Status: DC

## 2022-10-15 NOTE — Progress Notes (Signed)
[facility]  PATIENT: Carlos Wise DOB: January 15, 1941  REQUESTING CLINICIAN: Hamrick, Durward Fortes, MD HISTORY FROM: Patient, caregiver and chart  REASON FOR VISIT: Hospital follow up   HISTORICAL  CHIEF COMPLAINT:  Chief Complaint  Patient presents with   Follow-up    Rm 12. No seizures reported, patient with caretaker.    INTERVAL HISTORY 10/15/2022:  Patient presents today for follow-up, Carlos Wise is accompanied by her caregiver.  Last visit in January plan was to continue with lamotrigine 100 mg twice daily.  He denies any seizure or seizure-like activity.  He is tolerating the medication very well, his last sodium level was normal at 140.  His PCP also increase his BuSpar which has been helpful.  He says his gait continues to improve, currently he is using a cane and not a walker. Caregiver reports that patient still have bouts of crying but BuSpar has been helpful. No other concerns    INTERVAL HISTORY 06/25/2022 Patient presents today for follow-up, he is accompanied by caregiver.  At last visit in October plan was to switch Keppra to Briviact due to irritability and mood swings.  Briviact was approved but the cost was high for patient.  At that point, I did try him on Trileptal.  He did however develop hyponatremia and presented to the hospital due to weakness.  At that time his Trileptal was discontinued and patient was started on Lamictal.  Currently he is doing well on Lamictal 100 mg twice daily.  Denies any seizures.  He reported he is still have temper issues and irritability and is PCP started him on Buspirone. Again, no seizures.   HISTORY OF PRESENT ILLNESS:  This is a 82 year old gentleman with past medical history of seizure disorder, hypertension, hyperlipidemia, alcoholism who is presenting after recent hospitalization for altered mental status, subdural hemorrhage and new lacunar stroke.  Patient was found down at home, he was initially very confused.  In the ED  he was noted to have a subdural bleed, neurosurgery evaluated not a candidate for surgery.  It is unclear what caused the initial confusion as patient was found also to have a new stroke.  His EEG showed diffuse slowing consistent with generalized encephalopathy.  His Keppra was increased from 1000 mg daily to 1000 mg twice daily.  He was continued on aspirin.  Since being home caregiver has noted increased irritability and anger.  He was not irritable when he was on Keppra at 1000 mg daily He still lives alone, somewhat independent still uses alcohol and denies any new seizure or seizure-like activity   Hospital course and summary  82 year old man with hx of alcoholism, tobacco abuse, prior seizures presenting after being found down with seizure activity per daughter.  Found to have small subdural bleed.  Remains confused.  Similar admits w/ postictal states to other hospitals. NSGY evaluated and reviewed scans, no further workup needed. Acute encephalopathy-likely multifactorial including seizure/post ictal, postconcussion, acute stroke, subdural hematoma, alcohol withdrawal, phenobarbital effect (he received phenobarb in the ICU for alcohol withdrawal) -neurology consulted and followed patient while hospitalized.  EEG LTM-no seizure, moderate diffuse encephalopathy/nonspecific secondary to underlying structural abnormality.  Neurology had recommended Keppra, and it appears that his dose has been increased to 1000 mg twice daily Adult failure to thrive -s/p Repeat SS eval 10/3= D3 diet with full supervision.  Patient initially had a core track, but was adamant to remove it.  He does not want any form of advanced feeding tube.  Now tolerating oral diet.  He is doing well with physical therapy, will be discharged home with home health. Caregiver Victorino Dike is arranging for 24/7 home care which will be available starting the day of discharge. Small acute infarcts in the left pons and left corona radiata -Likely  due to small vessel disease.  MRI confirmed CVA.  Echocardiogram EF 60%, LDL 161, A1c 5.1.  Continue aspirin, Lipitor 80 mg Hyponatremia-stable.  Alcohol abuse -initially requiring phenobarbital in the ICU.  No longer withdrawing  OTHER MEDICAL CONDITIONS: Seizure disorder, alcoholism, Hypertension, hyperlipidemia,    REVIEW OF SYSTEMS: Full 14 system review of systems performed and negative with exception of: As noted in the HPI   ALLERGIES: Allergies  Allergen Reactions   Oxcarbazepine     Significant and severe hyponatremia noted within a few days of starting Oxcarbazepine.   Penicillins Other (See Comments)    Patient states no allergy, but has preference that he does not want this medication    HOME MEDICATIONS: Outpatient Medications Prior to Visit  Medication Sig Dispense Refill   atorvastatin (LIPITOR) 20 MG tablet Take 10 mg by mouth daily.     busPIRone (BUSPAR) 15 MG tablet Take 15 mg by mouth 2 (two) times daily.     Cholecalciferol (VITAMIN D3 PO) Take 1 capsule by mouth daily.     clobetasol cream (TEMOVATE) 0.05 % Apply 1 Application topically daily as needed (itching scalp).     clopidogrel (PLAVIX) 75 MG tablet TAKE 1 TABLET BY MOUTH EVERY DAY 90 tablet 1   Cyanocobalamin (VITAMIN B-12 PO) Take 1 tablet by mouth daily.     fluticasone (FLONASE) 50 MCG/ACT nasal spray Place 1 spray into both nostrils daily as needed for allergies.     folic acid (FOLVITE) 1 MG tablet Take 1 mg by mouth daily.     gabapentin (NEURONTIN) 300 MG capsule Take 300 mg by mouth at bedtime.     ketotifen (ALLERGY EYE DROPS) 0.025 % ophthalmic solution Place 1 drop into both eyes daily as needed (red eyes).     pantoprazole (PROTONIX) 40 MG tablet Take 40 mg by mouth daily.     sodium chloride (OCEAN) 0.65 % SOLN nasal spray Place 1 spray into both nostrils daily as needed for congestion.     sodium chloride 1 g tablet Take 1 g by mouth 3 (three) times daily.     tamsulosin (FLOMAX) 0.4 MG  CAPS capsule Take 0.4 mg by mouth.     thiamine (VITAMIN B1) 100 MG tablet Take 250 mg by mouth daily.     triamcinolone cream (KENALOG) 0.1 % Apply 1 Application topically daily as needed (itching skin).     lamoTRIgine (LAMICTAL) 100 MG tablet Take 1 tablet (100 mg total) by mouth 2 (two) times daily. 60 tablet 11   aspirin EC 81 MG tablet Take 81 mg by mouth daily. Swallow whole. (Patient not taking: Reported on 10/15/2022)     Pseudoeph-Doxylamine-DM-APAP (NYQUIL PO) Take 2 capsules by mouth at bedtime as needed (sleep when aleve pm is no working). (Patient not taking: Reported on 10/15/2022)     No facility-administered medications prior to visit.    PAST MEDICAL HISTORY: Past Medical History:  Diagnosis Date   Herpes zoster    Hyperlipemia    Hypertension    Intestinal polyp    hyperplastic   Prostate cancer (HCC)    S/P radiation therapy 12/12/2014 through 02/06/2015  Prostate 7800 cGy in 40 sessions, seminal vesicles 5600 cGy in 40 sessions                           Seizure disorder (HCC)     PAST SURGICAL HISTORY: Past Surgical History:  Procedure Laterality Date   Carotid Artery Catherization     PROSTATE BIOPSY  06/27/2014    FAMILY HISTORY: Family History  Problem Relation Age of Onset   Colon cancer Mother    Prostate cancer Father    Heart attack Father     SOCIAL HISTORY: Social History   Socioeconomic History   Marital status: Widowed    Spouse name: Not on file   Number of children: 0   Years of education: Not on file   Highest education level: Not on file  Occupational History   Occupation: Retired  Tobacco Use   Smoking status: Former   Smokeless tobacco: Never   Tobacco comments:    Quit 30 years ago  Substance and Sexual Activity   Alcohol use: Yes    Alcohol/week: 0.0 standard drinks of alcohol    Comment: 3 oz daily   Drug use: No   Sexual activity: Not on file  Other Topics Concern    Not on file  Social History Narrative   Not on file   Social Determinants of Health   Financial Resource Strain: Not on file  Food Insecurity: No Food Insecurity (05/11/2022)   Hunger Vital Sign    Worried About Running Out of Food in the Last Year: Never true    Ran Out of Food in the Last Year: Never true  Transportation Needs: No Transportation Needs (05/11/2022)   PRAPARE - Administrator, Civil Service (Medical): No    Lack of Transportation (Non-Medical): No  Physical Activity: Not on file  Stress: Not on file  Social Connections: Not on file  Intimate Partner Violence: Not At Risk (04/29/2022)   Humiliation, Afraid, Rape, and Kick questionnaire    Fear of Current or Ex-Partner: No    Emotionally Abused: No    Physically Abused: No    Sexually Abused: No    PHYSICAL EXAM  GENERAL EXAM/CONSTITUTIONAL: Vitals:  Vitals:   10/15/22 1441  BP: 133/81  Pulse: 97  Weight: 167 lb (75.8 kg)  Height: 5\' 9"  (1.753 m)   Body mass index is 24.66 kg/m. Wt Readings from Last 3 Encounters:  10/15/22 167 lb (75.8 kg)  06/25/22 166 lb 12.8 oz (75.7 kg)  05/03/22 157 lb 10.1 oz (71.5 kg)   Patient is in no distress; well developed, nourished and groomed; neck is supple  MUSCULOSKELETAL: Gait, strength, tone, movements noted in Neurologic exam below  NEUROLOGIC: MENTAL STATUS:      No data to display         awake, alert, oriented to person, place and time with psychomotor retardation  recent and remote memory intact normal attention and concentration language fluent, comprehension intact, naming intact fund of knowledge appropriate  CRANIAL NERVE:  2nd, 3rd, 4th, 6th - visual fields full to confrontation, extraocular muscles intact, no nystagmus 5th - facial sensation symmetric 7th - facial strength symmetric 8th - hearing intact 9th - palate elevates symmetrically, uvula midline 11th - shoulder shrug symmetric 12th - tongue protrusion  midline  MOTOR:  normal bulk and tone, at least antigravity in the BUE and BLE  SENSORY:  normal and symmetric to light touch  COORDINATION:  finger-nose-finger, fine finger movements normal  GAIT/STATION:  Ambulates with a cane     DIAGNOSTIC DATA (LABS, IMAGING, TESTING) - I reviewed patient records, labs, notes, testing and imaging myself where available.  Lab Results  Component Value Date   WBC 8.5 05/03/2022   HGB 10.6 (L) 05/03/2022   HCT 30.7 (L) 05/03/2022   MCV 91.9 05/03/2022   PLT 241 05/03/2022      Component Value Date/Time   NA 128 (L) 06/25/2022 1537   K 5.0 06/25/2022 1537   CL 90 (L) 06/25/2022 1537   CO2 24 06/25/2022 1537   GLUCOSE 89 06/25/2022 1537   GLUCOSE 91 05/03/2022 0056   BUN 9 06/25/2022 1537   CREATININE 0.84 06/25/2022 1537   CALCIUM 9.7 06/25/2022 1537   PROT 6.8 04/29/2022 1036   ALBUMIN 4.1 04/29/2022 1036   AST 41 04/29/2022 1036   ALT 16 04/29/2022 1036   ALKPHOS 63 04/29/2022 1036   BILITOT 1.2 04/29/2022 1036   GFRNONAA >60 05/03/2022 0056   GFRAA >60 02/08/2020 0408   Lab Results  Component Value Date   CHOL 229 (H) 03/02/2022   HDL 48 03/02/2022   LDLCALC 161 (H) 03/02/2022   TRIG 98 03/02/2022   CHOLHDL 4.8 03/02/2022   Lab Results  Component Value Date   HGBA1C 5.1 03/02/2022   Lab Results  Component Value Date   VITAMINB12 827 04/29/2022   Lab Results  Component Value Date   TSH 1.477 04/29/2022    MRI Brain 03/01/22 1. 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 No associated hemorrhage or mass effect. 2. Otherwise stable noncontrast MRI appearance of the brain since June with moderately advanced signal changes in the white matter and pons suspected due to small vessel disease.  CT Head, CTA Head and Neck 03/01/2022 1. CT head negative for acute hemorrhage. 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 Small acute infarct left centrum semiovale best seen on MRI. 2.  Negative for intracranial large vessel occlusion. 3. 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 No residual aneurysm. 4. 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 Right internal carotid artery diffusely disease without significant stenosis 5. Atherosclerotic disease left carotid bifurcation without significant stenosis 6. No significant vertebral artery stenosis.  EEG 03/10/22 - Continuous slow, generalized and lateralized left hemisphere - Continuous slow, right temporal region  ASSESSMENT AND PLAN  82 y.o. year old male with seizure disorder, hypertension, hyperlipidemia, alcoholism who is presenting for follow up for seizures and CVA. He is doing well on lamotrigine 100 mg twice daily.  His last sodium level was normal at 140. Due to his bouts of crying, will increase the Lamictal to 150 twice daily.  We discussed side effect including tremor and dizziness and they understand to contact me if you experience any side effect.  Continue to follow with PCP return in 1 year or sooner if worse   1. Seizure disorder (HCC)   2. 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   Meds ordered this encounter  Medications   DISCONTD: lamoTRIgine (LAMICTAL) 100 MG tablet    Sig: Take 1  tablet (100 mg total) by mouth 2 (two) times daily.    Dispense:  180 tablet    Refill:  3   lamoTRIgine (LAMICTAL) 150 MG tablet    Sig: Take 1 tablet (150 mg total) by mouth 2 (two) times daily.    Dispense:  180 tablet    Refill:  3    Return in about 1 year (around 10/15/2023).   Windell Norfolk, MD 10/15/2022, 3:07 PM  [facility] [address] [place], [place] [address] [phone]

## 2022-10-15 NOTE — Patient Instructions (Signed)
Increase lamotrigine to 150 mg twice daily Continue your other medications Continue to follow PCP Follow-up in 1 year or sooner if worse, can follow-up with an NP

## 2022-10-19 ENCOUNTER — Telehealth: Payer: Self-pay | Admitting: Neurology

## 2022-10-19 ENCOUNTER — Encounter: Payer: Self-pay | Admitting: Neurology

## 2022-10-19 NOTE — Telephone Encounter (Signed)
Sent letter in mail informing pt of appt change from 7/16 to 7/3 due to provider schedule change

## 2022-11-05 DIAGNOSIS — E871 Hypo-osmolality and hyponatremia: Secondary | ICD-10-CM | POA: Diagnosis not present

## 2022-11-10 DIAGNOSIS — R339 Retention of urine, unspecified: Secondary | ICD-10-CM | POA: Diagnosis not present

## 2022-12-07 DIAGNOSIS — E871 Hypo-osmolality and hyponatremia: Secondary | ICD-10-CM | POA: Diagnosis not present

## 2022-12-07 DIAGNOSIS — G4700 Insomnia, unspecified: Secondary | ICD-10-CM | POA: Diagnosis not present

## 2022-12-07 DIAGNOSIS — R413 Other amnesia: Secondary | ICD-10-CM | POA: Diagnosis not present

## 2022-12-16 ENCOUNTER — Ambulatory Visit: Payer: Medicare HMO | Admitting: Neurology

## 2022-12-24 ENCOUNTER — Telehealth: Payer: Self-pay

## 2022-12-24 NOTE — Patient Outreach (Signed)
  Care Coordination   Initial Visit Note   12/24/2022 Name: KACYN SOUDER MRN: [id number] DOB: 01-09-1941  KELLIS MCADAM is a 82 y.o. year old male who sees Hamrick, Durward Fortes, MD for primary care. I spoke with  Jillene Bucks by phone today.  What matters to the patients health and wellness today?  Placed call to patient X2  and he answered.  Reviewed [facility] care coordination program and patient hung up on me X2.   Will not call back.     SDOH assessments and interventions completed:  No     Care Coordination Interventions:  No, not indicated   Follow up plan: No further intervention required.   Encounter Outcome:  Pt. Visit Completed   Rowe Pavy, RN, BSN, CEN [facility] Coordinator [phone]

## 2022-12-29 ENCOUNTER — Ambulatory Visit: Payer: Medicare HMO | Admitting: Neurology

## 2022-12-30 DIAGNOSIS — S40021A Contusion of right upper arm, initial encounter: Secondary | ICD-10-CM | POA: Diagnosis not present

## 2022-12-30 DIAGNOSIS — E871 Hypo-osmolality and hyponatremia: Secondary | ICD-10-CM | POA: Diagnosis not present

## 2022-12-30 DIAGNOSIS — W19XXXA Unspecified fall, initial encounter: Secondary | ICD-10-CM | POA: Diagnosis not present

## 2022-12-30 DIAGNOSIS — G629 Polyneuropathy, unspecified: Secondary | ICD-10-CM | POA: Diagnosis not present

## 2022-12-30 DIAGNOSIS — Y92009 Unspecified place in unspecified non-institutional (private) residence as the place of occurrence of the external cause: Secondary | ICD-10-CM | POA: Diagnosis not present

## 2022-12-30 DIAGNOSIS — S20211A Contusion of right front wall of thorax, initial encounter: Secondary | ICD-10-CM | POA: Diagnosis not present

## 2022-12-30 DIAGNOSIS — D649 Anemia, unspecified: Secondary | ICD-10-CM | POA: Diagnosis not present

## 2023-01-03 ENCOUNTER — Emergency Department (HOSPITAL_COMMUNITY): Payer: Medicare HMO

## 2023-01-03 ENCOUNTER — Other Ambulatory Visit: Payer: Self-pay

## 2023-01-03 ENCOUNTER — Encounter (HOSPITAL_COMMUNITY): Payer: Self-pay

## 2023-01-03 ENCOUNTER — Emergency Department (HOSPITAL_COMMUNITY)
Admission: EM | Admit: 2023-01-03 | Discharge: 2023-01-03 | Disposition: A | Discharge status: Home / Self Care | Payer: Medicare HMO | Source: Home / Self Care | Attending: Emergency Medicine | Admitting: Emergency Medicine

## 2023-01-03 DIAGNOSIS — Z79899 Other long term (current) drug therapy: Secondary | ICD-10-CM | POA: Diagnosis not present

## 2023-01-03 DIAGNOSIS — E871 Hypo-osmolality and hyponatremia: Secondary | ICD-10-CM | POA: Diagnosis not present

## 2023-01-03 DIAGNOSIS — E878 Other disorders of electrolyte and fluid balance, not elsewhere classified: Secondary | ICD-10-CM | POA: Diagnosis not present

## 2023-01-03 DIAGNOSIS — I672 Cerebral atherosclerosis: Secondary | ICD-10-CM | POA: Diagnosis not present

## 2023-01-03 DIAGNOSIS — Z8673 Personal history of transient ischemic attack (TIA), and cerebral infarction without residual deficits: Secondary | ICD-10-CM | POA: Insufficient documentation

## 2023-01-03 DIAGNOSIS — Z7982 Long term (current) use of aspirin: Secondary | ICD-10-CM | POA: Diagnosis not present

## 2023-01-03 DIAGNOSIS — I509 Heart failure, unspecified: Secondary | ICD-10-CM | POA: Insufficient documentation

## 2023-01-03 DIAGNOSIS — I6622 Occlusion and stenosis of left posterior cerebral artery: Secondary | ICD-10-CM | POA: Diagnosis not present

## 2023-01-03 DIAGNOSIS — R110 Nausea: Secondary | ICD-10-CM | POA: Diagnosis not present

## 2023-01-03 DIAGNOSIS — I110 Hypertensive heart disease with heart failure: Secondary | ICD-10-CM | POA: Insufficient documentation

## 2023-01-03 DIAGNOSIS — D649 Anemia, unspecified: Secondary | ICD-10-CM | POA: Insufficient documentation

## 2023-01-03 DIAGNOSIS — Z7902 Long term (current) use of antithrombotics/antiplatelets: Secondary | ICD-10-CM | POA: Insufficient documentation

## 2023-01-03 DIAGNOSIS — Z743 Need for continuous supervision: Secondary | ICD-10-CM | POA: Diagnosis not present

## 2023-01-03 DIAGNOSIS — R4182 Altered mental status, unspecified: Secondary | ICD-10-CM | POA: Diagnosis not present

## 2023-01-03 DIAGNOSIS — I6523 Occlusion and stenosis of bilateral carotid arteries: Secondary | ICD-10-CM | POA: Diagnosis not present

## 2023-01-03 DIAGNOSIS — R404 Transient alteration of awareness: Secondary | ICD-10-CM | POA: Insufficient documentation

## 2023-01-03 DIAGNOSIS — R4781 Slurred speech: Secondary | ICD-10-CM | POA: Diagnosis not present

## 2023-01-03 DIAGNOSIS — R9082 White matter disease, unspecified: Secondary | ICD-10-CM | POA: Diagnosis not present

## 2023-01-03 DIAGNOSIS — I671 Cerebral aneurysm, nonruptured: Secondary | ICD-10-CM | POA: Diagnosis not present

## 2023-01-03 DIAGNOSIS — I10 Essential (primary) hypertension: Secondary | ICD-10-CM | POA: Diagnosis not present

## 2023-01-03 LAB — CBC
HCT: 35.3 % — ABNORMAL LOW (ref 39.0–52.0)
Hemoglobin: 12.1 g/dL — ABNORMAL LOW (ref 13.0–17.0)
MCH: 30.9 pg (ref 26.0–34.0)
MCHC: 34.3 g/dL (ref 30.0–36.0)
MCV: 90.1 fL (ref 80.0–100.0)
Platelets: 243 10*3/uL (ref 150–400)
RBC: 3.92 MIL/uL — ABNORMAL LOW (ref 4.22–5.81)
RDW: 12.4 % (ref 11.5–15.5)
WBC: 7.8 10*3/uL (ref 4.0–10.5)
nRBC: 0 % (ref 0.0–0.2)

## 2023-01-03 LAB — BASIC METABOLIC PANEL
Anion gap: 13 (ref 5–15)
BUN: 7 mg/dL — ABNORMAL LOW (ref 8–23)
CO2: 22 mmol/L (ref 22–32)
Calcium: 9.3 mg/dL (ref 8.9–10.3)
Chloride: 92 mmol/L — ABNORMAL LOW (ref 98–111)
Creatinine, Ser: 0.69 mg/dL (ref 0.61–1.24)
GFR, Estimated: >60 mL/min (ref >60)
Glucose, Bld: 103 mg/dL — ABNORMAL HIGH (ref 70–99)
Potassium: 3.9 mmol/L (ref 3.5–5.1)
Sodium: 127 mmol/L — ABNORMAL LOW (ref 135–145)

## 2023-01-03 MED ORDER — IOHEXOL 350 MG/ML SOLN
75.0000 mL | Freq: Once | INTRAVENOUS | Status: AC | PRN
  Administered 2023-01-03: 75 mL via INTRAVENOUS

## 2023-01-03 NOTE — ED Provider Notes (Signed)
[place] EMERGENCY DEPARTMENT AT [facility] Provider Note   CSN: [id number] Arrival date & time: 01/03/23  4098     History  Chief Complaint  Patient presents with   Altered Mental Status    Carlos Wise is a 82 y.o. male with past medical history significant for htn, hld, previous SDH, CVA, CHF, who presents with possible slurred speech or abnormal mental status.  EMS called to home as caretaker reported he was "off" and had questionable slurred speech.  When EMS arrived patient with no complaints, no slurred speech, negative stroke scale.  He did not want to come to the emergency department as he does not have any complaints at this time but caretaker insisted.  Patient reports that the EMS ride was bumpy and he received Zofran, otherwise with no complaints at this time.  Previous subdural hematoma, and occlusive CVA of left pontine artery, reports no blood thinner use but is documented as taking plavix. No recent falls. Patient Aox4.  On interview with caretaker she endorsed also some brief facial droop.  Altered Mental Status      Home Medications Prior to Admission medications   Medication Sig Start Date End Date Taking? Authorizing Provider  aspirin EC 81 MG tablet Take 81 mg by mouth daily. Swallow whole. Patient not taking: Reported on 10/15/2022    [provider]  atorvastatin (LIPITOR) 20 MG tablet Take 10 mg by mouth daily.    [provider]  busPIRone (BUSPAR) 15 MG tablet Take 15 mg by mouth 2 (two) times daily. 06/20/22   [provider]  Cholecalciferol (VITAMIN D3 PO) Take 1 capsule by mouth daily.    [provider]  clobetasol cream (TEMOVATE) 0.05 % Apply 1 Application topically daily as needed (itching scalp).    [provider]  clopidogrel (PLAVIX) 75 MG tablet TAKE 1 TABLET BY MOUTH EVERY DAY 07/22/22   Windell Norfolk, MD  Cyanocobalamin (VITAMIN B-12 PO) Take 1 tablet by mouth daily.    [provider]  fluticasone (FLONASE) 50 MCG/ACT nasal spray Place 1 spray into both nostrils daily as needed for allergies. 12/10/19   [provider]  folic acid (FOLVITE) 1 MG tablet Take 1 mg by mouth daily.    [provider]  gabapentin (NEURONTIN) 300 MG capsule Take 300 mg by mouth at bedtime.    [provider]  ketotifen (ALLERGY EYE DROPS) 0.025 % ophthalmic solution Place 1 drop into both eyes daily as needed (red eyes).    [provider]  lamoTRIgine (LAMICTAL) 150 MG tablet Take 1 tablet (150 mg total) by mouth 2 (two) times daily. 10/15/22 10/10/23  Windell Norfolk, MD  pantoprazole (PROTONIX) 40 MG tablet Take 40 mg by mouth daily.    [provider]  Pseudoeph-Doxylamine-DM-APAP (NYQUIL PO) Take 2 capsules by mouth at bedtime as needed (sleep when aleve pm is no working). Patient not taking: Reported on 10/15/2022    [provider]  sodium chloride (OCEAN) 0.65 % SOLN nasal spray Place 1 spray into both nostrils daily as needed for congestion.    [provider]  sodium chloride 1 g tablet Take 1 g by mouth 3 (three) times daily.    [provider]  tamsulosin (FLOMAX) 0.4 MG CAPS capsule Take 0.4 mg by mouth.    [provider]  thiamine (VITAMIN B1) 100 MG tablet Take 250 mg by mouth daily. 02/16/22   [provider]  triamcinolone cream (KENALOG)  0.1 % Apply 1 Application topically daily as needed (itching skin).    [provider]      Allergies    Oxcarbazepine and Penicillins    Review of Systems   Review of Systems  All other systems reviewed and are negative.   Physical Exam Updated Vital Signs BP (!) 153/91   Pulse 74   Temp 98 F (36.7 C) (Oral)   Resp 15   SpO2 96%  Physical Exam Vitals and nursing note reviewed.  Constitutional:      General: He is not in acute distress.    Appearance: Normal appearance.  HENT:     Head: Normocephalic and atraumatic.  Eyes:      General:        Right eye: No discharge.        Left eye: No discharge.  Cardiovascular:     Rate and Rhythm: Normal rate and regular rhythm.     Heart sounds: No murmur heard.    No friction rub. No gallop.  Pulmonary:     Effort: Pulmonary effort is normal.     Breath sounds: Normal breath sounds.  Abdominal:     General: Bowel sounds are normal.     Palpations: Abdomen is soft.  Skin:    General: Skin is warm and dry.     Capillary Refill: Capillary refill takes less than 2 seconds.  Neurological:     Mental Status: He is alert and oriented to person, place, and time.     Comments: Cranial nerves II through XII grossly intact.  Intact finger-nose, intact heel-to-shin.  Romberg negative, gait normal.  Alert and oriented x3.  Moves all 4 limbs spontaneously, normal coordination.  No pronator drift.  Intact strength 5 out of 5 bilateral upper and lower extremities.    Psychiatric:        Mood and Affect: Mood normal.        Behavior: Behavior normal.     ED Results / Procedures / Treatments   Labs (all labs ordered are listed, but only abnormal results are displayed) Labs Reviewed  CBC - Abnormal; Notable for the following components:      Result Value   RBC 3.92 (*)    Hemoglobin 12.1 (*)    HCT 35.3 (*)    All other components within normal limits  BASIC METABOLIC PANEL - Abnormal; Notable for the following components:   Sodium 127 (*)    Chloride 92 (*)    Glucose, Bld 103 (*)    BUN 7 (*)    All other components within normal limits    EKG EKG Interpretation Date/Time:  Sunday January 03 2023 08:46:19 EDT Ventricular Rate:  76 PR Interval:  204 QRS Duration:  88 QT Interval:  405 QTC Calculation: 456 R Axis:   49  Text Interpretation: Sinus rhythm Confirmed by Vivi Barrack [phone]) on 01/03/2023 12:44:10 PM  Radiology CT ANGIO HEAD NECK W WO CM  Result Date: 01/03/2023 CLINICAL DATA:  Stroke, determine embolic source EXAM: CT ANGIOGRAPHY HEAD AND NECK  WITH AND WITHOUT CONTRAST TECHNIQUE: Multidetector CT imaging of the head and neck was performed using the standard protocol during bolus administration of intravenous contrast. Multiplanar CT image reconstructions and MIPs were obtained to evaluate the vascular anatomy. Carotid stenosis measurements (when applicable) are obtained utilizing NASCET criteria, using the distal internal carotid diameter as the denominator. RADIATION DOSE REDUCTION: This exam was performed according to the departmental dose-optimization program which includes automated exposure  control, adjustment of the mA and/or kV according to patient size and/or use of iterative reconstruction technique. CONTRAST:  75mL OMNIPAQUE IOHEXOL 350 MG/ML SOLN COMPARISON:  Head CT from yesterday. CTA of the head neck 03/09/2022 FINDINGS: CTA NECK FINDINGS Aortic arch: Atheromatous plaque with 3 vessel branching. Borderline dilatation of the ascending aorta Right carotid system: Diffuse atheromatous wall thickening with heavy calcified plaque deposition at the bifurcation but not causing flow limiting stenosis. No ulceration. Left carotid system: Heavy calcified plaque deposition at the bifurcation but no flow reducing stenosis or ulceration. Vertebral arteries: The vertebral arteries are smoothly contoured and diffusely patent. No dissection or stenosis. Skeleton: Generalized cervical spine degeneration. Other neck: No acute finding Upper chest: Clear apical lungs Review of the MIP images confirms the above findings CTA HEAD FINDINGS Anterior circulation: Calcified plaque along the carotid siphons. Stent assisted right ICA aneurysm coiling without evidence of recurrence or second aneurysm. There is atheromatous irregularity of intracranial branches including high-grade right M1 stenosis. Posterior circulation: The vertebral, basilar, and branch vessels are patent without flow reducing stenosis. Atheromatous irregularity of the posterior cerebral arteries  with mild-to-moderate narrowing on the left. Venous sinuses: Diffusely patent Anatomic variants: None significant Review of the MIP images confirms the above findings IMPRESSION: 1. No emergent finding. 2. 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 No ulceration or significant stenosis in the neck. 3. 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 Electronically Signed   By: Tiburcio Pea M.D.   On: 01/03/2023 13:01   CT Head Wo Contrast  Result Date: 01/03/2023 CLINICAL DATA:  Mental status changes. EXAM: CT HEAD WITHOUT CONTRAST TECHNIQUE: Contiguous axial images were obtained from the base of the skull through the vertex without intravenous contrast. RADIATION DOSE REDUCTION: This exam was performed according to the departmental dose-optimization program which includes automated exposure control, adjustment of the mA and/or kV according to patient size and/or use of iterative reconstruction technique. COMPARISON:  04/29/2022 FINDINGS: Brain: There is no evidence for acute hemorrhage, hydrocephalus, mass lesion, or abnormal extra-axial fluid collection. No definite CT evidence for acute infarction. Diffuse loss of parenchymal volume is consistent with atrophy. Patchy low attenuation in the deep hemispheric and periventricular white matter is nonspecific, but likely reflects chronic microvascular ischemic demyelination. Patchy hypodensity in the brainstem is similar to prior suggesting chronic etiology. 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 Skull: No evidence for fracture. No worrisome lytic or sclerotic lesion. Sinuses/Orbits: Mucosal thickening noted right maxillary sinus. Visualized portions of the globes and intraorbital fat are unremarkable. Other: None. IMPRESSION: 1. Stable exam. No acute intracranial abnormality. 2. Atrophy with chronic small vessel white matter ischemic disease. 3. 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 Electronically Signed   By: Kennith Center M.D.   On: 01/03/2023 10:13    Procedures Procedures    Medications Ordered in ED Medications  iohexol (OMNIPAQUE) 350 MG/ML injection 75 mL (75 mLs Intravenous Contrast Given 01/03/23 1248)    ED Course/ Medical Decision Making/ A&P                             Medical Decision Making  This patient is a 82 y.o. male who presents to the ED for concern of questionable altered mental status, this involves an extensive number of treatment options, and is a complaint that carries with it a high risk of complications and  morbidity. The emergent differential diagnosis prior to evaluation includes, but is not limited to,  CVA, spinal cord injury, ACS, arrhythmia, syncope, orthostatic hypotension, sepsis, hypoglycemia, hypoxia, electrolyte disturbance, endocrine disorder, anemia, environmental exposure, polypharmacy . This is not an exhaustive differential.   Past Medical History / Co-morbidities / Social History:  htn, hld, previous SDH, CVA, CHF  Additional history: Chart reviewed. Pertinent results include: Reviewed outpatient neurology visits for previous CVA, seizure disorder, as well as previous hospital admissions, notably with some chronic hyponatremia at baseline  Physical Exam: Physical exam performed. The pertinent findings include: Cranial nerves II through XII grossly intact.  Intact finger-nose, intact heel-to-shin.  Romberg negative, gait normal.  Alert and oriented x3.  Moves all 4 limbs spontaneously, normal coordination.  No pronator drift.  Intact strength 5 out of 5 bilateral upper and lower extremities.  Overall well-appearing, stable vital signs other than some moderate hypertension, blood pressure 167/94  Lab Tests: I ordered, and personally interpreted labs.  The pertinent results include: Hyponatremia overall stable compared to baseline, sodium 127 with recent previous at 128-131 range.   Mild hypochloremia, chloride 92.  CBC overall unremarkable, very mild anemia, hemoglobin 12.1.   Imaging Studies: I ordered imaging studies including CT head without contrast. I independently visualized and interpreted imaging which showed no evidence of new intracranial process. I agree with the radiologist interpretation.   Cardiac Monitoring:  The patient was maintained on a cardiac monitor.  My attending physician Dr. Jearld Fenton viewed and interpreted the cardiac monitored which showed an underlying rhythm of: NSR. I agree with this interpretation.   Consults: Spoke with Dr. Greggory Stallion, and discussed his findings, CT, and he recommended CTA Head neck to evaluate for new or critical stenosis.  I independently interpreted imaging including CT angio head neck with and without contrast which shows no new or worsening stenosis, or other explanation for questionable TIA this morning. I agree with the radiologist interpretation.  Disposition: After consideration of the diagnostic results and the patients response to treatment, I feel that patient with no signs of abnormal mental status or focal neurologic deficits in the emergency department.  While his questionable slurred speech may have represented TIA he is at goal-directed therapy with Plavix, aspirin for possible TIA, and I do not think additional workup is warranted at this time given previous workup and therapy at this time.   emergency department workup does not suggest an emergent condition requiring admission or immediate intervention beyond what has been performed at this time. The plan is: as above. The patient is safe for discharge and has been instructed to return immediately for worsening symptoms, change in symptoms or any other concerns.  I discussed this case with my attending physician Dr. Jearld Fenton who cosigned this note including patient's presenting symptoms, physical exam, and planned diagnostics and interventions. Attending physician  stated agreement with plan or made changes to plan which were implemented.    Final Clinical Impression(s) / ED Diagnoses Final diagnoses:  Transient alteration of awareness  Hyponatremia    Rx / DC Orders ED Discharge Orders     None         Olene Floss, PA-C 01/03/23 1310    Loetta Rough, MD 01/03/23 1408

## 2023-01-03 NOTE — ED Triage Notes (Signed)
Pt BIB [facility] from home where his caretaker called stating he was off and had slurred speech. [facility] arrived and pt had no complaints, no slurred speech initially stated he did not want to come but his caretaker insisted. Pt received 4mg  of zofran on the way d/t the ride being bumpy.

## 2023-01-04 ENCOUNTER — Telehealth: Payer: Self-pay

## 2023-01-04 NOTE — Telephone Encounter (Signed)
Transition Care Management Unsuccessful Follow-up Telephone Call  Date of discharge and from where:  [name] 7[facility]  Attempts:  1st Attempt  Reason for unsuccessful TCM follow-up call:  No answer/busy   Lenard Forth [facility] Guide, [facility] [phone] [address] [place], [place], [place] [address] Phone: [phone] Email: [email or web address]

## 2023-01-05 ENCOUNTER — Telehealth: Payer: Self-pay

## 2023-01-05 NOTE — Telephone Encounter (Signed)
Transition Care Management Unsuccessful Follow-up Telephone Call  Date of discharge and from where:  [name]  Attempts:  2nd Attempt  Reason for unsuccessful TCM follow-up call:  No answer/busy   Lenard Forth [facility] Guide, [facility] [phone] [address] [place], [place], [place] [address] Phone: [phone] Email: [email or web address]

## 2023-01-15 DIAGNOSIS — E871 Hypo-osmolality and hyponatremia: Secondary | ICD-10-CM | POA: Diagnosis not present

## 2023-01-20 DIAGNOSIS — Z0000 Encounter for general adult medical examination without abnormal findings: Secondary | ICD-10-CM | POA: Diagnosis not present

## 2023-01-20 DIAGNOSIS — Z1331 Encounter for screening for depression: Secondary | ICD-10-CM | POA: Diagnosis not present

## 2023-01-20 DIAGNOSIS — Z9181 History of falling: Secondary | ICD-10-CM | POA: Diagnosis not present

## 2023-01-27 ENCOUNTER — Other Ambulatory Visit: Payer: Self-pay | Admitting: Neurology

## 2023-02-01 DIAGNOSIS — E871 Hypo-osmolality and hyponatremia: Secondary | ICD-10-CM | POA: Diagnosis not present

## 2023-02-05 DIAGNOSIS — R339 Retention of urine, unspecified: Secondary | ICD-10-CM | POA: Diagnosis not present

## 2023-02-16 DIAGNOSIS — E871 Hypo-osmolality and hyponatremia: Secondary | ICD-10-CM | POA: Diagnosis not present

## 2023-02-17 DIAGNOSIS — H6123 Impacted cerumen, bilateral: Secondary | ICD-10-CM | POA: Diagnosis not present

## 2023-02-17 DIAGNOSIS — H903 Sensorineural hearing loss, bilateral: Secondary | ICD-10-CM | POA: Diagnosis not present

## 2023-03-16 DIAGNOSIS — E871 Hypo-osmolality and hyponatremia: Secondary | ICD-10-CM | POA: Diagnosis not present

## 2023-04-06 DIAGNOSIS — E7800 Pure hypercholesterolemia, unspecified: Secondary | ICD-10-CM | POA: Diagnosis not present

## 2023-04-06 DIAGNOSIS — Z8673 Personal history of transient ischemic attack (TIA), and cerebral infarction without residual deficits: Secondary | ICD-10-CM | POA: Diagnosis not present

## 2023-04-06 DIAGNOSIS — Z23 Encounter for immunization: Secondary | ICD-10-CM | POA: Diagnosis not present

## 2023-04-06 DIAGNOSIS — Z79899 Other long term (current) drug therapy: Secondary | ICD-10-CM | POA: Diagnosis not present

## 2023-04-06 DIAGNOSIS — E871 Hypo-osmolality and hyponatremia: Secondary | ICD-10-CM | POA: Diagnosis not present

## 2023-04-06 DIAGNOSIS — G629 Polyneuropathy, unspecified: Secondary | ICD-10-CM | POA: Diagnosis not present

## 2023-04-06 DIAGNOSIS — G40909 Epilepsy, unspecified, not intractable, without status epilepticus: Secondary | ICD-10-CM | POA: Diagnosis not present

## 2023-04-06 DIAGNOSIS — G319 Degenerative disease of nervous system, unspecified: Secondary | ICD-10-CM | POA: Diagnosis not present

## 2023-04-06 DIAGNOSIS — F410 Panic disorder [episodic paroxysmal anxiety] without agoraphobia: Secondary | ICD-10-CM | POA: Diagnosis not present

## 2023-04-06 DIAGNOSIS — F411 Generalized anxiety disorder: Secondary | ICD-10-CM | POA: Diagnosis not present

## 2023-04-06 DIAGNOSIS — F1027 Alcohol dependence with alcohol-induced persisting dementia: Secondary | ICD-10-CM | POA: Diagnosis not present

## 2023-04-06 DIAGNOSIS — F1020 Alcohol dependence, uncomplicated: Secondary | ICD-10-CM | POA: Diagnosis not present

## 2023-04-22 DIAGNOSIS — R21 Rash and other nonspecific skin eruption: Secondary | ICD-10-CM | POA: Diagnosis not present

## 2023-05-10 DIAGNOSIS — B372 Candidiasis of skin and nail: Secondary | ICD-10-CM | POA: Diagnosis not present

## 2023-05-10 DIAGNOSIS — F1020 Alcohol dependence, uncomplicated: Secondary | ICD-10-CM | POA: Diagnosis not present

## 2023-05-10 DIAGNOSIS — L853 Xerosis cutis: Secondary | ICD-10-CM | POA: Diagnosis not present

## 2023-05-10 DIAGNOSIS — E871 Hypo-osmolality and hyponatremia: Secondary | ICD-10-CM | POA: Diagnosis not present

## 2023-05-25 DIAGNOSIS — K2970 Gastritis, unspecified, without bleeding: Secondary | ICD-10-CM | POA: Diagnosis not present

## 2023-05-25 DIAGNOSIS — K222 Esophageal obstruction: Secondary | ICD-10-CM | POA: Diagnosis not present

## 2023-05-25 DIAGNOSIS — F1010 Alcohol abuse, uncomplicated: Secondary | ICD-10-CM | POA: Diagnosis not present

## 2023-05-25 DIAGNOSIS — F1020 Alcohol dependence, uncomplicated: Secondary | ICD-10-CM | POA: Diagnosis not present

## 2023-05-25 DIAGNOSIS — Z791 Long term (current) use of non-steroidal anti-inflammatories (NSAID): Secondary | ICD-10-CM | POA: Diagnosis not present

## 2023-06-01 ENCOUNTER — Telehealth: Payer: Self-pay

## 2023-06-01 NOTE — Telephone Encounter (Signed)
Surgical clearance faxed back to [facility] clinic

## 2023-06-17 DIAGNOSIS — E871 Hypo-osmolality and hyponatremia: Secondary | ICD-10-CM | POA: Diagnosis not present

## 2023-07-11 ENCOUNTER — Other Ambulatory Visit: Payer: Self-pay | Admitting: Neurology

## 2023-07-12 NOTE — Telephone Encounter (Signed)
Rx refilled.

## 2023-07-19 DIAGNOSIS — E7800 Pure hypercholesterolemia, unspecified: Secondary | ICD-10-CM | POA: Diagnosis not present

## 2023-07-19 DIAGNOSIS — Z79899 Other long term (current) drug therapy: Secondary | ICD-10-CM | POA: Diagnosis not present

## 2023-07-22 NOTE — H&P (Signed)
 Patient took his plavix  yesterday. Unfortunately need 5 day hold prior to egd and dilation. Pt is being given new date and instructions.  Jorje Newton, DO [facility]

## 2023-07-23 ENCOUNTER — Encounter: Payer: Self-pay | Admitting: Anesthesiology

## 2023-07-23 ENCOUNTER — Ambulatory Visit
Admission: RE | Admit: 2023-07-23 | Discharge: 2023-07-23 | Disposition: A | Discharge status: Home / Self Care | Payer: Medicare HMO | Attending: Gastroenterology | Admitting: Gastroenterology

## 2023-07-23 ENCOUNTER — Encounter
Admission: RE | Disposition: A | Discharge status: Home / Self Care | Payer: Self-pay | Source: Home / Self Care | Attending: Gastroenterology

## 2023-07-23 ENCOUNTER — Encounter: Payer: Self-pay | Admitting: Gastroenterology

## 2023-07-23 DIAGNOSIS — Z7902 Long term (current) use of antithrombotics/antiplatelets: Secondary | ICD-10-CM | POA: Diagnosis not present

## 2023-07-23 DIAGNOSIS — Z538 Procedure and treatment not carried out for other reasons: Secondary | ICD-10-CM | POA: Insufficient documentation

## 2023-07-23 DIAGNOSIS — R1310 Dysphagia, unspecified: Secondary | ICD-10-CM | POA: Insufficient documentation

## 2023-07-23 SURGERY — ESOPHAGOGASTRODUODENOSCOPY (EGD) WITH PROPOFOL
Anesthesia: General

## 2023-07-23 MED ORDER — SODIUM CHLORIDE 0.9 % IV SOLN: INTRAVENOUS | Status: DC

## 2023-07-23 NOTE — Anesthesia Preprocedure Evaluation (Signed)
 Anesthesia Evaluation  Patient identified by MRN, date of birth, ID band Patient awake    Reviewed: Allergy & Precautions, NPO status , Patient's Chart, lab work & pertinent test results  Airway Mallampati: III  TM Distance: >3 FB Neck ROM: full    Dental  (+) Chipped   Pulmonary neg pulmonary ROS, former smoker   Pulmonary exam normal        Cardiovascular hypertension, +CHF  Normal cardiovascular exam     Neuro/Psych  PSYCHIATRIC DISORDERS  Depression    CVA    GI/Hepatic Neg liver ROS,GERD  Medicated and Controlled,,  Endo/Other  negative endocrine ROS    Renal/GU negative Renal ROS  negative genitourinary   Musculoskeletal   Abdominal   Peds  Hematology negative hematology ROS (+)   Anesthesia Other Findings Past Medical History: No date: Herpes zoster No date: Hyperlipemia No date: Hypertension No date: Intestinal polyp     Comment:  hyperplastic No date: Prostate cancer (HCC) 12/12/2014 through 08/24/ 2016 : S/P  radiation therapy     Comment:  Prostate 7800 cGy in 40 sessions, seminal vesicles 5600               cGy in 40 sessions  No date: Seizure disorder [facility])  Past Surgical History: No date: Carotid Artery Catherization 06/27/2014: PROSTATE BIOPSY  BMI    Body Mass Index: 23.97 kg/m      Reproductive/Obstetrics negative OB ROS                             Anesthesia Physical Anesthesia Plan  ASA: 3  Anesthesia Plan: General   Post-op Pain Management: Minimal or no pain anticipated   Induction: Intravenous  PONV Risk Score and Plan: 3 and Propofol  infusion, TIVA and Ondansetron   Airway Management Planned: Nasal Cannula  Additional Equipment: None  Intra-op Plan:   Post-operative Plan:   Informed Consent: I have reviewed the patients History and Physical, chart, labs and discussed  the procedure including the risks, benefits and alternatives for the proposed anesthesia with the patient or authorized representative who has indicated his/her understanding and acceptance.     Dental advisory given  Plan Discussed with: CRNA and Surgeon  Anesthesia Plan Comments: (Discussed risks of anesthesia with patient, including possibility of difficulty with spontaneous ventilation under anesthesia necessitating airway intervention, PONV, and rare risks such as cardiac or respiratory or neurological events, and allergic reactions. Discussed the role of CRNA in patient's perioperative care. Patient understands.)       Anesthesia Quick Evaluation

## 2023-07-23 NOTE — Progress Notes (Signed)
 Patient cancelled due to not stopping Eliquis

## 2023-07-29 ENCOUNTER — Encounter: Payer: Self-pay | Admitting: Gastroenterology

## 2023-07-30 ENCOUNTER — Ambulatory Visit: Payer: Medicare HMO | Admitting: Certified Registered"

## 2023-07-30 ENCOUNTER — Ambulatory Visit
Admission: RE | Admit: 2023-07-30 | Discharge: 2023-07-30 | Disposition: A | Discharge status: Home / Self Care | Payer: Medicare HMO | Attending: Gastroenterology | Admitting: Gastroenterology

## 2023-07-30 ENCOUNTER — Other Ambulatory Visit: Payer: Self-pay

## 2023-07-30 ENCOUNTER — Encounter: Payer: Self-pay | Admitting: Gastroenterology

## 2023-07-30 ENCOUNTER — Encounter
Admission: RE | Disposition: A | Discharge status: Home / Self Care | Payer: Self-pay | Source: Home / Self Care | Attending: Gastroenterology

## 2023-07-30 DIAGNOSIS — K219 Gastro-esophageal reflux disease without esophagitis: Secondary | ICD-10-CM | POA: Insufficient documentation

## 2023-07-30 DIAGNOSIS — F32A Depression, unspecified: Secondary | ICD-10-CM | POA: Diagnosis not present

## 2023-07-30 DIAGNOSIS — K2970 Gastritis, unspecified, without bleeding: Secondary | ICD-10-CM | POA: Diagnosis not present

## 2023-07-30 DIAGNOSIS — R1310 Dysphagia, unspecified: Secondary | ICD-10-CM | POA: Insufficient documentation

## 2023-07-30 DIAGNOSIS — Z7982 Long term (current) use of aspirin: Secondary | ICD-10-CM | POA: Insufficient documentation

## 2023-07-30 DIAGNOSIS — K222 Esophageal obstruction: Secondary | ICD-10-CM | POA: Insufficient documentation

## 2023-07-30 DIAGNOSIS — F17200 Nicotine dependence, unspecified, uncomplicated: Secondary | ICD-10-CM | POA: Diagnosis not present

## 2023-07-30 DIAGNOSIS — Z79899 Other long term (current) drug therapy: Secondary | ICD-10-CM | POA: Insufficient documentation

## 2023-07-30 DIAGNOSIS — I5030 Unspecified diastolic (congestive) heart failure: Secondary | ICD-10-CM | POA: Diagnosis not present

## 2023-07-30 DIAGNOSIS — I509 Heart failure, unspecified: Secondary | ICD-10-CM | POA: Insufficient documentation

## 2023-07-30 DIAGNOSIS — G40909 Epilepsy, unspecified, not intractable, without status epilepticus: Secondary | ICD-10-CM | POA: Diagnosis not present

## 2023-07-30 DIAGNOSIS — I110 Hypertensive heart disease with heart failure: Secondary | ICD-10-CM | POA: Insufficient documentation

## 2023-07-30 DIAGNOSIS — Z87891 Personal history of nicotine dependence: Secondary | ICD-10-CM | POA: Insufficient documentation

## 2023-07-30 DIAGNOSIS — K3189 Other diseases of stomach and duodenum: Secondary | ICD-10-CM | POA: Diagnosis not present

## 2023-07-30 DIAGNOSIS — E785 Hyperlipidemia, unspecified: Secondary | ICD-10-CM | POA: Diagnosis not present

## 2023-07-30 DIAGNOSIS — Z7902 Long term (current) use of antithrombotics/antiplatelets: Secondary | ICD-10-CM | POA: Insufficient documentation

## 2023-07-30 HISTORY — PX: ESOPHAGOGASTRODUODENOSCOPY (EGD) WITH PROPOFOL: SHX5813

## 2023-07-30 HISTORY — PX: ESOPHAGEAL DILATION: SHX303

## 2023-07-30 SURGERY — ESOPHAGOGASTRODUODENOSCOPY (EGD) WITH PROPOFOL
Anesthesia: General

## 2023-07-30 MED ORDER — PROPOFOL 10 MG/ML IV BOLUS
INTRAVENOUS | Status: DC | PRN
  Administered 2023-07-30: 40 mg via INTRAVENOUS
  Administered 2023-07-30: 10 mg via INTRAVENOUS

## 2023-07-30 MED ORDER — LIDOCAINE HCL (CARDIAC) PF 100 MG/5ML IV SOSY
PREFILLED_SYRINGE | INTRAVENOUS | Status: DC | PRN
  Administered 2023-07-30: 60 mg via INTRAVENOUS

## 2023-07-30 MED ORDER — EPHEDRINE SULFATE-NACL 50-0.9 MG/10ML-% IV SOSY
PREFILLED_SYRINGE | INTRAVENOUS | Status: DC | PRN
  Administered 2023-07-30 (×2): 10 mg via INTRAVENOUS

## 2023-07-30 MED ORDER — PROPOFOL 500 MG/50ML IV EMUL
INTRAVENOUS | Status: DC | PRN
Start: 2023-07-30 — End: 2023-07-30
  Administered 2023-07-30: 145 ug/kg/min via INTRAVENOUS

## 2023-07-30 MED ORDER — SODIUM CHLORIDE 0.9 % IV SOLN: INTRAVENOUS | Status: DC

## 2023-07-30 MED ORDER — SODIUM CHLORIDE 0.9 % IV SOLN: INTRAVENOUS | Status: DC | PRN

## 2023-07-30 MED ORDER — GLYCOPYRROLATE 0.2 MG/ML IJ SOLN
INTRAMUSCULAR | Status: DC | PRN
  Administered 2023-07-30: .2 mg via INTRAVENOUS

## 2023-07-30 NOTE — Anesthesia Preprocedure Evaluation (Signed)
Anesthesia Evaluation  Patient identified by MRN, date of birth, ID band Patient awake    Reviewed: Allergy & Precautions, NPO status , Patient's Chart, lab work & pertinent test results  Airway Mallampati: III  TM Distance: >3 FB Neck ROM: full    Dental  (+) Chipped, Dental Advidsory Given, Poor Dentition   Pulmonary neg shortness of breath, neg recent URI, Current Smoker and Patient abstained from smoking., former smoker   Pulmonary exam normal        Cardiovascular hypertension, (-) angina +CHF  (-) Past MI and (-) Cardiac Stents Normal cardiovascular exam(-) dysrhythmias (-) Valvular Problems/Murmurs     Neuro/Psych Seizures -, Well Controlled,  PSYCHIATRIC DISORDERS  Depression    CVA    GI/Hepatic Neg liver ROS,GERD  Medicated and Controlled,,  Endo/Other  negative endocrine ROS    Renal/GU negative Renal ROS  negative genitourinary   Musculoskeletal   Abdominal   Peds  Hematology negative hematology ROS (+)   Anesthesia Other Findings Past Medical History: No date: Herpes zoster No date: Hyperlipemia No date: Hypertension No date: Intestinal polyp     Comment:  hyperplastic No date: Prostate cancer (HCC) 12/12/2014 through 08/24/ 2016 : S/P  radiation therapy     Comment:  Prostate 7800 cGy in 40 sessions, seminal vesicles 5600               cGy in 40 sessions  No date: Seizure disorder [facility])  Past Surgical History: No date: Carotid Artery Catherization 06/27/2014: PROSTATE BIOPSY  BMI    Body Mass Index: 23.97 kg/m      Reproductive/Obstetrics negative OB ROS                             Anesthesia Physical Anesthesia Plan  ASA: 3  Anesthesia Plan: General   Post-op Pain Management: Minimal or no pain anticipated   Induction: Intravenous  PONV Risk Score and Plan: 2 and Propofol infusion  and TIVA  Airway Management Planned: Nasal Cannula and Natural Airway  Additional Equipment: None  Intra-op Plan:   Post-operative Plan:   Informed Consent: I have reviewed the patients History and Physical, chart, labs and discussed the procedure including the risks, benefits and alternatives for the proposed anesthesia with the patient or authorized representative who has indicated his/her understanding and acceptance.     Dental advisory given  Plan Discussed with: CRNA and Surgeon  Anesthesia Plan Comments: (Discussed risks of anesthesia with patient, including possibility of difficulty with spontaneous ventilation under anesthesia necessitating airway intervention, PONV, and rare risks such as cardiac or respiratory or neurological events, and allergic reactions. Discussed the role of CRNA in patient's perioperative care. Patient understands.)       Anesthesia Quick Evaluation

## 2023-07-30 NOTE — H&P (Signed)
Pre-Procedure H&P   Patient ID: Carlos Wise is a 83 y.o. male.  Gastroenterology Provider: [name], DO  Referring Provider: Jacob Moores, PA PCP: Ailene Ravel, MD  Date: 07/30/2023  HPI [name] is a 83 y.o. male who presents today for Esophagogastroduodenoscopy for dysphagia, esophageal stricture.  Patient undergoing EGD with possible dilation for solid food dysphagia.  He feels sticking at the suprasternal notch.  No regurgitation.  He does note some epigastric discomfort. He underwent an upper GI study demonstrated a proximal esophageal stricture and suspicion for gastritis  Hemoglobin 12.1 MCV 90 platelets 243,000 sodium 127 creatinine 0.7  Patient's Plavix has been held for the last 5 days   Past Medical History:  Diagnosis Date   Herpes zoster    Hyperlipemia    Hypertension    Intestinal polyp    hyperplastic   Prostate cancer (HCC)    S/P radiation therapy 12/12/2014 through 02/06/2015                                                      Prostate 7800 cGy in 40 sessions, seminal vesicles 5600 cGy in 40 sessions                           Seizure disorder [facility])     Past Surgical History:  Procedure Laterality Date   Carotid Artery Catherization     COLONOSCOPY     PROSTATE BIOPSY  06/27/2014    Family History Mother- crc No other h/o GI disease or malignancy  Review of Systems  Constitutional:  Negative for activity change, appetite change, chills, diaphoresis, fatigue, fever and unexpected weight change.  HENT:  Positive for trouble swallowing. Negative for voice change.   Respiratory:  Negative for shortness of breath and wheezing.   Cardiovascular:  Negative for chest pain, palpitations and leg swelling.  Gastrointestinal:  Negative for abdominal distention, abdominal pain, anal bleeding, blood in stool, constipation, diarrhea, nausea and vomiting.  Musculoskeletal:  Negative for arthralgias and myalgias.  Skin:  Negative for  color change and pallor.  Neurological:  Negative for dizziness, syncope and weakness.  Psychiatric/Behavioral:  Negative for confusion. The patient is not nervous/anxious.   All other systems reviewed and are negative.    Medications No current facility-administered medications on file prior to encounter.   Current Outpatient Medications on File Prior to Encounter  Medication Sig Dispense Refill   atorvastatin (LIPITOR) 20 MG tablet Take 10 mg by mouth daily.     busPIRone (BUSPAR) 15 MG tablet Take 15 mg by mouth 2 (two) times daily.     Cyanocobalamin (VITAMIN B-12 PO) Take 1 tablet by mouth daily.     folic acid (FOLVITE) 1 MG tablet Take 1 mg by mouth daily.     gabapentin (NEURONTIN) 300 MG capsule Take 300 mg by mouth at bedtime.     lamoTRIgine (LAMICTAL) 150 MG tablet Take 1 tablet (150 mg total) by mouth 2 (two) times daily. 180 tablet 3   tamsulosin (FLOMAX) 0.4 MG CAPS capsule Take 0.4 mg by mouth.     thiamine (VITAMIN B1) 100 MG tablet Take 250 mg by mouth daily.     aspirin EC 81 MG tablet Take 81 mg by mouth daily. Swallow whole. (Patient  not taking: Reported on 10/15/2022)     Cholecalciferol (VITAMIN D3 PO) Take 1 capsule by mouth daily.     clobetasol cream (TEMOVATE) 0.05 % Apply 1 Application topically daily as needed (itching scalp).     clopidogrel (PLAVIX) 75 MG tablet TAKE 1 TABLET BY MOUTH EVERY DAY 90 tablet 1   fluticasone (FLONASE) 50 MCG/ACT nasal spray Place 1 spray into both nostrils daily as needed for allergies.     ketotifen (ALLERGY EYE DROPS) 0.025 % ophthalmic solution Place 1 drop into both eyes daily as needed (red eyes).     pantoprazole (PROTONIX) 40 MG tablet Take 40 mg by mouth daily.     Pseudoeph-Doxylamine-DM-APAP (NYQUIL PO) Take 2 capsules by mouth at bedtime as needed (sleep when aleve pm is no working). (Patient not taking: Reported on 10/15/2022)     sodium chloride (OCEAN) 0.65 % SOLN nasal spray Place 1 spray into both nostrils daily as  needed for congestion.     sodium chloride 1 g tablet Take 1 g by mouth 3 (three) times daily.     triamcinolone cream (KENALOG) 0.1 % Apply 1 Application topically daily as needed (itching skin).      Pertinent medications related to GI and procedure were reviewed by me with the patient prior to the procedure   Current Facility-Administered Medications:    0.9 %  sodium chloride infusion, , Intravenous, Continuous, [name], DO, Last Rate: 20 mL/hr at 07/30/23 1240, New Bag at 07/30/23 1240  sodium chloride 20 mL/hr at 07/30/23 1240       Allergies  Allergen Reactions   Oxcarbazepine     Significant and severe hyponatremia noted within a few days of starting Oxcarbazepine.   Penicillins Other (See Comments)    Patient states no allergy, but has preference that he does not want this medication   Allergies were reviewed by me prior to the procedure  Objective   Body mass index is 23.75 kg/m. Vitals:   07/30/23 1237  BP: (!) 159/82  Pulse: 87  Resp: 16  Temp: 97.7 F (36.5 C)  TempSrc: Temporal  SpO2: 99%  Weight: 72.9 kg  Height: 5\' 9"  (1.753 m)     Physical Exam Vitals and nursing note reviewed.  Constitutional:      General: He is not in acute distress.    Appearance: Normal appearance. He is not ill-appearing, toxic-appearing or diaphoretic.  HENT:     Head: Normocephalic and atraumatic.     Nose: Nose normal.     Mouth/Throat:     Mouth: Mucous membranes are moist.     Pharynx: Oropharynx is clear.  Eyes:     General: No scleral icterus.    Extraocular Movements: Extraocular movements intact.  Cardiovascular:     Rate and Rhythm: Normal rate and regular rhythm.     Heart sounds: Normal heart sounds. No murmur heard.    No friction rub. No gallop.  Pulmonary:     Effort: Pulmonary effort is normal. No respiratory distress.     Breath sounds: Normal breath sounds. No wheezing, rhonchi or rales.  Abdominal:     General: Bowel sounds are  normal. There is no distension.     Palpations: Abdomen is soft.     Tenderness: There is no abdominal tenderness. There is no guarding or rebound.  Musculoskeletal:     Cervical back: Neck supple.     Right lower leg: No edema.     Left lower leg: No  edema.  Skin:    General: Skin is warm and dry.     Coloration: Skin is not jaundiced or pale.  Neurological:     General: No focal deficit present.     Mental Status: He is alert and oriented to person, place, and time. Mental status is at baseline.  Psychiatric:        Mood and Affect: Mood normal.        Behavior: Behavior normal.        Thought Content: Thought content normal.        Judgment: Judgment normal.      Assessment:  [name] is a 83 y.o. male  who presents today for Esophagogastroduodenoscopy for dysphagia, esophageal stricture.  Plan:  Esophagogastroduodenoscopy with possible intervention today  Esophagogastroduodenoscopy with possible biopsy, control of bleeding, polypectomy, and interventions as necessary has been discussed with the patient/patient representative. Informed consent was obtained from the patient/patient representative after explaining the indication, nature, and risks of the procedure including but not limited to death, bleeding, perforation, missed neoplasm/lesions, cardiorespiratory compromise, and reaction to medications. Opportunity for questions was given and appropriate answers were provided. Patient/patient representative has verbalized understanding is amenable to undergoing the procedure.   [name], DO  [facility]  Portions of the record may have been created with voice recognition software. Occasional wrong-word or 'sound-a-like' substitutions may have occurred due to the inherent limitations of voice recognition software.  Read the chart carefully and recognize, using context, where substitutions may have occurred.

## 2023-07-30 NOTE — Anesthesia Postprocedure Evaluation (Signed)
Anesthesia Post Note  Patient: Carlos Wise  Procedure(s) Performed: ESOPHAGOGASTRODUODENOSCOPY (EGD) WITH PROPOFOL ESOPHAGEAL DILATION  Patient location during evaluation: Endoscopy Anesthesia Type: General Level of consciousness: awake and alert Pain management: pain level controlled Vital Signs Assessment: post-procedure vital signs reviewed and stable Respiratory status: spontaneous breathing, nonlabored ventilation, respiratory function stable and patient connected to nasal cannula oxygen Cardiovascular status: blood pressure returned to baseline and stable Postop Assessment: no apparent nausea or vomiting Anesthetic complications: no   No notable events documented.   Last Vitals:  Vitals:   07/30/23 1324 07/30/23 1334  BP: (!) 89/59 123/73  Pulse: 94 91  Resp: 17 16  Temp:    SpO2: 97% 97%    Last Pain:  Vitals:   07/30/23 1334  TempSrc:   PainSc: 0-No pain                 Lenard Simmer

## 2023-07-30 NOTE — Transfer of Care (Signed)
Immediate Anesthesia Transfer of Care Note  Patient: Carlos Wise  Procedure(s) Performed: ESOPHAGOGASTRODUODENOSCOPY (EGD) WITH PROPOFOL ESOPHAGEAL DILATION  Patient Location: Endoscopy Unit  Anesthesia Type:General  Level of Consciousness: awake, drowsy, and patient cooperative  Airway & Oxygen Therapy: Patient Spontanous Breathing and Patient connected to face mask oxygen  Post-op Assessment: Report given to RN and Post -op Vital signs reviewed and stable  Post vital signs: Reviewed and stable  Last Vitals:  Vitals Value Taken Time  BP 99/56 07/30/23 1314  Temp 36.1 C 07/30/23 1314  Pulse 93 07/30/23 1315  Resp 17 07/30/23 1315  SpO2 100 % 07/30/23 1315  Vitals shown include unfiled device data.  Last Pain:  Vitals:   07/30/23 1314  TempSrc:   PainSc: Asleep         Complications: No notable events documented.

## 2023-07-30 NOTE — Op Note (Addendum)
[facility] Patient Name: Carlos Wise Procedure Date: 07/30/2023 12:27 PM MRN: [id number] Account #: [id number] Date of Birth: July 25, 1940 Admit Type: Outpatient Age: 83 Room: [facility] ENDO ROOM 1 Gender: Male Note Status: Supervisor Override Instrument Name: Upper Endoscope [id number] Procedure:             Upper GI endoscopy Indications:           stricture of the esophagus Providers:             [name] DO, DO Medicines:             Monitored Anesthesia Care Complications:         No immediate complications. Estimated blood loss:                         Minimal. Procedure:             Pre-Anesthesia Assessment:                        - Prior to the procedure, a History and Physical was                         performed, and patient medications and allergies were                         reviewed. The patient is competent. The risks and                         benefits of the procedure and the sedation options and                         risks were discussed with the patient. All questions                         were answered and informed consent was obtained.                         Patient identification and proposed procedure were                         verified by the physician, the nurse, the anesthetist                         and the technician in the endoscopy suite. Mental                         Status Examination: alert and oriented. Airway                         Examination: normal oropharyngeal airway and neck                         mobility. Respiratory Examination: clear to                         auscultation. CV Examination: RRR, no murmurs, no S3                         or S4. Prophylactic Antibiotics: The  patient does not                         require prophylactic antibiotics. Prior                         Anticoagulants: The patient has taken Plavix                         (clopidogrel), last dose was 5 days prior to                          procedure. ASA Grade Assessment: III - A patient with                         severe systemic disease. After reviewing the risks and                         benefits, the patient was deemed in satisfactory                         condition to undergo the procedure. The anesthesia                         plan was to use monitored anesthesia care (MAC).                         Immediately prior to administration of medications,                         the patient was re-assessed for adequacy to receive                         sedatives. The heart rate, respiratory rate, oxygen                         saturations, blood pressure, adequacy of pulmonary                         ventilation, and response to care were monitored                         throughout the procedure. The physical status of the                         patient was re-assessed after the procedure.                        After obtaining informed consent, the endoscope was                         passed under direct vision. Throughout the procedure,                         the patient's blood pressure, pulse, and oxygen                         saturations were monitored continuously. The Endoscope  was introduced through the mouth, and advanced to the                         second part of duodenum. The upper GI endoscopy was                         accomplished without difficulty. The patient tolerated                         the procedure well. Findings:      The duodenal bulb, first portion of the duodenum and second portion of       the duodenum were normal. Estimated blood loss: none.      Localized moderate inflammation characterized by erosions and erythema       was found in the gastric antrum. Biopsies were taken with a cold forceps       for Helicobacter pylori testing. Estimated blood loss was minimal.      The exam of the stomach was otherwise normal.      The Z-line  was regular. Estimated blood loss: none.      Esophagogastric landmarks were identified: the gastroesophageal junction       was found at 40 cm from the incisors.      One benign-appearing, intrinsic moderate (circumferential scarring or       stenosis; an endoscope may pass) stenosis was found 40 cm from the       incisors. This stenosis measured 1.1 cm (inner diameter) x less than one       cm (in length). The stenosis was traversed. A TTS dilator was passed       through the scope. Dilation with a 12-13.5-15 mm balloon dilator was       performed to 12 mm, 13.5 mm and 15 mm. The dilation site was examined       following endoscope reinsertion and showed mild mucosal disruption.       Estimated blood loss was minimal.      One benign-appearing, intrinsic moderate (circumferential scarring or       stenosis; an endoscope may pass) stenosis was found 18 cm from the       incisors. This stenosis measured 1 cm (inner diameter) x less than one       cm (in length). The stenosis was traversed. A TTS dilator was passed       through the scope. Dilation with an 01-21-09 mm balloon and a 03-26-11 mm       balloon dilator was performed to 8 mm, 9 mm, 10 mm, 11 mm and 12 mm. The       dilation site was examined following endoscope reinsertion and showed       mild improvement in luminal narrowing. Estimated blood loss: none.      The exam of the esophagus was otherwise normal. Impression:            - Normal duodenal bulb, first portion of the duodenum                         and second portion of the duodenum.                        - Gastritis. Biopsied.                        -  Z-line regular.                        - Esophagogastric landmarks identified.                        - Benign-appearing esophageal stenosis. Dilated.                        - Benign-appearing esophageal stenosis. Dilated. Recommendation:        - Patient has a contact number available for                          emergencies. The signs and symptoms of potential                         delayed complications were discussed with the patient.                         Return to normal activities tomorrow. Written                         discharge instructions were provided to the patient.                        - Discharge patient to home.                        - Soft diet.                        - Continue present medications.                        - Resume Plavix (clopidogrel) at prior dose tomorrow.                         Refer to managing physician for further adjustment of                         therapy.                        - No ibuprofen, naproxen, or other non-steroidal                         anti-inflammatory drugs for 5 days.                        - Await pathology results.                        - Repeat upper endoscopy 2-4 weeks for retreatment.                        - Will need to hold plavix 5 days prior to next                         procedure (depending when scheduled                        - Continue home pantoprazole                        -  The findings and recommendations were discussed with                         the patient.                        - The findings and recommendations were discussed with                         the designated responsible adult. Procedure Code(s):     --- Professional ---                        [phone], Esophagogastroduodenoscopy, flexible,                         transoral; with transendoscopic balloon dilation of                         esophagus (less than 30 mm diameter)                        43239, 59, Esophagogastroduodenoscopy, flexible,                         transoral; with biopsy, single or multiple Diagnosis Code(s):     --- Professional ---                        K29.70, Gastritis, unspecified, without bleeding                        K22.2, Esophageal obstruction                        R13.10, Dysphagia, unspecified CPT  copyright 2022 American Medical Association. All rights reserved. The codes documented in this report are preliminary and upon coder review may  be revised to meet current compliance requirements. Attending Participation:      I personally performed the entire procedure. Elfredia Nevins, DO [name] DO, DO 07/30/2023 1:27:04 PM This report has been signed electronically. Number of Addenda: 0 Note Initiated On: 07/30/2023 12:27 PM Estimated Blood Loss:  Estimated blood loss was minimal.      [facility]

## 2023-07-30 NOTE — Anesthesia Procedure Notes (Signed)
Procedure Name: General with mask airway Date/Time: 07/30/2023 12:49 PM  Performed by: Mohammed Kindle, CRNAPre-anesthesia Checklist: Patient identified, Emergency Drugs available, Suction available and Patient being monitored Patient Re-evaluated:Patient Re-evaluated prior to induction Oxygen Delivery Method: Simple face mask Induction Type: IV induction Placement Confirmation: positive ETCO2 and breath sounds checked- equal and bilateral Dental Injury: Teeth and Oropharynx as per pre-operative assessment

## 2023-07-30 NOTE — Interval H&P Note (Signed)
History and Physical Interval Note: Preprocedure H&P from 07/30/23  was reviewed and there was no interval change after seeing and examining the patient.  Written consent was obtained from the patient after discussion of risks, benefits, and alternatives. Patient has consented to proceed with Esophagogastroduodenoscopy with possible intervention   07/30/2023 12:44 PM  Carlos Wise  has presented today for surgery, with the diagnosis of K22.2 (ICD-10-CM) - Esophageal stricture K29.70 (ICD-10-CM) - Gastritis without bleeding, unspecified chronicity, unspecified gastritis type Z79.1 (ICD-10-CM) - NSAID long-term use F10.10 (ICD-10-CM) - ETOH abuse.  The various methods of treatment have been discussed with the patient and family. After consideration of risks, benefits and other options for treatment, the patient has consented to  Procedure(s): ESOPHAGOGASTRODUODENOSCOPY (EGD) WITH PROPOFOL (N/A) as a surgical intervention.  The patient's history has been reviewed, patient examined, no change in status, stable for surgery.  I have reviewed the patient's chart and labs.  Questions were answered to the patient's satisfaction.     [name]

## 2023-08-02 ENCOUNTER — Encounter: Payer: Self-pay | Admitting: Gastroenterology

## 2023-08-02 LAB — SURGICAL PATHOLOGY

## 2023-08-18 DIAGNOSIS — H6983 Other specified disorders of Eustachian tube, bilateral: Secondary | ICD-10-CM | POA: Diagnosis not present

## 2023-08-18 DIAGNOSIS — H6123 Impacted cerumen, bilateral: Secondary | ICD-10-CM | POA: Diagnosis not present

## 2023-08-20 ENCOUNTER — Encounter: Payer: Self-pay | Admitting: Gastroenterology

## 2023-08-26 ENCOUNTER — Encounter: Payer: Self-pay | Admitting: Gastroenterology

## 2023-08-27 ENCOUNTER — Ambulatory Visit: Admitting: General Practice

## 2023-08-27 ENCOUNTER — Encounter
Admission: RE | Disposition: A | Discharge status: Home / Self Care | Payer: Self-pay | Source: Home / Self Care | Attending: Gastroenterology

## 2023-08-27 ENCOUNTER — Other Ambulatory Visit: Payer: Self-pay

## 2023-08-27 ENCOUNTER — Encounter: Payer: Self-pay | Admitting: Gastroenterology

## 2023-08-27 ENCOUNTER — Ambulatory Visit
Admission: RE | Admit: 2023-08-27 | Discharge: 2023-08-27 | Disposition: A | Discharge status: Home / Self Care | Payer: Medicare HMO | Attending: Gastroenterology | Admitting: Gastroenterology

## 2023-08-27 DIAGNOSIS — Z7902 Long term (current) use of antithrombotics/antiplatelets: Secondary | ICD-10-CM | POA: Insufficient documentation

## 2023-08-27 DIAGNOSIS — R1310 Dysphagia, unspecified: Secondary | ICD-10-CM | POA: Diagnosis not present

## 2023-08-27 DIAGNOSIS — I509 Heart failure, unspecified: Secondary | ICD-10-CM | POA: Insufficient documentation

## 2023-08-27 DIAGNOSIS — K2950 Unspecified chronic gastritis without bleeding: Secondary | ICD-10-CM | POA: Diagnosis not present

## 2023-08-27 DIAGNOSIS — K222 Esophageal obstruction: Secondary | ICD-10-CM | POA: Diagnosis not present

## 2023-08-27 DIAGNOSIS — K219 Gastro-esophageal reflux disease without esophagitis: Secondary | ICD-10-CM | POA: Insufficient documentation

## 2023-08-27 DIAGNOSIS — K2970 Gastritis, unspecified, without bleeding: Secondary | ICD-10-CM | POA: Diagnosis not present

## 2023-08-27 DIAGNOSIS — K224 Dyskinesia of esophagus: Secondary | ICD-10-CM | POA: Diagnosis not present

## 2023-08-27 DIAGNOSIS — Z923 Personal history of irradiation: Secondary | ICD-10-CM | POA: Diagnosis not present

## 2023-08-27 DIAGNOSIS — I110 Hypertensive heart disease with heart failure: Secondary | ICD-10-CM | POA: Diagnosis not present

## 2023-08-27 DIAGNOSIS — Z87891 Personal history of nicotine dependence: Secondary | ICD-10-CM | POA: Insufficient documentation

## 2023-08-27 DIAGNOSIS — E785 Hyperlipidemia, unspecified: Secondary | ICD-10-CM | POA: Diagnosis not present

## 2023-08-27 HISTORY — PX: ESOPHAGOGASTRODUODENOSCOPY (EGD) WITH PROPOFOL: SHX5813

## 2023-08-27 HISTORY — PX: ESOPHAGEAL DILATION: SHX303

## 2023-08-27 SURGERY — ESOPHAGOGASTRODUODENOSCOPY (EGD) WITH PROPOFOL
Anesthesia: General

## 2023-08-27 MED ORDER — LIDOCAINE HCL (PF) 2 % IJ SOLN
INTRAMUSCULAR | Status: AC
  Filled 2023-08-27: qty 5

## 2023-08-27 MED ORDER — PROPOFOL 10 MG/ML IV BOLUS
INTRAVENOUS | Status: DC | PRN
  Administered 2023-08-27: 50 mg via INTRAVENOUS

## 2023-08-27 MED ORDER — SODIUM CHLORIDE 0.9 % IV SOLN: INTRAVENOUS | Status: DC

## 2023-08-27 MED ORDER — GLYCOPYRROLATE 0.2 MG/ML IJ SOLN
INTRAMUSCULAR | Status: DC | PRN
  Administered 2023-08-27: .2 mg via INTRAVENOUS

## 2023-08-27 MED ORDER — LIDOCAINE HCL (CARDIAC) PF 100 MG/5ML IV SOSY
PREFILLED_SYRINGE | INTRAVENOUS | Status: DC | PRN
  Administered 2023-08-27: 50 mg via INTRAVENOUS

## 2023-08-27 MED ORDER — DEXMEDETOMIDINE HCL IN NACL 80 MCG/20ML IV SOLN
INTRAVENOUS | Status: DC | PRN
  Administered 2023-08-27: 12 ug via INTRAVENOUS

## 2023-08-27 MED ORDER — GLYCOPYRROLATE 0.2 MG/ML IJ SOLN
INTRAMUSCULAR | Status: AC
  Filled 2023-08-27: qty 1

## 2023-08-27 MED ORDER — PROPOFOL 500 MG/50ML IV EMUL
INTRAVENOUS | Status: DC | PRN
  Administered 2023-08-27: 50 ug/kg/min via INTRAVENOUS

## 2023-08-27 NOTE — Anesthesia Preprocedure Evaluation (Signed)
 Anesthesia Evaluation  Patient identified by MRN, date of birth, ID band Patient awake    Reviewed: Allergy & Precautions, NPO status , Patient's Chart, lab work & pertinent test results  Airway Mallampati: III  TM Distance: >3 FB Neck ROM: full    Dental  (+) Chipped, Dental Advidsory Given, Poor Dentition   Pulmonary neg shortness of breath, neg recent URI, Current Smoker and Patient abstained from smoking., former smoker   Pulmonary exam normal        Cardiovascular hypertension, (-) angina +CHF  (-) Past MI and (-) Cardiac Stents Normal cardiovascular exam(-) dysrhythmias (-) Valvular Problems/Murmurs     Neuro/Psych Seizures -, Well Controlled,  PSYCHIATRIC DISORDERS  Depression    CVA    GI/Hepatic Neg liver ROS,GERD  Medicated and Controlled,,  Endo/Other  negative endocrine ROS    Renal/GU negative Renal ROS  negative genitourinary   Musculoskeletal   Abdominal   Peds  Hematology negative hematology ROS (+)   Anesthesia Other Findings Past Medical History: No date: Herpes zoster No date: Hyperlipemia No date: Hypertension No date: Intestinal polyp     Comment:  hyperplastic No date: Prostate cancer (HCC) 12/12/2014 through 08/24/ 2016 : S/P  radiation therapy     Comment:  Prostate 7800 cGy in 40 sessions, seminal vesicles 5600               cGy in 40 sessions  No date: Seizure disorder [facility])  Past Surgical History: No date: Carotid Artery Catherization 06/27/2014: PROSTATE BIOPSY  BMI    Body Mass Index: 23.97 kg/m      Reproductive/Obstetrics negative OB ROS                             Anesthesia Physical Anesthesia Plan  ASA: 3  Anesthesia Plan: General   Post-op Pain Management: Minimal or no pain anticipated   Induction: Intravenous  PONV Risk Score and Plan: 2 and Propofol infusion  and TIVA  Airway Management Planned: Nasal Cannula and Natural Airway  Additional Equipment: None  Intra-op Plan:   Post-operative Plan:   Informed Consent: I have reviewed the patients History and Physical, chart, labs and discussed the procedure including the risks, benefits and alternatives for the proposed anesthesia with the patient or authorized representative who has indicated his/her understanding and acceptance.     Dental advisory given  Plan Discussed with: CRNA and Surgeon  Anesthesia Plan Comments: (Discussed risks of anesthesia with patient, including possibility of difficulty with spontaneous ventilation under anesthesia necessitating airway intervention, PONV, and rare risks such as cardiac or respiratory or neurological events, and allergic reactions. Discussed the role of CRNA in patient's perioperative care. Patient understands.)       Anesthesia Quick Evaluation

## 2023-08-27 NOTE — H&P (Signed)
 Pre-Procedure H&P   Patient ID: Carlos Wise is a 83 y.o. male.  Gastroenterology Provider: [name], DO  PCP: Ailene Ravel, MD  Date: 08/27/2023  HPI [name] is a 83 y.o. male who presents today for Esophagogastroduodenoscopy for esophageal stricture, dysphagia .  Patient with a history of dysphagia and esophageal stricture.  Last underwent EGD on February 14 where 2 esophageal strictures were dilated.  1 at 40 cm from the incisors was dilated to 15 mm with TTS balloon with mild disruption.  Another stricture was found 18 cm from the incisors dilated to 12 mm with mild disruption.  Feels his dysphagia is slightly better after dilation.  Plavix has been held since March 4  Most recent lab work: Hemoglobin 12.1 MCV 90 platelets 243,000 sodium 127 creatinine 0.7   Past Medical History:  Diagnosis Date   Herpes zoster    Hyperlipemia    Hypertension    Intestinal polyp    hyperplastic   Prostate cancer (HCC)    S/P radiation therapy 12/12/2014 through 02/06/2015                                                      Prostate 7800 cGy in 40 sessions, seminal vesicles 5600 cGy in 40 sessions                           Seizure disorder [facility])     Past Surgical History:  Procedure Laterality Date   Carotid Artery Catherization     COLONOSCOPY     ESOPHAGEAL DILATION  07/30/2023   Procedure: ESOPHAGEAL DILATION;  Surgeon: [name], DO;  Location: [facility] ENDOSCOPY;  Service: Gastroenterology;;   ESOPHAGOGASTRODUODENOSCOPY (EGD) WITH PROPOFOL N/A 07/30/2023   Procedure: ESOPHAGOGASTRODUODENOSCOPY (EGD) WITH PROPOFOL;  Surgeon: [name], DO;  Location: [facility] ENDOSCOPY;  Service: Gastroenterology;  Laterality: N/A;   PROSTATE BIOPSY  06/27/2014    Family History No h/o GI disease or malignancy  Review of Systems  Constitutional:  Negative for activity change, appetite change, chills, diaphoresis, fatigue, fever and unexpected  weight change.  HENT:  Positive for trouble swallowing. Negative for voice change.   Respiratory:  Negative for shortness of breath and wheezing.   Cardiovascular:  Negative for chest pain, palpitations and leg swelling.  Gastrointestinal:  Negative for abdominal distention, abdominal pain, anal bleeding, blood in stool, constipation, diarrhea, nausea and vomiting.  Musculoskeletal:  Negative for arthralgias and myalgias.  Skin:  Negative for color change and pallor.  Neurological:  Negative for dizziness, syncope and weakness.  Psychiatric/Behavioral:  Negative for confusion. The patient is not nervous/anxious.   All other systems reviewed and are negative.    Medications No current facility-administered medications on file prior to encounter.   Current Outpatient Medications on File Prior to Encounter  Medication Sig Dispense Refill   atorvastatin (LIPITOR) 20 MG tablet Take 10 mg by mouth daily.     busPIRone (BUSPAR) 15 MG tablet Take 15 mg by mouth 2 (two) times daily.     Cholecalciferol (VITAMIN D3 PO) Take 1 capsule by mouth daily.     clopidogrel (PLAVIX) 75 MG tablet TAKE 1 TABLET BY MOUTH EVERY DAY 90 tablet 1   Cyanocobalamin (VITAMIN B-12 PO) Take 1 tablet by mouth  daily.     fluticasone (FLONASE) 50 MCG/ACT nasal spray Place 1 spray into both nostrils daily as needed for allergies.     folic acid (FOLVITE) 1 MG tablet Take 1 mg by mouth daily.     gabapentin (NEURONTIN) 300 MG capsule Take 300 mg by mouth at bedtime.     ketotifen (ALLERGY EYE DROPS) 0.025 % ophthalmic solution Place 1 drop into both eyes daily as needed (red eyes).     lamoTRIgine (LAMICTAL) 150 MG tablet Take 1 tablet (150 mg total) by mouth 2 (two) times daily. 180 tablet 3   pantoprazole (PROTONIX) 40 MG tablet Take 40 mg by mouth daily.     sodium chloride (OCEAN) 0.65 % SOLN nasal spray Place 1 spray into both nostrils daily as needed for congestion.     sodium chloride 1 g tablet Take 1 g by mouth 3  (three) times daily.     tamsulosin (FLOMAX) 0.4 MG CAPS capsule Take 0.4 mg by mouth.     thiamine (VITAMIN B1) 100 MG tablet Take 250 mg by mouth daily.     triamcinolone cream (KENALOG) 0.1 % Apply 1 Application topically daily as needed (itching skin).     aspirin EC 81 MG tablet Take 81 mg by mouth daily. Swallow whole. (Patient not taking: Reported on 10/15/2022)     clobetasol cream (TEMOVATE) 0.05 % Apply 1 Application topically daily as needed (itching scalp).     Pseudoeph-Doxylamine-DM-APAP (NYQUIL PO) Take 2 capsules by mouth at bedtime as needed (sleep when aleve pm is no working). (Patient not taking: Reported on 10/15/2022)      Pertinent medications related to GI and procedure were reviewed by me with the patient prior to the procedure   Current Facility-Administered Medications:    0.9 %  sodium chloride infusion, , Intravenous, Continuous, [name], DO, Last Rate: 20 mL/hr at 08/27/23 1002, New Bag at 08/27/23 1002  Facility-Administered Medications Ordered in Other Encounters:    glycopyrrolate (ROBINUL) injection, , Intravenous, Anesthesia Intra-op, Deland Pretty, CRNA, 0.2 mg at 08/27/23 1002  sodium chloride 20 mL/hr at 08/27/23 1002       Allergies  Allergen Reactions   Oxcarbazepine     Significant and severe hyponatremia noted within a few days of starting Oxcarbazepine.   Penicillins Other (See Comments)    Patient states no allergy, but has preference that he does not want this medication   Allergies were reviewed by me prior to the procedure  Objective   Body mass index is 23.51 kg/m. Vitals:   08/27/23 1004  BP: (!) 175/82  Pulse: 71  Resp: 17  Temp: (!) 97.3 F (36.3 C)  TempSrc: Temporal  SpO2: 99%  Weight: 72.2 kg  Height: 5\' 9"  (1.753 m)     Physical Exam Vitals and nursing note reviewed.  Constitutional:      General: He is not in acute distress.    Appearance: Normal appearance. He is not ill-appearing, toxic-appearing  or diaphoretic.  HENT:     Head: Normocephalic and atraumatic.     Ears:     Comments: HOH    Nose: Nose normal.     Mouth/Throat:     Mouth: Mucous membranes are moist.     Pharynx: Oropharynx is clear.  Eyes:     General: No scleral icterus.    Extraocular Movements: Extraocular movements intact.  Cardiovascular:     Rate and Rhythm: Normal rate and regular rhythm.     Heart  sounds: Normal heart sounds. No murmur heard.    No friction rub. No gallop.  Pulmonary:     Effort: Pulmonary effort is normal. No respiratory distress.     Breath sounds: Normal breath sounds. No wheezing, rhonchi or rales.  Abdominal:     General: Bowel sounds are normal. There is no distension.     Palpations: Abdomen is soft.     Tenderness: There is no abdominal tenderness. There is no guarding or rebound.  Musculoskeletal:     Cervical back: Neck supple.     Right lower leg: No edema.     Left lower leg: No edema.  Skin:    General: Skin is warm and dry.     Coloration: Skin is not jaundiced or pale.  Neurological:     General: No focal deficit present.     Mental Status: He is alert and oriented to person, place, and time. Mental status is at baseline.  Psychiatric:        Mood and Affect: Mood normal.        Behavior: Behavior normal.        Thought Content: Thought content normal.        Judgment: Judgment normal.      Assessment:  [name] is a 83 y.o. male  who presents today for Esophagogastroduodenoscopy for esophageal stricture, dysphagia .  Plan:  Esophagogastroduodenoscopy with possible intervention today  Esophagogastroduodenoscopy with possible biopsy, control of bleeding, polypectomy, and interventions as necessary has been discussed with the patient/patient representative. Informed consent was obtained from the patient/patient representative after explaining the indication, nature, and risks of the procedure including but not limited to death, bleeding, perforation,  missed neoplasm/lesions, cardiorespiratory compromise, and reaction to medications. Opportunity for questions was given and appropriate answers were provided. Patient/patient representative has verbalized understanding is amenable to undergoing the procedure.   [name], DO  [facility] Gastroenterology  Portions of the record may have been created with voice recognition software. Occasional wrong-word or 'sound-a-like' substitutions may have occurred due to the inherent limitations of voice recognition software.  Read the chart carefully and recognize, using context, where substitutions may have occurred.

## 2023-08-27 NOTE — Op Note (Signed)
 [facility] Gastroenterology Patient Name: Carlos Wise Procedure Date: 08/27/2023 9:49 AM MRN: [id number] Account #: [id number] Date of Birth: 1941/01/27 Admit Type: Outpatient Age: 83 Room: [facility] ENDO ROOM 1 Gender: Male Note Status: Finalized Instrument Name: Upper Endoscope [id number] Procedure:             Upper GI endoscopy Indications:           Dysphagia, For therapy of esophageal stricture Providers:             [name] DO, DO Referring MD:          Durward Fortes. Hamrick (Referring MD) Medicines:             Monitored Anesthesia Care Complications:         No immediate complications. Estimated blood loss:                         Minimal. Procedure:             Pre-Anesthesia Assessment:                        - Prior to the procedure, a History and Physical was                         performed, and patient medications and allergies were                         reviewed. The patient is competent. The risks and                         benefits of the procedure and the sedation options and                         risks were discussed with the patient. All questions                         were answered and informed consent was obtained.                         Patient identification and proposed procedure were                         verified by the physician, the nurse, the anesthetist                         and the technician in the endoscopy suite. Mental                         Status Examination: alert and oriented. Airway                         Examination: normal oropharyngeal airway and neck                         mobility. Respiratory Examination: clear to                         auscultation. CV Examination: RRR, no murmurs, no S3  or S4. Prophylactic Antibiotics: The patient does not                         require prophylactic antibiotics. Prior                         Anticoagulants: The patient has taken Plavix                          (clopidogrel), last dose was 10 days prior to                         procedure. ASA Grade Assessment: III - A patient with                         severe systemic disease. After reviewing the risks and                         benefits, the patient was deemed in satisfactory                         condition to undergo the procedure. The anesthesia                         plan was to use monitored anesthesia care (MAC).                         Immediately prior to administration of medications,                         the patient was re-assessed for adequacy to receive                         sedatives. The heart rate, respiratory rate, oxygen                         saturations, blood pressure, adequacy of pulmonary                         ventilation, and response to care were monitored                         throughout the procedure. The physical status of the                         patient was re-assessed after the procedure.                        After obtaining informed consent, the endoscope was                         passed under direct vision. Throughout the procedure,                         the patient's blood pressure, pulse, and oxygen                         saturations were monitored continuously. The Endoscope  was introduced through the mouth, and advanced to the                         second part of duodenum. The upper GI endoscopy was                         accomplished without difficulty. The patient tolerated                         the procedure well. Findings:      The duodenal bulb, first portion of the duodenum and second portion of       the duodenum were normal. Estimated blood loss: none.      Localized mild inflammation characterized by erythema and granularity       was found on the lesser curvature of the stomach and in the gastric       antrum. biopsies taken during procedure last month. 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      The exam of the stomach was otherwise normal.      Esophagogastric landmarks were identified: the gastroesophageal junction       was found at 40 cm from the incisors.      One benign-appearing, intrinsic moderate (circumferential scarring or       stenosis; an endoscope may pass) stenosis was found 40 cm from the       incisors. This stenosis measured 1.4 cm (inner diameter) x less than one       cm (in length). The stenosis was traversed. A TTS dilator was passed       through the scope. Dilation with a 15-16.5-18 mm balloon dilator was       performed to 15 mm and 16.5 mm. The dilation site was examined following       endoscope reinsertion and showed mild mucosal disruption. mild       disruption occurred with 16.74mm balloon Estimated blood loss was minimal.      No stricture was appreciated in upper esophagus on this procedure. He       did have a fair amount of spasm which is likely contributing to his       dysphagia. Estimated blood loss: none.      Abnormal motility was noted in the esophagus. The cricopharyngeus was       normal. There is spasticity of the esophageal body. The distal       esophagus/lower esophageal sphincter is open. Estimated blood loss: none.      The exam of the esophagus was otherwise normal. Impression:            - Normal duodenal bulb, first portion of the duodenum                         and second portion of the duodenum.                        - Gastritis.                        - Esophagogastric landmarks identified.                        - Benign-appearing esophageal stenosis. Dilated.                        -  Abnormal esophageal motility, suspicious for                         presbyesophagus.                        - No specimens collected. Recommendation:        - Patient has a contact number available for                         emergencies. The signs and symptoms of potential                          delayed complications were discussed with the patient.                         Return to normal activities tomorrow. Written                         discharge instructions were provided to the patient.                        - Discharge patient to home.                        - Soft diet today.                        - Continue present medications.                        - Resume Plavix (clopidogrel) at prior dose in 2 days.                         Refer to managing physician for further adjustment of                         therapy.                        - No ibuprofen, naproxen, or other non-steroidal                         anti-inflammatory drugs for 5 days after polyp removal.                        - Repeat upper endoscopy PRN for retreatment.                        - Return to GI office as previously scheduled.                        - The findings and recommendations were discussed with                         the patient.                        - The findings and recommendations were discussed with  the designated responsible adult. Procedure Code(s):     --- Professional ---                        [phone], Esophagogastroduodenoscopy, flexible,                         transoral; with transendoscopic balloon dilation of                         esophagus (less than 30 mm diameter) Diagnosis Code(s):     --- Professional ---                        K29.70, Gastritis, unspecified, without bleeding                        K22.2, Esophageal obstruction                        K22.4, Dyskinesia of esophagus                        R13.10, Dysphagia, unspecified CPT copyright 2022 American Medical Association. All rights reserved. The codes documented in this report are preliminary and upon coder review may  be revised to meet current compliance requirements. Attending Participation:      I personally performed the entire procedure. Elfredia Nevins, DO [name] DO, DO 08/27/2023 10:33:33 AM This report has been signed electronically. Number of Addenda: 0 Note Initiated On: 08/27/2023 9:49 AM Estimated Blood Loss:  Estimated blood loss was minimal.      [facility]

## 2023-08-27 NOTE — Transfer of Care (Signed)
 Immediate Anesthesia Transfer of Care Note  Patient: Carlos Wise  Procedure(s) Performed: ESOPHAGOGASTRODUODENOSCOPY (EGD) WITH PROPOFOL DILATION, ESOPHAGUS  Patient Location: PACU  Anesthesia Type:General  Level of Consciousness: sedated  Airway & Oxygen Therapy: Patient Spontanous Breathing  Post-op Assessment: Report given to RN and Post -op Vital signs reviewed and stable  Post vital signs: Reviewed and stable  Last Vitals:  Vitals Value Taken Time  BP 118/69 08/27/23 1022  Temp 35.9 C 08/27/23 1022  Pulse 69 08/27/23 1022  Resp 17 08/27/23 1022  SpO2 98 % 08/27/23 1022    Last Pain:  Vitals:   08/27/23 1022  TempSrc: Temporal  PainSc: Asleep         Complications: No notable events documented.

## 2023-08-27 NOTE — Anesthesia Postprocedure Evaluation (Signed)
 Anesthesia Post Note  Patient: Carlos Wise  Procedure(s) Performed: ESOPHAGOGASTRODUODENOSCOPY (EGD) WITH PROPOFOL DILATION, ESOPHAGUS  Patient location during evaluation: Endoscopy Anesthesia Type: General Level of consciousness: awake and alert Pain management: pain level controlled Vital Signs Assessment: post-procedure vital signs reviewed and stable Respiratory status: spontaneous breathing, nonlabored ventilation, respiratory function stable and patient connected to nasal cannula oxygen Cardiovascular status: blood pressure returned to baseline and stable Postop Assessment: no apparent nausea or vomiting Anesthetic complications: no  No notable events documented.   Last Vitals:  Vitals:   08/27/23 1004 08/27/23 1022  BP: (!) 175/82 118/69  Pulse: 71 69  Resp: 17 17  Temp: (!) 36.3 C (!) 35.9 C  SpO2: 99% 98%    Last Pain:  Vitals:   08/27/23 1022  TempSrc: Temporal  PainSc: Asleep                 Stephanie Coup

## 2023-08-27 NOTE — Interval H&P Note (Signed)
 History and Physical Interval Note: Preprocedure H&P from 08/27/23  was reviewed and there was no interval change after seeing and examining the patient.  Written consent was obtained from the patient after discussion of risks, benefits, and alternatives. Patient has consented to proceed with Esophagogastroduodenoscopy with possible intervention   08/27/2023 10:06 AM  Carlos Wise  has presented today for surgery, with the diagnosis of K22.2 (ICD-10-CM) - Esophageal stricture R13.10 (ICD-10-CM) - Dysphagia, unspecified type.  The various methods of treatment have been discussed with the patient and family. After consideration of risks, benefits and other options for treatment, the patient has consented to  Procedure(s): ESOPHAGOGASTRODUODENOSCOPY (EGD) WITH PROPOFOL (N/A) as a surgical intervention.  The patient's history has been reviewed, patient examined, no change in status, stable for surgery.  I have reviewed the patient's chart and labs.  Questions were answered to the patient's satisfaction.     [name]

## 2023-08-28 ENCOUNTER — Encounter: Payer: Self-pay | Admitting: Gastroenterology

## 2023-08-31 ENCOUNTER — Inpatient Hospital Stay (HOSPITAL_COMMUNITY)
Admission: EM | Admit: 2023-08-31 | Discharge: 2023-09-05 | DRG: 643 | Disposition: A | Discharge status: Home Health | Attending: Internal Medicine | Admitting: Internal Medicine

## 2023-08-31 ENCOUNTER — Other Ambulatory Visit: Payer: Self-pay

## 2023-08-31 ENCOUNTER — Emergency Department (HOSPITAL_COMMUNITY)

## 2023-08-31 ENCOUNTER — Encounter (HOSPITAL_COMMUNITY): Payer: Self-pay

## 2023-08-31 DIAGNOSIS — I5032 Chronic diastolic (congestive) heart failure: Secondary | ICD-10-CM | POA: Diagnosis present

## 2023-08-31 DIAGNOSIS — R339 Retention of urine, unspecified: Secondary | ICD-10-CM | POA: Diagnosis present

## 2023-08-31 DIAGNOSIS — E222 Syndrome of inappropriate secretion of antidiuretic hormone: Secondary | ICD-10-CM | POA: Diagnosis not present

## 2023-08-31 DIAGNOSIS — Z5329 Procedure and treatment not carried out because of patient's decision for other reasons: Secondary | ICD-10-CM | POA: Diagnosis present

## 2023-08-31 DIAGNOSIS — E871 Hypo-osmolality and hyponatremia: Secondary | ICD-10-CM | POA: Diagnosis present

## 2023-08-31 DIAGNOSIS — F17290 Nicotine dependence, other tobacco product, uncomplicated: Secondary | ICD-10-CM | POA: Diagnosis not present

## 2023-08-31 DIAGNOSIS — F17210 Nicotine dependence, cigarettes, uncomplicated: Secondary | ICD-10-CM | POA: Diagnosis not present

## 2023-08-31 DIAGNOSIS — Z789 Other specified health status: Secondary | ICD-10-CM | POA: Diagnosis present

## 2023-08-31 DIAGNOSIS — G9341 Metabolic encephalopathy: Secondary | ICD-10-CM | POA: Diagnosis not present

## 2023-08-31 DIAGNOSIS — Z1152 Encounter for screening for COVID-19: Secondary | ICD-10-CM | POA: Diagnosis not present

## 2023-08-31 DIAGNOSIS — Z743 Need for continuous supervision: Secondary | ICD-10-CM | POA: Diagnosis not present

## 2023-08-31 DIAGNOSIS — R4182 Altered mental status, unspecified: Secondary | ICD-10-CM | POA: Diagnosis not present

## 2023-08-31 DIAGNOSIS — F1010 Alcohol abuse, uncomplicated: Secondary | ICD-10-CM | POA: Diagnosis present

## 2023-08-31 DIAGNOSIS — Z923 Personal history of irradiation: Secondary | ICD-10-CM

## 2023-08-31 DIAGNOSIS — Z79899 Other long term (current) drug therapy: Secondary | ICD-10-CM

## 2023-08-31 DIAGNOSIS — I110 Hypertensive heart disease with heart failure: Secondary | ICD-10-CM | POA: Diagnosis present

## 2023-08-31 DIAGNOSIS — N35919 Unspecified urethral stricture, male, unspecified site: Secondary | ICD-10-CM | POA: Diagnosis not present

## 2023-08-31 DIAGNOSIS — Z8546 Personal history of malignant neoplasm of prostate: Secondary | ICD-10-CM

## 2023-08-31 DIAGNOSIS — Z8042 Family history of malignant neoplasm of prostate: Secondary | ICD-10-CM

## 2023-08-31 DIAGNOSIS — S2249XA Multiple fractures of ribs, unspecified side, initial encounter for closed fracture: Secondary | ICD-10-CM | POA: Diagnosis not present

## 2023-08-31 DIAGNOSIS — Z800 Family history of malignant neoplasm of digestive organs: Secondary | ICD-10-CM | POA: Diagnosis not present

## 2023-08-31 DIAGNOSIS — Z79890 Hormone replacement therapy (postmenopausal): Secondary | ICD-10-CM | POA: Diagnosis not present

## 2023-08-31 DIAGNOSIS — E785 Hyperlipidemia, unspecified: Secondary | ICD-10-CM | POA: Diagnosis not present

## 2023-08-31 DIAGNOSIS — Z7902 Long term (current) use of antithrombotics/antiplatelets: Secondary | ICD-10-CM | POA: Diagnosis not present

## 2023-08-31 DIAGNOSIS — Z888 Allergy status to other drugs, medicaments and biological substances status: Secondary | ICD-10-CM

## 2023-08-31 DIAGNOSIS — I671 Cerebral aneurysm, nonruptured: Secondary | ICD-10-CM | POA: Diagnosis not present

## 2023-08-31 DIAGNOSIS — I6782 Cerebral ischemia: Secondary | ICD-10-CM | POA: Diagnosis not present

## 2023-08-31 DIAGNOSIS — G40909 Epilepsy, unspecified, not intractable, without status epilepticus: Secondary | ICD-10-CM | POA: Diagnosis present

## 2023-08-31 DIAGNOSIS — Z880 Allergy status to penicillin: Secondary | ICD-10-CM | POA: Diagnosis not present

## 2023-08-31 DIAGNOSIS — R410 Disorientation, unspecified: Secondary | ICD-10-CM | POA: Diagnosis not present

## 2023-08-31 DIAGNOSIS — G9340 Encephalopathy, unspecified: Secondary | ICD-10-CM | POA: Diagnosis not present

## 2023-08-31 DIAGNOSIS — F1021 Alcohol dependence, in remission: Secondary | ICD-10-CM

## 2023-08-31 DIAGNOSIS — Z8673 Personal history of transient ischemic attack (TIA), and cerebral infarction without residual deficits: Secondary | ICD-10-CM

## 2023-08-31 DIAGNOSIS — Z8249 Family history of ischemic heart disease and other diseases of the circulatory system: Secondary | ICD-10-CM

## 2023-08-31 DIAGNOSIS — I10 Essential (primary) hypertension: Secondary | ICD-10-CM | POA: Diagnosis present

## 2023-08-31 LAB — TSH: TSH: 1.656 u[IU]/mL (ref 0.350–4.500)

## 2023-08-31 LAB — URINALYSIS, W/ REFLEX TO CULTURE (INFECTION SUSPECTED)
Bilirubin Urine: NEGATIVE
Glucose, UA: NEGATIVE mg/dL
Hgb urine dipstick: NEGATIVE
Ketones, ur: 5 mg/dL — AB
Leukocytes,Ua: NEGATIVE
Nitrite: NEGATIVE
Protein, ur: NEGATIVE mg/dL
Specific Gravity, Urine: 1.01 (ref 1.005–1.030)
pH: 8 (ref 5.0–8.0)

## 2023-08-31 LAB — CBC WITH DIFFERENTIAL/PLATELET
Abs Immature Granulocytes: 0.04 10*3/uL (ref 0.00–0.07)
Basophils Absolute: 0.1 10*3/uL (ref 0.0–0.1)
Basophils Relative: 1 %
Eosinophils Absolute: 0.1 10*3/uL (ref 0.0–0.5)
Eosinophils Relative: 2 %
HCT: 37.2 % — ABNORMAL LOW (ref 39.0–52.0)
Hemoglobin: 13.3 g/dL (ref 13.0–17.0)
Immature Granulocytes: 1 %
Lymphocytes Relative: 15 %
Lymphs Abs: 1.3 10*3/uL (ref 0.7–4.0)
MCH: 31.5 pg (ref 26.0–34.0)
MCHC: 35.8 g/dL (ref 30.0–36.0)
MCV: 88.2 fL (ref 80.0–100.0)
Monocytes Absolute: 0.7 10*3/uL (ref 0.1–1.0)
Monocytes Relative: 8 %
Neutro Abs: 6.3 10*3/uL (ref 1.7–7.7)
Neutrophils Relative %: 73 %
Platelets: 269 10*3/uL (ref 150–400)
RBC: 4.22 MIL/uL (ref 4.22–5.81)
RDW: 12.3 % (ref 11.5–15.5)
WBC: 8.6 10*3/uL (ref 4.0–10.5)
nRBC: 0 % (ref 0.0–0.2)

## 2023-08-31 LAB — COMPREHENSIVE METABOLIC PANEL
ALT: 16 U/L (ref 0–44)
AST: 24 U/L (ref 15–41)
Albumin: 4.5 g/dL (ref 3.5–5.0)
Alkaline Phosphatase: 51 U/L (ref 38–126)
Anion gap: 9 (ref 5–15)
BUN: 8 mg/dL (ref 8–23)
CO2: 22 mmol/L (ref 22–32)
Calcium: 9.4 mg/dL (ref 8.9–10.3)
Chloride: 90 mmol/L — ABNORMAL LOW (ref 98–111)
Creatinine, Ser: 0.75 mg/dL (ref 0.61–1.24)
GFR, Estimated: >60 mL/min (ref >60)
Glucose, Bld: 111 mg/dL — ABNORMAL HIGH (ref 70–99)
Potassium: 4.4 mmol/L (ref 3.5–5.1)
Sodium: 121 mmol/L — ABNORMAL LOW (ref 135–145)
Total Bilirubin: 1.3 mg/dL — ABNORMAL HIGH (ref 0.0–1.2)
Total Protein: 7.7 g/dL (ref 6.5–8.1)

## 2023-08-31 LAB — NA AND K (SODIUM & POTASSIUM), RAND UR
Potassium Urine: 46 mmol/L
Sodium, Ur: 142 mmol/L

## 2023-08-31 LAB — RESP PANEL BY RT-PCR (RSV, FLU A&B, COVID)  RVPGX2
Influenza A by PCR: NEGATIVE
Influenza B by PCR: NEGATIVE
Resp Syncytial Virus by PCR: NEGATIVE
SARS Coronavirus 2 by RT PCR: NEGATIVE

## 2023-08-31 LAB — AMMONIA: Ammonia: 18 umol/L (ref 9–35)

## 2023-08-31 LAB — ETHANOL: Alcohol, Ethyl (B): <10 mg/dL (ref <10)

## 2023-08-31 LAB — OSMOLALITY, URINE: Osmolality, Ur: 450 mosm/kg (ref 300–900)

## 2023-08-31 MED ORDER — SODIUM CHLORIDE 0.9% FLUSH
3.0000 mL | Freq: Two times a day (BID) | INTRAVENOUS | Status: DC
  Administered 2023-08-31 – 2023-09-04 (×9): 3 mL via INTRAVENOUS

## 2023-08-31 MED ORDER — ATORVASTATIN CALCIUM 10 MG PO TABS
5.0000 mg | ORAL_TABLET | Freq: Every day | ORAL | Status: DC
Start: 2023-08-31 — End: 2023-09-05
  Administered 2023-08-31 – 2023-09-04 (×5): 5 mg via ORAL
  Filled 2023-08-31 (×5): qty 1

## 2023-08-31 MED ORDER — CLOPIDOGREL BISULFATE 75 MG PO TABS
75.0000 mg | ORAL_TABLET | Freq: Every day | ORAL | Status: DC
  Administered 2023-09-01 – 2023-09-05 (×5): 75 mg via ORAL
  Filled 2023-08-31 (×5): qty 1

## 2023-08-31 MED ORDER — TAMSULOSIN HCL 0.4 MG PO CAPS
0.4000 mg | ORAL_CAPSULE | Freq: Every day | ORAL | Status: DC
  Administered 2023-09-01 – 2023-09-05 (×5): 0.4 mg via ORAL
  Filled 2023-08-31 (×4): qty 1

## 2023-08-31 MED ORDER — SODIUM CHLORIDE 0.9 % IV SOLN: INTRAVENOUS | Status: DC

## 2023-08-31 MED ORDER — NICOTINE 14 MG/24HR TD PT24
14.0000 mg | MEDICATED_PATCH | Freq: Once | TRANSDERMAL | Status: AC
  Administered 2023-08-31: 14 mg via TRANSDERMAL
  Filled 2023-08-31: qty 1

## 2023-08-31 MED ORDER — ACETAMINOPHEN 325 MG PO TABS: 650.0000 mg | ORAL_TABLET | Freq: Four times a day (QID) | ORAL | Status: DC | PRN

## 2023-08-31 MED ORDER — ADULT MULTIVITAMIN W/MINERALS CH
1.0000 | ORAL_TABLET | Freq: Every day | ORAL | Status: DC
  Administered 2023-08-31 – 2023-09-05 (×6): 1 via ORAL
  Filled 2023-08-31 (×6): qty 1

## 2023-08-31 MED ORDER — LORAZEPAM 2 MG/ML IJ SOLN: 0.0000 mg | Freq: Three times a day (TID) | INTRAMUSCULAR | Status: AC

## 2023-08-31 MED ORDER — SODIUM CHLORIDE 0.9 % IV BOLUS
1000.0000 mL | Freq: Once | INTRAVENOUS | Status: AC
  Administered 2023-08-31: 1000 mL via INTRAVENOUS

## 2023-08-31 MED ORDER — THIAMINE HCL 100 MG/ML IJ SOLN
100.0000 mg | Freq: Every day | INTRAMUSCULAR | Status: DC
  Administered 2023-09-01: 100 mg via INTRAVENOUS
  Filled 2023-08-31: qty 2

## 2023-08-31 MED ORDER — LORAZEPAM 2 MG/ML IJ SOLN
0.0000 mg | Freq: Four times a day (QID) | INTRAMUSCULAR | Status: DC
  Administered 2023-08-31: 2 mg via INTRAVENOUS
  Filled 2023-08-31: qty 1

## 2023-08-31 MED ORDER — GABAPENTIN 300 MG PO CAPS
600.0000 mg | ORAL_CAPSULE | Freq: Every day | ORAL | Status: DC
  Administered 2023-08-31 – 2023-09-04 (×5): 600 mg via ORAL
  Filled 2023-08-31 (×5): qty 2

## 2023-08-31 MED ORDER — THIAMINE MONONITRATE 100 MG PO TABS
100.0000 mg | ORAL_TABLET | Freq: Every day | ORAL | Status: DC
  Administered 2023-08-31: 100 mg via ORAL
  Filled 2023-08-31 (×2): qty 1

## 2023-08-31 MED ORDER — ONDANSETRON HCL 4 MG PO TABS: 4.0000 mg | ORAL_TABLET | Freq: Four times a day (QID) | ORAL | Status: DC | PRN

## 2023-08-31 MED ORDER — SODIUM CHLORIDE 1 G PO TABS
1.0000 g | ORAL_TABLET | Freq: Three times a day (TID) | ORAL | Status: DC
  Administered 2023-09-01 – 2023-09-05 (×13): 1 g via ORAL
  Filled 2023-08-31 (×15): qty 1

## 2023-08-31 MED ORDER — ACETAMINOPHEN 650 MG RE SUPP: 650.0000 mg | Freq: Four times a day (QID) | RECTAL | Status: DC | PRN

## 2023-08-31 MED ORDER — ENOXAPARIN SODIUM 40 MG/0.4ML IJ SOSY
40.0000 mg | PREFILLED_SYRINGE | INTRAMUSCULAR | Status: DC
  Administered 2023-08-31 – 2023-09-03 (×3): 40 mg via SUBCUTANEOUS
  Filled 2023-08-31 (×5): qty 0.4

## 2023-08-31 MED ORDER — BUSPIRONE HCL 10 MG PO TABS: 15.0000 mg | ORAL_TABLET | ORAL | Status: DC

## 2023-08-31 MED ORDER — LORAZEPAM 2 MG/ML IJ SOLN: 1.0000 mg | INTRAMUSCULAR | Status: AC | PRN

## 2023-08-31 MED ORDER — SENNOSIDES-DOCUSATE SODIUM 8.6-50 MG PO TABS: 1.0000 | ORAL_TABLET | Freq: Every evening | ORAL | Status: DC | PRN

## 2023-08-31 MED ORDER — ONDANSETRON HCL 4 MG/2ML IJ SOLN: 4.0000 mg | Freq: Four times a day (QID) | INTRAMUSCULAR | Status: DC | PRN

## 2023-08-31 MED ORDER — LORAZEPAM 2 MG/ML IJ SOLN
0.0000 mg | INTRAMUSCULAR | Status: AC
Start: 2023-08-31 — End: 2023-09-02

## 2023-08-31 MED ORDER — BUSPIRONE HCL 5 MG PO TABS
15.0000 mg | ORAL_TABLET | Freq: Every day | ORAL | Status: DC
  Administered 2023-08-31 – 2023-09-04 (×5): 15 mg via ORAL
  Filled 2023-08-31: qty 2
  Filled 2023-08-31 (×4): qty 3

## 2023-08-31 MED ORDER — SALINE SPRAY 0.65 % NA SOLN: 1.0000 | Freq: Every day | NASAL | Status: DC | PRN

## 2023-08-31 MED ORDER — LAMOTRIGINE 100 MG PO TABS
150.0000 mg | ORAL_TABLET | Freq: Two times a day (BID) | ORAL | Status: DC
  Administered 2023-08-31 – 2023-09-05 (×10): 150 mg via ORAL
  Filled 2023-08-31: qty 2
  Filled 2023-08-31: qty 6
  Filled 2023-08-31 (×5): qty 2
  Filled 2023-08-31: qty 4
  Filled 2023-08-31 (×3): qty 2

## 2023-08-31 MED ORDER — FOLIC ACID 1 MG PO TABS
1.0000 mg | ORAL_TABLET | Freq: Every day | ORAL | Status: DC
  Administered 2023-08-31 – 2023-09-05 (×6): 1 mg via ORAL
  Filled 2023-08-31 (×6): qty 1

## 2023-08-31 MED ORDER — THIAMINE MONONITRATE 100 MG PO TABS
100.0000 mg | ORAL_TABLET | Freq: Every day | ORAL | Status: DC
  Administered 2023-09-02 – 2023-09-05 (×4): 100 mg via ORAL
  Filled 2023-08-31 (×6): qty 1

## 2023-08-31 MED ORDER — MELATONIN 3 MG PO TABS
3.0000 mg | ORAL_TABLET | Freq: Every day | ORAL | Status: DC
  Administered 2023-08-31 – 2023-09-04 (×5): 3 mg via ORAL
  Filled 2023-08-31 (×5): qty 1

## 2023-08-31 MED ORDER — PANTOPRAZOLE SODIUM 40 MG PO TBEC
40.0000 mg | DELAYED_RELEASE_TABLET | Freq: Every day | ORAL | Status: DC
  Administered 2023-09-01 – 2023-09-05 (×5): 40 mg via ORAL
  Filled 2023-08-31 (×5): qty 1

## 2023-08-31 MED ORDER — LORAZEPAM 1 MG PO TABS: 1.0000 mg | ORAL_TABLET | ORAL | Status: AC | PRN

## 2023-08-31 MED ORDER — BUSPIRONE HCL 5 MG PO TABS
30.0000 mg | ORAL_TABLET | Freq: Every day | ORAL | Status: DC
  Administered 2023-09-01 – 2023-09-05 (×5): 30 mg via ORAL
  Filled 2023-08-31 (×3): qty 6
  Filled 2023-08-31: qty 3
  Filled 2023-08-31: qty 6

## 2023-08-31 NOTE — ED Triage Notes (Signed)
 Per [facility], Pt, from home, c/o nasal congestion x several days and "brain fog" starting this morning.  Denies pain.  Pt is A&Ox4.    LKW 1500 yesterday.

## 2023-08-31 NOTE — ED Provider Triage Note (Signed)
 Emergency Medicine Provider Triage Evaluation Note  Carlos Wise , a 83 y.o. male  was evaluated in triage.  Pt complains of transient altered mental status and confusion.  Review of Systems  Positive: Disorientation, confusion, not thinking clearly for several hours this morning Negative: Falls, trauma, headache, neck pain, chest pain, abdominal pain, nausea, vomiting, constipation, diarrhea, urinary changes.  Reports some chronic mild congestion but no productive cough, chest pain, shortness of breath, palpitations, numbness, tingling, weakness of extremities.  Denies speech difficulties vision changes or dizziness.  Physical Exam  BP (!) 153/88 (BP Location: Right Arm)   Pulse 80   Temp 98.2 F (36.8 C) (Oral)   Resp 16   Ht 5\' 9"  (1.753 m)   Wt 72.1 kg   SpO2 97%   BMI 23.48 kg/m  Gen:   Awake, no distress   Resp:  Normal effort  MSK:   Moves extremities without difficulty  Other:  Intact finger-nose-finger testing, symmetric smile, sensation, strength.  Pupils symmetric and reactive.  No evidence of acute trauma  Medical Decision Making  Medically screening exam initiated at 11:44 AM.  Appropriate orders placed.  KALIL WOESSNER was informed that the remainder of the evaluation will be completed by another provider, this initial triage assessment does not replace that evaluation, and the importance of remaining in the ED until their evaluation is complete.  Carlos Wise is a 83 y.o. male with a past medical history significant for previous prostate cancer, seizure disorder, previous subdural hematoma, heart failure, hypertension, previous metabolic encephalopathy, previous hyponatremia, and prior stroke who presents with transient altered mental status this morning.  Patient reportedly was disoriented and confused lasting several hours but is now feeling better.  He denies any headache, neck pain he denies any trauma.  He denies any chest pain or shortness of breath.  Denies  palpitations.  Reports some mild congestion and cough that seems more chronic.  Denies any nausea, vomiting, constipation, diarrhea, or urinary changes.  He denies any dizziness, speech troubles or vision changes.  Denies any arm or leg numbness or weakness.  Is unsure if this feels like when he is at strokes in the past.  Of note, patient recently had to stop and hold his Plavix for some esophageal dilation procedures over the last 2 months.  On exam, lungs clear.  Chest nontender.  Abdomen nontender.  Patient moving all extremities with intact sensation and strength.  Good finger-nose-finger testing bilaterally.  Symmetric smile.  Speech is clear.  Pupil symmetric and reactive with normal extraocular movements.  No evidence of head trauma.  Patient resting comfortably without distress now.  Given the patient's transient confusion and altered mental status and what appears to be a history of hyponatremia, alcohol troubles, and metabolic encephalopathies, I do feel need to get some screening labs.  Will get workup to look for occult infection in the urine or chest given his little cough.  Will check for COVID and flu and RSV.  Will defer to evaluating team to decide if he needs neurology consultation to discuss EEG given his history of seizures or even imaging such as CT or MRI given his transient confusion and altered status and disorientation in the setting of recent Plavix holiday.  Anticipate disposition after evaluation and workup.      Carlos Wise, Canary Brim, MD 08/31/23 1200

## 2023-08-31 NOTE — ED Provider Notes (Signed)
 Accepted handoff at shift change from [facility]. Please see prior provider note for full HPI.  Briefly: Patient is a 83 y.o. male who presents to the ER for altered mental status. Hx hyponatremia, usually on salt tablets at home. Called his friend crying complaining of confusion.  DDX/Plan: Na 121, given IV fluids. Plan for admission for encephalopathy, likely related to hyponatremia. Plan to obtain CT head to rule out other intracranial pathology. Neurologic exam normal. Still complaining of brain fog/general confusion but is alert and oriented now.   Physical Exam  BP (!) 153/88 (BP Location: Right Arm)   Pulse 80   Temp 98.2 F (36.8 C) (Oral)   Resp 16   Ht 5\' 9"  (1.753 m)   Wt 72.1 kg   SpO2 97%   BMI 23.48 kg/m   Physical Exam Vitals and nursing note reviewed.  Constitutional:      Appearance: Normal appearance.  HENT:     Head: Normocephalic and atraumatic.  Eyes:     Conjunctiva/sclera: Conjunctivae normal.  Pulmonary:     Effort: Pulmonary effort is normal. No respiratory distress.  Skin:    General: Skin is warm and dry.  Neurological:     Mental Status: He is alert and oriented to person, place, and time.  Psychiatric:        Mood and Affect: Mood normal.        Behavior: Behavior normal.    Results  CT Head Wo Contrast Result Date: 08/31/2023 CLINICAL DATA:  Initial evaluation for acute mental status change, unknown cause. EXAM: CT HEAD WITHOUT CONTRAST TECHNIQUE: Contiguous axial images were obtained from the base of the skull through the vertex without intravenous contrast. RADIATION DOSE REDUCTION: This exam was performed according to the departmental dose-optimization program which includes automated exposure control, adjustment of the mA and/or kV according to patient size and/or use of iterative reconstruction technique. COMPARISON:  Prior study from 01/03/2023 FINDINGS: Brain: Age-related cerebral atrophy. Patchy and confluent hypodensity involving the  supratentorial cerebral white matter, consistent with chronic small vessel ischemic disease, moderate to advanced in nature. No acute intracranial hemorrhage. No acute large vessel territory infarct. No mass lesion or midline shift. Ventricular prominence related global parenchymal volume loss without hydrocephalus. No extra-axial fluid collection. Vascular: No abnormal hyperdense vessel. Calcified atherosclerosis present at skull base. Post treatment changes related to prior cavernous right ICA aneurysm treatment noted. Skull: Scalp soft tissues within normal limits.  Calvarium intact. Sinuses/Orbits: Globes orbital soft tissues within normal limits. Paranasal sinuses are largely clear. No significant mastoid effusion. Other: None. IMPRESSION: 1. No acute intracranial abnormality. 2. 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 3. 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 Electronically Signed   By: Rise Mu M.D.   On: 08/31/2023 19:37   ED Course / MDM    Medical Decision Making Amount and/or Complexity of Data Reviewed Labs: ordered. Radiology: ordered.  Risk OTC drugs. Prescription drug management. Decision regarding hospitalization.  1830 -- Reviewed plan with patient and friend (HCPOA) at bedside. Reports patient is usually fairly independent. Lives alone, but frequently has nurses who check on him as well as cameras in his home. Patient still complaining of generalized weakness and brain fog. Friend expressed that patient is a daily drinker and smoker and will require medication to prevent withdrawals while admitted. I have ordered nicotine patch, thiamine tablet, and ativan per CIWA protocol as needed.   1900 -- Friend at  bedside expressed patient self catheterizes at home for urine and is feeling uncomfortable like he needs to be cathed. Nursing placed in and out cath and got 300 cc of urinary  output. Patient continuing to complain he needs to urinate. Requests placing foley catheter.  2020 -- Consulted with [facility] Dr Antionette Char who will admit. The patient appears reasonably stabilized for admission considering the current resources, flow, and capabilities available in the ED at this time, and I doubt any other [facility] requiring further screening and/or treatment in the ED prior to admission.   Jeanella Flattery 08/31/23 2028    Alvira Monday, MD 09/01/23 1214

## 2023-08-31 NOTE — TOC CM/SW Note (Addendum)
 SW provided resources for Substance Abuse and added to AVS. Hx of alcohol abuse, seizure disorder, history of CVA, history of SDH, anxiety.   Lily Peer, MSW, LCSWA Transition of Care  Clinical Social Worker (ED 3-11 Mon-Fri)  [phone]

## 2023-08-31 NOTE — ED Notes (Addendum)
 Pt cleaned and placed in gown.   Belongings in bag and on his bed. Falls band and grip hospital socks placed on patient.

## 2023-08-31 NOTE — Progress Notes (Signed)
 Please call [facility]) when patient gets a room assignment [phone].

## 2023-08-31 NOTE — ED Notes (Signed)
 3 failed catheter attempt by this RN - 52fr, 21fr coude, and 61fr unsuccessful

## 2023-08-31 NOTE — ED Notes (Signed)
 Assumed pt care.

## 2023-08-31 NOTE — ED Provider Notes (Addendum)
 [facility] AT [facility] Provider Note   CSN: [id number] Arrival date & time: 08/31/23  1120     History  Chief Complaint  Patient presents with   Nasal Congestion   Altered Mental Status    Carlos Wise is a 83 y.o. male.  With a history of hypertension, seizure disorder, hyperlipidemia, prostate cancer, previous CVA, previous SDH presenting to the ED for evaluation of altered mental status.  He states he felt disoriented, confused and had some brain fog for a few hours this morning.  He denies any recent falls.  No pain.  Specifically no headache or neck pain.  No chest pain or shortness of breath.  No nausea or vomiting.  No urinary complaints.  He denies dizziness, slurred speech, vision changes.  No numbness, weakness or tingling.  He had esophageal dilation last Friday and has been off his Plavix since that time.  Symptoms began shortly after waking up this morning.  He called his friend who is his healthcare power of attorney.  She is at bedside.  She reports that he was confused and crying at that time.  His symptoms have improved but he still feels mildly confused.   Altered Mental Status Presenting symptoms: confusion        Home Medications Prior to Admission medications   Medication Sig Start Date End Date Taking? Authorizing Provider  aspirin EC 81 MG tablet Take 81 mg by mouth daily. Swallow whole. Patient not taking: Reported on 10/15/2022    [provider]  atorvastatin (LIPITOR) 20 MG tablet Take 10 mg by mouth daily.    [provider]  busPIRone (BUSPAR) 15 MG tablet Take 15 mg by mouth 2 (two) times daily. 06/20/22   [provider]  Cholecalciferol (VITAMIN D3 PO) Take 1 capsule by mouth daily.    [provider]  clobetasol cream (TEMOVATE) 0.05 % Apply 1 Application topically daily as needed (itching scalp).    [provider]  clopidogrel (PLAVIX) 75 MG tablet TAKE 1 TABLET BY MOUTH  EVERY DAY 07/12/23   Windell Norfolk, MD  Cyanocobalamin (VITAMIN B-12 PO) Take 1 tablet by mouth daily.    [provider]  fluticasone (FLONASE) 50 MCG/ACT nasal spray Place 1 spray into both nostrils daily as needed for allergies. 12/10/19   [provider]  folic acid (FOLVITE) 1 MG tablet Take 1 mg by mouth daily.    [provider]  gabapentin (NEURONTIN) 300 MG capsule Take 300 mg by mouth at bedtime.    [provider]  ketotifen (ALLERGY EYE DROPS) 0.025 % ophthalmic solution Place 1 drop into both eyes daily as needed (red eyes).    [provider]  lamoTRIgine (LAMICTAL) 150 MG tablet Take 1 tablet (150 mg total) by mouth 2 (two) times daily. 10/15/22 10/10/23  Windell Norfolk, MD  pantoprazole (PROTONIX) 40 MG tablet Take 40 mg by mouth daily.    [provider]  Pseudoeph-Doxylamine-DM-APAP (NYQUIL PO) Take 2 capsules by mouth at bedtime as needed (sleep when aleve pm is no working). Patient not taking: Reported on 10/15/2022    [provider]  sodium chloride (OCEAN) 0.65 % SOLN nasal spray Place 1 spray into both nostrils daily as needed for congestion.    [provider]  sodium chloride 1 g tablet Take 1 g by mouth 3 (three) times daily.    [provider]  tamsulosin (FLOMAX) 0.4 MG CAPS capsule Take 0.4 mg by  mouth.    [provider]  thiamine (VITAMIN B1) 100 MG tablet Take 250 mg by mouth daily. 02/16/22   [provider]  triamcinolone cream (KENALOG) 0.1 % Apply 1 Application topically daily as needed (itching skin).    [provider]      Allergies    Oxcarbazepine and Penicillins    Review of Systems   Review of Systems  Reason unable to perform ROS: Confusion.  Psychiatric/Behavioral:  Positive for confusion.     Physical Exam Updated Vital Signs BP (!) 153/88 (BP Location: Right Arm)   Pulse 80   Temp 98.2 F (36.8 C) (Oral)   Resp 16   Ht 5\' 9"  (1.753 m)    Wt 72.1 kg   SpO2 97%   BMI 23.48 kg/m  Physical Exam Vitals and nursing note reviewed.  Constitutional:      General: He is not in acute distress.    Appearance: Normal appearance. He is normal weight. He is not ill-appearing.  HENT:     Head: Normocephalic and atraumatic.  Cardiovascular:     Rate and Rhythm: Normal rate and regular rhythm.  Pulmonary:     Effort: Pulmonary effort is normal. No respiratory distress.  Abdominal:     General: Abdomen is flat.  Musculoskeletal:        General: Normal range of motion.     Cervical back: Neck supple.  Skin:    General: Skin is warm and dry.  Neurological:     Mental Status: He is alert and oriented to person, place, and time.     Comments:   MENTAL STATUS: AAOx3   LANG/SPEECH: Fluent, intact naming, repetition & comprehension   CRANIAL NERVES:   II: Pupils equal and reactive   III, IV, VI: EOM intact, no gaze preference or deviation, no nystagmus   V: normal sensation of the face   VII: no facial asymmetry   VIII: Very hard of hearing   MOTOR: 5/5 in both upper and lower extremities   SENSORY: Normal to touch in all extremiteis   COORD: Normal finger to nose, heel to shin and shoulder shrug, no tremor, no dysmetria. No pronator drift   Psychiatric:        Mood and Affect: Mood normal.        Behavior: Behavior normal.     ED Results / Procedures / Treatments   Labs (all labs ordered are listed, but only abnormal results are displayed) Labs Reviewed  CBC WITH DIFFERENTIAL/PLATELET - Abnormal; Notable for the following components:      Result Value   HCT 37.2 (*)    All other components within normal limits  COMPREHENSIVE METABOLIC PANEL - Abnormal; Notable for the following components:   Sodium 121 (*)    Chloride 90 (*)    Glucose, Bld 111 (*)    Total Bilirubin 1.3 (*)    All other components within normal limits  RESP PANEL BY RT-PCR (RSV, FLU A&B, COVID)  RVPGX2  TSH  AMMONIA  ETHANOL  URINALYSIS, W/ REFLEX  TO CULTURE (INFECTION SUSPECTED)  NA AND K (SODIUM & POTASSIUM), RAND UR  OSMOLALITY, URINE    EKG EKG Interpretation Date/Time:  Tuesday August 31 2023 12:05:22 EDT Ventricular Rate:  75 PR Interval:  176 QRS Duration:  82 QT Interval:  382 QTC Calculation: 426 R Axis:   20  Text Interpretation: Normal sinus rhythm Normal ECG No significant change since last tracing Confirmed by Elayne Snare ([id number]) on  08/31/2023 2:26:08 PM  Radiology DG Chest 2 View Result Date: 08/31/2023 CLINICAL DATA:  Altered mental status EXAM: CHEST - 2 VIEW COMPARISON:  None Available. FINDINGS: Normal cardiac silhouette. No effusion, infiltrate or pneumothorax. Multiple posterior LEFT healed rib fractures. IMPRESSION: No acute cardiopulmonary process. Electronically Signed   By: Genevive Bi M.D.   On: 08/31/2023 14:32    Procedures Procedures    Medications Ordered in ED Medications  sodium chloride 0.9 % bolus 1,000 mL (has no administration in time range)    ED Course/ Medical Decision Making/ A&P                                 Medical Decision Making Amount and/or Complexity of Data Reviewed Labs: ordered. Radiology: ordered.  This patient presents to the ED for concern of altered mental status, this involves an extensive number of treatment options, and is a complaint that carries with it a high risk of complications and morbidity. The differential diagnosis for AMS is extensive and includes, but is not limited to: drug overdose - opioids, alcohol, sedatives, antipsychotics, drug withdrawal, others; Metabolic: hypoxia, hypoglycemia, hyperglycemia, hypercalcemia, hypernatremia, hyponatremia, uremia, hepatic encephalopathy, hypothyroidism, hyperthyroidism, vitamin B12 or thiamine deficiency, carbon monoxide poisoning, Wilson's disease, Lactic acidosis, DKA/HHOS; Infectious: meningitis, encephalitis, bacteremia/sepsis, urinary tract infection, pneumonia, neurosyphilis; Structural:  Space-occupying lesion, (brain tumor, subdural hematoma, hydrocephalus,); Vascular: stroke, subarachnoid hemorrhage, coronary ischemia, hypertensive encephalopathy, CNS vasculitis, thrombotic thrombocytopenic purpura, disseminated intravascular coagulation, hyperviscosity; Psychiatric: Schizophrenia, depression; Other: Seizure, hypothermia, heat stroke, ICU psychosis, dementia -"sundowning."   My initial workup includes labs, imaging, EKG  Additional history obtained from: Nursing notes from this visit. Previous records within EMR system ED visit on 01/03/2023 for similar EMS provides portion of the history Health care power of attorney at bedside  I ordered, reviewed and interpreted labs which include: CBC, CMP, ethanol, ammonia, TSH, urinalysis, respiratory panel.  No leukocytosis or anemia.  Respiratory panel negative.  Ethanol, ammonia and TSH within normal limits.  CMP significant for hyponatremia of 121 with a baseline of approximately 128.  Hypochloremia of 90.  Hyperglycemia of 111.  No kidney dysfunction.  Total bilirubin minimally elevated to 1.3.  Anion gap normal.  I ordered imaging studies including chest x-ray, CT head I independently visualized and interpreted imaging which showed negative chest x-ray I agree with the radiologist interpretation  Afebrile, hypertensive but otherwise hemodynamically stable.  83 year old male presenting to the ED for evaluation of confusion.  This occurred after he woke up this morning.  Last known well yesterday evening.  He called his healthcare power of attorney stating he was confused and was crying.  No trauma.  He has a nonfocal neurologic exam.  Laboratory workup concerning for hyponatremia of 121.  This is below his baseline.  He does take sodium tablets and has been taking this as prescribed.  Suspect metabolic encephalopathy due to hyponatremia.  Treatment was initiated with a bolus of normal saline.  I discussed treatment options with the  patient and caretaker at bedside.  They are agreeable to admission for continued management.  Plan at time to change is pending CT head.  Care handed off to oncoming provider pending urinalysis and CT head.  Plan may change at the discretion of the oncoming provider.  Please see their note for final disposition and decision making.  Note: Portions of this report may have been transcribed using voice recognition software. Every effort was made  to ensure accuracy; however, inadvertent computerized transcription errors may still be present.        Final Clinical Impression(s) / ED Diagnoses Final diagnoses:  Metabolic encephalopathy  Hyponatremia  Disorientation    Rx / DC Orders ED Discharge Orders     None         Michelle Piper, PA-C 08/31/23 1534    Eason Housman, Edsel Petrin, PA-C 08/31/23 1536    Theresia Lo, [place] K, DO 08/31/23 [phone]

## 2023-08-31 NOTE — ED Notes (Signed)
 Attempted catheter placement with 34f, unsuccessful. 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 Will ask provider

## 2023-08-31 NOTE — H&P (Addendum)
 History and Physical    Carlos Wise ZDG:[id number] DOB: 1940/12/15 DOA: 08/31/2023  PCP: Ailene Ravel, MD   Patient coming from: Home   Chief Complaint: Confusion, general weakness.  HPI: Carlos Wise is a 83 y.o. male with medical history significant for alcohol abuse, seizure disorder, history of CVA, history of SDH, anxiety, and chronic hyponatremia who presents with confusion and generalized weakness.  Patient reports developing generalized weakness and "brain fog" this morning.  He called his friend, Carlos Wise, who is his HCPOA.  She reports that he was crying and seemed to be confused on the phone.  He denied headache, change in vision, focal numbness or weakness, SOB, or chest pain.  ED Course: Upon arrival to the ED, patient is found to be afebrile and saturating well on room air with normal heart rate and elevated blood pressure.  EKG demonstrates sinus rhythm and chest x-ray is negative for acute cardiopulmonary disease.  Head CT is negative for acute intracranial abnormality.  Labs are most notable for sodium 121, normal creatinine, normal WBC, normal ammonia, normal TSH, negative respiratory virus panel, and undetectable ethanol.  Patient was given a liter of NS, Ativan, and thiamine in the ED.  Review of Systems:  ROS limited by patient's clinical condition.  Past Medical History:  Diagnosis Date   Herpes zoster    Hyperlipemia    Hypertension    Intestinal polyp    hyperplastic   Prostate cancer (HCC)    S/P radiation therapy 12/12/2014 through 02/06/2015                                                      Prostate 7800 cGy in 40 sessions, seminal vesicles 5600 cGy in 40 sessions                           Seizure disorder [facility])     Past Surgical History:  Procedure Laterality Date   Carotid Artery Catherization     COLONOSCOPY     ESOPHAGEAL DILATION  07/30/2023   Procedure: ESOPHAGEAL DILATION;  Surgeon: [name], DO;  Location: [facility] ENDOSCOPY;   Service: Gastroenterology;;   ESOPHAGEAL DILATION  08/27/2023   Procedure: DILATION, ESOPHAGUS;  Surgeon: [name], DO;  Location: [facility] ENDOSCOPY;  Service: Gastroenterology;;   ESOPHAGOGASTRODUODENOSCOPY (EGD) WITH PROPOFOL N/A 07/30/2023   Procedure: ESOPHAGOGASTRODUODENOSCOPY (EGD) WITH PROPOFOL;  Surgeon: [name], DO;  Location: [facility] ENDOSCOPY;  Service: Gastroenterology;  Laterality: N/A;   ESOPHAGOGASTRODUODENOSCOPY (EGD) WITH PROPOFOL N/A 08/27/2023   Procedure: ESOPHAGOGASTRODUODENOSCOPY (EGD) WITH PROPOFOL;  Surgeon: [name], DO;  Location: [facility] ENDOSCOPY;  Service: Gastroenterology;  Laterality: N/A;   PROSTATE BIOPSY  06/27/2014    Social History:   reports that he has been smoking cigarettes. He has never used smokeless tobacco. He reports current alcohol use. He reports that he does not use drugs.  Allergies  Allergen Reactions   Oxcarbazepine     Significant and severe hyponatremia noted within a few days of starting Oxcarbazepine.   Penicillins Other (See Comments)    Patient states no allergy, but has preference that he does not want this medication    Family History  Problem Relation Age of Onset   Colon cancer Mother    Prostate cancer Father  Heart attack Father      Prior to Admission medications   Medication Sig Start Date End Date Taking? Authorizing Provider  atorvastatin (LIPITOR) 10 MG tablet Take 5 mg by mouth at bedtime.   Yes [provider]  busPIRone (BUSPAR) 15 MG tablet Take 15 mg by mouth See admin instructions. 2 tab in am , 1 tab at pm 06/20/22  Yes [provider]  Cholecalciferol (VITAMIN D3 PO) Take 1 capsule by mouth daily.   Yes [provider]  clobetasol cream (TEMOVATE) 0.05 % Apply 1 Application topically daily as needed (itching scalp).   Yes [provider]  clopidogrel (PLAVIX) 75 MG tablet TAKE 1 TABLET BY MOUTH EVERY DAY 07/12/23  Yes Camara, Amadou, MD   fluticasone (FLONASE) 50 MCG/ACT nasal spray Place 1 spray into both nostrils daily as needed for allergies. 12/10/19  Yes [provider]  folic acid (FOLVITE) 1 MG tablet Take 1 mg by mouth daily.   Yes [provider]  gabapentin (NEURONTIN) 300 MG capsule Take 600 mg by mouth at bedtime.   Yes [provider]  ketotifen (ALLERGY EYE DROPS) 0.025 % ophthalmic solution Place 1 drop into both eyes daily as needed (red eyes).   Yes [provider]  lamoTRIgine (LAMICTAL) 150 MG tablet Take 1 tablet (150 mg total) by mouth 2 (two) times daily. 10/15/22 10/10/23 Yes Camara, Amadou, MD  melatonin 1 MG TABS tablet Take 1 mg by mouth at bedtime.   Yes [provider]  pantoprazole (PROTONIX) 40 MG tablet Take 40 mg by mouth daily.   Yes [provider]  sodium chloride (OCEAN) 0.65 % SOLN nasal spray Place 1 spray into both nostrils daily as needed for congestion.   Yes [provider]  sodium chloride 1 g tablet Take 1 g by mouth 3 (three) times daily.   Yes [provider]  tamsulosin (FLOMAX) 0.4 MG CAPS capsule Take 0.4 mg by mouth.   Yes [provider]  thiamine (VITAMIN B1) 100 MG tablet Take 250 mg by mouth daily. 02/16/22  Yes [provider]  triamcinolone cream (KENALOG) 0.1 % Apply 1 Application topically daily as needed (itching skin).   Yes [provider]  Cyanocobalamin (VITAMIN B-12 PO) Take 1 tablet by mouth daily. Patient not taking: Reported on 08/31/2023    [provider]    Physical Exam: Vitals:   08/31/23 1134 08/31/23 1916 08/31/23 1917 08/31/23 1952  BP:  (!) 143/93 (!) 143/93 (!) 162/100  Pulse:  88 88 81  Resp:  18  20  Temp:  98.5 F (36.9 C)    TempSrc:  Oral    SpO2:  98%  100%  Weight: 72.1 kg     Height: 5\' 9"  (1.753 m)       Constitutional: NAD, no pallor or diaphoresis Eyes: PERTLA, lids and conjunctivae normal ENMT: Mucous membranes are moist.  Posterior pharynx clear of any exudate or lesions.   Neck: supple, no masses  Respiratory: No wheezing, no crackles. No accessory muscle use.  Cardiovascular: S1 & S2 heard, regular rate and rhythm. No extremity edema.  Abdomen: No distension, no tenderness, soft. Bowel sounds active.  Musculoskeletal: no clubbing / cyanosis. No joint deformity upper and lower extremities.   Skin: no significant rashes, lesions, ulcers. Warm, dry, well-perfused. Neurologic: CN 2-12 grossly intact aside from gross hearing deficit. Moving all extremities. Alert and oriented to person, place, and situation.  Psychiatric: Calm. Cooperative.  Labs and Imaging on Admission: I have personally reviewed following labs and imaging studies  CBC: Recent Labs  Lab 08/31/23 1233  WBC 8.6  NEUTROABS 6.3  HGB 13.3  HCT 37.2*  MCV 88.2  PLT 269   Basic Metabolic Panel: Recent Labs  Lab 08/31/23 1233  NA 121*  K 4.4  CL 90*  CO2 22  GLUCOSE 111*  BUN 8  CREATININE 0.75  CALCIUM 9.4   GFR: Estimated Creatinine Clearance: 72.4 mL/min (by C-G formula based on SCr of 0.75 mg/dL). Liver Function Tests: Recent Labs  Lab 08/31/23 1233  AST 24  ALT 16  ALKPHOS 51  BILITOT 1.3*  PROT 7.7  ALBUMIN 4.5   No results for input(s): "LIPASE", "AMYLASE" in the last 168 hours. Recent Labs  Lab 08/31/23 1233  AMMONIA 18   Coagulation Profile: No results for input(s): "INR", "PROTIME" in the last 168 hours. Cardiac Enzymes: No results for input(s): "CKTOTAL", "CKMB", "CKMBINDEX", "TROPONINI" in the last 168 hours. BNP (last 3 results) No results for input(s): "PROBNP" in the last 8760 hours. HbA1C: No results for input(s): "HGBA1C" in the last 72 hours. CBG: No results for input(s): "GLUCAP" in the last 168 hours. Lipid Profile: No results for input(s): "CHOL", "HDL", "LDLCALC", "TRIG", "CHOLHDL", "LDLDIRECT" in the last 72 hours. Thyroid Function Tests: Recent Labs    08/31/23 1233  TSH 1.656    Anemia Panel: No results for input(s): "VITAMINB12", "FOLATE", "FERRITIN", "TIBC", "IRON", "RETICCTPCT" in the last 72 hours. Urine analysis:    Component Value Date/Time   COLORURINE YELLOW 08/31/2023 1612   APPEARANCEUR CLEAR 08/31/2023 1612   LABSPEC 1.010 08/31/2023 1612   PHURINE 8.0 08/31/2023 1612   GLUCOSEU NEGATIVE 08/31/2023 1612   HGBUR NEGATIVE 08/31/2023 1612   BILIRUBINUR NEGATIVE 08/31/2023 1612   KETONESUR 5 (A) 08/31/2023 1612   PROTEINUR NEGATIVE 08/31/2023 1612   NITRITE NEGATIVE 08/31/2023 1612   LEUKOCYTESUR NEGATIVE 08/31/2023 1612   Sepsis Labs: @LABRCNTIP (procalcitonin:4,lacticidven:4) ) Recent Results (from the past 240 hours)  Resp panel by RT-PCR (RSV, Flu A&B, Covid) Anterior Nasal Swab     Status: None   Collection Time: 08/31/23 11:58 AM   Specimen: Anterior Nasal Swab  Result Value Ref Range Status   SARS Coronavirus 2 by RT PCR NEGATIVE NEGATIVE Final   Influenza A by PCR NEGATIVE NEGATIVE Final   Influenza B by PCR NEGATIVE NEGATIVE Final    Comment: (NOTE) The Xpert Xpress SARS-CoV-2/FLU/RSV plus assay is intended as an aid in the diagnosis of influenza from Nasopharyngeal swab specimens and should not be used as a sole basis for treatment. Nasal washings and aspirates are unacceptable for Xpert Xpress SARS-CoV-2/FLU/RSV testing.  Fact Sheet for Patients: BloggerCourse.com  Fact Sheet for Healthcare Providers: SeriousBroker.it  This test is not yet approved or cleared by the [place] FDA and has been authorized for detection and/or diagnosis of SARS-CoV-2 by FDA under an Emergency Use Authorization (EUA). This EUA will remain in effect (meaning this test can be used) for the duration of the COVID-19 declaration under Section 564(b)(1) of the Act, 21 U.S.C. section 360bbb-3(b)(1), unless the authorization is terminated or revoked.     Resp Syncytial Virus by PCR NEGATIVE  NEGATIVE Final    Comment: (NOTE) Fact Sheet for Patients: BloggerCourse.com  Fact Sheet for Healthcare Providers: SeriousBroker.it  This test is not yet approved or cleared by the [place] FDA and has been authorized for detection and/or diagnosis of SARS-CoV-2 by FDA under an Emergency Use Authorization (  EUA). This EUA will remain in effect (meaning this test can be used) for the duration of the COVID-19 declaration under Section 564(b)(1) of the Act, 21 U.S.C. section 360bbb-3(b)(1), unless the authorization is terminated or revoked.  Performed at [facility], [address] [place], [place] [address]      Radiological Exams on Admission: CT Head Wo Contrast Result Date: 08/31/2023 CLINICAL DATA:  Initial evaluation for acute mental status change, unknown cause. EXAM: CT HEAD WITHOUT CONTRAST TECHNIQUE: Contiguous axial images were obtained from the base of the skull through the vertex without intravenous contrast. RADIATION DOSE REDUCTION: This exam was performed according to the departmental dose-optimization program which includes automated exposure control, adjustment of the mA and/or kV according to patient size and/or use of iterative reconstruction technique. COMPARISON:  Prior study from 01/03/2023 FINDINGS: Brain: Age-related cerebral atrophy. Patchy and confluent hypodensity involving the supratentorial cerebral white matter, consistent with chronic small vessel ischemic disease, moderate to advanced in nature. No acute intracranial hemorrhage. No acute large vessel territory infarct. No mass lesion or midline shift. Ventricular prominence related global parenchymal volume loss without hydrocephalus. No extra-axial fluid collection. Vascular: No abnormal hyperdense vessel. Calcified atherosclerosis present at skull base. Post treatment changes related to prior cavernous right ICA aneurysm treatment noted. Skull: Scalp  soft tissues within normal limits.  Calvarium intact. Sinuses/Orbits: Globes orbital soft tissues within normal limits. Paranasal sinuses are largely clear. No significant mastoid effusion. Other: None. IMPRESSION: 1. No acute intracranial abnormality. 2. 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 3. 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 Electronically Signed   By: Rise Mu M.D.   On: 08/31/2023 19:37   DG Chest 2 View Result Date: 08/31/2023 CLINICAL DATA:  Altered mental status EXAM: CHEST - 2 VIEW COMPARISON:  None Available. FINDINGS: Normal cardiac silhouette. No effusion, infiltrate or pneumothorax. Multiple posterior LEFT healed rib fractures. IMPRESSION: No acute cardiopulmonary process. Electronically Signed   By: Genevive Bi M.D.   On: 08/31/2023 14:32    EKG: Independently reviewed. Sinus rhythm.   Assessment/Plan   1. 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 Acute encephalopathy  - Presents with mild confusion and generalized weakness  - No focal deficits identified, no acute findings on head CT  - Use delirium precautions, anticipate improvement with treatment of hyponatremia   3. Hx of CVA  - Lipitor, Plavix   4. Seizure disorder  - Lamictal    5. Alcohol abuse   - Friend/HCPOA raises concern for patient's excessive daily drinking and hx of withdrawal  - Monitor with CIWA, use Ativan as-needed, supplement vitamins, consult TOC   6. Acute urinary retention - Catheter is being placed in ED, will continue for now   DVT prophylaxis: Lovenox  Code Status: Full  Level of Care: Level of care: Progressive Family Communication: Friend/HCPOA at bedside  Disposition Plan:  Patient is  from: Home  Anticipated d/c is to: TBD Anticipated d/c date is: 09/03/23  Patient currently: Pending improved sodium level and mental status  Consults called: None  Admission status: Inpatient (it will take >2 midnights to raise sodium at a safe rate to a safe level)   Briscoe Deutscher, MD [facility]  08/31/2023, 8:29 PM

## 2023-08-31 NOTE — ED Notes (Signed)
 Dr Loney Loh made aware of difficulty with catheter and that pt refused to have anyone try again. Also let her know he did have an incontinent episode and urinated a large amount

## 2023-08-31 NOTE — ED Notes (Signed)
 Spoke with Dr Loney Loh about foley catheter insertions, multiple nurses attempted with multiple sizes and were all unsuccessful. Pt stated he used a rigid 30fr at home. She stated to go get cath cart from OR and see if they have one. Attempted again with 57fr foley by 2 different nurses and both were unsuccessful. She stated for me to call coude cath team on 6N or 4W if needed but Pt does not want anyone else to try until we can get a rigid foley.

## 2023-08-31 NOTE — ED Notes (Signed)
 Bladder scan done. 193cc. Dr Loney Loh made aware

## 2023-09-01 DIAGNOSIS — E871 Hypo-osmolality and hyponatremia: Secondary | ICD-10-CM | POA: Diagnosis not present

## 2023-09-01 LAB — BASIC METABOLIC PANEL
Anion gap: 11 (ref 5–15)
Anion gap: 6 (ref 5–15)
Anion gap: 10 (ref 5–15)
BUN: 7 mg/dL — ABNORMAL LOW (ref 8–23)
BUN: 11 mg/dL (ref 8–23)
BUN: 10 mg/dL (ref 8–23)
CO2: 23 mmol/L (ref 22–32)
CO2: 23 mmol/L (ref 22–32)
CO2: 25 mmol/L (ref 22–32)
Calcium: 9.1 mg/dL (ref 8.9–10.3)
Calcium: 8.7 mg/dL — ABNORMAL LOW (ref 8.9–10.3)
Calcium: 9.3 mg/dL (ref 8.9–10.3)
Chloride: 88 mmol/L — ABNORMAL LOW (ref 98–111)
Chloride: 91 mmol/L — ABNORMAL LOW (ref 98–111)
Chloride: 86 mmol/L — ABNORMAL LOW (ref 98–111)
Creatinine, Ser: 0.8 mg/dL (ref 0.61–1.24)
Creatinine, Ser: 0.75 mg/dL (ref 0.61–1.24)
Creatinine, Ser: 0.84 mg/dL (ref 0.61–1.24)
GFR, Estimated: >60 mL/min (ref >60)
GFR, Estimated: >60 mL/min (ref >60)
GFR, Estimated: >60 mL/min (ref >60)
Glucose, Bld: 87 mg/dL (ref 70–99)
Glucose, Bld: 82 mg/dL (ref 70–99)
Glucose, Bld: 104 mg/dL — ABNORMAL HIGH (ref 70–99)
Potassium: 3.7 mmol/L (ref 3.5–5.1)
Potassium: 3.9 mmol/L (ref 3.5–5.1)
Potassium: 3.9 mmol/L (ref 3.5–5.1)
Sodium: 122 mmol/L — ABNORMAL LOW (ref 135–145)
Sodium: 120 mmol/L — ABNORMAL LOW (ref 135–145)
Sodium: 121 mmol/L — ABNORMAL LOW (ref 135–145)

## 2023-09-01 LAB — CBC
HCT: 36.3 % — ABNORMAL LOW (ref 39.0–52.0)
Hemoglobin: 13 g/dL (ref 13.0–17.0)
MCH: 31.7 pg (ref 26.0–34.0)
MCHC: 35.8 g/dL (ref 30.0–36.0)
MCV: 88.5 fL (ref 80.0–100.0)
Platelets: 256 10*3/uL (ref 150–400)
RBC: 4.1 MIL/uL — ABNORMAL LOW (ref 4.22–5.81)
RDW: 12.2 % (ref 11.5–15.5)
WBC: 9.6 10*3/uL (ref 4.0–10.5)
nRBC: 0 % (ref 0.0–0.2)

## 2023-09-01 LAB — SODIUM
Sodium: 123 mmol/L — ABNORMAL LOW (ref 135–145)
Sodium: 120 mmol/L — ABNORMAL LOW (ref 135–145)

## 2023-09-01 LAB — OSMOLALITY: Osmolality: 252 mosm/kg — ABNORMAL LOW (ref 275–295)

## 2023-09-01 MED ORDER — LIDOCAINE HCL URETHRAL/MUCOSAL 2 % EX GEL
1.0000 | Freq: Once | CUTANEOUS | Status: AC
  Administered 2023-09-01: 1 via TOPICAL
  Filled 2023-09-01: qty 11

## 2023-09-01 MED ORDER — LIDOCAINE 4 % EX CREA: TOPICAL_CREAM | Freq: Every day | CUTANEOUS | Status: DC | PRN

## 2023-09-01 NOTE — ED Notes (Signed)
 Urology PA [place] attempted and reported pt would need to be dilated to be I&O cathed, waiting for the OR to prepare scope for team

## 2023-09-01 NOTE — Consult Note (Signed)
 See attestation from prior note

## 2023-09-01 NOTE — Progress Notes (Signed)
 PROGRESS NOTE    Carlos Wise  ZOX:[id number] DOB: May 30, 1941 DOA: 08/31/2023 PCP: Ailene Ravel, MD   Brief Narrative:  HPI: Carlos Wise is a 83 y.o. male with medical history significant for alcohol abuse, seizure disorder, history of CVA, history of SDH, anxiety, and chronic hyponatremia who presents with confusion and generalized weakness.   Patient reports developing generalized weakness and "brain fog" this morning.  He called his friend, Victorino Dike, who is his HCPOA.  She reports that he was crying and seemed to be confused on the phone.  He denied headache, change in vision, focal numbness or weakness, SOB, or chest pain.   ED Course: Upon arrival to the ED, patient is found to be afebrile and saturating well on room air with normal heart rate and elevated blood pressure.  EKG demonstrates sinus rhythm and chest x-ray is negative for acute cardiopulmonary disease.  Head CT is negative for acute intracranial abnormality.  Labs are most notable for sodium 121, normal creatinine, normal WBC, normal ammonia, normal TSH, negative respiratory virus panel, and undetectable ethanol.   Patient was given a liter of NS, Ativan, and thiamine in the ED.  Assessment & Plan:   Principal Problem:   Acute on chronic hyponatremia Active Problems:   Alcohol use   HTN (hypertension)   HLD (hyperlipidemia)   Seizure disorder (HCC)   Encephalopathy acute   History of ischemic stroke  1.  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  Baseline creatinine between 127-130.  During that hospitalization, all offending medications causing SIADH were discontinued.  Patient this time presents with sodium of 122.  He received normal saline in the ED, sodium has dropped to 120 today.  Labs for [facility] are pending however he has been started on sodium bicarb tablets which I will continue and follow sodium every 6 hours.  I also wonder if alcoholism is  contributing to hyponatremia as well.  Await labs.  Monitor closely.   2. Acute encephalopathy  - Presents with mild confusion and generalized weakness, CT head unremarkable, patient alert and oriented and encephalopathy has resolved.   3. Hx of CVA  - Lipitor, Plavix    4. Seizure disorder  - Lamictal     5. Alcohol abuse   - Friend/HCPOA raises concern for patient's excessive daily drinking and hx of withdrawal  - Monitor with CIWA, use Ativan as-needed, supplement vitamins, consult TOC    6.  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  Patient has mentioned that he only gets 58 French rigid and does straight cath at home by himself and he is not allowing nursing staff to try again.  RN had been advised to consult with the coud catheter team.   DVT prophylaxis: enoxaparin (LOVENOX) injection 40 mg Start: 08/31/23 2100   Code Status: Full Code  Family Communication:  None present at bedside.  Plan of care discussed with patient in length and he/she verbalized understanding and agreed with it.  Status is: Inpatient Remains inpatient appropriate because: Dropping sodium   Estimated body mass index is 23.48 kg/m as calculated from the following:   Height as of this encounter: 5\' 9"  (1.753 m).   Weight as of this encounter: 72.1 kg.    Nutritional Assessment: Body mass index is 23.48 kg/m.Marland Kitchen Seen by dietician.  I agree with the  assessment and plan as outlined below: Nutrition Status:        . Skin Assessment: I have examined the patient's skin and I agree with the wound assessment as performed by the wound care RN as outlined below:    Consultants:  None none  Procedures:  None  Antimicrobials:  Anti-infectives (From admission, onward)    None         Subjective: Seen and examined in the ED.  He was fully alert and  oriented.  Appears to be very hard of hearing.  Complained of generalized weakness.  No other complaint.  He was able to verbalize the events that brought him to the emergency department and he asked reasonable questions about his sodium status.  Objective: Vitals:   09/01/23 0545 09/01/23 0700 09/01/23 0800 09/01/23 0823  BP: (!) 141/76 (!) 165/84 (!) 154/91   Pulse: 66 83 79 78  Resp: 13 12 18    Temp:      TempSrc:      SpO2: 98% 100% 100%   Weight:      Height:        Intake/Output Summary (Last 24 hours) at 09/01/2023 0847 Last data filed at 08/31/2023 2334 Gross per 24 hour  Intake 1125 ml  Output 300 ml  Net 825 ml   Filed Weights   08/31/23 1134  Weight: 72.1 kg    Examination:  General exam: Appears calm and comfortable  Respiratory system: Clear to auscultation. Respiratory effort normal. Cardiovascular system: S1 & S2 heard, RRR. No JVD, murmurs, rubs, gallops or clicks. No pedal edema. Gastrointestinal system: Abdomen is nondistended, soft and nontender. No organomegaly or masses felt. Normal bowel sounds heard. Central nervous system: Alert and oriented. No focal neurological deficits. Extremities: Symmetric 5 x 5 power. Skin: No rashes, lesions or ulcers Psychiatry: Judgement and insight appear normal. Mood & affect appropriate.    Data Reviewed: I have personally reviewed following labs and imaging studies  CBC: Recent Labs  Lab 08/31/23 1233 09/01/23 0829  WBC 8.6 9.6  NEUTROABS 6.3  --   HGB 13.3 13.0  HCT 37.2* 36.3*  MCV 88.2 88.5  PLT 269 256   Basic Metabolic Panel: Recent Labs  Lab 08/31/23 1233 09/01/23 0053 09/01/23 0636  NA 121* 122* 120*  K 4.4 3.7 3.9  CL 90* 88* 91*  CO2 22 23 23   GLUCOSE 111* 87 82  BUN 8 7* 11  CREATININE 0.75 0.80 0.75  CALCIUM 9.4 9.1 8.7*   GFR: Estimated Creatinine Clearance: 72.4 mL/min (by C-G formula based on SCr of 0.75 mg/dL). Liver Function Tests: Recent Labs  Lab 08/31/23 1233  AST 24   ALT 16  ALKPHOS 51  BILITOT 1.3*  PROT 7.7  ALBUMIN 4.5   No results for input(s): "LIPASE", "AMYLASE" in the last 168 hours. Recent Labs  Lab 08/31/23 1233  AMMONIA 18   Coagulation Profile: No results for input(s): "INR", "PROTIME" in the last 168 hours. Cardiac Enzymes: No results for input(s): "CKTOTAL", "CKMB", "CKMBINDEX", "TROPONINI" in the last 168 hours. BNP (last 3 results) No results for input(s): "PROBNP" in the last 8760 hours. HbA1C: No results for input(s): "HGBA1C" in the last 72 hours. CBG: No results for input(s): "GLUCAP" in the last 168 hours. Lipid Profile: No results for input(s): "CHOL", "HDL", "LDLCALC", "TRIG", "CHOLHDL", "LDLDIRECT" in the last 72 hours. Thyroid Function Tests: Recent Labs    08/31/23 1233  TSH 1.656   Anemia Panel: No results for input(s): "VITAMINB12", "FOLATE", "  FERRITIN", "TIBC", "IRON", "RETICCTPCT" in the last 72 hours. Sepsis Labs: No results for input(s): "PROCALCITON", "LATICACIDVEN" in the last 168 hours.  Recent Results (from the past 240 hours)  Resp panel by RT-PCR (RSV, Flu A&B, Covid) Anterior Nasal Swab     Status: None   Collection Time: 08/31/23 11:58 AM   Specimen: Anterior Nasal Swab  Result Value Ref Range Status   SARS Coronavirus 2 by RT PCR NEGATIVE NEGATIVE Final   Influenza A by PCR NEGATIVE NEGATIVE Final   Influenza B by PCR NEGATIVE NEGATIVE Final    Comment: (NOTE) The Xpert Xpress SARS-CoV-2/FLU/RSV plus assay is intended as an aid in the diagnosis of influenza from Nasopharyngeal swab specimens and should not be used as a sole basis for treatment. Nasal washings and aspirates are unacceptable for Xpert Xpress SARS-CoV-2/FLU/RSV testing.  Fact Sheet for Patients: BloggerCourse.com  Fact Sheet for Healthcare Providers: SeriousBroker.it  This test is not yet approved or cleared by the [place] FDA and has been authorized for detection  and/or diagnosis of SARS-CoV-2 by FDA under an Emergency Use Authorization (EUA). This EUA will remain in effect (meaning this test can be used) for the duration of the COVID-19 declaration under Section 564(b)(1) of the Act, 21 U.S.C. section 360bbb-3(b)(1), unless the authorization is terminated or revoked.     Resp Syncytial Virus by PCR NEGATIVE NEGATIVE Final    Comment: (NOTE) Fact Sheet for Patients: BloggerCourse.com  Fact Sheet for Healthcare Providers: SeriousBroker.it  This test is not yet approved or cleared by the [place] FDA and has been authorized for detection and/or diagnosis of SARS-CoV-2 by FDA under an Emergency Use Authorization (EUA). This EUA will remain in effect (meaning this test can be used) for the duration of the COVID-19 declaration under Section 564(b)(1) of the Act, 21 U.S.C. section 360bbb-3(b)(1), unless the authorization is terminated or revoked.  Performed at [facility], [address] [place], [place] [address]      Radiology Studies: CT Head Wo Contrast Result Date: 08/31/2023 CLINICAL DATA:  Initial evaluation for acute mental status change, unknown cause. EXAM: CT HEAD WITHOUT CONTRAST TECHNIQUE: Contiguous axial images were obtained from the base of the skull through the vertex without intravenous contrast. RADIATION DOSE REDUCTION: This exam was performed according to the departmental dose-optimization program which includes automated exposure control, adjustment of the mA and/or kV according to patient size and/or use of iterative reconstruction technique. COMPARISON:  Prior study from 01/03/2023 FINDINGS: Brain: Age-related cerebral atrophy. Patchy and confluent hypodensity involving the supratentorial cerebral white matter, consistent with chronic small vessel ischemic disease, moderate to advanced in nature. No acute intracranial hemorrhage. No acute large vessel territory  infarct. No mass lesion or midline shift. Ventricular prominence related global parenchymal volume loss without hydrocephalus. No extra-axial fluid collection. Vascular: No abnormal hyperdense vessel. Calcified atherosclerosis present at skull base. Post treatment changes related to prior cavernous right ICA aneurysm treatment noted. Skull: Scalp soft tissues within normal limits.  Calvarium intact. Sinuses/Orbits: Globes orbital soft tissues within normal limits. Paranasal sinuses are largely clear. No significant mastoid effusion. Other: None. IMPRESSION: 1. No acute intracranial abnormality. 2. 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 3. 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 Electronically Signed   By: Rise Mu M.D.   On: 08/31/2023 19:37   DG Chest 2 View Result Date: 08/31/2023 CLINICAL DATA:  Altered mental status EXAM: CHEST - 2 VIEW COMPARISON:  None Available. FINDINGS: Normal cardiac silhouette. No effusion, infiltrate or pneumothorax. Multiple posterior LEFT healed rib fractures. IMPRESSION: No acute cardiopulmonary process. Electronically Signed   By: Genevive Bi M.D.   On: 08/31/2023 14:32    Scheduled Meds:  atorvastatin  5 mg Oral QHS   busPIRone  15 mg Oral QHS   busPIRone  30 mg Oral Daily   clopidogrel  75 mg Oral Daily   enoxaparin (LOVENOX) injection  40 mg Subcutaneous Q24H   folic acid  1 mg Oral Daily   gabapentin  600 mg Oral QHS   lamoTRIgine  150 mg Oral BID   LORazepam  0-4 mg Intravenous Q4H   Followed by   Melene Muller ON 09/02/2023] LORazepam  0-4 mg Intravenous Q8H   melatonin  3 mg Oral QHS   multivitamin with minerals  1 tablet Oral Daily   nicotine  14 mg Transdermal Once   pantoprazole  40 mg Oral Daily   sodium chloride flush  3 mL Intravenous Q12H   sodium chloride  1 g Oral TID   tamsulosin  0.4 mg Oral QPC breakfast   thiamine  100 mg Oral Daily   Or    thiamine  100 mg Intravenous Daily   thiamine  100 mg Oral Daily   Continuous Infusions:   LOS: 1 day   Hughie Closs, MD [facility]  09/01/2023, 8:47 AM   *Please note that this is a verbal dictation therefore any spelling or grammatical errors are due to the "Dragon Medical One" system interpretation.  Please page via Amion and do not message via secure chat for urgent patient care matters. Secure chat can be used for non urgent patient care matters.  How to contact the [facility] Attending or Consulting provider 7A - 7P or covering provider during after hours 7P -7A, for this patient?  Check the care team in [facility] and look for a) attending/consulting [facility] provider listed and b) the [facility] team listed. Page or secure chat 7A-7P. Log into [email or web address] and use [place]'s universal password to access. If you do not have the password, please contact the hospital operator. Locate the [facility] provider you are looking for under [facility] and page to a number that you can be directly reached. If you still have difficulty reaching the provider, please page the [facility] (Director on Call) for the Hospitalists listed on amion for assistance.

## 2023-09-01 NOTE — Treatment Plan (Signed)
 Was contacted by primary team regarding difficult Foley catheter placement.  Patient is 83 year old male, significant for alcohol abuse, seizure disorder, chronic hyponatremia, prostate cancer status post radiation into ureteral stricture and self cath dependent.  Urology was consulted for Foley catheter placement.  I spoke with the length with the patient, regarding the indications for Foley catheter placement.  Patient was adamant that he would be able to catheterize himself he was at home, but he does not believe he is currently retaining, and would not like to have his Foley catheter replaced.  I had thorough discussion of risks regarding not having the catheter placed (acute urinary retention, further urethral stricturing, inability to monitor I/O's in setting of electrolyte abnormalites, etc).  After conversation, the patient still refused Foley catheter placement.  As such, will defer catheter placement at this time.  Patient was alert and oriented appropriately, and I believe that he does have the mental capacity to decline catheter.  Reassuringly, patient's bladder scans that have been performed in the emergency department in the last day have been less than 150 cc. His suprapubic area is also soft and non-tender. Reasonable to continue interval bladder scans Q6-Q8 hours, and urology can place the patient's clinical status changes and patient is amenable to placement.  Roby Lofts, MD Resident Physician [facility]

## 2023-09-01 NOTE — ED Notes (Signed)
 Bladder scan done. 132cc in bladder at this time.

## 2023-09-01 NOTE — ED Notes (Signed)
 Multiple attempts to I&O cath pt, caude team called and attempted-none successful

## 2023-09-01 NOTE — Consult Note (Signed)
 I have been asked to see the patient by Dr. Hughie Closs, for evaluation and management of urethral stricture.  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   Pt presented to ED after reporting generalized weakness earlier this AM. Upon arrival to ED, found to be severely hyponatremic. Attempt at foley catheter placement given history of urinary retention unsuccessful by nursing staff.  Bladder scans showing 111 and 132 earlier today.   Pt has history of urethral stricture s/p prostate cancer treated with radiation therapy. Last seen by urology in 2023. At that time, underwent balloon dilation successfully, and has been I/O BID ever since.   Pt reports no issues with catheterization at home. No recent urologic issues.   Denies any new frequency, urgency, dysuria, hematuria, etc.   Review of systems: A 12 point comprehensive review of systems was obtained and is negative unless otherwise stated in the history of present illness.  Patient Active Problem List   Diagnosis Date Noted   Acute exacerbation of CHF (congestive heart failure) (HCC) 04/29/2022   History of ischemic stroke    Encephalopathy acute    SDH (subdural hematoma) (HCC)    Dysphagia    Moderate protein malnutrition (HCC)    Seizure disorder (HCC) 02/28/2022   Tobacco use 02/28/2022   Depression 02/28/2022   Acute metabolic encephalopathy 11/22/2021   HTN (hypertension) 11/22/2021   HLD (hyperlipidemia) 11/22/2021   (HFpEF) heart failure with preserved ejection fraction (HCC) 11/22/2021   Altered mental status 02/07/2020   Aphasia 02/04/2020   Leukocytosis 02/04/2020   Acute on chronic hyponatremia 02/04/2020   Alcohol use 02/04/2020   Malignant neoplasm of prostate (HCC) 07/24/2014    No current facility-administered medications on file  prior to encounter.   Current Outpatient Medications on File Prior to Encounter  Medication Sig Dispense Refill   atorvastatin (LIPITOR) 10 MG tablet Take 5 mg by mouth at bedtime.     busPIRone (BUSPAR) 15 MG tablet Take 15 mg by mouth See admin instructions. 2 tab in am , 1 tab at pm     Cholecalciferol (VITAMIN D3 PO) Take 1 capsule by mouth daily.     clobetasol cream (TEMOVATE) 0.05 % Apply 1 Application topically daily as needed (itching scalp).     clopidogrel (PLAVIX) 75 MG tablet TAKE 1 TABLET BY MOUTH EVERY DAY 90 tablet 1   fluticasone (FLONASE) 50 MCG/ACT nasal spray Place 1 spray into both nostrils daily as needed for allergies.     folic acid (FOLVITE) 1 MG tablet Take 1 mg by mouth daily.     gabapentin (NEURONTIN) 300 MG capsule Take 600 mg by mouth at bedtime.     ketotifen (ALLERGY EYE DROPS) 0.025 % ophthalmic solution Place 1 drop into both eyes daily as needed (red eyes).     lamoTRIgine (LAMICTAL) 150 MG tablet Take 1 tablet (150 mg total) by mouth 2 (two) times daily. 180 tablet 3   melatonin 1 MG TABS tablet Take 1 mg by mouth at bedtime.     pantoprazole (PROTONIX) 40 MG tablet Take 40 mg by mouth daily.     sodium chloride (OCEAN) 0.65 % SOLN nasal spray Place 1 spray into both nostrils daily as needed for congestion.     sodium chloride 1 g tablet Take 1 g by mouth 3 (three) times daily.     tamsulosin (  FLOMAX) 0.4 MG CAPS capsule Take 0.4 mg by mouth.     thiamine (VITAMIN B1) 100 MG tablet Take 250 mg by mouth daily.     triamcinolone cream (KENALOG) 0.1 % Apply 1 Application topically daily as needed (itching skin).     Cyanocobalamin (VITAMIN B-12 PO) Take 1 tablet by mouth daily. (Patient not taking: Reported on 08/31/2023)      Past Medical History:  Diagnosis Date   Herpes zoster    Hyperlipemia    Hypertension    Intestinal polyp    hyperplastic   Prostate cancer (HCC)    S/P radiation therapy 12/12/2014 through 02/06/2015                                                       Prostate 7800 cGy in 40 sessions, seminal vesicles 5600 cGy in 40 sessions                           Seizure disorder [facility])     Past Surgical History:  Procedure Laterality Date   Carotid Artery Catherization     COLONOSCOPY     ESOPHAGEAL DILATION  07/30/2023   Procedure: ESOPHAGEAL DILATION;  Surgeon: [name], DO;  Location: [facility] ENDOSCOPY;  Service: Gastroenterology;;   ESOPHAGEAL DILATION  08/27/2023   Procedure: DILATION, ESOPHAGUS;  Surgeon: [name], DO;  Location: [facility] ENDOSCOPY;  Service: Gastroenterology;;   ESOPHAGOGASTRODUODENOSCOPY (EGD) WITH PROPOFOL N/A 07/30/2023   Procedure: ESOPHAGOGASTRODUODENOSCOPY (EGD) WITH PROPOFOL;  Surgeon: [name], DO;  Location: [facility] ENDOSCOPY;  Service: Gastroenterology;  Laterality: N/A;   ESOPHAGOGASTRODUODENOSCOPY (EGD) WITH PROPOFOL N/A 08/27/2023   Procedure: ESOPHAGOGASTRODUODENOSCOPY (EGD) WITH PROPOFOL;  Surgeon: [name], DO;  Location: [facility] ENDOSCOPY;  Service: Gastroenterology;  Laterality: N/A;   PROSTATE BIOPSY  06/27/2014    Social History   Tobacco Use   Smoking status: Every Day    Types: Cigarettes   Smokeless tobacco: Never   Tobacco comments:    Quit 30 years ago  Vaping Use   Vaping status: Every Day  Substance Use Topics   Alcohol use: Yes    Comment: 3 oz daily.none last 24hrs   Drug use: No    Family History  Problem Relation Age of Onset   Colon cancer Mother    Prostate cancer Father    Heart attack Father     PE: Vitals:   09/01/23 0923 09/01/23 1141 09/01/23 1405 09/01/23 1615  BP:  (!) 175/93  (!) 150/136  Pulse:  81  94  Resp:  20  17  Temp: 98.4 F (36.9 C)  98.6 F (37 C) 98.6 F (37 C)  TempSrc: Oral  Oral Oral  SpO2:  100%  99%  Weight:      Height:       Patient appears to be in no acute distress  patient is alert and oriented x3 Atraumatic normocephalic head No cervical or supraclavicular  lymphadenopathy appreciated No increased work of breathing, no audible wheezes/rhonchi Regular sinus rhythm/rate Abdomen is soft, nontender, nondistended, no CVA or suprapubic tenderness Lower extremities are symmetric without appreciable edema Grossly neurologically intact No identifiable skin lesions  Recent Labs    08/31/23 1233 09/01/23 0829  WBC 8.6 9.6  HGB 13.3 13.0  HCT 37.2* 36.3*  Recent Labs    09/01/23 0053 09/01/23 0636 09/01/23 0829 09/01/23 1200  NA 122* 120* 121* 123*  K 3.7 3.9 3.9  --   CL 88* 91* 86*  --   CO2 23 23 25   --   GLUCOSE 87 82 104*  --   BUN 7* 11 10  --   CREATININE 0.80 0.75 0.84  --   CALCIUM 9.1 8.7* 9.3  --    No results for input(s): "LABPT", "INR" in the last 72 hours. No results for input(s): "LABURIN" in the last 72 hours. Results for orders placed or performed during the hospital encounter of 08/31/23  Resp panel by RT-PCR (RSV, Flu A&B, Covid) Anterior Nasal Swab     Status: None   Collection Time: 08/31/23 11:58 AM   Specimen: Anterior Nasal Swab  Result Value Ref Range Status   SARS Coronavirus 2 by RT PCR NEGATIVE NEGATIVE Final   Influenza A by PCR NEGATIVE NEGATIVE Final   Influenza B by PCR NEGATIVE NEGATIVE Final    Comment: (NOTE) The Xpert Xpress SARS-CoV-2/FLU/RSV plus assay is intended as an aid in the diagnosis of influenza from Nasopharyngeal swab specimens and should not be used as a sole basis for treatment. Nasal washings and aspirates are unacceptable for Xpert Xpress SARS-CoV-2/FLU/RSV testing.  Fact Sheet for Patients: BloggerCourse.com  Fact Sheet for Healthcare Providers: SeriousBroker.it  This test is not yet approved or cleared by the [place] FDA and has been authorized for detection and/or diagnosis of SARS-CoV-2 by FDA under an Emergency Use Authorization (EUA). This EUA will remain in effect (meaning this test can be used) for the  duration of the COVID-19 declaration under Section 564(b)(1) of the Act, 21 U.S.C. section 360bbb-3(b)(1), unless the authorization is terminated or revoked.     Resp Syncytial Virus by PCR NEGATIVE NEGATIVE Final    Comment: (NOTE) Fact Sheet for Patients: BloggerCourse.com  Fact Sheet for Healthcare Providers: SeriousBroker.it  This test is not yet approved or cleared by the [place] FDA and has been authorized for detection and/or diagnosis of SARS-CoV-2 by FDA under an Emergency Use Authorization (EUA). This EUA will remain in effect (meaning this test can be used) for the duration of the COVID-19 declaration under Section 564(b)(1) of the Act, 21 U.S.C. section 360bbb-3(b)(1), unless the authorization is terminated or revoked.  Performed at [facility], [address], [place], [place] [address]     Imaging: none  Assessment/Plan:  Patient is 83 year old male, significant for alcohol abuse, seizure disorder, chronic hyponatremia, prostate cancer status post radiation into ureteral stricture and self cath dependent.   Urology was consulted for Foley catheter placement.   I spoke with the length with the patient, regarding the indications for Foley catheter placement.  Patient was adamant that he would be able to catheterize himself he was at home, but he does not believe he is currently retaining, and would not like to have his Foley catheter replaced.  I had thorough discussion of risks regarding not having the catheter placed (acute urinary retention, further urethral stricturing, inability to monitor I/O's in setting of electrolyte abnormalites, etc).  After conversation, the patient still refused Foley catheter placement.  As such, will defer catheter placement at this time.  Patient was alert and oriented appropriately, and I believe that he does have the mental capacity to decline catheter.   Reassuringly,  patient's bladder scans that have been performed in the emergency department in the last day have been  less than 150 cc. His suprapubic area is also soft and non-tender. Reasonable to continue interval bladder scans Q6-Q8 hours, and urology can place the patient's clinical status changes and patient is amenable to placement.   Roby Lofts, MD Resident Physician [facility]

## 2023-09-01 NOTE — ED Notes (Signed)
 Bladder scan performed 111cc at this time.

## 2023-09-02 DIAGNOSIS — E871 Hypo-osmolality and hyponatremia: Secondary | ICD-10-CM | POA: Diagnosis not present

## 2023-09-02 LAB — BASIC METABOLIC PANEL
Anion gap: 7 (ref 5–15)
Anion gap: 8 (ref 5–15)
Anion gap: 11 (ref 5–15)
BUN: 14 mg/dL (ref 8–23)
BUN: 35 mg/dL — ABNORMAL HIGH (ref 8–23)
BUN: 32 mg/dL — ABNORMAL HIGH (ref 8–23)
CO2: 24 mmol/L (ref 22–32)
CO2: 25 mmol/L (ref 22–32)
CO2: 21 mmol/L — ABNORMAL LOW (ref 22–32)
Calcium: 8.9 mg/dL (ref 8.9–10.3)
Calcium: 9.4 mg/dL (ref 8.9–10.3)
Calcium: 9 mg/dL (ref 8.9–10.3)
Chloride: 90 mmol/L — ABNORMAL LOW (ref 98–111)
Chloride: 90 mmol/L — ABNORMAL LOW (ref 98–111)
Chloride: 91 mmol/L — ABNORMAL LOW (ref 98–111)
Creatinine, Ser: 1.06 mg/dL (ref 0.61–1.24)
Creatinine, Ser: 1.21 mg/dL (ref 0.61–1.24)
Creatinine, Ser: 1.09 mg/dL (ref 0.61–1.24)
GFR, Estimated: >60 mL/min (ref >60)
GFR, Estimated: >60 mL/min (ref >60)
GFR, Estimated: >60 mL/min (ref >60)
Glucose, Bld: 86 mg/dL (ref 70–99)
Glucose, Bld: 117 mg/dL — ABNORMAL HIGH (ref 70–99)
Glucose, Bld: 137 mg/dL — ABNORMAL HIGH (ref 70–99)
Potassium: 4.1 mmol/L (ref 3.5–5.1)
Potassium: 4.5 mmol/L (ref 3.5–5.1)
Potassium: 3.8 mmol/L (ref 3.5–5.1)
Sodium: 121 mmol/L — ABNORMAL LOW (ref 135–145)
Sodium: 123 mmol/L — ABNORMAL LOW (ref 135–145)
Sodium: 123 mmol/L — ABNORMAL LOW (ref 135–145)

## 2023-09-02 LAB — CBC
HCT: 33.2 % — ABNORMAL LOW (ref 39.0–52.0)
Hemoglobin: 12.2 g/dL — ABNORMAL LOW (ref 13.0–17.0)
MCH: 32.2 pg (ref 26.0–34.0)
MCHC: 36.7 g/dL — ABNORMAL HIGH (ref 30.0–36.0)
MCV: 87.6 fL (ref 80.0–100.0)
Platelets: 254 10*3/uL (ref 150–400)
RBC: 3.79 MIL/uL — ABNORMAL LOW (ref 4.22–5.81)
RDW: 12.2 % (ref 11.5–15.5)
WBC: 9.5 10*3/uL (ref 4.0–10.5)
nRBC: 0 % (ref 0.0–0.2)

## 2023-09-02 LAB — SODIUM: Sodium: 121 mmol/L — ABNORMAL LOW (ref 135–145)

## 2023-09-02 MED ORDER — HYDRALAZINE HCL 20 MG/ML IJ SOLN: 10.0000 mg | INTRAMUSCULAR | Status: DC | PRN

## 2023-09-02 MED ORDER — IPRATROPIUM-ALBUTEROL 0.5-2.5 (3) MG/3ML IN SOLN: 3.0000 mL | RESPIRATORY_TRACT | Status: DC | PRN

## 2023-09-02 MED ORDER — GUAIFENESIN 100 MG/5ML PO LIQD: 5.0000 mL | ORAL | Status: DC | PRN

## 2023-09-02 MED ORDER — UREA 15 G PO PACK
15.0000 g | PACK | Freq: Two times a day (BID) | ORAL | Status: DC
  Administered 2023-09-02 – 2023-09-03 (×3): 15 g via ORAL
  Filled 2023-09-02 (×4): qty 1

## 2023-09-02 MED ORDER — METOPROLOL TARTRATE 5 MG/5ML IV SOLN: 5.0000 mg | INTRAVENOUS | Status: DC | PRN

## 2023-09-02 MED ORDER — TRAZODONE HCL 50 MG PO TABS: 50.0000 mg | ORAL_TABLET | Freq: Every evening | ORAL | Status: DC | PRN

## 2023-09-02 NOTE — Progress Notes (Signed)
 PROGRESS NOTE    Carlos Wise  ZOX:[id number] DOB: 30-Aug-1940 DOA: 08/31/2023 PCP: Ailene Ravel, MD    Brief Narrative:   83 year old with history of alcohol abuse, seizure disorder, CVA, SDH, anxiety, chronic hyponatremia admitted for confusion and generalized weakness.  Reporting brain fog on the day of admission.  Upon admission CT of the head negative, noted to have hyponatremia with sodium of 121.  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  Currently 121. 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  Urine sodium 142, urine osmolality 450, serum osmolality 252.  TSH is normal. -Will place patient on fluid restriction, urea.  If fails to rise, will consult nephrology as patient may require Samsca   Acute encephalopathy  Appears to have resolved.  CT head negative.   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  Patient self caths at home.  Seen by urology.   DVT prophylaxis: Lovenox    Code Status: Full Code Family Communication: HCPOA side Status is: Inpatient Remains inpatient appropriate because: Continue hospital stay for hyponatremia management    Subjective: Seen and examined at bedside.  Patient's mentation is much better.  This morning is alert awake oriented.  Patient's caregiver, Avon Gully is also at bedside   Examination:  General exam: Appears calm and comfortable   Respiratory system: Clear to auscultation. Respiratory effort normal. Cardiovascular system: S1 & S2 heard, RRR. No JVD, murmurs, rubs, gallops or clicks. No pedal edema. Gastrointestinal system: Abdomen is nondistended, soft and nontender. No organomegaly or masses felt. Normal bowel sounds heard. Central nervous system: Alert and oriented. No focal neurological deficits. Extremities: Symmetric 5 x 5 power. Skin: No rashes, lesions or ulcers Psychiatry: Judgement and insight appear normal. Mood & affect appropriate.                Diet Orders (From admission, onward)     Start     Ordered   09/02/23 0807  Diet regular Room service appropriate? Yes; Fluid consistency: Thin; Fluid restriction: 1200 mL Fluid  Diet effective now       Question Answer Comment  Room service appropriate? Yes   Fluid consistency: Thin   Fluid restriction: 1200 mL Fluid      09/02/23 0807            Objective: Vitals:   09/02/23 0400 09/02/23 0430 09/02/23 0500 09/02/23 0815  BP: (!) 74/49 106/62  (!) 100/58  Pulse: 67 69  69  Resp: (!) 22 17  14   Temp:    98 F (36.7 C)  TempSrc:    Oral  SpO2: 94% 95%  97%  Weight:   70.2 kg   Height:        Intake/Output Summary (Last 24 hours) at 09/02/2023 1118 Last data filed at 09/02/2023 0805 Gross per 24 hour  Intake 240 ml  Output 75 ml  Net 165 ml   [employer]  08/31/23 1134 09/01/23 1930 09/02/23 0500  Weight: 72.1 kg 70.7 kg 70.2 kg    Scheduled Meds:  atorvastatin  5 mg Oral QHS   busPIRone  15 mg Oral QHS   busPIRone  30 mg Oral Daily   clopidogrel  75 mg Oral Daily   enoxaparin (LOVENOX) injection  40 mg Subcutaneous Q24H   folic acid  1 mg Oral Daily   gabapentin  600 mg Oral QHS   lamoTRIgine  150 mg Oral BID   LORazepam  0-4 mg Intravenous Q4H   Followed by   LORazepam  0-4 mg Intravenous Q8H   melatonin  3 mg Oral QHS   multivitamin with minerals  1 tablet Oral Daily   pantoprazole  40 mg Oral Daily    sodium chloride flush  3 mL Intravenous Q12H   sodium chloride  1 g Oral TID   tamsulosin  0.4 mg Oral QPC breakfast   thiamine  100 mg Oral Daily   Or   thiamine  100 mg Intravenous Daily   urea  15 g Oral BID   Continuous Infusions:  Nutritional status     Body mass index is 22.85 kg/m.  Data Reviewed:   CBC: Recent Labs  Lab 08/31/23 1233 09/01/23 0829 09/02/23 0433  WBC 8.6 9.6 9.5  NEUTROABS 6.3  --   --   HGB 13.3 13.0 12.2*  HCT 37.2* 36.3* 33.2*  MCV 88.2 88.5 87.6  PLT 269 256 254   Basic Metabolic Panel: Recent Labs  Lab 08/31/23 1233 09/01/23 0053 09/01/23 0636 09/01/23 0829 09/01/23 1200 09/01/23 1949 09/02/23 0433  NA 121* 122* 120* 121* 123* 120* 121*  K 4.4 3.7 3.9 3.9  --   --  4.1  CL 90* 88* 91* 86*  --   --  90*  CO2 22 23 23 25   --   --  24  GLUCOSE 111* 87 82 104*  --   --  86  BUN 8 7* 11 10  --   --  14  CREATININE 0.75 0.80 0.75 0.84  --   --  1.06  CALCIUM 9.4 9.1 8.7* 9.3  --   --  8.9   GFR: Estimated Creatinine Clearance: 54.3 mL/min (by C-G formula based on SCr of 1.06 mg/dL). Liver Function Tests: Recent Labs  Lab 08/31/23 1233  AST 24  ALT 16  ALKPHOS 51  BILITOT 1.3*  PROT 7.7  ALBUMIN 4.5   No results for input(s): "LIPASE", "AMYLASE" in the last 168 hours. Recent Labs  Lab 08/31/23 1233  AMMONIA 18   Coagulation Profile: No results for input(s): "INR", "PROTIME" in the last 168 hours. Cardiac Enzymes: No results for input(s): "CKTOTAL", "CKMB", "CKMBINDEX", "TROPONINI" in the last 168 hours. BNP (last 3 results) No results for input(s): "PROBNP" in the last 8760 hours. HbA1C: No results for input(s): "HGBA1C" in the last 72 hours. CBG: No results for input(s): "GLUCAP" in the last 168 hours. Lipid Profile: No results for input(s): "CHOL", "HDL", "LDLCALC", "TRIG", "CHOLHDL", "LDLDIRECT" in the last 72 hours. Thyroid Function Tests: Recent Labs    08/31/23 1233  TSH 1.656   Anemia Panel: No  results for input(s): "VITAMINB12", "FOLATE", "FERRITIN", "TIBC", "IRON", "RETICCTPCT" in the last 72 hours. Sepsis Labs: No results for input(s): "PROCALCITON", "LATICACIDVEN" in the last 168 hours.  Recent Results (from the past 240 hours)  Resp panel by RT-PCR (RSV, Flu A&B, Covid) Anterior Nasal Swab     Status: None  Collection Time: 08/31/23 11:58 AM   Specimen: Anterior Nasal Swab  Result Value Ref Range Status   SARS Coronavirus 2 by RT PCR NEGATIVE NEGATIVE Final   Influenza A by PCR NEGATIVE NEGATIVE Final   Influenza B by PCR NEGATIVE NEGATIVE Final    Comment: (NOTE) The Xpert Xpress SARS-CoV-2/FLU/RSV plus assay is intended as an aid in the diagnosis of influenza from Nasopharyngeal swab specimens and should not be used as a sole basis for treatment. Nasal washings and aspirates are unacceptable for Xpert Xpress SARS-CoV-2/FLU/RSV testing.  Fact Sheet for Patients: BloggerCourse.com  Fact Sheet for Healthcare Providers: SeriousBroker.it  This test is not yet approved or cleared by the [place] FDA and has been authorized for detection and/or diagnosis of SARS-CoV-2 by FDA under an Emergency Use Authorization (EUA). This EUA will remain in effect (meaning this test can be used) for the duration of the COVID-19 declaration under Section 564(b)(1) of the Act, 21 U.S.C. section 360bbb-3(b)(1), unless the authorization is terminated or revoked.     Resp Syncytial Virus by PCR NEGATIVE NEGATIVE Final    Comment: (NOTE) Fact Sheet for Patients: BloggerCourse.com  Fact Sheet for Healthcare Providers: SeriousBroker.it  This test is not yet approved or cleared by the [place] FDA and has been authorized for detection and/or diagnosis of SARS-CoV-2 by FDA under an Emergency Use Authorization (EUA). This EUA will remain in effect (meaning this test can be used)  for the duration of the COVID-19 declaration under Section 564(b)(1) of the Act, 21 U.S.C. section 360bbb-3(b)(1), unless the authorization is terminated or revoked.  Performed at [facility], [address] [place], [place] [address]          Radiology Studies: CT Head Wo Contrast Result Date: 08/31/2023 CLINICAL DATA:  Initial evaluation for acute mental status change, unknown cause. EXAM: CT HEAD WITHOUT CONTRAST TECHNIQUE: Contiguous axial images were obtained from the base of the skull through the vertex without intravenous contrast. RADIATION DOSE REDUCTION: This exam was performed according to the departmental dose-optimization program which includes automated exposure control, adjustment of the mA and/or kV according to patient size and/or use of iterative reconstruction technique. COMPARISON:  Prior study from 01/03/2023 FINDINGS: Brain: Age-related cerebral atrophy. Patchy and confluent hypodensity involving the supratentorial cerebral white matter, consistent with chronic small vessel ischemic disease, moderate to advanced in nature. No acute intracranial hemorrhage. No acute large vessel territory infarct. No mass lesion or midline shift. Ventricular prominence related global parenchymal volume loss without hydrocephalus. No extra-axial fluid collection. Vascular: No abnormal hyperdense vessel. Calcified atherosclerosis present at skull base. Post treatment changes related to prior cavernous right ICA aneurysm treatment noted. Skull: Scalp soft tissues within normal limits.  Calvarium intact. Sinuses/Orbits: Globes orbital soft tissues within normal limits. Paranasal sinuses are largely clear. No significant mastoid effusion. Other: None. IMPRESSION: 1. No acute intracranial abnormality. 2. 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 3. 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 Electronically  Signed   By: Rise Mu M.D.   On: 08/31/2023 19:37   DG Chest 2 View Result Date: 08/31/2023 CLINICAL DATA:  Altered mental status EXAM: CHEST - 2 VIEW COMPARISON:  None Available. FINDINGS: Normal cardiac silhouette. No effusion, infiltrate or pneumothorax. Multiple posterior LEFT healed rib fractures. IMPRESSION: No acute cardiopulmonary process. Electronically Signed   By: Genevive Bi M.D.   On: 08/31/2023 14:32  LOS: 2 days   Time spent= 35 mins    Miguel Rota, MD [facility]  If 7PM-7AM, please contact night-coverage  09/02/2023, 11:18 AM

## 2023-09-02 NOTE — Plan of Care (Signed)
 Patient has declined intervention multiple times.  Went to check on him one more time before signing off.  Condom catheter was in place with nothing in the bag.  Preserved renal function. Patient felt no distress and again reiterated that he did not want Foley catheter.  Urology will sign off at this time.  Please feel free to contact us with patient's, concerns, or changes.

## 2023-09-02 NOTE — Hospital Course (Addendum)
 Brief Narrative:   83 year old with history of alcohol abuse, seizure disorder, CVA, SDH, anxiety, chronic hyponatremia admitted for confusion and generalized weakness.  Reporting brain fog on the day of admission.  Upon admission CT of the head negative, noted to have hyponatremia with sodium of 121.  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  Currently 121. 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  Urine sodium 142, urine osmolality 450, serum osmolality 252.  TSH is normal. -Will place patient on fluid restriction, urea.  If fails to rise, will consult nephrology as patient may require Samsca   Acute encephalopathy  Appears to have resolved.  CT head negative.   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  Patient self caths at home.  Seen by urology.   DVT prophylaxis: Lovenox    Code Status: Full Code Family Communication: HCPOA side Status is: Inpatient Remains inpatient appropriate because: Continue hospital stay for hyponatremia management    Subjective: Seen and examined at bedside.  Patient's mentation is much better.  This morning is alert awake oriented.  Patient's caregiver, Avon Gully is also at bedside   Examination:  General exam: Appears calm and comfortable  Respiratory system: Clear to auscultation. Respiratory effort normal. Cardiovascular system: S1 & S2 heard, RRR. No  JVD, murmurs, rubs, gallops or clicks. No pedal edema. Gastrointestinal system: Abdomen is nondistended, soft and nontender. No organomegaly or masses felt. Normal bowel sounds heard. Central nervous system: Alert and oriented. No focal neurological deficits. Extremities: Symmetric 5 x 5 power. Skin: No rashes, lesions or ulcers Psychiatry: Judgement and insight appear normal. Mood & affect appropriate.

## 2023-09-02 NOTE — Plan of Care (Signed)

## 2023-09-02 NOTE — Plan of Care (Signed)
 Pt has rested quietly throughout the night with no distress noted. Alert and oriented. On room air. SR on the monitor. Pt incontinent in brief. Bladder scans done. Pt turns self in bed. No complaints voiced.     Problem: Clinical Measurements: Goal: Respiratory complications will improve Outcome: Progressing Goal: Cardiovascular complication will be avoided Outcome: Progressing   Problem: Activity: Goal: Risk for activity intolerance will decrease Outcome: Progressing   Problem: Pain Managment: Goal: General experience of comfort will improve and/or be controlled Outcome: Progressing

## 2023-09-03 DIAGNOSIS — E871 Hypo-osmolality and hyponatremia: Secondary | ICD-10-CM | POA: Diagnosis not present

## 2023-09-03 LAB — CBC
HCT: 32.1 % — ABNORMAL LOW (ref 39.0–52.0)
Hemoglobin: 11.5 g/dL — ABNORMAL LOW (ref 13.0–17.0)
MCH: 31.7 pg (ref 26.0–34.0)
MCHC: 35.8 g/dL (ref 30.0–36.0)
MCV: 88.4 fL (ref 80.0–100.0)
Platelets: 241 10*3/uL (ref 150–400)
RBC: 3.63 MIL/uL — ABNORMAL LOW (ref 4.22–5.81)
RDW: 12.4 % (ref 11.5–15.5)
WBC: 8.8 10*3/uL (ref 4.0–10.5)
nRBC: 0 % (ref 0.0–0.2)

## 2023-09-03 LAB — BASIC METABOLIC PANEL
Anion gap: 11 (ref 5–15)
Anion gap: 8 (ref 5–15)
Anion gap: 11 (ref 5–15)
BUN: 40 mg/dL — ABNORMAL HIGH (ref 8–23)
BUN: 44 mg/dL — ABNORMAL HIGH (ref 8–23)
BUN: 33 mg/dL — ABNORMAL HIGH (ref 8–23)
CO2: 22 mmol/L (ref 22–32)
CO2: 26 mmol/L (ref 22–32)
CO2: 22 mmol/L (ref 22–32)
Calcium: 9 mg/dL (ref 8.9–10.3)
Calcium: 9.1 mg/dL (ref 8.9–10.3)
Calcium: 8.9 mg/dL (ref 8.9–10.3)
Chloride: 90 mmol/L — ABNORMAL LOW (ref 98–111)
Chloride: 93 mmol/L — ABNORMAL LOW (ref 98–111)
Chloride: 93 mmol/L — ABNORMAL LOW (ref 98–111)
Creatinine, Ser: 1.01 mg/dL (ref 0.61–1.24)
Creatinine, Ser: 1.04 mg/dL (ref 0.61–1.24)
Creatinine, Ser: 1.08 mg/dL (ref 0.61–1.24)
GFR, Estimated: >60 mL/min (ref >60)
GFR, Estimated: >60 mL/min (ref >60)
GFR, Estimated: >60 mL/min (ref >60)
Glucose, Bld: 95 mg/dL (ref 70–99)
Glucose, Bld: 115 mg/dL — ABNORMAL HIGH (ref 70–99)
Glucose, Bld: 127 mg/dL — ABNORMAL HIGH (ref 70–99)
Potassium: 3.8 mmol/L (ref 3.5–5.1)
Potassium: 4 mmol/L (ref 3.5–5.1)
Potassium: 4.1 mmol/L (ref 3.5–5.1)
Sodium: 123 mmol/L — ABNORMAL LOW (ref 135–145)
Sodium: 127 mmol/L — ABNORMAL LOW (ref 135–145)
Sodium: 126 mmol/L — ABNORMAL LOW (ref 135–145)

## 2023-09-03 LAB — MAGNESIUM: Magnesium: 2 mg/dL (ref 1.7–2.4)

## 2023-09-03 NOTE — Plan of Care (Signed)

## 2023-09-03 NOTE — Care Management Important Message (Signed)
 Important Message  Patient Details  Name: Carlos Wise MRN: [id number] Date of Birth: 04/21/1941   Important Message Given:  Yes - Medicare IM     Dorena Bodo 09/03/2023, 3:23 PM

## 2023-09-03 NOTE — Evaluation (Signed)
 Physical Therapy Evaluation Patient Details Name: Carlos Wise MRN: [id number] DOB: Apr 23, 1941 Today's Date: 09/03/2023  History of Present Illness  83 y.o. male presents to [facility] hospital on 08/31/2023 with confusion and generalized weakness. 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 PMH includes alcohol abuse, seizure disorder, CVA, SDH, anxiety.  Clinical Impression  Pt presents to PT with deficits in strength, power, functional mobility, balance. Pt with difficulty standing initially due to posterior lean but demonstrates success after PT verbal cues to increase anterior weight shift. Once standing pt ambulates well with support of RW. PT will follow to improve LE strength and balance. PT recommends HHPT at the time of discharge.        If plan is discharge home, recommend the following: A little help with walking and/or transfers;A little help with bathing/dressing/bathroom;Assistance with cooking/housework;Assist for transportation;Help with stairs or ramp for entrance   Can travel by private vehicle        Equipment Recommendations None recommended by PT  Recommendations for Other Services       Functional Status Assessment Patient has had a recent decline in their functional status and demonstrates the ability to make significant improvements in function in a reasonable and predictable amount of time.     Precautions / Restrictions Precautions Precautions: Fall Recall of Precautions/Restrictions: Intact Restrictions Weight Bearing Restrictions Per Provider Order: No      Mobility  Bed Mobility Overal bed mobility: Needs Assistance Bed Mobility: Supine to Sit     Supine to sit: Contact guard          Transfers Overall transfer level: Needs assistance Equipment used: Rolling walker (2 wheels) Transfers: Sit to/from Stand Sit to Stand: Min assist           General transfer comment: verbal and visual cues to increase anterior weight shift as pt with posterior lean  with initial 4 attempts at standing    Ambulation/Gait Ambulation/Gait assistance: Contact guard assist Gait Distance (Feet): 150 Feet Assistive device: Rolling walker (2 wheels) Gait Pattern/deviations: Step-through pattern Gait velocity: reduced Gait velocity interpretation: <1.8 ft/sec, indicate of risk for recurrent falls   General Gait Details: slowed step-through gait  Stairs            Wheelchair Mobility     Tilt Bed    Modified Rankin (Stroke Patients Only)       Balance Overall balance assessment: Needs assistance Sitting-balance support: No upper extremity supported, Feet supported Sitting balance-Leahy Scale: Good     Standing balance support: Bilateral upper extremity supported, Reliant on assistive device for balance Standing balance-Leahy Scale: Poor                               Pertinent Vitals/Pain Pain Assessment Pain Assessment: No/denies pain    Home Living Family/patient expects to be discharged to:: Private residence Living Arrangements: Alone Available Help at Discharge: Personal care attendant (PCA 8 hours daily) Type of Home: House Home Access: Ramped entrance;Other (comment) (stair lift from basement entrance)     Alternate Level Stairs-Number of Steps: stair lift to basement Home Layout: Multi-level Home Equipment: Rolling Walker (2 wheels);Cane - single point;Shower seat;Rollator (4 wheels);Grab bars - toilet;Grab bars - tub/shower;Hand held shower head      Prior Function Prior Level of Function : Needs assist             Mobility Comments: ambulatory with RW in the home and [facility] in  the community ADLs Comments: assistance for IADLs from PCA     Extremity/Trunk Assessment   Upper Extremity Assessment Upper Extremity Assessment: Overall WFL for tasks assessed    Lower Extremity Assessment Lower Extremity Assessment: Generalized weakness    Cervical / Trunk Assessment Cervical / Trunk Assessment:  Normal  Communication   Communication Communication: Impaired Factors Affecting Communication: Hearing impaired    Cognition Arousal: Alert Behavior During Therapy: WFL for tasks assessed/performed   PT - Cognitive impairments: No apparent impairments                         Following commands: Intact       Cueing Cueing Techniques: Verbal cues     General Comments General comments (skin integrity, edema, etc.): VSS on RA    Exercises     Assessment/Plan    PT Assessment Patient needs continued PT services  PT Problem List Decreased strength;Decreased activity tolerance;Decreased balance;Decreased mobility       PT Treatment Interventions DME instruction;Stair training;Gait training;Functional mobility training;Therapeutic activities;Therapeutic exercise;Neuromuscular re-education;Balance training;Patient/family education    PT Goals (Current goals can be found in the Care Plan section)  Acute Rehab PT Goals Patient Stated Goal: to return home PT Goal Formulation: With patient Time For Goal Achievement: 09/17/23 Potential to Achieve Goals: Good    Frequency Min 3X/week     Co-evaluation               AM-PAC PT "6 Clicks" Mobility  Outcome Measure Help needed turning from your back to your side while in a flat bed without using bedrails?: A Little Help needed moving from lying on your back to sitting on the side of a flat bed without using bedrails?: A Little Help needed moving to and from a bed to a chair (including a wheelchair)?: A Little Help needed standing up from a chair using your arms (e.g., wheelchair or bedside chair)?: A Little Help needed to walk in hospital room?: A Little Help needed climbing 3-5 steps with a railing? : A Lot 6 Click Score: 17    End of Session Equipment Utilized During Treatment: Gait belt Activity Tolerance: Patient tolerated treatment well Patient left: in chair;with call bell/phone within reach;with chair  alarm set Nurse Communication: Mobility status PT Visit Diagnosis: Other abnormalities of gait and mobility (R26.89)    Time: 1427-1450 PT Time Calculation (min) (ACUTE ONLY): 23 min   Charges:   PT Evaluation $PT Eval Low Complexity: 1 Low   PT General Charges $$ ACUTE PT VISIT: 1 Visit         Arlyss Gandy, PT, DPT Acute Rehabilitation Office [phone]   Arlyss Gandy 09/03/2023, 3:58 PM

## 2023-09-03 NOTE — Progress Notes (Addendum)
 PROGRESS NOTE    Carlos Wise  QMV:[id number] DOB: 1940/09/06 DOA: 08/31/2023 PCP: Ailene Ravel, MD    Brief Narrative:   83 year old with history of alcohol abuse, seizure disorder, CVA, SDH, anxiety, chronic hyponatremia admitted for confusion and generalized weakness.  Reporting brain fog on the day of admission.  Upon admission CT of the head negative, noted to have hyponatremia with sodium of 121.  After reviewing urine studies and osmolality there is suspicion for SIADH therefore placed on fluid restriction.  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  Currently 123. 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  Urine sodium 142, urine osmolality 450, serum osmolality 252.  TSH is normal.  No obvious neurodeficits at this time.  Continue fluid restriction.  If necessary we can consult nephrology Monitor BUN, ptn is currently on Urea.   Addendum 250pm: Na improved to 127. Will stop Urea. Cont Na monitoring, recheck tomorrow.    Acute encephalopathy  Appears to have resolved.  CT head negative.   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  Patient self caths at home.  Seen by urology.  Declining any other intervention at this time.  PT/OT   DVT prophylaxis: Lovenox    Code Status: Full Code Family Communication: HCPOA updated Status is: Inpatient Remains inpatient appropriate  because: Continue hospital stay for hyponatremia management.  Hopefully discharge in next 24-48 hours depending on sodium levels.    Subjective:  NO complaints. Overall doing better, and feeling stronger.   Examination:  General exam: Appears calm and comfortable  Respiratory system: Clear to auscultation. Respiratory effort normal. Cardiovascular system: S1 & S2 heard, RRR. No JVD, murmurs, rubs, gallops or clicks. No pedal edema. Gastrointestinal system: Abdomen is nondistended, soft and nontender. No organomegaly or masses felt. Normal bowel sounds heard. Central nervous system: Alert and oriented. No focal neurological deficits. Extremities: Symmetric 5 x 5 power. Skin: No rashes, lesions or ulcers Psychiatry: Judgement and insight appear normal. Mood & affect appropriate.                Diet Orders (From admission, onward)     Start     Ordered   09/02/23 0807  Diet regular Room service appropriate? Yes; Fluid consistency: Thin; Fluid restriction: 1200 mL Fluid  Diet effective now       Question Answer Comment  Room service appropriate? Yes   Fluid consistency: Thin   Fluid restriction: 1200 mL Fluid      09/02/23 0807            Objective: Vitals:   09/03/23 0433 09/03/23 0800 09/03/23 0803 09/03/23 1148  BP: (!) 97/54 106/64 106/64 100/61  Pulse:  64 64 73  Resp: 20  (!) 8 19  Temp: 97.7 F (36.5 C)  98.2 F (36.8 C) 97.9 F (36.6 C)  TempSrc: Oral  Oral Oral  SpO2:   96% 96%  Weight:  Height:        Intake/Output Summary (Last 24 hours) at 09/03/2023 1452 Last data filed at 09/03/2023 0436 Gross per 24 hour  Intake 250 ml  Output 800 ml  Net -550 ml   Filed Weights   08/31/23 1134 09/01/23 1930 09/02/23 0500  Weight: 72.1 kg 70.7 kg 70.2 kg    Scheduled Meds:  atorvastatin  5 mg Oral QHS   busPIRone  15 mg Oral QHS   busPIRone  30 mg Oral Daily   clopidogrel  75 mg Oral Daily   enoxaparin (LOVENOX) injection  40 mg  Subcutaneous Q24H   folic acid  1 mg Oral Daily   gabapentin  600 mg Oral QHS   lamoTRIgine  150 mg Oral BID   LORazepam  0-4 mg Intravenous Q8H   melatonin  3 mg Oral QHS   multivitamin with minerals  1 tablet Oral Daily   pantoprazole  40 mg Oral Daily   sodium chloride flush  3 mL Intravenous Q12H   sodium chloride  1 g Oral TID   tamsulosin  0.4 mg Oral QPC breakfast   thiamine  100 mg Oral Daily   urea  15 g Oral BID   Continuous Infusions:  Nutritional status     Body mass index is 22.85 kg/m.  Data Reviewed:   CBC: Recent Labs  Lab 08/31/23 1233 09/01/23 0829 09/02/23 0433 09/03/23 0344  WBC 8.6 9.6 9.5 8.8  NEUTROABS 6.3  --   --   --   HGB 13.3 13.0 12.2* 11.5*  HCT 37.2* 36.3* 33.2* 32.1*  MCV 88.2 88.5 87.6 88.4  PLT 269 256 254 241   Basic Metabolic Panel: Recent Labs  Lab 09/02/23 0433 09/02/23 1222 09/02/23 1709 09/02/23 2043 09/03/23 0344 09/03/23 1249  NA 121* 123* 121* 123* 123* 127*  K 4.1 4.5  --  3.8 3.8 4.0  CL 90* 90*  --  91* 90* 93*  CO2 24 25  --  21* 22 26  GLUCOSE 86 117*  --  137* 95 115*  BUN 14 35*  --  32* 40* 44*  CREATININE 1.06 1.21  --  1.09 1.01 1.04  CALCIUM 8.9 9.4  --  9.0 9.0 9.1  MG  --   --   --   --  2.0  --    GFR: Estimated Creatinine Clearance: 55.3 mL/min (by C-G formula based on SCr of 1.04 mg/dL). Liver Function Tests: Recent Labs  Lab 08/31/23 1233  AST 24  ALT 16  ALKPHOS 51  BILITOT 1.3*  PROT 7.7  ALBUMIN 4.5   No results for input(s): "LIPASE", "AMYLASE" in the last 168 hours. Recent Labs  Lab 08/31/23 1233  AMMONIA 18   Coagulation Profile: No results for input(s): "INR", "PROTIME" in the last 168 hours. Cardiac Enzymes: No results for input(s): "CKTOTAL", "CKMB", "CKMBINDEX", "TROPONINI" in the last 168 hours. BNP (last 3 results) No results for input(s): "PROBNP" in the last 8760 hours. HbA1C: No results for input(s): "HGBA1C" in the last 72 hours. CBG: No results for  input(s): "GLUCAP" in the last 168 hours. Lipid Profile: No results for input(s): "CHOL", "HDL", "LDLCALC", "TRIG", "CHOLHDL", "LDLDIRECT" in the last 72 hours. Thyroid Function Tests: No results for input(s): "TSH", "T4TOTAL", "FREET4", "T3FREE", "THYROIDAB" in the last 72 hours.  Anemia Panel: No results for input(s): "VITAMINB12", "FOLATE", "FERRITIN", "TIBC", "IRON", "RETICCTPCT" in the last 72 hours. Sepsis Labs: No results for input(s): "PROCALCITON", "LATICACIDVEN" in the last 168 hours.  Recent Results (from the past 240 hours)  Resp panel by RT-PCR (RSV, Flu A&B, Covid) Anterior Nasal Swab     Status: None   Collection Time: 08/31/23 11:58 AM   Specimen: Anterior Nasal Swab  Result Value Ref Range Status   SARS Coronavirus 2 by RT PCR NEGATIVE NEGATIVE Final   Influenza A by PCR NEGATIVE NEGATIVE Final   Influenza B by PCR NEGATIVE NEGATIVE Final    Comment: (NOTE) The Xpert Xpress SARS-CoV-2/FLU/RSV plus assay is intended as an aid in the diagnosis of influenza from Nasopharyngeal swab specimens and should not be used as a sole basis for treatment. Nasal washings and aspirates are unacceptable for Xpert Xpress SARS-CoV-2/FLU/RSV testing.  Fact Sheet for Patients: BloggerCourse.com  Fact Sheet for Healthcare Providers: SeriousBroker.it  This test is not yet approved or cleared by the [place] FDA and has been authorized for detection and/or diagnosis of SARS-CoV-2 by FDA under an Emergency Use Authorization (EUA). This EUA will remain in effect (meaning this test can be used) for the duration of the COVID-19 declaration under Section 564(b)(1) of the Act, 21 U.S.C. section 360bbb-3(b)(1), unless the authorization is terminated or revoked.     Resp Syncytial Virus by PCR NEGATIVE NEGATIVE Final    Comment: (NOTE) Fact Sheet for Patients: BloggerCourse.com  Fact Sheet for Healthcare  Providers: SeriousBroker.it  This test is not yet approved or cleared by the [place] FDA and has been authorized for detection and/or diagnosis of SARS-CoV-2 by FDA under an Emergency Use Authorization (EUA). This EUA will remain in effect (meaning this test can be used) for the duration of the COVID-19 declaration under Section 564(b)(1) of the Act, 21 U.S.C. section 360bbb-3(b)(1), unless the authorization is terminated or revoked.  Performed at [facility], [address] [place], [place] [address]          Radiology Studies: No results found.         LOS: 3 days   Time spent= 35 mins    Miguel Rota, MD [facility]  If 7PM-7AM, please contact night-coverage  09/03/2023, 2:52 PM

## 2023-09-04 DIAGNOSIS — E871 Hypo-osmolality and hyponatremia: Secondary | ICD-10-CM | POA: Diagnosis not present

## 2023-09-04 LAB — BASIC METABOLIC PANEL
Anion gap: 8 (ref 5–15)
BUN: 21 mg/dL (ref 8–23)
CO2: 25 mmol/L (ref 22–32)
Calcium: 9.1 mg/dL (ref 8.9–10.3)
Chloride: 95 mmol/L — ABNORMAL LOW (ref 98–111)
Creatinine, Ser: 0.96 mg/dL (ref 0.61–1.24)
GFR, Estimated: >60 mL/min (ref >60)
Glucose, Bld: 96 mg/dL (ref 70–99)
Potassium: 4.3 mmol/L (ref 3.5–5.1)
Sodium: 128 mmol/L — ABNORMAL LOW (ref 135–145)

## 2023-09-04 LAB — CBC
HCT: 32.7 % — ABNORMAL LOW (ref 39.0–52.0)
Hemoglobin: 11.8 g/dL — ABNORMAL LOW (ref 13.0–17.0)
MCH: 32.2 pg (ref 26.0–34.0)
MCHC: 36.1 g/dL — ABNORMAL HIGH (ref 30.0–36.0)
MCV: 89.1 fL (ref 80.0–100.0)
Platelets: 245 10*3/uL (ref 150–400)
RBC: 3.67 MIL/uL — ABNORMAL LOW (ref 4.22–5.81)
RDW: 12.4 % (ref 11.5–15.5)
WBC: 6.5 10*3/uL (ref 4.0–10.5)
nRBC: 0 % (ref 0.0–0.2)

## 2023-09-04 LAB — OSMOLALITY: Osmolality: 276 mosm/kg (ref 275–295)

## 2023-09-04 LAB — URIC ACID: Uric Acid, Serum: 3.8 mg/dL (ref 3.7–8.6)

## 2023-09-04 LAB — OSMOLALITY, URINE: Osmolality, Ur: 498 mosm/kg (ref 300–900)

## 2023-09-04 LAB — MAGNESIUM: Magnesium: 2.1 mg/dL (ref 1.7–2.4)

## 2023-09-04 LAB — CREATININE, URINE, RANDOM: Creatinine, Urine: 68 mg/dL

## 2023-09-04 LAB — BRAIN NATRIURETIC PEPTIDE: B Natriuretic Peptide: 20.8 pg/mL (ref 0.0–100.0)

## 2023-09-04 LAB — SODIUM, URINE, RANDOM: Sodium, Ur: 29 mmol/L

## 2023-09-04 NOTE — Progress Notes (Addendum)
 Patient refused vitals, he stated he is sleeping

## 2023-09-04 NOTE — Evaluation (Signed)
 Occupational Therapy Evaluation Patient Details Name: Carlos Wise MRN: [id number] DOB: [birth year] Today's Date: 09/04/2023   History of Present Illness   83 y.o. male presents to [facility] hospital on 08/31/2023 with confusion and generalized weakness. 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 PMH includes alcohol abuse, seizure disorder, CVA, SDH, anxiety.     Clinical Impressions Patient reports living alone on main level  with chair lift to access garage/basement and reports that he has a caregiver that comes in the morning to complete IADLs and has a caregiver that stays with him at night.  Independent in ADLs and uses RW at baseline.  Patient currently demo's deficits in strength, safety, ADLs and functional mobility with increased risk of falling.  Patient currently requires minA  for bed mobility and mod A for functional mobility along bed side 2/2 decreased safety with RW with verbal cues for staying within RW and correct technique and posterior lean.  Max A for LB ADLs 2/2 unsteadiness on feet and posterior lean.  Patient would benefit from additional OT intervention to address functional deficits listed above in order for patient to return to PLOF.  Patient would benefit from [facility] with 24/7 A versus SNF.     If plan is discharge home, recommend the following:   A little help with walking and/or transfers;Direct supervision/assist for medications management;Direct supervision/assist for financial management;Help with stairs or ramp for entrance;Supervision due to cognitive status;Assistance with cooking/housework;A little help with bathing/dressing/bathroom     Functional Status Assessment   Patient has had a recent decline in their functional status and demonstrates the ability to make significant improvements in function in a reasonable and predictable amount of time.     Equipment Recommendations   None recommended by OT     Recommendations for Other Services          Precautions/Restrictions   Precautions Precautions: Fall Recall of Precautions/Restrictions: Intact Restrictions Weight Bearing Restrictions Per Provider Order: No     Mobility Bed Mobility Overal bed mobility: Needs Assistance Bed Mobility: Supine to Sit     Supine to sit: Min assist          Transfers Overall transfer level: Needs assistance Equipment used: Rolling walker (2 wheels) Transfers: Sit to/from Stand Sit to Stand: Min assist           General transfer comment:  (patrient noted to have posterior lean when standing with verbal cues to self correction  Patient had 1 LOB backward and patient sititng back onto bed.)      Balance Overall balance assessment: Needs assistance Sitting-balance support: No upper extremity supported, Feet supported       Standing balance support: Bilateral upper extremity supported, Reliant on assistive device for balance                               ADL either performed or assessed with clinical judgement   ADL Overall ADL's : Needs assistance/impaired Eating/Feeding: Set up   Grooming: Set up;Sitting   Upper Body Bathing: Sitting;Set up   Lower Body Bathing: Moderate assistance;Sitting/lateral leans   Upper Body Dressing : Minimal assistance;Sitting   Lower Body Dressing: Maximal assistance (sitting to don socks)   Toilet Transfer: Moderate assistance;BSC/3in1   Toileting- Clothing Manipulation and Hygiene: Maximal assistance (due to patient being unsyeady on feet and posterior lean along with shuffled gait  Verbal cues for safe use of RW)  Functional mobility during ADLs: Moderate assistance;Rollator (4 wheels)       Vision Baseline Vision/History: 1 Wears glasses Ability to See in Adequate Light: 0 Adequate Patient Visual Report: No change from baseline Vision Assessment?: No apparent visual deficits     Perception Perception: Within Functional Limits       Praxis          Pertinent Vitals/Pain Pain Assessment Pain Assessment: No/denies pain     Extremity/Trunk Assessment Upper Extremity Assessment Upper Extremity Assessment: Generalized weakness   Lower Extremity Assessment Lower Extremity Assessment: Defer to PT evaluation   Cervical / Trunk Assessment Cervical / Trunk Assessment: Normal   Communication Communication Communication: Impaired Factors Affecting Communication: Hearing impaired   Cognition Arousal: Alert Behavior During Therapy: WFL for tasks assessed/performed Cognition: Cognition impaired   Orientation impairments: Time Awareness: Intellectual awareness intact   Attention impairment (select first level of impairment): Focused attention                     Following commands: Intact       Cueing  General Comments   Cueing Techniques: Verbal cues      Exercises     Shoulder Instructions      Home Living Family/patient expects to be discharged to:: Private residence Living Arrangements: Alone Available Help at Discharge: Personal care attendant Type of Home: House Home Access: Ramped entrance;Other (comment)     Home Layout: Multi-level Alternate Level Stairs-Number of Steps: stair lift to basement   Bathroom Shower/Tub: Producer, television/film/video: Standard Bathroom Accessibility: Yes How Accessible: Accessible via walker Home Equipment: Rolling Walker (2 wheels);Cane - single point;Shower seat;Rollator (4 wheels);Grab bars - toilet;Grab bars - tub/shower;Hand held shower head          Prior Functioning/Environment Prior Level of Function : Needs assist  Cognitive Assist : ADLs (cognitive)   ADLs (Cognitive): Set up cues Physical Assist : Mobility (physical) Mobility (physical): Transfers   Mobility Comments: ambulatory with RW in the home and [facility] in the community ADLs Comments: assistance for IADLs from PCA    OT Problem List: Decreased strength;Decreased activity  tolerance;Impaired balance (sitting and/or standing);Decreased cognition;Decreased coordination;Decreased safety awareness   OT Treatment/Interventions: Therapeutic exercise;Self-care/ADL training;DME and/or AE instruction;Therapeutic activities;Patient/family education      OT Goals(Current goals can be found in the care plan section)   Acute Rehab OT Goals OT Goal Formulation: With patient Time For Goal Achievement: 09/17/23 Potential to Achieve Goals: Good   OT Frequency:  Min 1X/week    Co-evaluation              AM-PAC OT "6 Clicks" Daily Activity     Outcome Measure Help from another person eating meals?: A Little Help from another person taking care of personal grooming?: A Little Help from another person toileting, which includes using toliet, bedpan, or urinal?: A Lot Help from another person bathing (including washing, rinsing, drying)?: A Lot Help from another person to put on and taking off regular upper body clothing?: A Little Help from another person to put on and taking off regular lower body clothing?: A Lot 6 Click Score: 15   End of Session Equipment Utilized During Treatment: Gait belt;Rolling walker (2 wheels) Nurse Communication: Mobility status  Activity Tolerance: Patient tolerated treatment well Patient left: in bed;with call bell/phone within reach;with bed alarm set  OT Visit Diagnosis: Unsteadiness on feet (R26.81);Muscle weakness (generalized) (M62.81);History of falling (Z91.81)  Time: 1204-1227 OT Time Calculation (min): 23 min Charges:  OT General Charges $OT Visit: 1 Visit OT Evaluation $OT Eval Moderate Complexity: 1 Mod  Governor Specking OT/L  Denice Paradise 09/04/2023, 2:26 PM

## 2023-09-04 NOTE — Progress Notes (Signed)
 PROGRESS NOTE    Carlos Wise  UJW:[id number] DOB: 05-26-1941 DOA: 08/31/2023 PCP: Ailene Ravel, MD    Brief Narrative:   83 year old with history of alcohol abuse, seizure disorder, CVA, SDH, anxiety, chronic hyponatremia admitted for confusion and generalized weakness.  Reporting brain fog on the day of admission.  Upon admission CT of the head negative, noted to have hyponatremia with sodium of 121.  After reviewing urine studies and osmolality there is suspicion for SIADH therefore placed on fluid restriction.  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  Currently 123. 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  Urine sodium 142, urine osmolality 450, serum osmolality 252.  TSH is normal.  No obvious neurodeficits at this time.  Continue fluid restriction.  If necessary we can consult nephrology Monitor BUN, ptn is currently on Urea.  Sodium gradually improving up to 129 which seems to be his chronic levels, if stays stable likely discharge in the next 1 to 2 days.  Counseled on fluid restriction apparently he drinks plenty of fluids.     Acute encephalopathy  Appears to have resolved.  CT head negative.   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  Patient self caths at home.  Seen by urology.  Declining any other intervention at this time.  PT/OT   DVT prophylaxis: Lovenox    Code Status: Full Code Family Communication: HCPOA updated Status is: Inpatient Remains inpatient appropriate because: Continue hospital stay for hyponatremia management.  Hopefully discharge in next 24-48 hours  depending on sodium levels.    Subjective:   Patient in bed, appears comfortable, denies any headache, no fever, no chest pain or pressure, no shortness of breath , no abdominal pain. No new focal weakness.    Examination:  Awake Alert, No new F.N deficits, Normal affect [place].AT,PERRAL Supple Neck, No JVD,   Symmetrical Chest wall movement, Good air movement bilaterally, CTAB RRR,No Gallops, Rubs or new Murmurs,  +ve B.Sounds, Abd Soft, No tenderness,   No Cyanosis, Clubbing or edema     Diet Orders (From admission, onward)     Start     Ordered   09/02/23 0807  Diet regular Room service appropriate? Yes; Fluid consistency: Thin; Fluid restriction: 1200 mL Fluid  Diet effective now       Question Answer Comment  Room service appropriate? Yes   Fluid consistency: Thin   Fluid restriction: 1200 mL Fluid      09/02/23 0807            Objective: Vitals:   09/03/23 1600 09/03/23 2013 09/03/23 2342 09/04/23 0343  BP: 91/76 116/70 105/71   Pulse: 81 90 72 65  Resp: 20 19 17 17   Temp: 97.8 F (36.6 C) 98 F (36.7 C) 97.8 F (36.6 C)   TempSrc: Oral Oral Oral   SpO2: 97% 98% 98% 97%  Weight:    97.2 kg  Height:        Intake/Output Summary (Last 24 hours) at 09/04/2023 1157 Last data filed at 09/04/2023 0344 Gross per 24 hour  Intake 200 ml  Output 1150 ml  Net -950 ml  Filed Weights   09/01/23 1930 09/02/23 0500 09/04/23 0343  Weight: 70.7 kg 70.2 kg 97.2 kg    Scheduled Meds:  atorvastatin  5 mg Oral QHS   busPIRone  15 mg Oral QHS   busPIRone  30 mg Oral Daily   clopidogrel  75 mg Oral Daily   enoxaparin (LOVENOX) injection  40 mg Subcutaneous Q24H   folic acid  1 mg Oral Daily   gabapentin  600 mg Oral QHS   lamoTRIgine  150 mg Oral BID   LORazepam  0-4 mg Intravenous Q8H   melatonin  3 mg Oral QHS   multivitamin with minerals  1 tablet Oral Daily   pantoprazole  40 mg Oral Daily   sodium chloride flush  3 mL Intravenous Q12H   sodium chloride  1  g Oral TID   tamsulosin  0.4 mg Oral QPC breakfast   thiamine  100 mg Oral Daily   Continuous Infusions:  Nutritional status     Body mass index is 31.64 kg/m.  Data Reviewed:   CBC: Recent Labs  Lab 08/31/23 1233 09/01/23 0829 09/02/23 0433 09/03/23 0344 09/04/23 0738  WBC 8.6 9.6 9.5 8.8 6.5  NEUTROABS 6.3  --   --   --   --   HGB 13.3 13.0 12.2* 11.5* 11.8*  HCT 37.2* 36.3* 33.2* 32.1* 32.7*  MCV 88.2 88.5 87.6 88.4 89.1  PLT 269 256 254 241 245   Basic Metabolic Panel: Recent Labs  Lab 09/02/23 2043 09/03/23 0344 09/03/23 1249 09/03/23 2023 09/04/23 0738  NA 123* 123* 127* 126* 128*  K 3.8 3.8 4.0 4.1 4.3  CL 91* 90* 93* 93* 95*  CO2 21* 22 26 22 25   GLUCOSE 137* 95 115* 127* 96  BUN 32* 40* 44* 33* 21  CREATININE 1.09 1.01 1.04 1.08 0.96  CALCIUM 9.0 9.0 9.1 8.9 9.1  MG  --  2.0  --   --  2.1   GFR: Estimated Creatinine Clearance: 69.4 mL/min (by C-G formula based on SCr of 0.96 mg/dL). Liver Function Tests: Recent Labs  Lab 08/31/23 1233  AST 24  ALT 16  ALKPHOS 51  BILITOT 1.3*  PROT 7.7  ALBUMIN 4.5   No results for input(s): "LIPASE", "AMYLASE" in the last 168 hours. Recent Labs  Lab 08/31/23 1233  AMMONIA 18   Coagulation Profile: No results for input(s): "INR", "PROTIME" in the last 168 hours. Cardiac Enzymes: No results for input(s): "CKTOTAL", "CKMB", "CKMBINDEX", "TROPONINI" in the last 168 hours. BNP (last 3 results) No results for input(s): "PROBNP" in the last 8760 hours. HbA1C: No results for input(s): "HGBA1C" in the last 72 hours. CBG: No results for input(s): "GLUCAP" in the last 168 hours. Lipid Profile: No results for input(s): "CHOL", "HDL", "LDLCALC", "TRIG", "CHOLHDL", "LDLDIRECT" in the last 72 hours. Thyroid Function Tests: No results for input(s): "TSH", "T4TOTAL", "FREET4", "T3FREE", "THYROIDAB" in the last 72 hours.  Anemia Panel: No results for input(s): "VITAMINB12", "FOLATE", "FERRITIN", "TIBC",  "IRON", "RETICCTPCT" in the last 72 hours. Sepsis Labs: No results for input(s): "PROCALCITON", "LATICACIDVEN" in the last 168 hours.  Recent Results (from the past 240 hours)  Resp panel by RT-PCR (RSV, Flu A&B, Covid) Anterior Nasal Swab     Status: None   Collection Time: 08/31/23 11:58 AM   Specimen: Anterior Nasal Swab  Result Value Ref Range Status   SARS Coronavirus 2 by RT PCR NEGATIVE NEGATIVE Final   Influenza A by PCR NEGATIVE NEGATIVE Final  Influenza B by PCR NEGATIVE NEGATIVE Final    Comment: (NOTE) The Xpert Xpress SARS-CoV-2/FLU/RSV plus assay is intended as an aid in the diagnosis of influenza from Nasopharyngeal swab specimens and should not be used as a sole basis for treatment. Nasal washings and aspirates are unacceptable for Xpert Xpress SARS-CoV-2/FLU/RSV testing.  Fact Sheet for Patients: BloggerCourse.com  Fact Sheet for Healthcare Providers: SeriousBroker.it  This test is not yet approved or cleared by the [place] FDA and has been authorized for detection and/or diagnosis of SARS-CoV-2 by FDA under an Emergency Use Authorization (EUA). This EUA will remain in effect (meaning this test can be used) for the duration of the COVID-19 declaration under Section 564(b)(1) of the Act, 21 U.S.C. section 360bbb-3(b)(1), unless the authorization is terminated or revoked.     Resp Syncytial Virus by PCR NEGATIVE NEGATIVE Final    Comment: (NOTE) Fact Sheet for Patients: BloggerCourse.com  Fact Sheet for Healthcare Providers: SeriousBroker.it  This test is not yet approved or cleared by the [place] FDA and has been authorized for detection and/or diagnosis of SARS-CoV-2 by FDA under an Emergency Use Authorization (EUA). This EUA will remain in effect (meaning this test can be used) for the duration of the COVID-19 declaration under Section  564(b)(1) of the Act, 21 U.S.C. section 360bbb-3(b)(1), unless the authorization is terminated or revoked.  Performed at [facility], [address] [place], [place] [address]          Radiology Studies: No results found.         LOS: 4 days   Time spent= 35 mins    Susa Raring, MD [facility]  If 7PM-7AM, please contact night-coverage  09/04/2023, 11:57 AM

## 2023-09-05 DIAGNOSIS — E871 Hypo-osmolality and hyponatremia: Secondary | ICD-10-CM | POA: Diagnosis not present

## 2023-09-05 LAB — CBC WITH DIFFERENTIAL/PLATELET
Abs Immature Granulocytes: 0.05 10*3/uL (ref 0.00–0.07)
Basophils Absolute: 0.1 10*3/uL (ref 0.0–0.1)
Basophils Relative: 1 %
Eosinophils Absolute: 0.5 10*3/uL (ref 0.0–0.5)
Eosinophils Relative: 6 %
HCT: 33.6 % — ABNORMAL LOW (ref 39.0–52.0)
Hemoglobin: 12 g/dL — ABNORMAL LOW (ref 13.0–17.0)
Immature Granulocytes: 1 %
Lymphocytes Relative: 21 %
Lymphs Abs: 1.6 10*3/uL (ref 0.7–4.0)
MCH: 31.8 pg (ref 26.0–34.0)
MCHC: 35.7 g/dL (ref 30.0–36.0)
MCV: 89.1 fL (ref 80.0–100.0)
Monocytes Absolute: 0.8 10*3/uL (ref 0.1–1.0)
Monocytes Relative: 11 %
Neutro Abs: 4.4 10*3/uL (ref 1.7–7.7)
Neutrophils Relative %: 60 %
Platelets: 246 10*3/uL (ref 150–400)
RBC: 3.77 MIL/uL — ABNORMAL LOW (ref 4.22–5.81)
RDW: 12.7 % (ref 11.5–15.5)
WBC: 7.4 10*3/uL (ref 4.0–10.5)
nRBC: 0 % (ref 0.0–0.2)

## 2023-09-05 LAB — BRAIN NATRIURETIC PEPTIDE: B Natriuretic Peptide: 32.9 pg/mL (ref 0.0–100.0)

## 2023-09-05 LAB — BASIC METABOLIC PANEL
Anion gap: 8 (ref 5–15)
BUN: 12 mg/dL (ref 8–23)
CO2: 24 mmol/L (ref 22–32)
Calcium: 8.9 mg/dL (ref 8.9–10.3)
Chloride: 99 mmol/L (ref 98–111)
Creatinine, Ser: 0.82 mg/dL (ref 0.61–1.24)
GFR, Estimated: >60 mL/min (ref >60)
Glucose, Bld: 94 mg/dL (ref 70–99)
Potassium: 4 mmol/L (ref 3.5–5.1)
Sodium: 131 mmol/L — ABNORMAL LOW (ref 135–145)

## 2023-09-05 LAB — MAGNESIUM: Magnesium: 2 mg/dL (ref 1.7–2.4)

## 2023-09-05 MED ORDER — FUROSEMIDE 10 MG/ML IJ SOLN: 20.0000 mg | Freq: Once | INTRAMUSCULAR | Status: DC

## 2023-09-05 NOTE — Plan of Care (Signed)

## 2023-09-05 NOTE — Discharge Summary (Addendum)
 Carlos Wise VWU:[id number] DOB: 21-Jul-1940 DOA: 08/31/2023  PCP: Ailene Ravel, MD  Admit date: 08/31/2023  Discharge date: 09/05/2023  Admitted From: Home   Disposition:  Home - refused SNF   Recommendations for Outpatient Follow-up:   Follow up with PCP in 1-2 weeks  PCP Please obtain BMP/CBC, 2 view CXR in 1week,  (see Discharge instructions)   PCP Please follow up on the following pending results: Monitor fluid restriction of 1200 cc/day, monitor EMB closely, needs outpatient urology follow-up in 1 to 2 weeks.   Home Health: PT, RN   Equipment/Devices: as below  Consultations: None  Discharge Condition: Stable    CODE STATUS: Full    Diet Recommendation: Heart Healthy with strict 1200 cc total fluid restriction per day     Chief Complaint  Patient presents with   Nasal Congestion   Altered Mental Status     Brief history of present illness from the day of admission and additional interim summary    83 year old with history of alcohol abuse, seizure disorder, CVA, SDH, anxiety, chronic hyponatremia admitted for confusion and generalized weakness. Reporting brain fog on the day of admission. Upon admission CT of the head negative, noted to have hyponatremia with sodium of 121. After reviewing urine studies and osmolality there is suspicion for SIADH therefore placed on fluid restriction.                                                                  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  Currently 123. 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  Urine sodium 142, urine osmolality 450, serum osmolality 252.  TSH is normal.  No obvious neurodeficits at this time.  Continue fluid restriction.  If necessary we can consult nephrology With strict  fluid restriction and gentle diuresis sodium levels improving.  Mentation back to baseline, eager to go home, refused SNF placement, home PT OT and walker.  Strictly counseled on fluid restriction.  He says he feels better than he feels at home today, completely symptom-free and eager to go home.     Acute encephalopathy  Appears to have resolved.  CT head negative.   Hx of CVA  -Continue Plavix, statin   Seizure disorder  -Lamictal   Alcohol abuse   -He claims he drinks only 1 shot of whiskey at night, no signs of DTs, counseled to avoid excessive use of alcohol.  Continue home supplementation of thiamine and folic acid.   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  Patient self caths at home.  Seen by urology.  Declining any other intervention at this time.  PCP to monitor and  arrange for outpatient urology follow-up postdischarge     Discharge diagnosis     Principal Problem:   Acute on chronic hyponatremia Active Problems:   Alcohol use   HTN (hypertension)   HLD (hyperlipidemia)   Seizure disorder (HCC)   Encephalopathy acute   History of ischemic stroke    Discharge instructions    Discharge Instructions     Discharge instructions   Complete by: As directed    Follow with Primary MD Hamrick, Maura L, MD in 3 days   Get CBC, CMP, 2 view Chest X ray -  checked next visit with your primary MD    Activity: As tolerated with Full fall precautions use walker/cane & assistance as needed  Disposition Home   Diet: Heart Healthy - strict total 1200 cc fluid restriction per day  Special Instructions: If you have smoked or chewed Tobacco  in the last 2 yrs please stop smoking, stop any regular Alcohol  and or any Recreational drug use.  On your next visit with your primary care physician please Get Medicines reviewed and adjusted.  Please request your  Prim.MD to go over all Hospital Tests and Procedure/Radiological results at the follow up, please get all Hospital records sent to your Prim MD by signing hospital release before you go home.  If you experience worsening of your admission symptoms, develop shortness of breath, life threatening emergency, suicidal or homicidal thoughts you must seek medical attention immediately by calling 911 or calling your MD immediately  if symptoms less severe.  You Must read complete instructions/literature along with all the possible adverse reactions/side effects for all the Medicines you take and that have been prescribed to you. Take any new Medicines after you have completely understood and accpet all the possible adverse reactions/side effects.   Do not drive when taking Pain medications.  Do not take more than prescribed Pain, Sleep and Anxiety Medications  Wear Seat belts while driving.   Increase activity slowly   Complete by: As directed        Discharge Medications   Allergies as of 09/05/2023       Reactions   Oxcarbazepine    Significant and severe hyponatremia noted within a few days of starting Oxcarbazepine.   Penicillins Other (See Comments)   Patient states no allergy, but has preference that he does not want this medication        Medication List     TAKE these medications    Allergy Eye Drops 0.025 % ophthalmic solution Generic drug: ketotifen Place 1 drop into both eyes daily as needed (red eyes).   atorvastatin 10 MG tablet Commonly known as: LIPITOR Take 5 mg by mouth at bedtime.   busPIRone 15 MG tablet Commonly known as: BUSPAR Take 15 mg by mouth See admin instructions. 2 tab in am , 1 tab at pm   clobetasol cream 0.05 % Commonly known as: TEMOVATE Apply 1 Application topically daily as needed (itching scalp).   clopidogrel 75 MG tablet Commonly known as: PLAVIX TAKE 1 TABLET BY MOUTH EVERY DAY   fluticasone 50 MCG/ACT nasal spray Commonly known as:  FLONASE Place 1 spray into both nostrils daily as needed for allergies.   folic acid 1 MG tablet Commonly known as: FOLVITE Take 1 mg by mouth daily.   gabapentin 300 MG capsule Commonly known as: NEURONTIN Take 600 mg by mouth at bedtime.   lamoTRIgine 150 MG tablet Commonly known as: LaMICtal Take 1 tablet (150 mg total)  by mouth 2 (two) times daily.   melatonin 1 MG Tabs tablet Take 1 mg by mouth at bedtime.   pantoprazole 40 MG tablet Commonly known as: PROTONIX Take 40 mg by mouth daily.   sodium chloride 0.65 % Soln nasal spray Commonly known as: OCEAN Place 1 spray into both nostrils daily as needed for congestion.   sodium chloride 1 g tablet Take 1 g by mouth 3 (three) times daily.   tamsulosin 0.4 MG Caps capsule Commonly known as: FLOMAX Take 0.4 mg by mouth.   thiamine 100 MG tablet Commonly known as: VITAMIN B1 Take 250 mg by mouth daily.   triamcinolone cream 0.1 % Commonly known as: KENALOG Apply 1 Application topically daily as needed (itching skin).   VITAMIN B-12 PO Take 1 tablet by mouth daily.   VITAMIN D3 PO Take 1 capsule by mouth daily.               Durable Medical Equipment  (From admission, onward)           Start     Ordered   09/05/23 1013  For home use only DME Walker rolling  Once       Comments: 5 wheel  Question Answer Comment  Walker: With 5 Inch Wheels   Patient needs a walker to treat with the following condition Weakness      09/05/23 1012             Follow-up Information     Hamrick, Maura L, MD. Schedule an appointment as soon as possible for a visit in 3 day(s).   Specialty: Family Medicine Contact information: [address] [address] [phone]                 Major procedures and Radiology Reports - PLEASE review detailed and final reports thoroughly  -     CT Head Wo Contrast Result Date: 08/31/2023 CLINICAL DATA:  Initial evaluation for acute mental status  change, unknown cause. EXAM: CT HEAD WITHOUT CONTRAST TECHNIQUE: Contiguous axial images were obtained from the base of the skull through the vertex without intravenous contrast. RADIATION DOSE REDUCTION: This exam was performed according to the departmental dose-optimization program which includes automated exposure control, adjustment of the mA and/or kV according to patient size and/or use of iterative reconstruction technique. COMPARISON:  Prior study from 01/03/2023 FINDINGS: Brain: Age-related cerebral atrophy. Patchy and confluent hypodensity involving the supratentorial cerebral white matter, consistent with chronic small vessel ischemic disease, moderate to advanced in nature. No acute intracranial hemorrhage. No acute large vessel territory infarct. No mass lesion or midline shift. Ventricular prominence related global parenchymal volume loss without hydrocephalus. No extra-axial fluid collection. Vascular: No abnormal hyperdense vessel. Calcified atherosclerosis present at skull base. Post treatment changes related to prior cavernous right ICA aneurysm treatment noted. Skull: Scalp soft tissues within normal limits.  Calvarium intact. Sinuses/Orbits: Globes orbital soft tissues within normal limits. Paranasal sinuses are largely clear. No significant mastoid effusion. Other: None. IMPRESSION: 1. No acute intracranial abnormality. 2. 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 3. 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 Electronically Signed   By: Rise Mu M.D.   On: 08/31/2023 19:37   DG Chest 2 View Result Date: 08/31/2023 CLINICAL DATA:  Altered mental status EXAM: CHEST - 2 VIEW COMPARISON:  None Available. FINDINGS: Normal cardiac silhouette. No effusion, infiltrate or pneumothorax. Multiple posterior LEFT healed rib fractures.  IMPRESSION: No acute cardiopulmonary process. Electronically Signed   By:  Genevive Bi M.D.   On: 08/31/2023 14:32    Micro Results    Recent Results (from the past 240 hours)  Resp panel by RT-PCR (RSV, Flu A&B, Covid) Anterior Nasal Swab     Status: None   Collection Time: 08/31/23 11:58 AM   Specimen: Anterior Nasal Swab  Result Value Ref Range Status   SARS Coronavirus 2 by RT PCR NEGATIVE NEGATIVE Final   Influenza A by PCR NEGATIVE NEGATIVE Final   Influenza B by PCR NEGATIVE NEGATIVE Final    Comment: (NOTE) The Xpert Xpress SARS-CoV-2/FLU/RSV plus assay is intended as an aid in the diagnosis of influenza from Nasopharyngeal swab specimens and should not be used as a sole basis for treatment. Nasal washings and aspirates are unacceptable for Xpert Xpress SARS-CoV-2/FLU/RSV testing.  Fact Sheet for Patients: BloggerCourse.com  Fact Sheet for Healthcare Providers: SeriousBroker.it  This test is not yet approved or cleared by the [place] FDA and has been authorized for detection and/or diagnosis of SARS-CoV-2 by FDA under an Emergency Use Authorization (EUA). This EUA will remain in effect (meaning this test can be used) for the duration of the COVID-19 declaration under Section 564(b)(1) of the Act, 21 U.S.C. section 360bbb-3(b)(1), unless the authorization is terminated or revoked.     Resp Syncytial Virus by PCR NEGATIVE NEGATIVE Final    Comment: (NOTE) Fact Sheet for Patients: BloggerCourse.com  Fact Sheet for Healthcare Providers: SeriousBroker.it  This test is not yet approved or cleared by the [place] FDA and has been authorized for detection and/or diagnosis of SARS-CoV-2 by FDA under an Emergency Use Authorization (EUA). This EUA will remain in effect (meaning this test can be used) for the duration of the COVID-19 declaration under Section 564(b)(1) of the Act, 21 U.S.C. section 360bbb-3(b)(1), unless the  authorization is terminated or revoked.  Performed at [facility], [address], [place], [place] [address]     Today   Subjective    Carlos Wise today has no headache,no chest abdominal pain,no new weakness tingling or numbness, feels much better wants to go home today.    Objective   Blood pressure (!) 127/93, pulse 64, temperature 97.8 F (36.6 C), temperature source Oral, resp. rate (!) 21, height 5\' 9"  (1.753 m), weight 97.6 kg, SpO2 96%.   Intake/Output Summary (Last 24 hours) at 09/05/2023 1016 Last data filed at 09/05/2023 0400 Gross per 24 hour  Intake 243 ml  Output 700 ml  Net -457 ml    Exam  Awake Alert, No new F.N deficits,    [place].AT,PERRAL Supple Neck,   Symmetrical Chest wall movement, Good air movement bilaterally, CTAB RRR,No Gallops,   +ve B.Sounds, Abd Soft, Non tender,  No Cyanosis, Clubbing or edema    Data Review   Recent Labs  Lab 08/31/23 1233 09/01/23 0829 09/02/23 0433 09/03/23 0344 09/04/23 0738 09/05/23 0534  WBC 8.6 9.6 9.5 8.8 6.5 7.4  HGB 13.3 13.0 12.2* 11.5* 11.8* 12.0*  HCT 37.2* 36.3* 33.2* 32.1* 32.7* 33.6*  PLT 269 256 254 241 245 246  MCV 88.2 88.5 87.6 88.4 89.1 89.1  MCH 31.5 31.7 32.2 31.7 32.2 31.8  MCHC 35.8 35.8 36.7* 35.8 36.1* 35.7  RDW 12.3 12.2 12.2 12.4 12.4 12.7  LYMPHSABS 1.3  --   --   --   --  1.6  MONOABS 0.7  --   --   --   --  0.8  EOSABS 0.1  --   --   --   --  0.5  BASOSABS 0.1  --   --   --   --  0.1    Recent Labs  Lab 08/31/23 1233 09/01/23 0053 09/03/23 0344 09/03/23 1249 09/03/23 2023 09/04/23 0738 09/05/23 0534  NA 121*   < > 123* 127* 126* 128* 131*  K 4.4   < > 3.8 4.0 4.1 4.3 4.0  CL 90*   < > 90* 93* 93* 95* 99  CO2 22   < > 22 26 22 25 24   ANIONGAP 9   < > 11 8 11 8 8   GLUCOSE 111*   < > 95 115* 127* 96 94  BUN 8   < > 40* 44* 33* 21 12  CREATININE 0.75   < > 1.01 1.04 1.08 0.96 0.82  AST 24  --   --   --   --   --   --   ALT 16  --   --   --   --   --   --    ALKPHOS 51  --   --   --   --   --   --   BILITOT 1.3*  --   --   --   --   --   --   ALBUMIN 4.5  --   --   --   --   --   --   TSH 1.656  --   --   --   --   --   --   AMMONIA 18  --   --   --   --   --   --   BNP  --   --   --   --   --  20.8 32.9  MG  --   --  2.0  --   --  2.1 2.0  CALCIUM 9.4   < > 9.0 9.1 8.9 9.1 8.9   < > = values in this interval not displayed.    Total Time in preparing paper work, data evaluation and todays exam - 35 minutes  Signature  -    Susa Raring M.D on 09/05/2023 at 10:16 AM   -  To page go to [email or web address]

## 2023-09-05 NOTE — TOC Transition Note (Signed)
 Transition of Care [facility]) - Discharge Note   Patient Details  Name: Carlos Wise MRN: [id number] Date of Birth: 20-May-1941  [facility] of [facility]) CM/SW Contact:  Lawerance Sabal, RN Phone Number: 09/05/2023, 11:00 AM   Clinical Narrative:     Spoke w patient's [name].  She is agreeable to [facility] services, attempted w [facility] but they are OON, referral made to [facility], preferred provider for insurance and notified Victorino Dike. Patient has all needed DME per Victorino Dike and she will provide ride home.  Home address is [address] [place] [address]  Final next level of care: Home w Home Health Services Barriers to Discharge: No Barriers Identified   Patient Goals and CMS Choice Patient states their goals for this hospitalization and ongoing recovery are:: to go home CMS Medicare.gov Compare Post Acute Care list provided to:: Other (Comment Required) Choice offered to / list presented to : [facility] POA / Guardian      Discharge Placement                       Discharge Plan and Services Additional resources added to the After Visit Summary for                  DME Arranged: N/A         HH Arranged: PT, RN HH Agency: [facility] Date [facility] Agency Contacted: 09/05/23 Time HH Agency Contacted: 1059 Representative spoke with at [facility] Agency: Amy  Social Drivers of Health (SDOH) Interventions SDOH Screenings   Food Insecurity: No Food Insecurity (08/31/2023)  Housing: Low Risk  (08/31/2023)  Transportation Needs: No Transportation Needs (08/31/2023)  Utilities: Not At Risk (08/31/2023)  Alcohol Screen: Low Risk  (05/11/2022)  Social Connections: Socially Isolated (08/31/2023)  Tobacco Use: High Risk (08/31/2023)     Readmission Risk Interventions     No data to display

## 2023-09-05 NOTE — Discharge Instructions (Signed)
 Follow with Primary MD Hamrick, Maura L, MD in 3 days   Get CBC, CMP, 2 view Chest X ray -  checked next visit with your primary MD    Activity: As tolerated with Full fall precautions use walker/cane & assistance as needed  Disposition Home   Diet: Heart Healthy - strict total 1200 cc fluid restriction per day  Special Instructions: If you have smoked or chewed Tobacco  in the last 2 yrs please stop smoking, stop any regular Alcohol  and or any Recreational drug use.  On your next visit with your primary care physician please Get Medicines reviewed and adjusted.  Please request your Prim.MD to go over all Hospital Tests and Procedure/Radiological results at the follow up, please get all Hospital records sent to your Prim MD by signing hospital release before you go home.  If you experience worsening of your admission symptoms, develop shortness of breath, life threatening emergency, suicidal or homicidal thoughts you must seek medical attention immediately by calling 911 or calling your MD immediately  if symptoms less severe.  You Must read complete instructions/literature along with all the possible adverse reactions/side effects for all the Medicines you take and that have been prescribed to you. Take any new Medicines after you have completely understood and accpet all the possible adverse reactions/side effects.   Do not drive when taking Pain medications.  Do not take more than prescribed Pain, Sleep and Anxiety Medications  Wear Seat belts while driving.

## 2023-09-05 NOTE — Progress Notes (Signed)
 Patient refused bladder scan at 2000 and 0000. Took bladder scan at 0400 with a reading of . Total urine output throughout the night is 700 ml.

## 2023-09-05 NOTE — Progress Notes (Signed)
 PROGRESS NOTE    Carlos Wise  ZHY:[id number] DOB: 24-Feb-1941 DOA: 08/31/2023 PCP: Ailene Ravel, MD    Brief Narrative:   83 year old with history of alcohol abuse, seizure disorder, CVA, SDH, anxiety, chronic hyponatremia admitted for confusion and generalized weakness.  Reporting brain fog on the day of admission.  Upon admission CT of the head negative, noted to have hyponatremia with sodium of 121.  After reviewing urine studies and osmolality there is suspicion for SIADH therefore placed on fluid restriction.  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  Currently 123. 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  Urine sodium 142, urine osmolality 450, serum osmolality 252.  TSH is normal.  No obvious neurodeficits at this time.  Continue fluid restriction.  If necessary we can consult nephrology With strict fluid restriction and gentle diuresis sodium levels improving.  If stable likely discharge home on 09/06/2023.   Acute encephalopathy  Appears to have resolved.  CT head negative.   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  Patient self caths at home.  Seen by urology.  Declining any other intervention at this time.  PT/OT   DVT prophylaxis: Lovenox    Code Status: Full Code Family Communication: HCPOA updated Status is: Inpatient Remains inpatient appropriate because: Continue hospital stay for hyponatremia management.  Hopefully discharge in next 24-48 hours depending on sodium levels.    Subjective: Patient in bed, appears comfortable, denies any headache, no fever, no chest pain  or pressure, no shortness of breath , no abdominal pain. No focal weakness.    Examination:  Awake Alert, No new F.N deficits, Normal affect [place].AT,PERRAL Supple Neck, No JVD,   Symmetrical Chest wall movement, Good air movement bilaterally, CTAB RRR,No Gallops, Rubs or new Murmurs,  +ve B.Sounds, Abd Soft, No tenderness,   No Cyanosis, Clubbing or edema     Diet Orders (From admission, onward)     Start     Ordered   09/02/23 0807  Diet regular Room service appropriate? Yes; Fluid consistency: Thin; Fluid restriction: 1200 mL Fluid  Diet effective now       Question Answer Comment  Room service appropriate? Yes   Fluid consistency: Thin   Fluid restriction: 1200 mL Fluid      09/02/23 0807            Objective: Vitals:   09/04/23 2000 09/04/23 2331 09/05/23 0332 09/05/23 0500  BP: (!) 146/69 129/66 (!) 127/93   Pulse: 76 65 64   Resp: 18 19 (!) 21   Temp: 98.4 F (36.9 C) (!) 97.2 F (36.2 C) 97.8 F (36.6 C)   TempSrc: Oral Oral Oral   SpO2: 98% 98% 96%   Weight:    97.6 kg  Height:        Intake/Output Summary (Last 24 hours) at 09/05/2023 1003 Last data filed at 09/05/2023 0400 Gross per 24 hour  Intake 243 ml  Output 700 ml  Net -457 ml   Filed Weights   09/02/23 0500 09/04/23 0343 09/05/23 0500  Weight: 70.2 kg 97.2 kg 97.6 kg    Scheduled Meds:  atorvastatin  5  mg Oral QHS   busPIRone  15 mg Oral QHS   busPIRone  30 mg Oral Daily   clopidogrel  75 mg Oral Daily   enoxaparin (LOVENOX) injection  40 mg Subcutaneous Q24H   folic acid  1 mg Oral Daily   furosemide  20 mg Intravenous Once   gabapentin  600 mg Oral QHS   lamoTRIgine  150 mg Oral BID   melatonin  3 mg Oral QHS   multivitamin with minerals  1 tablet Oral Daily   pantoprazole  40 mg Oral Daily   sodium chloride flush  3 mL Intravenous Q12H   sodium chloride  1 g Oral TID   tamsulosin  0.4 mg Oral QPC breakfast   thiamine  100 mg Oral Daily   Continuous Infusions:  Nutritional  status     Body mass index is 31.77 kg/m.  Data Reviewed:   CBC: Recent Labs  Lab 08/31/23 1233 09/01/23 0829 09/02/23 0433 09/03/23 0344 09/04/23 0738 09/05/23 0534  WBC 8.6 9.6 9.5 8.8 6.5 7.4  NEUTROABS 6.3  --   --   --   --  4.4  HGB 13.3 13.0 12.2* 11.5* 11.8* 12.0*  HCT 37.2* 36.3* 33.2* 32.1* 32.7* 33.6*  MCV 88.2 88.5 87.6 88.4 89.1 89.1  PLT 269 256 254 241 245 246   Basic Metabolic Panel: Recent Labs  Lab 09/03/23 0344 09/03/23 1249 09/03/23 2023 09/04/23 0738 09/05/23 0534  NA 123* 127* 126* 128* 131*  K 3.8 4.0 4.1 4.3 4.0  CL 90* 93* 93* 95* 99  CO2 22 26 22 25 24   GLUCOSE 95 115* 127* 96 94  BUN 40* 44* 33* 21 12  CREATININE 1.01 1.04 1.08 0.96 0.82  CALCIUM 9.0 9.1 8.9 9.1 8.9  MG 2.0  --   --  2.1 2.0   GFR: Estimated Creatinine Clearance: 81.4 mL/min (by C-G formula based on SCr of 0.82 mg/dL). Liver Function Tests: Recent Labs  Lab 08/31/23 1233  AST 24  ALT 16  ALKPHOS 51  BILITOT 1.3*  PROT 7.7  ALBUMIN 4.5   No results for input(s): "LIPASE", "AMYLASE" in the last 168 hours. Recent Labs  Lab 08/31/23 1233  AMMONIA 18   Coagulation Profile: No results for input(s): "INR", "PROTIME" in the last 168 hours. Cardiac Enzymes: No results for input(s): "CKTOTAL", "CKMB", "CKMBINDEX", "TROPONINI" in the last 168 hours. BNP (last 3 results) No results for input(s): "PROBNP" in the last 8760 hours. HbA1C: No results for input(s): "HGBA1C" in the last 72 hours. CBG: No results for input(s): "GLUCAP" in the last 168 hours. Lipid Profile: No results for input(s): "CHOL", "HDL", "LDLCALC", "TRIG", "CHOLHDL", "LDLDIRECT" in the last 72 hours. Thyroid Function Tests: No results for input(s): "TSH", "T4TOTAL", "FREET4", "T3FREE", "THYROIDAB" in the last 72 hours.  Anemia Panel: No results for input(s): "VITAMINB12", "FOLATE", "FERRITIN", "TIBC", "IRON", "RETICCTPCT" in the last 72 hours. Sepsis Labs: No results for input(s):  "PROCALCITON", "LATICACIDVEN" in the last 168 hours.  Recent Results (from the past 240 hours)  Resp panel by RT-PCR (RSV, Flu A&B, Covid) Anterior Nasal Swab     Status: None   Collection Time: 08/31/23 11:58 AM   Specimen: Anterior Nasal Swab  Result Value Ref Range Status   SARS Coronavirus 2 by RT PCR NEGATIVE NEGATIVE Final   Influenza A by PCR NEGATIVE NEGATIVE Final   Influenza B by PCR NEGATIVE NEGATIVE Final    Comment: (NOTE) The Xpert Xpress SARS-CoV-2/FLU/RSV plus assay is intended as  an aid in the diagnosis of influenza from Nasopharyngeal swab specimens and should not be used as a sole basis for treatment. Nasal washings and aspirates are unacceptable for Xpert Xpress SARS-CoV-2/FLU/RSV testing.  Fact Sheet for Patients: BloggerCourse.com  Fact Sheet for Healthcare Providers: SeriousBroker.it  This test is not yet approved or cleared by the [place] FDA and has been authorized for detection and/or diagnosis of SARS-CoV-2 by FDA under an Emergency Use Authorization (EUA). This EUA will remain in effect (meaning this test can be used) for the duration of the COVID-19 declaration under Section 564(b)(1) of the Act, 21 U.S.C. section 360bbb-3(b)(1), unless the authorization is terminated or revoked.     Resp Syncytial Virus by PCR NEGATIVE NEGATIVE Final    Comment: (NOTE) Fact Sheet for Patients: BloggerCourse.com  Fact Sheet for Healthcare Providers: SeriousBroker.it  This test is not yet approved or cleared by the [place] FDA and has been authorized for detection and/or diagnosis of SARS-CoV-2 by FDA under an Emergency Use Authorization (EUA). This EUA will remain in effect (meaning this test can be used) for the duration of the COVID-19 declaration under Section 564(b)(1) of the Act, 21 U.S.C. section 360bbb-3(b)(1), unless the authorization is  terminated or revoked.  Performed at [facility], [address], [place], [place] [address]          Radiology Studies: No results found.  Signature  -    Susa Raring M.D on 09/05/2023 at 10:04 AM   -  To page go to www.amion.com

## 2023-09-05 NOTE — Plan of Care (Signed)

## 2023-09-06 LAB — UREA NITROGEN, URINE: Urea Nitrogen, Ur: 982 mg/dL

## 2023-09-13 DIAGNOSIS — I110 Hypertensive heart disease with heart failure: Secondary | ICD-10-CM | POA: Diagnosis not present

## 2023-09-13 DIAGNOSIS — F32A Depression, unspecified: Secondary | ICD-10-CM | POA: Diagnosis not present

## 2023-09-13 DIAGNOSIS — E440 Moderate protein-calorie malnutrition: Secondary | ICD-10-CM | POA: Diagnosis not present

## 2023-09-13 DIAGNOSIS — G40909 Epilepsy, unspecified, not intractable, without status epilepticus: Secondary | ICD-10-CM | POA: Diagnosis not present

## 2023-09-13 DIAGNOSIS — R1310 Dysphagia, unspecified: Secondary | ICD-10-CM | POA: Diagnosis not present

## 2023-09-13 DIAGNOSIS — E871 Hypo-osmolality and hyponatremia: Secondary | ICD-10-CM | POA: Diagnosis not present

## 2023-09-13 DIAGNOSIS — I5030 Unspecified diastolic (congestive) heart failure: Secondary | ICD-10-CM | POA: Diagnosis not present

## 2023-09-13 DIAGNOSIS — E785 Hyperlipidemia, unspecified: Secondary | ICD-10-CM | POA: Diagnosis not present

## 2023-09-13 DIAGNOSIS — F1010 Alcohol abuse, uncomplicated: Secondary | ICD-10-CM | POA: Diagnosis not present

## 2023-09-13 DIAGNOSIS — F419 Anxiety disorder, unspecified: Secondary | ICD-10-CM | POA: Diagnosis not present

## 2023-09-20 DIAGNOSIS — E871 Hypo-osmolality and hyponatremia: Secondary | ICD-10-CM | POA: Diagnosis not present

## 2023-09-20 DIAGNOSIS — F1020 Alcohol dependence, uncomplicated: Secondary | ICD-10-CM | POA: Diagnosis not present

## 2023-09-20 DIAGNOSIS — G40909 Epilepsy, unspecified, not intractable, without status epilepticus: Secondary | ICD-10-CM | POA: Diagnosis not present

## 2023-09-20 DIAGNOSIS — Z139 Encounter for screening, unspecified: Secondary | ICD-10-CM | POA: Diagnosis not present

## 2023-09-20 DIAGNOSIS — F1027 Alcohol dependence with alcohol-induced persisting dementia: Secondary | ICD-10-CM | POA: Diagnosis not present

## 2023-10-12 ENCOUNTER — Other Ambulatory Visit: Payer: Self-pay | Admitting: Neurology

## 2023-10-13 DIAGNOSIS — F1020 Alcohol dependence, uncomplicated: Secondary | ICD-10-CM | POA: Diagnosis not present

## 2023-10-13 DIAGNOSIS — Z23 Encounter for immunization: Secondary | ICD-10-CM | POA: Diagnosis not present

## 2023-10-13 DIAGNOSIS — F411 Generalized anxiety disorder: Secondary | ICD-10-CM | POA: Diagnosis not present

## 2023-10-13 DIAGNOSIS — N400 Enlarged prostate without lower urinary tract symptoms: Secondary | ICD-10-CM | POA: Diagnosis not present

## 2023-10-13 DIAGNOSIS — E7800 Pure hypercholesterolemia, unspecified: Secondary | ICD-10-CM | POA: Diagnosis not present

## 2023-10-13 DIAGNOSIS — E871 Hypo-osmolality and hyponatremia: Secondary | ICD-10-CM | POA: Diagnosis not present

## 2023-10-13 DIAGNOSIS — Z8546 Personal history of malignant neoplasm of prostate: Secondary | ICD-10-CM | POA: Diagnosis not present

## 2023-10-13 DIAGNOSIS — F410 Panic disorder [episodic paroxysmal anxiety] without agoraphobia: Secondary | ICD-10-CM | POA: Diagnosis not present

## 2023-10-13 DIAGNOSIS — F1027 Alcohol dependence with alcohol-induced persisting dementia: Secondary | ICD-10-CM | POA: Diagnosis not present

## 2023-10-13 DIAGNOSIS — Z8673 Personal history of transient ischemic attack (TIA), and cerebral infarction without residual deficits: Secondary | ICD-10-CM | POA: Diagnosis not present

## 2023-10-13 DIAGNOSIS — G629 Polyneuropathy, unspecified: Secondary | ICD-10-CM | POA: Diagnosis not present

## 2023-10-13 DIAGNOSIS — Z79899 Other long term (current) drug therapy: Secondary | ICD-10-CM | POA: Diagnosis not present

## 2023-10-13 DIAGNOSIS — G40909 Epilepsy, unspecified, not intractable, without status epilepticus: Secondary | ICD-10-CM | POA: Diagnosis not present

## 2023-10-19 ENCOUNTER — Telehealth: Payer: Self-pay | Admitting: Neurology

## 2023-10-19 NOTE — Telephone Encounter (Signed)
 Appointment r/s POA is sick w/ Covid-19

## 2023-10-21 ENCOUNTER — Ambulatory Visit: Payer: Medicare HMO | Admitting: Neurology

## 2023-11-06 ENCOUNTER — Other Ambulatory Visit: Payer: Self-pay | Admitting: Neurology

## 2023-11-12 DIAGNOSIS — E871 Hypo-osmolality and hyponatremia: Secondary | ICD-10-CM | POA: Diagnosis not present

## 2023-12-03 ENCOUNTER — Other Ambulatory Visit: Payer: Self-pay

## 2023-12-03 ENCOUNTER — Encounter: Payer: Self-pay | Admitting: Intensive Care

## 2023-12-03 ENCOUNTER — Emergency Department
Admission: EM | Admit: 2023-12-03 | Discharge: 2023-12-03 | Disposition: A | Discharge status: Home / Self Care | Attending: Emergency Medicine | Admitting: Emergency Medicine

## 2023-12-03 DIAGNOSIS — R42 Dizziness and giddiness: Secondary | ICD-10-CM | POA: Insufficient documentation

## 2023-12-03 DIAGNOSIS — R531 Weakness: Secondary | ICD-10-CM | POA: Diagnosis not present

## 2023-12-03 DIAGNOSIS — N390 Urinary tract infection, site not specified: Secondary | ICD-10-CM | POA: Insufficient documentation

## 2023-12-03 DIAGNOSIS — I10 Essential (primary) hypertension: Secondary | ICD-10-CM | POA: Diagnosis not present

## 2023-12-03 LAB — URINALYSIS, ROUTINE W REFLEX MICROSCOPIC
Bilirubin Urine: NEGATIVE
Glucose, UA: NEGATIVE mg/dL
Hgb urine dipstick: NEGATIVE
Ketones, ur: NEGATIVE mg/dL
Nitrite: NEGATIVE
Protein, ur: NEGATIVE mg/dL
Specific Gravity, Urine: 1.009 (ref 1.005–1.030)
pH: 9 — ABNORMAL HIGH (ref 5.0–8.0)

## 2023-12-03 LAB — COMPREHENSIVE METABOLIC PANEL WITH GFR
ALT: 17 U/L (ref 0–44)
AST: 28 U/L (ref 15–41)
Albumin: 4.3 g/dL (ref 3.5–5.0)
Alkaline Phosphatase: 57 U/L (ref 38–126)
Anion gap: 8 (ref 5–15)
BUN: 11 mg/dL (ref 8–23)
CO2: 27 mmol/L (ref 22–32)
Calcium: 9.3 mg/dL (ref 8.9–10.3)
Chloride: 99 mmol/L (ref 98–111)
Creatinine, Ser: 0.85 mg/dL (ref 0.61–1.24)
GFR, Estimated: >60 mL/min (ref >60)
Glucose, Bld: 102 mg/dL — ABNORMAL HIGH (ref 70–99)
Potassium: 4 mmol/L (ref 3.5–5.1)
Sodium: 134 mmol/L — ABNORMAL LOW (ref 135–145)
Total Bilirubin: 1 mg/dL (ref 0.0–1.2)
Total Protein: 7.5 g/dL (ref 6.5–8.1)

## 2023-12-03 LAB — CBC
HCT: 36 % — ABNORMAL LOW (ref 39.0–52.0)
Hemoglobin: 12.7 g/dL — ABNORMAL LOW (ref 13.0–17.0)
MCH: 31.9 pg (ref 26.0–34.0)
MCHC: 35.3 g/dL (ref 30.0–36.0)
MCV: 90.5 fL (ref 80.0–100.0)
Platelets: 246 10*3/uL (ref 150–400)
RBC: 3.98 MIL/uL — ABNORMAL LOW (ref 4.22–5.81)
RDW: 12.1 % (ref 11.5–15.5)
WBC: 8.8 10*3/uL (ref 4.0–10.5)
nRBC: 0 % (ref 0.0–0.2)

## 2023-12-03 LAB — TROPONIN I (HIGH SENSITIVITY): Troponin I (High Sensitivity): 6 ng/L (ref <18)

## 2023-12-03 MED ORDER — CEPHALEXIN 500 MG PO CAPS
500.0000 mg | ORAL_CAPSULE | Freq: Once | ORAL | Status: AC
  Administered 2023-12-03: 500 mg via ORAL
  Filled 2023-12-03: qty 1

## 2023-12-03 MED ORDER — CEPHALEXIN 500 MG PO CAPS: 500.0000 mg | ORAL_CAPSULE | Freq: Two times a day (BID) | ORAL | 0 refills | Status: AC

## 2023-12-03 NOTE — ED Notes (Signed)
 Pt up to toilet

## 2023-12-03 NOTE — Discharge Instructions (Signed)
 Take the antibiotic as prescribed and finish the full course.  Return to the ER for new, worsening, or persistent severe weakness, dizziness, vomiting, fever, or any other new or worsening symptoms that concern you.

## 2023-12-03 NOTE — ED Triage Notes (Signed)
 Arrived by [facility] from home. Caregiver stops in to check on patient. C/o generalized weakness X3 days. Reports decreased appetite. History low sodium when feeling weak  EMS vitals: 168/106 b/p 90HR 98% RA 110CBG

## 2023-12-03 NOTE — ED Notes (Signed)
 Pt given supper tray. Family left room and pt ready to go once family returns

## 2023-12-03 NOTE — ED Provider Notes (Signed)
 [facility] Provider Note    Event Date/Time   First MD Initiated Contact with Patient 12/03/23 1643     (approximate)   History   Weakness   HPI  Carlos Wise is a 83 y.o. male with history of alcohol  abuse, seizure disorder, CVA, SDH, anxiety, and chronic hyponatremia who presents with generalized weakness for the last 2 days, described as feeling somewhat disoriented.  He also has been feeling a bit lightheaded.  He denies any chest pain or difficulty breathing.  He has no vomiting or diarrhea.  He does report urinary frequency.  I reviewed the past medical records.  The patient was most recent admitted to the hospitalist service in March with acute on chronic hyponatremia due to SIADH.  His sodium was 121 on admission.   Physical Exam   Triage Vital Signs: ED Triage Vitals  Encounter Vitals Group     BP 12/03/23 1545 (!) 141/97     Girls Systolic BP Percentile --      Girls Diastolic BP Percentile --      Boys Systolic BP Percentile --      Boys Diastolic BP Percentile --      Pulse Rate 12/03/23 1545 89     Resp 12/03/23 1545 16     Temp 12/03/23 1545 99.3 F (37.4 C)     Temp Source 12/03/23 1545 Oral     SpO2 12/03/23 1545 96 %     Weight 12/03/23 1546 165 lb (74.8 kg)     Height 12/03/23 1546 5' 8 (1.727 m)     Head Circumference --      Peak Flow --      Pain Score 12/03/23 1546 0     Pain Loc --      Pain Education --      Exclude from Growth Chart --     Most recent vital signs: Vitals:   12/03/23 1800 12/03/23 1828  BP: (!) 173/94   Pulse: 87 91  Resp: 15 20  Temp:    SpO2: 97% 98%     General: Alert and oriented, no distress.  CV:  Good peripheral perfusion.  Resp:  Normal effort.  Lungs CTAB. Abd:  No distention.  Other:  No peripheral edema.  Moist mucous membranes.  Motor intact in all extremities.   ED Results / Procedures / Treatments   Labs (all labs ordered are listed, but only abnormal results are  displayed) Labs Reviewed  COMPREHENSIVE METABOLIC PANEL WITH GFR - Abnormal; Notable for the following components:      Result Value   Sodium 134 (*)    Glucose, Bld 102 (*)    All other components within normal limits  CBC - Abnormal; Notable for the following components:   RBC 3.98 (*)    Hemoglobin 12.7 (*)    HCT 36.0 (*)    All other components within normal limits  URINALYSIS, ROUTINE W REFLEX MICROSCOPIC - Abnormal; Notable for the following components:   Color, Urine STRAW (*)    APPearance CLEAR (*)    pH 9.0 (*)    Leukocytes,Ua SMALL (*)    Bacteria, UA RARE (*)    All other components within normal limits  CBG MONITORING, ED  TROPONIN I (HIGH SENSITIVITY)     EKG  ED ECG REPORT I, Lind Repine, the attending physician, personally viewed and interpreted this ECG.  Date: 12/03/2023 EKG Time: 1547 Rate: 87 Rhythm: normal sinus rhythm QRS Axis:  normal Intervals: normal ST/T Wave abnormalities: Nonspecific T wave abnormalities Narrative Interpretation: no evidence of acute ischemia    RADIOLOGY    PROCEDURES:  Critical Care performed: No  Procedures   MEDICATIONS ORDERED IN ED: Medications  cephALEXin  (KEFLEX ) capsule 500 mg (500 mg Oral Given 12/03/23 1833)     IMPRESSION / MDM / ASSESSMENT AND PLAN / ED COURSE  I reviewed the triage vital signs and the nursing notes.  83 year old male with PMH as noted above presents with generalized weakness over the last couple of days associated with some lightheadedness and urinary frequency.  He is concerned that his sodium is low again.  On exam his vital signs are normal except for borderline elevated temperature and BP.  Mucous membranes are moist.  Neurologic exam is nonfocal.  Differential diagnosis includes, but is not limited to, hyponatremia, other electrolyte abnormality, dehydration, other metabolic disturbance, UTI or other infection, less likely cardiac etiology.  CBC shows no significant  leukocytosis or anemia.  CMP shows a normal sodium.  Urinalysis is suggestive of possible UTI.  I have added on a troponin.  Patient's presentation is most consistent with acute presentation with potential threat to life or bodily function.  The patient is on the cardiac monitor to evaluate for evidence of arrhythmia and/or significant heart rate changes.   ----------------------------------------- 6:30 PM on 12/03/2023 -----------------------------------------  Troponin is negative.  I did consider whether this patient may benefit from inpatient admission due to his age and comorbidities.  However given his stable vital signs, reassuring lab workup, and ability to tolerate p.o., he is appropriate for discharge home.  The patient himself would prefer to go home.  He is stable for discharge at this time.  I counseled him and his family member on the results of the workup.  I have prescribed Keflex .  I gave strict return precautions, and he expressed understanding.   FINAL CLINICAL IMPRESSION(S) / ED DIAGNOSES   Final diagnoses:  Weakness  Urinary tract infection without hematuria, site unspecified     Rx / DC Orders   ED Discharge Orders          Ordered    cephALEXin  (KEFLEX ) 500 MG capsule  2 times daily        12/03/23 1830             Note:  This document was prepared using Dragon voice recognition software and may include unintentional dictation errors.    Lind Repine, MD 12/03/23 2324

## 2023-12-09 ENCOUNTER — Other Ambulatory Visit: Payer: Self-pay | Admitting: Neurology

## 2023-12-10 DIAGNOSIS — E871 Hypo-osmolality and hyponatremia: Secondary | ICD-10-CM | POA: Diagnosis not present

## 2023-12-30 DIAGNOSIS — N5201 Erectile dysfunction due to arterial insufficiency: Secondary | ICD-10-CM | POA: Diagnosis not present

## 2023-12-30 DIAGNOSIS — R339 Retention of urine, unspecified: Secondary | ICD-10-CM | POA: Diagnosis not present

## 2023-12-30 DIAGNOSIS — N400 Enlarged prostate without lower urinary tract symptoms: Secondary | ICD-10-CM | POA: Diagnosis not present

## 2024-01-03 DIAGNOSIS — H6123 Impacted cerumen, bilateral: Secondary | ICD-10-CM | POA: Diagnosis not present

## 2024-01-03 DIAGNOSIS — H903 Sensorineural hearing loss, bilateral: Secondary | ICD-10-CM | POA: Diagnosis not present

## 2024-01-12 ENCOUNTER — Telehealth: Payer: Self-pay | Admitting: Neurology

## 2024-01-12 NOTE — Telephone Encounter (Signed)
Request to reschedule appointment

## 2024-01-13 ENCOUNTER — Ambulatory Visit: Admitting: Neurology

## 2024-02-09 ENCOUNTER — Other Ambulatory Visit: Payer: Self-pay | Admitting: Neurology

## 2024-02-27 ENCOUNTER — Other Ambulatory Visit: Payer: Self-pay | Admitting: Neurology

## 2024-03-08 ENCOUNTER — Other Ambulatory Visit: Payer: Self-pay | Admitting: Neurology

## 2024-03-20 DIAGNOSIS — C61 Malignant neoplasm of prostate: Secondary | ICD-10-CM | POA: Diagnosis not present

## 2024-04-09 ENCOUNTER — Other Ambulatory Visit: Payer: Self-pay | Admitting: Neurology

## 2024-04-14 DIAGNOSIS — F411 Generalized anxiety disorder: Secondary | ICD-10-CM | POA: Diagnosis not present

## 2024-04-14 DIAGNOSIS — F410 Panic disorder [episodic paroxysmal anxiety] without agoraphobia: Secondary | ICD-10-CM | POA: Diagnosis not present

## 2024-04-14 DIAGNOSIS — Z79899 Other long term (current) drug therapy: Secondary | ICD-10-CM | POA: Diagnosis not present

## 2024-04-14 DIAGNOSIS — Z8673 Personal history of transient ischemic attack (TIA), and cerebral infarction without residual deficits: Secondary | ICD-10-CM | POA: Diagnosis not present

## 2024-04-14 DIAGNOSIS — F1020 Alcohol dependence, uncomplicated: Secondary | ICD-10-CM | POA: Diagnosis not present

## 2024-04-14 DIAGNOSIS — Z8546 Personal history of malignant neoplasm of prostate: Secondary | ICD-10-CM | POA: Diagnosis not present

## 2024-04-14 DIAGNOSIS — E871 Hypo-osmolality and hyponatremia: Secondary | ICD-10-CM | POA: Diagnosis not present

## 2024-04-14 DIAGNOSIS — F1027 Alcohol dependence with alcohol-induced persisting dementia: Secondary | ICD-10-CM | POA: Diagnosis not present

## 2024-04-14 DIAGNOSIS — Z23 Encounter for immunization: Secondary | ICD-10-CM | POA: Diagnosis not present

## 2024-04-14 DIAGNOSIS — E7800 Pure hypercholesterolemia, unspecified: Secondary | ICD-10-CM | POA: Diagnosis not present

## 2024-04-14 DIAGNOSIS — Z1331 Encounter for screening for depression: Secondary | ICD-10-CM | POA: Diagnosis not present

## 2024-04-14 DIAGNOSIS — R7989 Other specified abnormal findings of blood chemistry: Secondary | ICD-10-CM | POA: Diagnosis not present

## 2024-04-14 DIAGNOSIS — G40909 Epilepsy, unspecified, not intractable, without status epilepticus: Secondary | ICD-10-CM | POA: Diagnosis not present

## 2024-04-14 DIAGNOSIS — G629 Polyneuropathy, unspecified: Secondary | ICD-10-CM | POA: Diagnosis not present

## 2024-04-21 DIAGNOSIS — H903 Sensorineural hearing loss, bilateral: Secondary | ICD-10-CM | POA: Diagnosis not present

## 2024-04-21 DIAGNOSIS — H6123 Impacted cerumen, bilateral: Secondary | ICD-10-CM | POA: Diagnosis not present

## 2024-04-28 DIAGNOSIS — R3915 Urgency of urination: Secondary | ICD-10-CM | POA: Diagnosis not present

## 2024-04-28 DIAGNOSIS — C61 Malignant neoplasm of prostate: Secondary | ICD-10-CM | POA: Diagnosis not present

## 2024-05-04 DIAGNOSIS — R339 Retention of urine, unspecified: Secondary | ICD-10-CM | POA: Diagnosis not present

## 2024-05-31 DIAGNOSIS — R339 Retention of urine, unspecified: Secondary | ICD-10-CM | POA: Diagnosis not present

## 2024-07-15 ENCOUNTER — Other Ambulatory Visit: Payer: Self-pay | Admitting: Neurology

## 2024-07-17 NOTE — Telephone Encounter (Signed)
 Last seen on 10/15/22 No 1 year follow up scheduled    Dispensed Days Supply Quantity Provider Pharmacy  LAMOTRIGINE  150 MG TABLET 06/09/2024 30 60 each Gregg Lek, MD [id number] [place]      Phone room please call to schedule a follow up visit, pt is very overdue.

## 2024-08-29 ENCOUNTER — Ambulatory Visit: Admitting: Neurology
